# Patient Record
Sex: Female | Born: 1960 | Race: Black or African American | Hispanic: No | Marital: Single | Smoking: Former smoker
Health system: Southern US, Community
[De-identification: ages and names within clinical notes are randomized; demographics above are authoritative.]

## PROBLEM LIST (undated history)

## (undated) DIAGNOSIS — K219 Gastro-esophageal reflux disease without esophagitis: Secondary | ICD-10-CM

## (undated) DIAGNOSIS — M069 Rheumatoid arthritis, unspecified: Secondary | ICD-10-CM

## (undated) DIAGNOSIS — I82409 Acute embolism and thrombosis of unspecified deep veins of unspecified lower extremity: Secondary | ICD-10-CM

## (undated) DIAGNOSIS — K5900 Constipation, unspecified: Secondary | ICD-10-CM

## (undated) DIAGNOSIS — L209 Atopic dermatitis, unspecified: Secondary | ICD-10-CM

## (undated) DIAGNOSIS — M359 Systemic involvement of connective tissue, unspecified: Secondary | ICD-10-CM

## (undated) DIAGNOSIS — E785 Hyperlipidemia, unspecified: Secondary | ICD-10-CM

## (undated) DIAGNOSIS — J45909 Unspecified asthma, uncomplicated: Secondary | ICD-10-CM

## (undated) DIAGNOSIS — G709 Myoneural disorder, unspecified: Secondary | ICD-10-CM

## (undated) DIAGNOSIS — I10 Essential (primary) hypertension: Secondary | ICD-10-CM

## (undated) DIAGNOSIS — J449 Chronic obstructive pulmonary disease, unspecified: Secondary | ICD-10-CM

## (undated) DIAGNOSIS — E119 Type 2 diabetes mellitus without complications: Secondary | ICD-10-CM

## (undated) HISTORY — DX: Hyperlipidemia, unspecified: E78.5

## (undated) HISTORY — PX: TUBAL LIGATION: SHX77

## (undated) HISTORY — DX: Unspecified asthma, uncomplicated: J45.909

## (undated) HISTORY — DX: Gastro-esophageal reflux disease without esophagitis: K21.9

## (undated) HISTORY — DX: Rheumatoid arthritis, unspecified: M06.9

## (undated) HISTORY — DX: Constipation, unspecified: K59.00

## (undated) HISTORY — PX: OTHER SURGICAL HISTORY: SHX169

## (undated) HISTORY — DX: Atopic dermatitis, unspecified: L20.9

## (undated) HISTORY — PX: APPENDECTOMY: SHX54

---

## 2004-03-29 ENCOUNTER — Emergency Department: Payer: Self-pay | Admitting: Internal Medicine

## 2004-05-21 ENCOUNTER — Ambulatory Visit: Payer: Self-pay | Admitting: Surgery

## 2004-05-27 ENCOUNTER — Ambulatory Visit: Payer: Self-pay | Admitting: Surgery

## 2004-05-30 ENCOUNTER — Emergency Department: Payer: Self-pay | Admitting: Emergency Medicine

## 2005-05-04 ENCOUNTER — Ambulatory Visit: Payer: Self-pay | Admitting: Internal Medicine

## 2005-09-18 ENCOUNTER — Emergency Department: Payer: Self-pay | Admitting: General Practice

## 2005-10-09 ENCOUNTER — Emergency Department: Payer: Self-pay | Admitting: Emergency Medicine

## 2006-01-02 ENCOUNTER — Emergency Department: Payer: Self-pay | Admitting: Emergency Medicine

## 2007-01-11 ENCOUNTER — Ambulatory Visit: Payer: Self-pay | Admitting: Family Medicine

## 2007-03-06 ENCOUNTER — Emergency Department: Payer: Self-pay

## 2008-01-01 ENCOUNTER — Ambulatory Visit: Payer: Self-pay

## 2008-02-04 ENCOUNTER — Ambulatory Visit: Payer: Self-pay | Admitting: Internal Medicine

## 2008-03-03 ENCOUNTER — Emergency Department: Payer: Self-pay | Admitting: Emergency Medicine

## 2008-10-11 ENCOUNTER — Emergency Department: Payer: Self-pay | Admitting: Emergency Medicine

## 2009-08-17 DIAGNOSIS — Z79899 Other long term (current) drug therapy: Secondary | ICD-10-CM

## 2009-08-17 DIAGNOSIS — Z7901 Long term (current) use of anticoagulants: Secondary | ICD-10-CM | POA: Insufficient documentation

## 2010-06-22 DIAGNOSIS — F329 Major depressive disorder, single episode, unspecified: Secondary | ICD-10-CM | POA: Insufficient documentation

## 2010-06-22 DIAGNOSIS — E119 Type 2 diabetes mellitus without complications: Secondary | ICD-10-CM | POA: Insufficient documentation

## 2010-06-22 DIAGNOSIS — E1129 Type 2 diabetes mellitus with other diabetic kidney complication: Secondary | ICD-10-CM | POA: Insufficient documentation

## 2011-09-24 ENCOUNTER — Emergency Department: Payer: Self-pay | Admitting: Emergency Medicine

## 2011-10-04 ENCOUNTER — Inpatient Hospital Stay: Payer: Self-pay | Admitting: Internal Medicine

## 2011-10-04 LAB — BASIC METABOLIC PANEL
Anion Gap: 8 (ref 7–16)
BUN: 11 mg/dL (ref 7–18)
Calcium, Total: 8.7 mg/dL (ref 8.5–10.1)
Chloride: 107 mmol/L (ref 98–107)
Co2: 26 mmol/L (ref 21–32)
Creatinine: 0.75 mg/dL (ref 0.60–1.30)
Glucose: 92 mg/dL (ref 65–99)
Osmolality: 280 (ref 275–301)
Potassium: 3.7 mmol/L (ref 3.5–5.1)
Sodium: 141 mmol/L (ref 136–145)

## 2011-10-04 LAB — URINALYSIS, COMPLETE
Bacteria: NONE SEEN
Bilirubin,UR: NEGATIVE
Glucose,UR: NEGATIVE mg/dL (ref 0–75)
Ketone: NEGATIVE
Leukocyte Esterase: NEGATIVE
Nitrite: NEGATIVE
Ph: 5 (ref 4.5–8.0)
Protein: NEGATIVE
RBC,UR: 1 /HPF (ref 0–5)
Specific Gravity: 1.018 (ref 1.003–1.030)
Squamous Epithelial: 1
Transitional Epi: <1
WBC UR: <1 /HPF (ref 0–5)

## 2011-10-04 LAB — CBC
HCT: 39.4 % (ref 35.0–47.0)
HGB: 12.6 g/dL (ref 12.0–16.0)
MCH: 32.5 pg (ref 26.0–34.0)
MCHC: 32 g/dL (ref 32.0–36.0)
MCV: 102 fL — ABNORMAL HIGH (ref 80–100)
Platelet: 305 10*3/uL (ref 150–440)
RBC: 3.88 10*6/uL (ref 3.80–5.20)
RDW: 15 % — ABNORMAL HIGH (ref 11.5–14.5)
WBC: 13.2 10*3/uL — ABNORMAL HIGH (ref 3.6–11.0)

## 2011-10-04 LAB — TROPONIN I: Troponin-I: <0.02 ng/mL

## 2011-10-04 LAB — TSH: Thyroid Stimulating Horm: 1.71 u[IU]/mL

## 2011-10-04 LAB — PROTIME-INR
INR: 1.4
Prothrombin Time: 17.6 secs — ABNORMAL HIGH (ref 11.5–14.7)

## 2011-10-04 LAB — CK TOTAL AND CKMB (NOT AT ARMC)
CK, Total: 349 U/L — ABNORMAL HIGH (ref 21–215)
CK-MB: 2.2 ng/mL (ref 0.5–3.6)

## 2011-10-04 LAB — HEMOGLOBIN A1C: Hemoglobin A1C: 6.3 % (ref 4.2–6.3)

## 2011-10-05 LAB — COMPREHENSIVE METABOLIC PANEL
Albumin: 3.7 g/dL (ref 3.4–5.0)
Alkaline Phosphatase: 88 U/L (ref 50–136)
Anion Gap: 10 (ref 7–16)
BUN: 14 mg/dL (ref 7–18)
Bilirubin,Total: 0.2 mg/dL (ref 0.2–1.0)
Calcium, Total: 9.6 mg/dL (ref 8.5–10.1)
Chloride: 105 mmol/L (ref 98–107)
Co2: 23 mmol/L (ref 21–32)
Creatinine: 0.87 mg/dL (ref 0.60–1.30)
EGFR (African American): >60
EGFR (Non-African Amer.): >60
Glucose: 193 mg/dL — ABNORMAL HIGH (ref 65–99)
Osmolality: 281 (ref 275–301)
Potassium: 4.6 mmol/L (ref 3.5–5.1)
SGOT(AST): 26 U/L (ref 15–37)
SGPT (ALT): 41 U/L
Sodium: 138 mmol/L (ref 136–145)
Total Protein: 8 g/dL (ref 6.4–8.2)

## 2011-10-05 LAB — CBC WITH DIFFERENTIAL/PLATELET
Basophil #: 0 10*3/uL (ref 0.0–0.1)
Basophil %: 0.1 %
Eosinophil #: 0 10*3/uL (ref 0.0–0.7)
Eosinophil %: 0 %
HCT: 40.7 % (ref 35.0–47.0)
HGB: 13 g/dL (ref 12.0–16.0)
Lymphocyte #: 1.3 10*3/uL (ref 1.0–3.6)
Lymphocyte %: 10 %
MCH: 32.3 pg (ref 26.0–34.0)
MCHC: 32 g/dL (ref 32.0–36.0)
MCV: 101 fL — ABNORMAL HIGH (ref 80–100)
Monocyte #: 0.5 x10 3/mm (ref 0.2–0.9)
Monocyte %: 3.7 %
Neutrophil #: 11.5 10*3/uL — ABNORMAL HIGH (ref 1.4–6.5)
Neutrophil %: 86.2 %
Platelet: 317 10*3/uL (ref 150–440)
RBC: 4.04 10*6/uL (ref 3.80–5.20)
RDW: 15 % — ABNORMAL HIGH (ref 11.5–14.5)
WBC: 13.3 10*3/uL — ABNORMAL HIGH (ref 3.6–11.0)

## 2011-10-06 LAB — PROTIME-INR
INR: 1.5
Prothrombin Time: 18.3 secs — ABNORMAL HIGH (ref 11.5–14.7)

## 2011-10-10 LAB — CULTURE, BLOOD (SINGLE)

## 2012-01-11 DIAGNOSIS — L309 Dermatitis, unspecified: Secondary | ICD-10-CM | POA: Insufficient documentation

## 2012-03-11 ENCOUNTER — Emergency Department: Payer: Self-pay | Admitting: Emergency Medicine

## 2012-03-11 LAB — BASIC METABOLIC PANEL
Anion Gap: 7 (ref 7–16)
BUN: 9 mg/dL (ref 7–18)
Calcium, Total: 8.8 mg/dL (ref 8.5–10.1)
Chloride: 106 mmol/L (ref 98–107)
Co2: 26 mmol/L (ref 21–32)
Creatinine: 0.82 mg/dL (ref 0.60–1.30)
EGFR (African American): >60
EGFR (Non-African Amer.): >60
Glucose: 126 mg/dL — ABNORMAL HIGH (ref 65–99)
Osmolality: 278 (ref 275–301)
Potassium: 3.9 mmol/L (ref 3.5–5.1)
Sodium: 139 mmol/L (ref 136–145)

## 2012-03-11 LAB — CBC
HCT: 38.8 % (ref 35.0–47.0)
HGB: 12.7 g/dL (ref 12.0–16.0)
MCH: 31.7 pg (ref 26.0–34.0)
MCHC: 32.8 g/dL (ref 32.0–36.0)
MCV: 97 fL (ref 80–100)
Platelet: 288 10*3/uL (ref 150–440)
RBC: 4.02 10*6/uL (ref 3.80–5.20)
RDW: 15.3 % — ABNORMAL HIGH (ref 11.5–14.5)
WBC: 7.8 10*3/uL (ref 3.6–11.0)

## 2012-03-11 LAB — PRO B NATRIURETIC PEPTIDE: B-Type Natriuretic Peptide: 122 pg/mL (ref 0–125)

## 2012-04-02 DIAGNOSIS — F17200 Nicotine dependence, unspecified, uncomplicated: Secondary | ICD-10-CM | POA: Insufficient documentation

## 2012-04-10 DIAGNOSIS — J45909 Unspecified asthma, uncomplicated: Secondary | ICD-10-CM | POA: Insufficient documentation

## 2012-04-29 ENCOUNTER — Emergency Department: Payer: Self-pay | Admitting: Emergency Medicine

## 2012-04-29 LAB — RAPID INFLUENZA A&B ANTIGENS

## 2012-04-29 LAB — CBC
HCT: 36.8 % (ref 35.0–47.0)
HGB: 11.8 g/dL — ABNORMAL LOW (ref 12.0–16.0)
MCH: 30.9 pg (ref 26.0–34.0)
MCHC: 32.1 g/dL (ref 32.0–36.0)
MCV: 96 fL (ref 80–100)
Platelet: 320 10*3/uL (ref 150–440)
RBC: 3.82 10*6/uL (ref 3.80–5.20)
RDW: 13.7 % (ref 11.5–14.5)
WBC: 6 10*3/uL (ref 3.6–11.0)

## 2012-04-29 LAB — BASIC METABOLIC PANEL
Anion Gap: 10 (ref 7–16)
BUN: 16 mg/dL (ref 7–18)
Calcium, Total: 9.2 mg/dL (ref 8.5–10.1)
Chloride: 100 mmol/L (ref 98–107)
Co2: 28 mmol/L (ref 21–32)
Creatinine: 1.06 mg/dL (ref 0.60–1.30)
EGFR (African American): >60
EGFR (Non-African Amer.): >60
Glucose: 96 mg/dL (ref 65–99)
Osmolality: 277 (ref 275–301)
Potassium: 3.7 mmol/L (ref 3.5–5.1)
Sodium: 138 mmol/L (ref 136–145)

## 2012-04-29 LAB — CK TOTAL AND CKMB (NOT AT ARMC)
CK, Total: 239 U/L — ABNORMAL HIGH (ref 21–215)
CK-MB: 0.8 ng/mL (ref 0.5–3.6)

## 2012-04-29 LAB — TROPONIN I: Troponin-I: <0.02 ng/mL

## 2012-05-05 LAB — CULTURE, BLOOD (SINGLE)

## 2012-08-16 DIAGNOSIS — K5900 Constipation, unspecified: Secondary | ICD-10-CM | POA: Insufficient documentation

## 2012-08-16 DIAGNOSIS — K219 Gastro-esophageal reflux disease without esophagitis: Secondary | ICD-10-CM | POA: Insufficient documentation

## 2012-08-31 DIAGNOSIS — I82409 Acute embolism and thrombosis of unspecified deep veins of unspecified lower extremity: Secondary | ICD-10-CM | POA: Insufficient documentation

## 2012-09-12 DIAGNOSIS — L709 Acne, unspecified: Secondary | ICD-10-CM | POA: Insufficient documentation

## 2012-09-12 DIAGNOSIS — L309 Dermatitis, unspecified: Secondary | ICD-10-CM | POA: Insufficient documentation

## 2012-11-25 ENCOUNTER — Emergency Department: Payer: Self-pay | Admitting: Emergency Medicine

## 2012-11-25 LAB — COMPREHENSIVE METABOLIC PANEL
Albumin: 3.5 g/dL (ref 3.4–5.0)
Alkaline Phosphatase: 98 U/L (ref 50–136)
Anion Gap: 6 — ABNORMAL LOW (ref 7–16)
BUN: 19 mg/dL — ABNORMAL HIGH (ref 7–18)
Bilirubin,Total: 0.2 mg/dL (ref 0.2–1.0)
Calcium, Total: 9.3 mg/dL (ref 8.5–10.1)
Chloride: 102 mmol/L (ref 98–107)
Co2: 29 mmol/L (ref 21–32)
Creatinine: 1.22 mg/dL (ref 0.60–1.30)
EGFR (African American): 59 — ABNORMAL LOW
EGFR (Non-African Amer.): 51 — ABNORMAL LOW
Glucose: 79 mg/dL (ref 65–99)
Osmolality: 275 (ref 275–301)
Potassium: 3.7 mmol/L (ref 3.5–5.1)
SGOT(AST): 50 U/L — ABNORMAL HIGH (ref 15–37)
SGPT (ALT): 63 U/L (ref 12–78)
Sodium: 137 mmol/L (ref 136–145)
Total Protein: 7.4 g/dL (ref 6.4–8.2)

## 2012-11-25 LAB — CBC
HCT: 35.5 % (ref 35.0–47.0)
HGB: 11.9 g/dL — ABNORMAL LOW (ref 12.0–16.0)
MCH: 32.6 pg (ref 26.0–34.0)
MCHC: 33.5 g/dL (ref 32.0–36.0)
MCV: 97 fL (ref 80–100)
Platelet: 384 10*3/uL (ref 150–440)
RBC: 3.66 10*6/uL — ABNORMAL LOW (ref 3.80–5.20)
RDW: 15.7 % — ABNORMAL HIGH (ref 11.5–14.5)
WBC: 8.2 10*3/uL (ref 3.6–11.0)

## 2012-11-25 LAB — TROPONIN I: Troponin-I: <0.02 ng/mL

## 2013-05-01 DIAGNOSIS — J441 Chronic obstructive pulmonary disease with (acute) exacerbation: Secondary | ICD-10-CM | POA: Insufficient documentation

## 2013-05-01 DIAGNOSIS — J449 Chronic obstructive pulmonary disease, unspecified: Secondary | ICD-10-CM | POA: Insufficient documentation

## 2013-05-01 DIAGNOSIS — J069 Acute upper respiratory infection, unspecified: Secondary | ICD-10-CM | POA: Insufficient documentation

## 2014-03-03 ENCOUNTER — Emergency Department: Payer: Self-pay | Admitting: Emergency Medicine

## 2014-03-18 DIAGNOSIS — M545 Low back pain, unspecified: Secondary | ICD-10-CM | POA: Insufficient documentation

## 2014-05-07 ENCOUNTER — Inpatient Hospital Stay: Payer: Self-pay | Admitting: Specialist

## 2014-05-07 LAB — TROPONIN I: Troponin-I: <0.02 ng/mL

## 2014-05-07 LAB — CBC
HCT: 37.4 % (ref 35.0–47.0)
HGB: 12 g/dL (ref 12.0–16.0)
MCH: 31.4 pg (ref 26.0–34.0)
MCHC: 32.1 g/dL (ref 32.0–36.0)
MCV: 98 fL (ref 80–100)
Platelet: 322 10*3/uL (ref 150–440)
RBC: 3.81 10*6/uL (ref 3.80–5.20)
RDW: 14.3 % (ref 11.5–14.5)
WBC: 14.1 10*3/uL — ABNORMAL HIGH (ref 3.6–11.0)

## 2014-05-07 LAB — BASIC METABOLIC PANEL
Anion Gap: 10 (ref 7–16)
BUN: 20 mg/dL — ABNORMAL HIGH (ref 7–18)
Calcium, Total: 8.5 mg/dL (ref 8.5–10.1)
Chloride: 101 mmol/L (ref 98–107)
Co2: 23 mmol/L (ref 21–32)
Creatinine: 1.11 mg/dL (ref 0.60–1.30)
EGFR (African American): >60
EGFR (Non-African Amer.): 55 — ABNORMAL LOW
Glucose: 154 mg/dL — ABNORMAL HIGH (ref 65–99)
Osmolality: 274 (ref 275–301)
Potassium: 3.9 mmol/L (ref 3.5–5.1)
Sodium: 134 mmol/L — ABNORMAL LOW (ref 136–145)

## 2014-05-07 LAB — APTT: Activated PTT: 33.4 secs (ref 23.6–35.9)

## 2014-05-07 LAB — PROTIME-INR
INR: 1.7
Prothrombin Time: 19.7 secs — ABNORMAL HIGH (ref 11.5–14.7)

## 2014-05-08 LAB — CBC WITH DIFFERENTIAL/PLATELET
Basophil #: 0 10*3/uL (ref 0.0–0.1)
Basophil %: 0.1 %
Eosinophil #: 0 10*3/uL (ref 0.0–0.7)
Eosinophil %: 0 %
HCT: 37 % (ref 35.0–47.0)
HGB: 11.8 g/dL — ABNORMAL LOW (ref 12.0–16.0)
Lymphocyte #: 1.3 10*3/uL (ref 1.0–3.6)
Lymphocyte %: 6.9 %
MCH: 31.3 pg (ref 26.0–34.0)
MCHC: 31.9 g/dL — ABNORMAL LOW (ref 32.0–36.0)
MCV: 98 fL (ref 80–100)
Monocyte #: 0.8 x10 3/mm (ref 0.2–0.9)
Monocyte %: 4.3 %
Neutrophil #: 17 10*3/uL — ABNORMAL HIGH (ref 1.4–6.5)
Neutrophil %: 88.7 %
Platelet: 330 10*3/uL (ref 150–440)
RBC: 3.77 10*6/uL — ABNORMAL LOW (ref 3.80–5.20)
RDW: 14.4 % (ref 11.5–14.5)
WBC: 19.1 10*3/uL — ABNORMAL HIGH (ref 3.6–11.0)

## 2014-05-08 LAB — BASIC METABOLIC PANEL
Anion Gap: 8 (ref 7–16)
BUN: 29 mg/dL — ABNORMAL HIGH (ref 7–18)
Calcium, Total: 8.8 mg/dL (ref 8.5–10.1)
Chloride: 102 mmol/L (ref 98–107)
Co2: 26 mmol/L (ref 21–32)
Creatinine: 1.24 mg/dL (ref 0.60–1.30)
EGFR (African American): 58 — ABNORMAL LOW
EGFR (Non-African Amer.): 48 — ABNORMAL LOW
Glucose: 183 mg/dL — ABNORMAL HIGH (ref 65–99)
Osmolality: 282 (ref 275–301)
Potassium: 4.4 mmol/L (ref 3.5–5.1)
Sodium: 136 mmol/L (ref 136–145)

## 2014-05-08 LAB — HEMOGLOBIN A1C: Hemoglobin A1C: 7.2 % — ABNORMAL HIGH (ref 4.2–6.3)

## 2014-05-08 LAB — PROTIME-INR
INR: 2
Prothrombin Time: 22.3 secs — ABNORMAL HIGH (ref 11.5–14.7)

## 2014-05-09 LAB — CBC WITH DIFFERENTIAL/PLATELET
Basophil #: 0.1 10*3/uL (ref 0.0–0.1)
Basophil %: 0.3 %
Eosinophil #: 0 10*3/uL (ref 0.0–0.7)
Eosinophil %: 0.1 %
HCT: 38.5 % (ref 35.0–47.0)
HGB: 12.3 g/dL (ref 12.0–16.0)
Lymphocyte #: 4.1 10*3/uL — ABNORMAL HIGH (ref 1.0–3.6)
Lymphocyte %: 18.5 %
MCH: 31.2 pg (ref 26.0–34.0)
MCHC: 31.9 g/dL — ABNORMAL LOW (ref 32.0–36.0)
MCV: 98 fL (ref 80–100)
Monocyte #: 1.8 x10 3/mm — ABNORMAL HIGH (ref 0.2–0.9)
Monocyte %: 8.4 %
Neutrophil #: 16 10*3/uL — ABNORMAL HIGH (ref 1.4–6.5)
Neutrophil %: 72.7 %
Platelet: 333 10*3/uL (ref 150–440)
RBC: 3.94 10*6/uL (ref 3.80–5.20)
RDW: 14.4 % (ref 11.5–14.5)
WBC: 22 10*3/uL — ABNORMAL HIGH (ref 3.6–11.0)

## 2014-05-09 LAB — PROTIME-INR
INR: 2.3
Prothrombin Time: 25 secs — ABNORMAL HIGH (ref 11.5–14.7)

## 2014-05-12 LAB — CULTURE, BLOOD (SINGLE)

## 2014-06-12 ENCOUNTER — Encounter: Payer: Self-pay | Admitting: Pain Medicine

## 2014-07-01 ENCOUNTER — Encounter: Payer: Self-pay | Admitting: Pain Medicine

## 2014-07-14 DIAGNOSIS — M25562 Pain in left knee: Secondary | ICD-10-CM

## 2014-07-14 DIAGNOSIS — G8929 Other chronic pain: Secondary | ICD-10-CM | POA: Insufficient documentation

## 2014-07-14 DIAGNOSIS — M7918 Myalgia, other site: Secondary | ICD-10-CM | POA: Insufficient documentation

## 2014-08-24 NOTE — H&P (Signed)
PATIENT NAME:  Brenda Santana, Brenda Santana MR#:  [id number] DATE OF BIRTH:  [birth year]  DATE OF ADMISSION:  10/04/2011  ADDENDUM:   PRIMARY CARE PHYSICIAN: [facility]   MEDICATIONS: The patient's medication list is as follows.  1. Iron sulfate 325 mg p.o. daily. 2. Lisinopril 10 mg p.o. daily.  3. Metformin 1 gram p.o. b.i.d. 4. Metoprolol 50 mg p.o. b.i.d. 5. Omeprazole 10 mg p.o. daily. 6. Warfarin 5 mg p.o., 2 tablets on Monday and 7.5 mg tablet other days.   ____________________________ Theodoro Grist, MD rv:cbb D: 10/04/2011 15:14:32 ET T: 10/04/2011 15:20:42 ET JOB#: [id number]  cc: Theodoro Grist, MD, <Dictator> [facility] Kadir Azucena MD ELECTRONICALLY SIGNED 10/04/2011 19:10

## 2014-08-24 NOTE — H&P (Signed)
PATIENT NAME:  Brenda Santana, Brenda Santana MR#:  [id number] DATE OF BIRTH:  Aug 16, 1960  DATE OF ADMISSION:  10/04/2011  PRIMARY CARE PHYSICIAN: [facility]   HISTORY OF PRESENT ILLNESS: Patient is a 54 year old African American female with past medical history significant for history of chronic obstructive pulmonary disease diagnosed approximately three years ago, history of diabetes, hypertension, history of right upper extremity arterial clot on Coumadin therapy, also history of depression who presented to hospital with complaints of shortness of breath. Apparently patient was doing well up until approximately at least two weeks ago when she started having increasing shortness of breath. She stated that she has been short of breath and she cannot even lie down, she has to sit up. She feels rattling in her chest. She has wheezing, coughing, however, does not produce much phlegm. Her phlegm is usually thin and watery, more frothy. She also admits of left lower extremity swelling on and off for at least one week now. She denies any significant chest pain, however, admits of some chest pains as well as abdominal pains whenever she coughs. She states that she cannot even lay down, she chokes or coughs whenever she tries to lay down. She admits of seeing [facility] Emergency Room or primary care physician on Friday where she was given prednisone taper, however, did not improve significantly and presented back to Emergency Room at [facility] here with shortness of breath. In the Emergency Room she was given thirty minute therapy with nebulizers with no significant improvement and hospitalist services were contacted for admission.   PAST MEDICAL HISTORY:  1. History of chronic obstructive pulmonary disease diagnosed approximately three years ago. 2. History of diabetes mellitus. 3. Hypertension. 4. 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   5. History of  depression.  6. History of stable angina status post cardiac catheterization done in 2005 by Dr. Clayborn Bigness. At that time cardiac catheterization revealed normal coronary arteries. Ejection fraction of 65%.   MEDICATIONS:  1. Apparently patient was given some Percocet 7.5 mg/325 mg 1 tablet every six hours as needed.  2. Methocarbamol 750 mg 2 tablets 3 times daily. 3. Ibuprofen 800 mg 3 times daily for some neck discomfort recently.  4. Other medications are unknown.   PAST SURGICAL HISTORY: Right upper extremity deep vein thrombosis removal.   ALLERGIES: None.   FAMILY HISTORY: Hypertension in patient's multiple family members, coronary artery disease in patient's father who also died of congestive heart failure at age of 44, diabetes mellitus in patient's grandfather as well as father. No cancer.   SOCIAL HISTORY: Patient is single, has two children, 42 as well as 42 years old. Used to smoke 1/3 of pack a day since age 5 for 30 years, quit two weeks ago because of shortness of breath. She has been using also cocaine, quit also two weeks ago. She works as Quarry manager. Does not drink any alcohol.   REVIEW OF SYSTEMS: CONSTITUTIONAL: Positive for weight gain, approximately 30 or 40 pounds in the past six months. Some congestion, sinus congestion, coughing chest pains, abdominal pains with cough, five pillow orthopnea, lower extremity swelling, dyspnea on exertion, cough and wheezing and shortness of breath, palpitations in the chest, also constipation. Denies any fevers, chills, fatigue, weakness, pains except as mentioned above or weight loss. EYES: In regards to eyes denies any blurry vision, double vision, glaucoma, cataracts. ENT: Denies any tinnitus, allergies, epistaxis, sinus pain, dentures, difficulty swallowing. RESPIRATORY: Denies any hemoptysis, asthma,  chronic obstructive pulmonary disease. CARDIOVASCULAR: Denies any chest pains, arrhythmias, syncope. GASTROINTESTINAL: Denies any nausea,  vomiting, diarrhea, rectal bleeding, change in bowel habits. GENITOURINARY: Denies dysuria, hematuria, frequency, incontinence. ENDOCRINOLOGY: Denies any polydipsia, nocturia, thyroid problems, heat or cold intolerance or thirst. HEMATOLOGY: Denies any anemia, easy bruising, bleeding, swollen glands. SKIN: Denies any acne, rashes, lesions, change in moles. MUSCULOSKELETAL: Denies arthritis, cramps, swelling, gout. NEUROLOGIC: No numbness, epilepsy, tremor. PSYCHIATRIC: Denies anxiety, insomnia, depression.   PHYSICAL EXAMINATION:  VITAL SIGNS: On arrival to the hospital: Temperature 97, pulse 71, respiration rate 24, blood pressure 125/80, saturation 100% on room air.   GENERAL: This is a well-nourished, obese African American female in moderate to severe respiratory distress. She is able to sit only upright even leaning forward because of significant shortness of breath.    HEENT: Her pupils are equal, reactive to light. Extraocular movements are intact. No icterus or conjunctivitis. Has normal hearing. No pharyngeal erythema. Mucosa is moist.   NECK: No masses. Thyroid not enlarged. No adenopathy or JVD or carotid bruits bilaterally. Full range of motion.   LUNGS: Clear to auscultation anteriorly though severely diminished breath sounds. Few rhonchi as well as rales were heard, however, mildly diminished breath sounds posteriorly. Labored inspirations as well as wheezing bilaterally and rales and rhonchi as well as increased effort to breathe but no dullness to percussion in moderate respiratory distress especially posteriorly noted.   CARDIOVASCULAR: S1, S2 appreciated. No murmurs, gallops, rubs noted. Distant. PMI not lateralized. Chest is nontender to palpation.   EXTREMITIES: 1+ pedal pulses. 1 to 2+ lower extremity edema bilaterally. No calf tenderness or cyanosis was noted.   ABDOMEN: Soft, protuberant, nontender. Bowel sounds are present. No hepatosplenomegaly or masses were noted.    RECTAL: Deferred.   MUSCULOSKELETAL: Able to move all extremities. No cyanosis, degenerative joint disease, or kyphosis. Gait is not tested.   SKIN: Skin did not reveal any rashes, lesions, erythema. Few papular lesions were noted in her arms. Skin otherwise was warm and dry to palpation.   LYMPH: No adenopathy in cervical region.   NEUROLOGICAL: Cranial nerves grossly intact. Sensory is intact. No dysarthria, aphasia.   PSYCH: Patient is alert, oriented to time, person, place, cooperative. Memory is good. No syndrome confusion, agitation, or depression noted.   LABORATORY, DIAGNOSTIC, AND RADIOLOGICAL DATA: BMP was within normal limits. Cardiac enzymes CK total was 349, otherwise unremarkable. White blood cell count was elevated to 13.2, hemoglobin 12.6, platelet count 305. MCV is high at 102. EKG showed normal sinus rhythm at 73 beats per minute, normal axis, no acute ST-T changes. Chest x-ray according to radiologist PA and lateral 10/04/2011: No acute changes identified. There is mild cardiomegaly, minimal discoid atelectasis on the left was noted.   ASSESSMENT AND PLAN:  1. COPD  exacerbation. Admit patient to medical floor. Continue Solu-Medrol as well as Levaquin, inhalation therapy as well as Advair and tiotropium. Follow patient clinically.  2. Acute bronchitis. Continue Levaquin. Get sputum cultures as well as blood cultures.  3. Lower extremity swelling, questionable right-sided heart failure. Get ultrasound of her lower extremities to rule out deep vein thrombosis, however, patient is on Coumadin therapy so will get pro time as well as INR. Patient would benefit, however, to evaluate patient for nocturnal oximetry to rule out obstructive sleep apnea.  4. Cocaine abuse. Get echocardiogram to rule out cardiomyopathy.  5. Tobacco abuse. Patient states quit two weeks ago, however, her quitting coincides with worsening shortness of breath. I am concerned  that in fact she quit because  she was short of breath and she will have problems with nicotine withdrawal as soon as her shortness of breath improves so will be starting nicotine replacement therapy. That was discussed with patient for approximately five minutes.  6. Diabetes mellitus. Will restart her on outpatient medications. Meanwhile will continue sliding scale insulin.  7. History of hypertension. Will start patient on lisinopril as well as metoprolol while she is in the hospital, advance them as necessary.   TIME SPENT: 50 minutes.   ____________________________ Theodoro Grist, MD rv:cms D: 10/04/2011 14:32:57 ET T: 10/04/2011 15:36:19 ET JOB#: [id number]  cc: Theodoro Grist, MD, <Dictator> [facility] Internal Medicine  Kendre Sires MD ELECTRONICALLY SIGNED 10/04/2011 19:11

## 2014-08-24 NOTE — Discharge Summary (Signed)
PATIENT NAME:  Brenda Santana, Brenda Santana MR#:  [id number] DATE OF BIRTH:  Sep 03, 1960  DATE OF ADMISSION:  10/04/2011 DATE OF DISCHARGE:  10/06/2011  PRESENTING COMPLAINT: Shortness of breath.   DISCHARGE DIAGNOSES:  1. Chronic obstructive pulmonary disease exacerbation.  2. Hypertension.  3. Tobacco abuse.   CONDITION ON DISCHARGE: Fair. Sats 98% on room air.   MEDICATIONS:  1. Ferrous sulfate 325 p.o. daily.  2. Omeprazole 20 mg daily.  3. Metoprolol 50 mg b.i.d.  4. Lisinopril 10 mg daily.  5. Metformin 1000 mg b.i.d.  6. Warfarin 10 mg every Monday at 5 p.m.  7. Warfarin 7.5 mg on Tuesday, Wednesday, Thursday, Friday, and Saturday at 5 p.m.  8. Prednisone 50 mg daily, taper by 10 mg daily, then stop.  9. Advair 500/50 1 puff b.i.d.  10. Tiotropium 18 mcg inhalation 1 capsule inhalation daily.  11. Levaquin 500 mg p.o. daily for five days.   FOLLOW-UP: Follow-up with [facility] primary care physician in 1 to 2 weeks.   LABORATORY, DIAGNOSTIC, AND RADIOLOGICAL DATA: White count 13.3, hemoglobin and hematocrit 13 and 40.7, platelet count 317. Comprehensive metabolic panel within normal limits except glucose of 193.   Ultrasound Doppler lower extremities bilaterally negative for DVT.   Urinalysis negative for urinary tract infection. Blood cultures no growth in 48 hours.   Chest x-ray no acute changes identified on admission.   Cardiac enzymes negative. Hemoglobin A1c 6.3. TSH 1.7.   BRIEF SUMMARY OF HOSPITAL COURSE: [name] is a 55 year old African American female who was admitted with: 1. Acute COPD exacerbation. She was started on high dose IV Solu-Medrol, empiric p.o. Levaquin, nebulizer treatments along with her oral inhalers. She improved clinically over the hospital stay. She will finish up the steroids as a p.o. taper along with antibiotics. Sats on room air were 98% prior to discharge.  2. Acute bronchitis. Blood cultures were negative. Chest x-ray negative for pneumonia. Levaquin  was prescribed.  3. Lower extremity swelling which appeared stable. No DVT per Doppler.  4. History of substance abuse, cocaine x1. The patient advised to stop. She is agreeable to it.  5. Tobacco abuse. The patient was advised to quit smoking. The patient had quit smoking about two weeks ago.  6. Type II diabetes. The patient was continued on her metformin. Her A1c is 6.3.  7. History of right upper extremity blood clot status post thrombectomy, on Coumadin. The patient's Coumadin was continued according to her home dose. Her PT-INR were therapeutic.   Hospital stay otherwise remained stable.   CODE STATUS: The patient remained a FULL CODE.   TIME SPENT: 40 minutes.   ____________________________ Hart Rochester Posey Pronto, MD sap:drc D: 10/08/2011 13:52:26 ET T: 10/10/2011 11:36:46 ET JOB#: [id number]  cc: Corry Storie A. Posey Pronto, MD, <Dictator> [facility] Internal Medicine Ilda Basset MD ELECTRONICALLY SIGNED 10/22/2011 15:40

## 2014-08-31 NOTE — H&P (Signed)
PATIENT NAME:  Brenda Santana, Brenda Santana MR#:  [id number] DATE OF BIRTH:  08-19-1960  DATE OF ADMISSION:  05/07/2014  PRIMARY CARE PHYSICIAN:  Dr. Mayer Masker at [facility].  REFERRING EMERGENCY ROOM PHYSICIAN:  Larae Grooms, MD    CHIEF COMPLAINT: Shortness of breath and cough.   HISTORY OF PRESENT ILLNESS: The patient is a 54 year old African American female who came into the ED with a chief complaint of worsening of shortness of breath. The patient has been having productive cough and shortness of breath for more than 3 days and was evaluated by [facility] and was admitted there overnight for acute bronchitis. The patient was discharged home the next day with p.o. prednisone. The patient denies any fever, but still feeling short of breath.  She has been progressively getting worse. The patient came in to the ED here.  Her chest x-ray revealed left lower lobe pneumonia.  She was given IV Rocephin and Zithromax by the ED physician and the hospitalist team is contacted to admit the patient.  During my examination, the patient is feeling slightly better, but still short of breath with minimal exertion. Denies any chest pain. No family members at bedside.   PAST MEDICAL HISTORY: Hypertension, diabetes mellitus, left upper extremity deep vein thrombosis on Coumadin, COPD, obesity, history of depression, history of angina status post cardiac catheterization in 2005 by Dr. Clayborn Bigness, ejection fraction of 65%.   PAST SURGICAL HISTORY: Right upper extremity DVT removal, tubal ligation.   ALLERGIES: No known drug allergies.   PSYCHOSOCIAL HISTORY: Lives at home with her daughter. She used to smoke, but quit smoking several years ago. Denies alcohol or illicit drug usage.   FAMILY HISTORY: Diabetes and hypertension runs in her family.   REVIEW OF SYSTEMS:   CONSTITUTIONAL:  Denies any fever. Complaining of fatigue and weakness.  EYES: Denies blurry vision, double vision.  ENT: Denies epistaxis,  discharge, has been complaining of cough which is productive, chronic history of COPD, denies any asthma.  CARDIOVASCULAR: No chest or palpitations.  GASTROINTESTINAL: Denies nausea, vomiting, diarrhea, abdominal pain.  GENITOURINARY: No dysuria, hematuria.  ENDOCRINE:  Denies polyuria, nocturia, thyroid problems.  HEMATOLOGIC AND LYMPHATIC: No anemia, easy bruising, bleeding.  Left upper extremity DVT on Coumadin.  INTEGUMENTARY:  No acne, rash, denies lesions.  MUSCULOSKELETAL: No joint pain in the neck and back.  NEUROLOGIC: Denies vertigo, ataxia.  PSYCHIATRIC:  History of depression. Denies any suicidal or homicidal ideation.   HOME MEDICATIONS: Coumadin 5 mg 1-1/2 tablets p.o. once daily, tramadol 50 mg p.o. 1 to 2 tablets every 4 to 6 hours as needed, pregabalin 50 mg 1 capsule 2 times a day, prednisone 20 mg 2 tablets p.o. once daily, nystatin topical to the affected area, naproxen 500 mg 1 tablet p.o. b.i.d., metformin 1000 mg p.o. b.i.d., lisinopril 10 mg p.o. once daily, diclofenac apply topically to the affected area, Chlorthalidone 25 mg 1 tablet p.o. once daily.  PHYSICAL EXAMINATION:  VITAL SIGNS:  Temperature 97.1, pulse 79, respirations 18, blood pressure 118/64, pulse oximetry 93%.  GENERAL APPEARANCE: Not in acute distress, morbidly obese.  HEENT: Normocephalic, atraumatic. Pupils are equally reacting to light and accommodation. No scleral icterus. No conjunctival injection. No sinus tenderness. No postnasal drip.  NECK: Supple. No JVD. No thyromegaly. Range of motion is intact.  LUNGS: Bilateral wheezing is present. Positive crackles in the left lower lobe, no accessory muscle usage.  CARDIOVASCULAR: S1 S2 normal, regular rate and rhythm.  No murmurs. GASTROINTESTINAL: Soft.  Bowel sounds are positive in all 4 quadrants. Nontender, nondistended, obese, no masses.  NEUROLOGIC: Awake, alert and oriented x 3. Cranial nerves II through XII are grossly intact. Motor and sensory  are intact. Reflexes are 2+. EXTREMITIES: No edema. No cyanosis. No clubbing. SKIN: Warm to touch. Normal turgor. No rashes. No lesions.  MUSCULOSKELETAL: No joint effusion, tenderness, or edema.  PSYCHIATRIC:  Normal mood and affect.   LABORATORY AND IMAGING STUDIES: Troponin less than 0.02. WBC 14.1, hemoglobin, hematocrit, platelets are normal. PT is 19.7, INR 1.7.  PTT is normal. Glucose 154, BUN 20, creatinine 1.11, sodium 134, potassium 3.9, chloride 101, CO2 of 23, anion gap is 10, GFR greater than 60. Serum osmolality, calcium are normal. A 12-lead EKG: Normal sinus rhythm at 74 beats per minute, unlikely acute ST-T wave changes. Chest x-ray PA and lateral views, streaky left lower lobe opacity suspicious for pneumonia, recommend chest radiograph follow up in several weeks to acess resolution.   ASSESSMENT AND PLAN: An 54 year old African American female presenting to the ED with a chief complaint of a 3-day history of shortness of breath and productive cough evaluated by [facility], got admitted there with  acute bronchitis, was treated for 1 day and was discharged home with p.o. prednisone.  The patient is still having worsening of shortness of breath which prompted her to come to the Emergency Department here. In the Emergency Department at [facility], chest x-ray revealed a left lower lobe pneumonia. The patient was given IV Solu-Medrol, antibiotics and hospitalist team is called to admit the patient.  1.  Acute respiratory distress secondary to left lower lobe pneumonia with acute bronchitis. Will admit her to medical surgical unit. Will treat her with IV antibiotics.  Sputum cultures were ordered.  We will provide her DuoNeb treatments. 2.  Acute exacerbation of chronic obstructive pulmonary disease with bronchitis. Will treat her with IV Solu-Medrol 60 mg every 6 hours, 125 mg IV was given in the Emergency Department. We will provide her nebulizer treatments, DuoNebs and  also will give her Flonase for nasal congestion.  3.  Diabetes mellitus, patient will be on metformin, which is her home medication and also sliding-scale insulin for steroid-induced hyperglycemia.  4.  Hypertension. Resume her home medications, including lisinopril and hydrochlorothiazide, titrate medication as needed basis.  5.  History of left upper extremity deep vein thrombosis, status post resection, also on Coumadin. Apparently, INR is at 1.7 which is subtherapeutic.  We will resume Coumadin dose and resume daily PT, INRs and titrate coumadin as needed basis.  6.  Chronic history of depression.  Denies any suicidal ideations or thoughts at this point, but not acutely depressed.  We will resume her home medication.  7.  We will provide her deep vein thrombosis prophylaxis.    Plan of care and diagnosis was discussed in detail with the patient. She verbalized understanding of the plan.   TOTAL TIME SPENT ADMISSION: 45 minutes.   CODE STATUS:  She is FULL CODE.  Mom is the medical power of attorney.     ____________________________ Nicholes Mango, MD ag:DT D: 05/07/2014 14:11:43 ET T: 05/07/2014 15:35:48 ET JOB#: [id number]  cc: Nicholes Mango, MD, <Dictator> Nicholes Mango MD ELECTRONICALLY SIGNED 05/12/2014 23:03

## 2014-08-31 NOTE — Discharge Summary (Signed)
PATIENT NAME:  Brenda Santana, Brenda Santana MR#:  [id number] DATE OF BIRTH:  09-15-1960  DATE OF ADMISSION:  05/07/2014 DATE OF DISCHARGE:  05/09/2014  For a detailed note, please see the history and physical done on admission per Dr. Margaretmary Eddy.   DIAGNOSIS AT DISCHARGE AS FOLLOWS: Chronic obstructive pulmonary disease exacerbation, secondary to pneumonia; pneumonia, likely community acquired; diabetes, hypertension, osteoarthritis, hyperlipidemia.   The patient is being discharged on a low-sodium, low-fat, carb-controlled diet; activities as tolerated, and follow-up with her primary care physician at [facility].   DISCHARGE MEDICATIONS: Metoprolol tartrate 50 mg b.i.d., lisinopril 10 mg daily, metformin 1000 mg b.i.d., albuterol inhaler 2 puffs 4 times daily as needed, Anusol suppository daily as needed, bupropion 200 mg b.i.d., chlorthalidone 25 mg daily, clobetasol 0.05% topical cream applied b.i.d., diclofenac 1% topical gel to be applied q.i.d., Lyrica 50 mg b.i.d., tramadol 50 mg 1-2 tabs q. 4-6 hours as needed, tretinoin topical cream to be applied to the affected area at bedtime, Naprosyn 5 mg b.i.d., nystatin topically to be applied q.i.d. as needed, warfarin 5 mg 1-1/2 tablets daily, prednisone taper starting at 50 mg and down to 10 mg in the next 5 days, Levaquin 5 mg daily x 5 days, and Tylenol with hydrocodone 1 tablet q. 4 hours as needed.   PERTINENT STUDIES DONE DURING THE HOSPITAL COURSE: Chest x-ray done on admission showing streaky left lower lobe opacity, suspicious for pneumonia.   HOSPITAL COURSE: This is a 54 year old female with medical problems as mentioned above, who presented to the hospital with shortness of breath and cough, and noted to be in COPD exacerbation.  1.  COPD exacerbation. This would likely cause of patient's shortness of breath and cough, and is likely secondary to pneumonia. The patient apparently had failed outpatient therapy with oral Zithromax and prednisone taper. Therefore,  was admitted to the hospital, started on IV steroids, around-the-clock nebulizer treatments. Also started on IV ceftriaxone and Zithromax. The patient's clinical symptoms, after getting aggressive therapy has significantly improved. She has less wheezing and bronchospasm now. She was ambulated on room air, and did not desaturate below 95%. Clinically feels better. Therefore, being discharged home on an oral prednisone taper along with Levaquin as stated.  2.  Pneumonia. This was likely community-acquired, as seen on the chest x-ray on admission. Her blood cultures have remained negative. She was treated with IV ceftriaxone and Zithromax. She is currently being discharged on oral Levaquin.  3.  History of DVT. The patient was maintained on her Coumadin. Her INR is therapeutic. She will continue that.  4.  Diabetes. The patient was maintained on metformin and sliding scale insulin. She will continue her metformin upon discharge.  5.  Leukocytosis. Her white cell count was significantly elevated on discharge, but I suspect this is all steroid mediated. This further needs to be followed as an outpatient.  6.  Diabetic neuropathy. The patient will continue her Lyrica.  7.  Osteoarthritis. The patient will continue with diclofenac and Naprosyn.   CODE STATUS: The patient is a Full Code.   TIME SPENT: 40 minutes.   ____________________________ Belia Heman. Verdell Carmine, MD vjs:MT D: 05/09/2014 16:26:29 ET T: 05/09/2014 16:52:11 ET JOB#: [id number]  cc: Belia Heman. Verdell Carmine, MD, <Dictator> PCP at [facility] MD ELECTRONICALLY SIGNED 05/27/2014 10:40

## 2015-06-07 ENCOUNTER — Emergency Department
Admission: EM | Admit: 2015-06-07 | Discharge: 2015-06-07 | Disposition: A | Discharge status: Home / Self Care | Payer: Medicare Other | Attending: Emergency Medicine | Admitting: Emergency Medicine

## 2015-06-07 ENCOUNTER — Encounter: Payer: Self-pay | Admitting: Emergency Medicine

## 2015-06-07 DIAGNOSIS — R040 Epistaxis: Secondary | ICD-10-CM | POA: Diagnosis present

## 2015-06-07 DIAGNOSIS — E119 Type 2 diabetes mellitus without complications: Secondary | ICD-10-CM | POA: Insufficient documentation

## 2015-06-07 DIAGNOSIS — I10 Essential (primary) hypertension: Secondary | ICD-10-CM | POA: Diagnosis not present

## 2015-06-07 DIAGNOSIS — Z87891 Personal history of nicotine dependence: Secondary | ICD-10-CM | POA: Insufficient documentation

## 2015-06-07 HISTORY — DX: Chronic obstructive pulmonary disease, unspecified: J44.9

## 2015-06-07 HISTORY — DX: Type 2 diabetes mellitus without complications: E11.9

## 2015-06-07 HISTORY — DX: Essential (primary) hypertension: I10

## 2015-06-07 LAB — CBC WITH DIFFERENTIAL/PLATELET
Basophils Absolute: 0 10*3/uL (ref 0–0.1)
Basophils Relative: 1 %
Eosinophils Absolute: 0.4 10*3/uL (ref 0–0.7)
Eosinophils Relative: 4 %
HCT: 38.1 % (ref 35.0–47.0)
Hemoglobin: 12.7 g/dL (ref 12.0–16.0)
Lymphocytes Relative: 38 %
Lymphs Abs: 3.5 10*3/uL (ref 1.0–3.6)
MCH: 31.1 pg (ref 26.0–34.0)
MCHC: 33.3 g/dL (ref 32.0–36.0)
MCV: 93.5 fL (ref 80.0–100.0)
Monocytes Absolute: 0.5 10*3/uL (ref 0.2–0.9)
Monocytes Relative: 5 %
Neutro Abs: 4.9 10*3/uL (ref 1.4–6.5)
Neutrophils Relative %: 52 %
Platelets: 331 10*3/uL (ref 150–440)
RBC: 4.08 MIL/uL (ref 3.80–5.20)
RDW: 14.9 % — ABNORMAL HIGH (ref 11.5–14.5)
WBC: 9.3 10*3/uL (ref 3.6–11.0)

## 2015-06-07 LAB — PROTIME-INR
INR: 2.79
Prothrombin Time: 29 seconds — ABNORMAL HIGH (ref 11.4–15.0)

## 2015-06-07 LAB — APTT: aPTT: 45 s — ABNORMAL HIGH (ref 24–36)

## 2015-06-07 MED ORDER — OXYMETAZOLINE HCL 0.05 % NA SOLN
2.0000 | Freq: Once | NASAL | Status: AC
  Administered 2015-06-07: 2 via NASAL
  Filled 2015-06-07: qty 15

## 2015-06-07 MED ORDER — SILVER NITRATE-POT NITRATE 75-25 % EX MISC
1.0000 | Freq: Once | CUTANEOUS | Status: AC
  Administered 2015-06-07: 1 via TOPICAL
  Filled 2015-06-07: qty 1

## 2015-06-07 MED ORDER — OXYMETAZOLINE HCL 0.05 % NA SOLN
NASAL | Status: AC
  Administered 2015-06-07: 2 via NASAL
  Filled 2015-06-07: qty 15

## 2015-06-07 MED ORDER — SILVER NITRATE-POT NITRATE 75-25 % EX MISC
CUTANEOUS | Status: AC
  Administered 2015-06-07: 1 via TOPICAL
  Filled 2015-06-07: qty 1

## 2015-06-07 NOTE — ED Notes (Signed)
Pt states that her rt nare started bleeding at church today, states that her nose felt full of mucous and she blew it and then her nose started to bleed worse, pt reports taking blood thinners

## 2015-06-07 NOTE — ED Notes (Signed)
Pt reports bleeding has stop, applied clamp to nose

## 2015-06-07 NOTE — ED Provider Notes (Addendum)
[facility] Emergency Department Provider Note  ____________________________________________   I have reviewed the triage vital signs and the nursing notes.   HISTORY  Chief Complaint Epistaxis    HPI Brenda Santana is a 55 y.o. female on Coumadin for a history of blood clots prevents today withintermittent nosebleed for the last 5 hours. He'll, and go. It happened at church and then went away and then happened again at her daughter's house and went away. It is from the anterior right now. She does not feel a lot of blood going down her throat. Last INR check was 2.4 she believes. She has had no lightheadedness or easy bruisability or rectal bleeding.  Past Medical History  Diagnosis Date  . Hypertension   . Diabetes mellitus without complication ([place])   . COPD (chronic obstructive pulmonary disease) ([facility])     There are no active problems to display for this patient.   Past Surgical History  Procedure Laterality Date  . Tubal ligation      No current outpatient prescriptions on file.  Allergies Review of patient's allergies indicates not on file.  No family history on file.  Social History Social History  Substance Use Topics  . Smoking status: Former Research scientist (life sciences)  . Smokeless tobacco: None  . Alcohol Use: No    Review of Systems See history of present illness otherwise negative  ____________________________________________   PHYSICAL EXAM:  VITAL SIGNS: ED Triage Vitals  Enc Vitals Group     BP 06/07/15 1703 112/58 mmHg     Pulse Rate 06/07/15 1703 79     Resp 06/07/15 1703 18     Temp 06/07/15 1703 97.5 F (36.4 C)     Temp Source 06/07/15 1703 Oral     SpO2 06/07/15 1703 97 %     Weight 06/07/15 1703 263 lb (119.296 kg)     Height 06/07/15 1703 5\' 5"  (1.651 m)     Head Cir --      Peak Flow --      Pain Score --      Pain Loc --      Pain Edu? --      Excl. in Saline? --     Constitutional: Alert and oriented. Well appearing  and in no acute distress. Eyes: Conjunctivae are normal. PERRL. EOMI. Head: Atraumatic. Nose: There is blood in the right and air anteriorly there is no evidence of blood going down the throat at this time. No active bleeding noted at this moment. Mouth/Throat: Mucous membranes are moist.  Oropharynx non-erythematous. Neck: No stridor.   Nontender with no meningismus Cardiovascular: Normal rate, regular rhythm. Grossly normal heart sounds.  Good peripheral circulation.  Skin:  Skin is warm, dry and intact. No rash noted. Psychiatric: Mood and affect are normal. Speech and behavior are normal.  ____________________________________________   LABS (all labs ordered are listed, but only abnormal results are displayed)  Labs Reviewed  CBC WITH DIFFERENTIAL/PLATELET  PROTIME-INR  APTT   ____________________________________________  EKG  I personally interpreted any EKGs ordered by me or triage  ____________________________________________  RADIOLOGY  I reviewed any imaging ordered by me or triage that were performed during my shift ____________________________________________   PROCEDURES  Procedure(s) performed: After Afrin, patient didn't know bleeding for over an hour and a half in the emergency department. I was able to use the speculum to visualize the septum on the right side. There is some small clot there which I was able to cauterize. I  cannot guarantee I got the exact spot. Patient tolerated the procedure well complications. No bleeding despite sneezing after the procedure there is no evidence of bleeding. No evidence of posterior bleed.   Critical Care performed: None  ____________________________________________   INITIAL IMPRESSION / ASSESSMENT AND PLAN / ED COURSE  Pertinent labs & imaging results that were available during my care of the patient were reviewed by me and considered in my medical decision making (see chart for details).  Patient with a nosebleed  off and on today and Coumadin will check a CBC for platelets and we will check her baseline H&H we'll check INR level. I'm giving her Afrin in her nose and we'll apply pressure pending further investigation. Not actively bleeding at this moment.  ----------------------------------------- 6:26 PM on 06/07/2015 -----------------------------------------  We discussed the need to return for any recurrent bleeding that she cannot control with Afrin and external pressure. No evidence of ongoing bleeding no evidence of posterior bleed. INR is somewhat high at 2.79. We will have the patient hold off her Coumadin tonight. And have it rechecked tomorrow. Return precautions and follow-up given and understood and I'll have her follow up with ENT ____________________________________________   FINAL CLINICAL IMPRESSION(S) / ED DIAGNOSES  Final diagnoses:  None      This chart was dictated using voice recognition software.  Despite best efforts to proofread,  errors can occur which can change meaning.     Schuyler Amor, MD 06/07/15 [place], MD 06/07/15 [phone]

## 2015-06-07 NOTE — Discharge Instructions (Signed)
I recommend holding Coumadin tonight and not taking it. INR is 2.79 which is somewhat high. If you have recurrent bleeding use the Afrin and hold pressure as we discussed. If it continues or seems to be significant including bleeding down her throat or you feel lightheaded return to the emergency room. Follow closely with ENT as an outpatient. Discuss with your doctor tomorrow.

## 2015-07-01 ENCOUNTER — Other Ambulatory Visit: Payer: Self-pay | Admitting: Physician Assistant

## 2015-07-01 ENCOUNTER — Ambulatory Visit
Admission: RE | Admit: 2015-07-01 | Discharge: 2015-07-01 | Disposition: A | Discharge status: Home / Self Care | Payer: Medicare Other | Source: Ambulatory Visit | Attending: Physician Assistant | Admitting: Physician Assistant

## 2015-07-01 DIAGNOSIS — R059 Cough, unspecified: Secondary | ICD-10-CM

## 2015-07-01 DIAGNOSIS — J9811 Atelectasis: Secondary | ICD-10-CM | POA: Insufficient documentation

## 2015-07-01 DIAGNOSIS — R05 Cough: Secondary | ICD-10-CM

## 2015-07-01 DIAGNOSIS — I517 Cardiomegaly: Secondary | ICD-10-CM | POA: Diagnosis not present

## 2015-08-05 ENCOUNTER — Other Ambulatory Visit: Payer: Self-pay | Admitting: Internal Medicine

## 2015-08-05 DIAGNOSIS — R0602 Shortness of breath: Secondary | ICD-10-CM

## 2015-08-18 ENCOUNTER — Ambulatory Visit: Admission: RE | Admit: 2015-08-18 | Payer: Medicare Other | Source: Ambulatory Visit

## 2015-08-27 ENCOUNTER — Ambulatory Visit
Admission: RE | Admit: 2015-08-27 | Discharge: 2015-08-27 | Disposition: A | Discharge status: Home / Self Care | Payer: Medicare Other | Source: Ambulatory Visit | Attending: Internal Medicine | Admitting: Internal Medicine

## 2015-08-27 DIAGNOSIS — R0602 Shortness of breath: Secondary | ICD-10-CM | POA: Insufficient documentation

## 2015-08-27 DIAGNOSIS — R918 Other nonspecific abnormal finding of lung field: Secondary | ICD-10-CM | POA: Insufficient documentation

## 2015-08-27 DIAGNOSIS — J449 Chronic obstructive pulmonary disease, unspecified: Secondary | ICD-10-CM | POA: Diagnosis present

## 2015-08-27 DIAGNOSIS — K760 Fatty (change of) liver, not elsewhere classified: Secondary | ICD-10-CM | POA: Insufficient documentation

## 2015-08-27 LAB — POCT I-STAT CREATININE: Creatinine, Ser: 0.9 mg/dL (ref 0.44–1.00)

## 2015-08-27 MED ORDER — IOPAMIDOL (ISOVUE-300) INJECTION 61%
75.0000 mL | Freq: Once | INTRAVENOUS | Status: AC | PRN
  Administered 2015-08-27: 75 mL via INTRAVENOUS

## 2015-09-20 ENCOUNTER — Encounter: Payer: Self-pay | Admitting: Emergency Medicine

## 2015-09-20 ENCOUNTER — Emergency Department
Admission: EM | Admit: 2015-09-20 | Discharge: 2015-09-20 | Disposition: A | Discharge status: Home / Self Care | Payer: Medicare Other | Attending: Emergency Medicine | Admitting: Emergency Medicine

## 2015-09-20 ENCOUNTER — Emergency Department: Payer: Medicare Other

## 2015-09-20 DIAGNOSIS — Z7984 Long term (current) use of oral hypoglycemic drugs: Secondary | ICD-10-CM | POA: Diagnosis not present

## 2015-09-20 DIAGNOSIS — Z87891 Personal history of nicotine dependence: Secondary | ICD-10-CM | POA: Insufficient documentation

## 2015-09-20 DIAGNOSIS — Z7901 Long term (current) use of anticoagulants: Secondary | ICD-10-CM | POA: Diagnosis not present

## 2015-09-20 DIAGNOSIS — E119 Type 2 diabetes mellitus without complications: Secondary | ICD-10-CM | POA: Diagnosis not present

## 2015-09-20 DIAGNOSIS — M79674 Pain in right toe(s): Secondary | ICD-10-CM | POA: Diagnosis present

## 2015-09-20 DIAGNOSIS — J449 Chronic obstructive pulmonary disease, unspecified: Secondary | ICD-10-CM | POA: Diagnosis not present

## 2015-09-20 DIAGNOSIS — M109 Gout, unspecified: Secondary | ICD-10-CM | POA: Insufficient documentation

## 2015-09-20 DIAGNOSIS — I10 Essential (primary) hypertension: Secondary | ICD-10-CM | POA: Insufficient documentation

## 2015-09-20 HISTORY — DX: Acute embolism and thrombosis of unspecified deep veins of unspecified lower extremity: I82.409

## 2015-09-20 LAB — CBC WITH DIFFERENTIAL/PLATELET
Band Neutrophils: 0 %
Basophils Absolute: 0.1 10*3/uL (ref 0–0.1)
Basophils Relative: 1 %
Blasts: 0 %
Eosinophils Absolute: 0.2 10*3/uL (ref 0–0.7)
Eosinophils Relative: 2 %
HCT: 36.9 % (ref 35.0–47.0)
Hemoglobin: 12.2 g/dL (ref 12.0–16.0)
Lymphocytes Relative: 25 %
Lymphs Abs: 2.6 10*3/uL (ref 1.0–3.6)
MCH: 31.4 pg (ref 26.0–34.0)
MCHC: 33.2 g/dL (ref 32.0–36.0)
MCV: 94.6 fL (ref 80.0–100.0)
Metamyelocytes Relative: 0 %
Monocytes Absolute: 1 10*3/uL — ABNORMAL HIGH (ref 0.2–0.9)
Monocytes Relative: 10 %
Myelocytes: 0 %
Neutro Abs: 6.5 10*3/uL (ref 1.4–6.5)
Neutrophils Relative %: 62 %
Other: 0 %
Platelets: 286 10*3/uL (ref 150–440)
Promyelocytes Absolute: 0 %
RBC: 3.9 MIL/uL (ref 3.80–5.20)
RDW: 14.7 % — ABNORMAL HIGH (ref 11.5–14.5)
WBC: 10.4 10*3/uL (ref 3.6–11.0)
nRBC: 0 /100 WBC

## 2015-09-20 LAB — BASIC METABOLIC PANEL WITH GFR
Anion gap: 8 (ref 5–15)
BUN: 23 mg/dL — ABNORMAL HIGH (ref 6–20)
CO2: 26 mmol/L (ref 22–32)
Calcium: 9.3 mg/dL (ref 8.9–10.3)
Chloride: 101 mmol/L (ref 101–111)
Creatinine, Ser: 0.97 mg/dL (ref 0.44–1.00)
GFR calc Af Amer: >60 mL/min
GFR calc non Af Amer: >60 mL/min
Glucose, Bld: 123 mg/dL — ABNORMAL HIGH (ref 65–99)
Potassium: 3.6 mmol/L (ref 3.5–5.1)
Sodium: 135 mmol/L (ref 135–145)

## 2015-09-20 LAB — PROTIME-INR
INR: 2.69
Prothrombin Time: 28.2 seconds — ABNORMAL HIGH (ref 11.4–15.0)

## 2015-09-20 LAB — URIC ACID: Uric Acid, Serum: 9.5 mg/dL — ABNORMAL HIGH (ref 2.3–6.6)

## 2015-09-20 MED ORDER — KETOROLAC TROMETHAMINE 30 MG/ML IJ SOLN
30.0000 mg | Freq: Once | INTRAMUSCULAR | Status: AC
  Administered 2015-09-20: 30 mg via INTRAMUSCULAR
  Filled 2015-09-20: qty 1

## 2015-09-20 MED ORDER — HYDROCODONE-ACETAMINOPHEN 5-325 MG PO TABS: 1.0000 | ORAL_TABLET | Freq: Four times a day (QID) | ORAL | Status: DC | PRN

## 2015-09-20 MED ORDER — COLCHICINE 0.6 MG PO TABS: 0.6000 mg | ORAL_TABLET | Freq: Every day | ORAL | Status: DC

## 2015-09-20 NOTE — ED Notes (Signed)
pain and swelling to right great toe

## 2015-09-20 NOTE — Discharge Instructions (Signed)
Please see your PCP within 48 hours for follow up and PT/INR recheck  Gout Gout is an inflammatory arthritis caused by a buildup of uric acid crystals in the joints. Uric acid is a chemical that is normally present in the blood. When the level of uric acid in the blood is too high it can form crystals that deposit in your joints and tissues. This causes joint redness, soreness, and swelling (inflammation). Repeat attacks are common. Over time, uric acid crystals can form into masses (tophi) near a joint, destroying bone and causing disfigurement. Gout is treatable and often preventable. CAUSES  The disease begins with elevated levels of uric acid in the blood. Uric acid is produced by your body when it breaks down a naturally found substance called purines. Certain foods you eat, such as meats and fish, contain high amounts of purines. Causes of an elevated uric acid level include:  Being passed down from parent to child (heredity).  Diseases that cause increased uric acid production (such as obesity, psoriasis, and certain cancers).  Excessive alcohol use.  Diet, especially diets rich in meat and seafood.  Medicines, including certain cancer-fighting medicines (chemotherapy), water pills (diuretics), and aspirin.  Chronic kidney disease. The kidneys are no longer able to remove uric acid well.  Problems with metabolism. Conditions strongly associated with gout include:  Obesity.  High blood pressure.  High cholesterol.  Diabetes. Not everyone with elevated uric acid levels gets gout. It is not understood why some people get gout and others do not. Surgery, joint injury, and eating too much of certain foods are some of the factors that can lead to gout attacks. SYMPTOMS   An attack of gout comes on quickly. It causes intense pain with redness, swelling, and warmth in a joint.  Fever can occur.  Often, only one joint is involved. Certain joints are more commonly involved:  Base  of the big toe.  Knee.  Ankle.  Wrist.  Finger. Without treatment, an attack usually goes away in a few days to weeks. Between attacks, you usually will not have symptoms, which is different from many other forms of arthritis. DIAGNOSIS  Your caregiver will suspect gout based on your symptoms and exam. In some cases, tests may be recommended. The tests may include:  Blood tests.  Urine tests.  X-rays.  Joint fluid exam. This exam requires a needle to remove fluid from the joint (arthrocentesis). Using a microscope, gout is confirmed when uric acid crystals are seen in the joint fluid. TREATMENT  There are two phases to gout treatment: treating the sudden onset (acute) attack and preventing attacks (prophylaxis).  Treatment of an Acute Attack.  Medicines are used. These include anti-inflammatory medicines or steroid medicines.  An injection of steroid medicine into the affected joint is sometimes necessary.  The painful joint is rested. Movement can worsen the arthritis.  You may use warm or cold treatments on painful joints, depending which works best for you.  Treatment to Prevent Attacks.  If you suffer from frequent gout attacks, your caregiver may advise preventive medicine. These medicines are started after the acute attack subsides. These medicines either help your kidneys eliminate uric acid from your body or decrease your uric acid production. You may need to stay on these medicines for a very long time.  The early phase of treatment with preventive medicine can be associated with an increase in acute gout attacks. For this reason, during the first few months of treatment, your caregiver may also  advise you to take medicines usually used for acute gout treatment. Be sure you understand your caregiver's directions. Your caregiver may make several adjustments to your medicine dose before these medicines are effective.  Discuss dietary treatment with your caregiver or  dietitian. Alcohol and drinks high in sugar and fructose and foods such as meat, poultry, and seafood can increase uric acid levels. Your caregiver or dietitian can advise you on drinks and foods that should be limited. HOME CARE INSTRUCTIONS   Do not take aspirin to relieve pain. This raises uric acid levels.  Only take over-the-counter or prescription medicines for pain, discomfort, or fever as directed by your caregiver.  Rest the joint as much as possible. When in bed, keep sheets and blankets off painful areas.  Keep the affected joint raised (elevated).  Apply warm or cold treatments to painful joints. Use of warm or cold treatments depends on which works best for you.  Use crutches if the painful joint is in your leg.  Drink enough fluids to keep your urine clear or pale yellow. This helps your body get rid of uric acid. Limit alcohol, sugary drinks, and fructose drinks.  Follow your dietary instructions. Pay careful attention to the amount of protein you eat. Your daily diet should emphasize fruits, vegetables, whole grains, and fat-free or low-fat milk products. Discuss the use of coffee, vitamin C, and cherries with your caregiver or dietitian. These may be helpful in lowering uric acid levels.  Maintain a healthy body weight. SEEK MEDICAL CARE IF:   You develop diarrhea, vomiting, or any side effects from medicines.  You do not feel better in 24 hours, or you are getting worse. SEEK IMMEDIATE MEDICAL CARE IF:   Your joint becomes suddenly more tender, and you have chills or a fever. MAKE SURE YOU:   Understand these instructions.  Will watch your condition.  Will get help right away if you are not doing well or get worse.   This information is not intended to replace advice given to you by your health care provider. Make sure you discuss any questions you have with your health care provider.   Document Released: 04/15/2000 Document Revised: 05/09/2014 Document  Reviewed: 11/30/2011 Elsevier Interactive Patient Education 2016 [place] are compounds that affect the level of uric acid in your body. A low-purine diet is a diet that is low in purines. Eating a low-purine diet can prevent the level of uric acid in your body from getting too high and causing gout or kidney stones or both. WHAT DO I NEED TO KNOW ABOUT THIS DIET?  Choose low-purine foods. Examples of low-purine foods are listed in the next section.  Drink plenty of fluids, especially water. Fluids can help remove uric acid from your body. Try to drink 8-16 cups (1.9-3.8 L) a day.  Limit foods high in fat, especially saturated fat, as fat makes it harder for the body to get rid of uric acid. Foods high in saturated fat include pizza, cheese, ice cream, whole milk, fried foods, and gravies. Choose foods that are lower in fat and lean sources of protein. Use olive oil when cooking as it contains healthy fats that are not high in saturated fat.  Limit alcohol. Alcohol interferes with the elimination of uric acid from your body. If you are having a gout attack, avoid all alcohol.  Keep in mind that different people's bodies react differently to different foods. You will probably learn over time which foods  do or do not affect you. If you discover that a food tends to cause your gout to flare up, avoid eating that food. You can more freely enjoy foods that do not cause problems. If you have any questions about a food item, talk to your dietitian or health care provider. WHICH FOODS ARE LOW, MODERATE, AND HIGH IN PURINES? The following is a list of foods that are low, moderate, and high in purines. You can eat any amount of the foods that are low in purines. You may be able to have small amounts of foods that are moderate in purines. Ask your health care provider how much of a food moderate in purines you can have. Avoid foods high in purines. Grains  Foods low in  purines: Enriched white bread, pasta, rice, cake, cornbread, popcorn.  Foods moderate in purines: Whole-grain breads and cereals, wheat germ, bran, oatmeal. Uncooked oatmeal. Dry wheat bran or wheat germ.  Foods high in purines: Pancakes, [place] toast, biscuits, muffins. Vegetables  Foods low in purines: All vegetables, except those that are moderate in purines.  Foods moderate in purines: Asparagus, cauliflower, spinach, mushrooms, green peas. Fruits  All fruits are low in purines. Meats and other Protein Foods  Foods low in purines: Eggs, nuts, peanut butter.  Foods moderate in purines: 80-90% lean beef, lamb, veal, pork, poultry, fish, eggs, peanut butter, nuts. Crab, lobster, oysters, and shrimp. Cooked dried beans, peas, and lentils.  Foods high in purines: Anchovies, sardines, herring, mussels, tuna, codfish, scallops, trout, and haddock. Berniece Salines. Organ meats (such as liver or kidney). [place]. Game meat. Goose. Sweetbreads. Dairy  All dairy foods are low in purines. Low-fat and fat-free dairy products are best because they are low in saturated fat. Beverages  Drinks low in purines: Water, carbonated beverages, tea, coffee, cocoa.  Drinks moderate in purines: Soft drinks and other drinks sweetened with high-fructose corn syrup. Juices. To find whether a food or drink is sweetened with high-fructose corn syrup, look at the ingredients list.  Drinks high in purines: Alcoholic beverages (such as beer). Condiments  Foods low in purines: Salt, herbs, olives, pickles, relishes, vinegar.  Foods moderate in purines: Butter, margarine, oils, mayonnaise. Fats and Oils  Foods low in purines: All types, except gravies and sauces made with meat.  Foods high in purines: Gravies and sauces made with meat. Other Foods  Foods low in purines: Sugars, sweets, gelatin. Cake. Soups made without meat.  Foods moderate in purines: Meat-based or fish-based soups, broths, or bouillons. Foods and  drinks sweetened with high-fructose corn syrup.  Foods high in purines: High-fat desserts (such as ice cream, cookies, cakes, pies, doughnuts, and chocolate). Contact your dietitian for more information on foods that are not listed here.   This information is not intended to replace advice given to you by your health care provider. Make sure you discuss any questions you have with your health care provider.   Document Released: 08/13/2010 Document Revised: 04/23/2013 Document Reviewed: 03/25/2013 Elsevier Interactive Patient Education [employer].

## 2015-09-20 NOTE — ED Notes (Signed)
States she developed pain to right great toes about 3 days ago unsure if something stung her   Right foot is red and slightly swollen

## 2015-09-20 NOTE — ED Provider Notes (Signed)
[facility] Emergency Department Provider Note  ____________________________________________  Time seen: Approximately 8:01 AM  I have reviewed the triage vital signs and the nursing notes.   HISTORY  Chief Complaint Toe Pain    HPI Brenda Santana is a 55 y.o. female , NAD, presents to the emergency department with three-day history of right great toe pain. States that the toe began to hurt in the joints approximately 3 days ago. States the pain can radiate from the tip of the day to the distal part of her right foot. Denies any injury, traumas, falls. Does not believe she was bitten or stung by anything. Has not noted any open wounds, skin sores, lacerations, oozing, weeping. Has not had fever, chills, body aches. States she had one episode of emesis 2 days ago after taking tramadol and gabapentin in which she has prescriptions for home. Has had no further episodes of emesis and denies any nausea or abdominal pain. States she saw her primary care provider last Monday for her regular follow-up. States all of her lab work was without significant abnormalities including A1c and PT/INR. Is on Coumadin and has been on the same dosage for some time. States she has her PT/INR checked once monthly when she visits her primary care provider. States she takes her medicines on a daily basis and has not missed any dosages recently. Has not checked her blood sugar since being in her primary care provider's office last Monday. Denies any chest pain, shortness of breath, changes in vision, numbness, weakness, tingling. Has not noted any swelling about her right lower extremity other than about the MTP of the great toe. Denies any history of gout but does state that her grandfather and brother both have history of gout.   Past Medical History  Diagnosis Date  . Hypertension   . Diabetes mellitus without complication ([place])   . COPD (chronic obstructive pulmonary disease) ([place])   . DVT  (deep venous thrombosis) ([place])     There are no active problems to display for this patient.   Past Surgical History  Procedure Laterality Date  . Tubal ligation      Current Outpatient Rx  Name  Route  Sig  Dispense  Refill  . metFORMIN (GLUCOPHAGE) 500 MG tablet   Oral   Take 500 mg by mouth 2 (two) times daily with a meal.         . warfarin (COUMADIN) 7.5 MG tablet   Oral   Take 7.5 mg by mouth daily.         . colchicine 0.6 MG tablet   Oral   Take 1 tablet (0.6 mg total) by mouth daily. Take 2 tablets at onset of pain, then may take 1 more 1 hour later if pain continues.   6 tablet   0   . HYDROcodone-acetaminophen (NORCO) 5-325 MG tablet   Oral   Take 1 tablet by mouth every 6 (six) hours as needed for severe pain.   6 tablet   0     Allergies Review of patient's allergies indicates no known allergies.  No family history on file.  Social History Social History  Substance Use Topics  . Smoking status: Former Research scientist (life sciences)  . Smokeless tobacco: None  . Alcohol Use: No     Review of Systems  Constitutional: No fever/chills, fatigue Eyes: No visual changes.  Cardiovascular: No chest pain, palpitations. Respiratory: No cough. No shortness of breath. No wheezing.  Gastrointestinal: Positive one episode emesis.  No abdominal pain.  No nausea.  No diarrhea.   Musculoskeletal: Positive right great toe pain. Negative for right foot, right ankle, right lower extremity, back pain.  Skin: Positive redness and swelling about right great toe. Negative for rash, open wounds, skin sores, oozing, weeping. Neurological: Negative for headaches, focal weakness or numbness. No tingling. 10-point ROS otherwise negative.  ____________________________________________   PHYSICAL EXAM:  VITAL SIGNS: ED Triage Vitals  Enc Vitals Group     BP 09/20/15 0752 134/74 mmHg     Pulse Rate 09/20/15 0752 69     Resp 09/20/15 0752 20     Temp 09/20/15 0752 98 F (36.7 C)      Temp Source 09/20/15 0752 Oral     SpO2 09/20/15 0752 97 %     Weight 09/20/15 0752 261 lb (118.389 kg)     Height 09/20/15 0752 5\' 5"  (1.651 m)     Head Cir --      Peak Flow --      Pain Score 09/20/15 0749 10     Pain Loc --      Pain Edu? --      Excl. in [place]? --      Constitutional: Alert and oriented. Well appearing and in no acute distress. Eyes: Conjunctivae are normal.  Head: Atraumatic. Neck: Supple with full range of motion. Hematological/Lymphatic/Immunilogical: No cervical lymphadenopathy. Cardiovascular: Normal rate, regular rhythm. Grossly normal heart sounds.  Good peripheral circulationWith 2+ pulses noted in the right lower extremity. Capillary refill in the digits of the right lower extremity is brisk. Respiratory: Normal respiratory effort without tachypnea or retractions. Lungs CTAB with breath sounds noted in all lung fields. Musculoskeletal: Redness, warmth, swelling about the right great toe MTP. Tenderness to palpation over this joint. No lower extremity tenderness nor edema.  No joint effusions. Neurologic:  Normal speech and language. No gross focal neurologic deficits are appreciated. Sensation to light touch grossly intact about the right lower extremity. Skin:  Skin is warm, dry and intact. No rash, skin sores, open wounds noted. Psychiatric: Mood and affect are normal. Speech and behavior are normal. Patient exhibits appropriate insight and judgement.   ____________________________________________   LABS (all labs ordered are listed, but only abnormal results are displayed)  Labs Reviewed  BASIC METABOLIC PANEL - Abnormal; Notable for the following:    Glucose, Bld 123 (*)    BUN 23 (*)    All other components within normal limits  CBC WITH DIFFERENTIAL/PLATELET - Abnormal; Notable for the following:    RDW 14.7 (*)    Monocytes Absolute 1.0 (*)    All other components within normal limits  PROTIME-INR - Abnormal; Notable for the following:     Prothrombin Time 28.2 (*)    All other components within normal limits  URIC ACID - Abnormal; Notable for the following:    Uric Acid, Serum 9.5 (*)    All other components within normal limits   ____________________________________________  EKG  None ____________________________________________  RADIOLOGY I have personally viewed and evaluated these images (plain radiographs) as part of my medical decision making, as well as reviewing the written report by the radiologist.  Dg Toe Great Right  09/20/2015  CLINICAL DATA:  Right toe pain, swelling and erythema for the past 2 days. History of diabetes. Evaluate for gout. EXAM: RIGHT GREAT TOE COMPARISON:  None. FINDINGS: No fracture or dislocation. Joint spaces are preserved. No erosions. No hallux valgus deformity. No discrete areas of osteolysis to suggest  osteomyelitis. Suspected mild diffuse soft tissue swelling about forefoot. No radiopaque foreign body. Moderate-sized plantar calcaneal spur. IMPRESSION: 1. Nonspecific mild diffuse soft tissue swelling about the forefoot without associated fracture or radiopaque foreign body. No radiographic evidence of gouty arthritis. 2. Moderate-sized plantar calcaneal spur. Electronically Signed   By: Sandi Mariscal M.D.   On: 09/20/2015 08:33    ____________________________________________    PROCEDURES  Procedure(s) performed: None    Medications  ketorolac (TORADOL) 30 MG/ML injection 30 mg (30 mg Intramuscular Given 09/20/15 0824)     ____________________________________________   INITIAL IMPRESSION / ASSESSMENT AND PLAN / ED COURSE  Pertinent labs & imaging results that were available during my care of the patient were reviewed by me and considered in my medical decision making (see chart for details).    Patient's diagnosis is consistent with Acute gout involving the right great toe. No evidence of bacterial infection based on history, physical exam and laboratory results.  Patient  will be discharged home with prescriptions for colchicine and Norco to take as directed. Due to the patient's chronic Coumadin, I opted not to prescribe anti-inflammatory due to the potential increase in hypocoagulability. Patient is to follow up with her primary care provider, Dr. Radford Pax, within 48 hours for recheck. Patient is given ED precautions to return to the ED for any worsening or new symptoms.     ____________________________________________  FINAL CLINICAL IMPRESSION(S) / ED DIAGNOSES  Final diagnoses:  Acute gout involving toe of right foot, unspecified cause      NEW MEDICATIONS STARTED DURING THIS VISIT:  New Prescriptions   COLCHICINE 0.6 MG TABLET    Take 1 tablet (0.6 mg total) by mouth daily. Take 2 tablets at onset of pain, then may take 1 more 1 hour later if pain continues.   HYDROCODONE-ACETAMINOPHEN (NORCO) 5-325 MG TABLET    Take 1 tablet by mouth every 6 (six) hours as needed for severe pain.         Braxton Feathers, PA-C 09/20/15 [id number]  Delman Kitten, MD 09/20/15 [phone]

## 2015-10-08 ENCOUNTER — Other Ambulatory Visit: Payer: Self-pay | Admitting: Internal Medicine

## 2015-10-08 DIAGNOSIS — R0602 Shortness of breath: Secondary | ICD-10-CM

## 2015-10-15 ENCOUNTER — Ambulatory Visit
Admission: RE | Admit: 2015-10-15 | Discharge: 2015-10-15 | Disposition: A | Discharge status: Home / Self Care | Payer: Medicare Other | Source: Ambulatory Visit | Attending: Internal Medicine | Admitting: Internal Medicine

## 2015-10-15 DIAGNOSIS — R0602 Shortness of breath: Secondary | ICD-10-CM | POA: Diagnosis present

## 2015-10-15 MED ORDER — IOPAMIDOL (ISOVUE-370) INJECTION 76%
75.0000 mL | Freq: Once | INTRAVENOUS | Status: AC | PRN
  Administered 2015-10-15: 75 mL via INTRAVENOUS

## 2016-06-21 DIAGNOSIS — M7551 Bursitis of right shoulder: Secondary | ICD-10-CM | POA: Insufficient documentation

## 2016-10-19 DIAGNOSIS — M7552 Bursitis of left shoulder: Secondary | ICD-10-CM | POA: Insufficient documentation

## 2017-01-05 DIAGNOSIS — Z0289 Encounter for other administrative examinations: Secondary | ICD-10-CM | POA: Insufficient documentation

## 2017-04-14 ENCOUNTER — Other Ambulatory Visit: Payer: Self-pay | Admitting: Internal Medicine

## 2017-04-17 ENCOUNTER — Other Ambulatory Visit: Payer: Self-pay

## 2017-04-17 MED ORDER — CANAGLIFLOZIN-METFORMIN HCL 50-500 MG PO TABS: 50.0000 mg | ORAL_TABLET | Freq: Two times a day (BID) | ORAL | 3 refills | Status: DC

## 2017-04-24 ENCOUNTER — Other Ambulatory Visit: Payer: Self-pay | Admitting: Internal Medicine

## 2017-04-26 ENCOUNTER — Other Ambulatory Visit: Payer: Self-pay | Admitting: Internal Medicine

## 2017-05-22 ENCOUNTER — Other Ambulatory Visit: Payer: Self-pay | Admitting: Internal Medicine

## 2017-06-04 ENCOUNTER — Encounter: Payer: Self-pay | Admitting: Emergency Medicine

## 2017-06-04 ENCOUNTER — Other Ambulatory Visit: Payer: Self-pay

## 2017-06-04 ENCOUNTER — Emergency Department
Admission: EM | Admit: 2017-06-04 | Discharge: 2017-06-04 | Disposition: A | Discharge status: Home / Self Care | Payer: Medicare Other | Attending: Emergency Medicine | Admitting: Emergency Medicine

## 2017-06-04 ENCOUNTER — Emergency Department: Payer: Medicare Other

## 2017-06-04 DIAGNOSIS — Z7901 Long term (current) use of anticoagulants: Secondary | ICD-10-CM | POA: Diagnosis not present

## 2017-06-04 DIAGNOSIS — I10 Essential (primary) hypertension: Secondary | ICD-10-CM | POA: Insufficient documentation

## 2017-06-04 DIAGNOSIS — R609 Edema, unspecified: Secondary | ICD-10-CM

## 2017-06-04 DIAGNOSIS — N61 Mastitis without abscess: Secondary | ICD-10-CM | POA: Diagnosis not present

## 2017-06-04 DIAGNOSIS — Z87891 Personal history of nicotine dependence: Secondary | ICD-10-CM | POA: Insufficient documentation

## 2017-06-04 DIAGNOSIS — N644 Mastodynia: Secondary | ICD-10-CM | POA: Diagnosis present

## 2017-06-04 DIAGNOSIS — Z7984 Long term (current) use of oral hypoglycemic drugs: Secondary | ICD-10-CM | POA: Insufficient documentation

## 2017-06-04 DIAGNOSIS — J449 Chronic obstructive pulmonary disease, unspecified: Secondary | ICD-10-CM | POA: Diagnosis not present

## 2017-06-04 DIAGNOSIS — E119 Type 2 diabetes mellitus without complications: Secondary | ICD-10-CM | POA: Insufficient documentation

## 2017-06-04 DIAGNOSIS — Z79899 Other long term (current) drug therapy: Secondary | ICD-10-CM | POA: Diagnosis not present

## 2017-06-04 LAB — COMPREHENSIVE METABOLIC PANEL
ALT: 22 U/L (ref 14–54)
AST: 27 U/L (ref 15–41)
Albumin: 4.1 g/dL (ref 3.5–5.0)
Alkaline Phosphatase: 92 U/L (ref 38–126)
Anion gap: 10 (ref 5–15)
BUN: 22 mg/dL — ABNORMAL HIGH (ref 6–20)
CO2: 24 mmol/L (ref 22–32)
Calcium: 9.3 mg/dL (ref 8.9–10.3)
Chloride: 102 mmol/L (ref 101–111)
Creatinine, Ser: 0.96 mg/dL (ref 0.44–1.00)
GFR calc Af Amer: >60 mL/min (ref >60)
GFR calc non Af Amer: >60 mL/min (ref >60)
Glucose, Bld: 152 mg/dL — ABNORMAL HIGH (ref 65–99)
Potassium: 3.6 mmol/L (ref 3.5–5.1)
Sodium: 136 mmol/L (ref 135–145)
Total Bilirubin: 0.6 mg/dL (ref 0.3–1.2)
Total Protein: 7.4 g/dL (ref 6.5–8.1)

## 2017-06-04 LAB — CBC WITH DIFFERENTIAL/PLATELET
Basophils Absolute: 0.1 10*3/uL (ref 0–0.1)
Basophils Relative: 1 %
Eosinophils Absolute: 0.2 10*3/uL (ref 0–0.7)
Eosinophils Relative: 2 %
HCT: 42.5 % (ref 35.0–47.0)
Hemoglobin: 13.5 g/dL (ref 12.0–16.0)
Lymphocytes Relative: 21 %
Lymphs Abs: 2 10*3/uL (ref 1.0–3.6)
MCH: 31 pg (ref 26.0–34.0)
MCHC: 31.8 g/dL — ABNORMAL LOW (ref 32.0–36.0)
MCV: 97.5 fL (ref 80.0–100.0)
Monocytes Absolute: 0.8 10*3/uL (ref 0.2–0.9)
Monocytes Relative: 8 %
Neutro Abs: 6.6 10*3/uL — ABNORMAL HIGH (ref 1.4–6.5)
Neutrophils Relative %: 68 %
Platelets: 316 10*3/uL (ref 150–440)
RBC: 4.36 MIL/uL (ref 3.80–5.20)
RDW: 16.2 % — ABNORMAL HIGH (ref 11.5–14.5)
WBC: 9.7 10*3/uL (ref 3.6–11.0)

## 2017-06-04 MED ORDER — AMOXICILLIN-POT CLAVULANATE 875-125 MG PO TABS: 1.0000 | ORAL_TABLET | Freq: Two times a day (BID) | ORAL | 0 refills | Status: AC

## 2017-06-04 MED ORDER — HYDROCODONE-ACETAMINOPHEN 5-325 MG PO TABS: 1.0000 | ORAL_TABLET | ORAL | 0 refills | Status: DC | PRN

## 2017-06-04 MED ORDER — KETOROLAC TROMETHAMINE 60 MG/2ML IM SOLN
60.0000 mg | Freq: Once | INTRAMUSCULAR | Status: AC
  Administered 2017-06-04: 60 mg via INTRAMUSCULAR
  Filled 2017-06-04: qty 2

## 2017-06-04 NOTE — ED Notes (Signed)

## 2017-06-04 NOTE — ED Notes (Signed)
Pt came in today with c/o L nipple/breast pain.  Pt states that her nipple "aches like a toothache".  When questioned further, pt states that the pain is constant, throbbing, shooting pain in her nipple inwards to her breast.  Pt states that her mother was recently diagnosed with breast cancer and had a lumpectomy at [facility] in the past month.  Pt states that she thought that her period was causing the pain, but that the swelling and pain has increased.  Pt is A&Ox4, in NAD.

## 2017-06-04 NOTE — ED Provider Notes (Signed)
[facility] Emergency Department Provider Note  Time seen: 12:33 PM  I have reviewed the triage vital signs and the nursing notes.   HISTORY  Chief Complaint Breast Pain    HPI Brenda Santana is a 57 y.o. female with a past medical history of COPD, diabetes, hypertension, presents to the emergency department with left breast pain and swelling.  According to the patient over the past 3-4 days she has noted progressive increase in pain and swelling to the left nipple and areolea.  Denies any discharge.  Denies any lumps bumps or masses in the breast.  States she had a normal mammogram approximately 2 months ago.  Denies any other chest pain, trouble breathing, nausea or fever.  Largely negative review of systems.   Past Medical History:  Diagnosis Date  . COPD (chronic obstructive pulmonary disease) ([place])   . Diabetes mellitus without complication ([place])   . DVT (deep venous thrombosis) ([place])   . Hypertension     There are no active problems to display for this patient.   Past Surgical History:  Procedure Laterality Date  . TUBAL LIGATION      Prior to Admission medications   Medication Sig Start Date End Date Taking? Authorizing Provider  acyclovir ointment (ZOVIRAX) 5 % APPLY A SMALL AMOUNT TO AFFECTED AREA 3 TIMES A DAY FOR 7 DAYS 04/24/17   Lavera Guise, MD  allopurinol (ZYLOPRIM) 300 MG tablet TAKE ONE TAB AT NIGHT FOR GOUT 05/22/17   Ronnell Freshwater, NP  Canagliflozin-Metformin HCl (INVOKAMET) 50-500 MG TABS Take 50-500 mg by mouth 2 (two) times daily. Take 1 tab po twice a day 04/17/17   Lavera Guise, MD  colchicine 0.6 MG tablet Take 1 tablet (0.6 mg total) by mouth daily. Take 2 tablets at onset of pain, then may take 1 more 1 hour later if pain continues. 09/20/15   Hagler, Jami L, PA-C  HYDROcodone-acetaminophen (NORCO) 5-325 MG tablet Take 1 tablet by mouth every 6 (six) hours as needed for severe pain. 09/20/15   Hagler, Jami L, PA-C   INVOKAMET XR 50-500 MG TB24 TAKE 1 TABLET BY MOUTH TWICE A DAY 04/19/17   Lavera Guise, MD  lidocaine (XYLOCAINE) 5 % ointment APPLY A SMALL AMOUNT TO AFFECTED AREA 2-3 TIMES A DAY 04/24/17   Lavera Guise, MD  metFORMIN (GLUCOPHAGE) 500 MG tablet Take 500 mg by mouth 2 (two) times daily with a meal.    [provider]  montelukast (SINGULAIR) 10 MG tablet TAKE 1 TABLET BY MOUTH DAILY FOR ASTHMA 04/26/17   Lavera Guise, MD  valACYclovir (VALTREX) 500 MG tablet  10/08/14   [provider]  warfarin (COUMADIN) 7.5 MG tablet Take 7.5 mg by mouth daily.    [provider]    No Known Allergies  History reviewed. No pertinent family history.  Social History Social History   Tobacco Use  . Smoking status: Former Research scientist (life sciences)  . Smokeless tobacco: Never Used  Substance Use Topics  . Alcohol use: No  . Drug use: No    Review of Systems Constitutional: Negative for fever. Eyes: Negative for visual complaints ENT: Negative for recent illness/congestion Cardiovascular: Negative for chest pain. Respiratory: Negative for shortness of breath. Gastrointestinal: Negative for abdominal pain, vomiting  Genitourinary: Negative for urinary compaints Musculoskeletal: Negative for musculoskeletal complaints Skin: Swelling with mild erythema of the left nipple and areola. Neurological: Negative for headache All other ROS negative  ____________________________________________   PHYSICAL  EXAM:  VITAL SIGNS: ED Triage Vitals  Enc Vitals Group     BP 06/04/17 1133 125/69     Pulse Rate 06/04/17 1133 80     Resp 06/04/17 1133 18     Temp 06/04/17 1133 (!) 97.5 F (36.4 C)     Temp Source 06/04/17 1133 Oral     SpO2 06/04/17 1133 93 %     Weight 06/04/17 1136 246 lb (111.6 kg)     Height 06/04/17 1136 5\' 5"  (1.651 m)     Head Circumference --      Peak Flow --      Pain Score --      Pain Loc --      Pain Edu? --      Excl. in [place]? --    Constitutional: Alert and  oriented. Well appearing and in no distress. Eyes: Normal exam ENT   Head: Normocephalic and atraumatic.   Mouth/Throat: Mucous membranes are moist. Cardiovascular: Normal rate, regular rhythm. No murmur Respiratory: Normal respiratory effort without tachypnea nor retractions. Breath sounds are clear and equal bilaterally. No wheezes/rales/rhonchi. Gastrointestinal: Soft and nontender. No distention.  Obese. Musculoskeletal: Nontender with normal range of motion in all extremities. Neurologic:  Normal speech and language. No gross focal neurologic deficits Skin: Patient has moderate tenderness of the left nipple with significant swelling of the left nipple compared to the right.  Mild erythema of the nipple and areola with mild to moderate tenderness of the areola.  The remainder of the breast is nontender with no masses palpated. Psychiatric: Mood and affect are normal.   ____________________________________________    RADIOLOGY  No discernible fluid collection on breast ultrasound.  ____________________________________________   INITIAL IMPRESSION / ASSESSMENT AND PLAN / ED COURSE  Pertinent labs & imaging results that were available during my care of the patient were reviewed by me and considered in my medical decision making (see chart for details).  Patient presents to the emergency department for left nipple swelling and tenderness.  Differential would include abscess, mastitis, tumor/mass.  I discussed the patient with Dr. Mariea Clonts of radiology, he agrees with decision to ultrasound breast to rule out abscess.  Patient's labs are largely within normal limits including a normal white blood cell count of 9.7.  Patient's labs are largely within normal limits.  Ultrasound shows no discernible fluid collection.  Suspect likely cellulitis.  We will discharge with antibiotics.  Patient is to follow-up with her primary care doctor in 2 days for recheck.  If symptoms worsen or she  develops a fever discussed return precautions to the emergency department otherwise PCP follow-up.  Patient agreeable to this plan of care.  We also discharged with a short course of pain medication to be used as needed. We will discharge with Augmentin twice daily times 10 days.  ____________________________________________   FINAL CLINICAL IMPRESSION(S) / ED DIAGNOSES  Breast cellulitis    Harvest Dark, MD 06/04/17 1418

## 2017-06-04 NOTE — Discharge Instructions (Signed)
Please take your antibiotics as prescribed, pain medication as needed, but only as written.  Do not drink alcohol or drive while taking pain medication.  Follow-up with your doctor in the next 2-3 days for recheck/reevaluation.  Return to the emergency department for development of fever worsening swelling or pain of the breast.

## 2017-06-04 NOTE — ED Triage Notes (Addendum)
Pt states woke up approx 3 days ago with pain to her L nipple. Pt states yesterday began having some swelling, today L nipple is noted to be tender and swollen per patient. Moderate swelling noted at this time with some redness.

## 2017-06-04 NOTE — ED Notes (Signed)
Reported to ED provider patient pain level 10/10.

## 2017-06-09 ENCOUNTER — Encounter: Payer: Self-pay | Admitting: Internal Medicine

## 2017-06-09 DIAGNOSIS — I808 Phlebitis and thrombophlebitis of other sites: Secondary | ICD-10-CM | POA: Insufficient documentation

## 2017-06-09 DIAGNOSIS — Z7901 Long term (current) use of anticoagulants: Secondary | ICD-10-CM | POA: Insufficient documentation

## 2017-06-09 DIAGNOSIS — G4733 Obstructive sleep apnea (adult) (pediatric): Secondary | ICD-10-CM | POA: Insufficient documentation

## 2017-06-09 DIAGNOSIS — A6004 Herpesviral vulvovaginitis: Secondary | ICD-10-CM | POA: Insufficient documentation

## 2017-06-09 DIAGNOSIS — M109 Gout, unspecified: Secondary | ICD-10-CM | POA: Insufficient documentation

## 2017-06-09 DIAGNOSIS — I10 Essential (primary) hypertension: Secondary | ICD-10-CM | POA: Insufficient documentation

## 2017-06-09 DIAGNOSIS — E1140 Type 2 diabetes mellitus with diabetic neuropathy, unspecified: Secondary | ICD-10-CM | POA: Insufficient documentation

## 2017-06-12 ENCOUNTER — Other Ambulatory Visit: Payer: Self-pay

## 2017-06-12 MED ORDER — ALBUTEROL SULFATE HFA 108 (90 BASE) MCG/ACT IN AERS: 2.0000 | INHALATION_SPRAY | RESPIRATORY_TRACT | 3 refills | Status: DC | PRN

## 2017-06-13 ENCOUNTER — Ambulatory Visit: Payer: Self-pay | Admitting: Internal Medicine

## 2017-06-14 ENCOUNTER — Other Ambulatory Visit: Payer: Self-pay | Admitting: Internal Medicine

## 2017-06-21 ENCOUNTER — Other Ambulatory Visit: Payer: Self-pay

## 2017-06-21 MED ORDER — PREDNISONE 5 MG PO TABS: 20.0000 mg | ORAL_TABLET | Freq: Every day | ORAL | 0 refills | Status: DC

## 2017-07-13 ENCOUNTER — Other Ambulatory Visit: Payer: Self-pay | Admitting: Internal Medicine

## 2017-07-28 ENCOUNTER — Other Ambulatory Visit: Payer: Self-pay | Admitting: Internal Medicine

## 2017-08-03 ENCOUNTER — Ambulatory Visit: Payer: Self-pay | Admitting: Internal Medicine

## 2017-08-08 ENCOUNTER — Ambulatory Visit (INDEPENDENT_AMBULATORY_CARE_PROVIDER_SITE_OTHER): Payer: Medicare Other | Admitting: Internal Medicine

## 2017-08-08 ENCOUNTER — Encounter: Payer: Self-pay | Admitting: Internal Medicine

## 2017-08-08 VITALS — BP 110/70 | HR 76 | Resp 16 | Ht 65.0 in | Wt 266.4 lb

## 2017-08-08 DIAGNOSIS — J440 Chronic obstructive pulmonary disease with acute lower respiratory infection: Secondary | ICD-10-CM

## 2017-08-08 DIAGNOSIS — Z86718 Personal history of other venous thrombosis and embolism: Secondary | ICD-10-CM

## 2017-08-08 DIAGNOSIS — E119 Type 2 diabetes mellitus without complications: Secondary | ICD-10-CM

## 2017-08-08 DIAGNOSIS — J209 Acute bronchitis, unspecified: Secondary | ICD-10-CM | POA: Diagnosis not present

## 2017-08-08 LAB — POCT GLYCOSYLATED HEMOGLOBIN (HGB A1C): Hemoglobin A1C: 8.2

## 2017-08-08 LAB — POCT INR: INR: 2.8

## 2017-08-08 MED ORDER — AZITHROMYCIN 250 MG PO TABS: ORAL_TABLET | ORAL | 0 refills | Status: DC

## 2017-08-08 MED ORDER — PHENTERMINE HCL 37.5 MG PO CAPS: 37.5000 mg | ORAL_CAPSULE | ORAL | 0 refills | Status: DC

## 2017-08-08 MED ORDER — IPRATROPIUM-ALBUTEROL 0.5-2.5 (3) MG/3ML IN SOLN
3.0000 mL | Freq: Once | RESPIRATORY_TRACT | Status: AC
  Administered 2017-08-09: 3 mL via RESPIRATORY_TRACT

## 2017-08-08 NOTE — Progress Notes (Signed)
[facility] [place], [place] [address]  Internal MEDICINE  Office Visit Note  Patient Name: Brenda Santana  [id number]  [id number]  Date of Service: 08/25/2017  Chief Complaint  Patient presents with  . Diabetes  . Coagulation Disorder    HPI  Pt is here for routine follow up.    Current Medication: Outpatient Encounter Medications as of 08/08/2017  Medication Sig  . acyclovir ointment (ZOVIRAX) 5 % APPLY A SMALL AMOUNT TO AFFECTED AREA 3 TIMES A DAY FOR 7 DAYS  . albuterol (PROVENTIL HFA;VENTOLIN HFA) 108 (90 Base) MCG/ACT inhaler INHALE 2 PUFFS 4 TIMES A DAY AS NEEDED  . allopurinol (ZYLOPRIM) 300 MG tablet TAKE ONE TAB AT NIGHT FOR GOUT  . budesonide-formoterol (SYMBICORT) 160-4.5 MCG/ACT inhaler Inhale 2 puffs into the lungs 2 (two) times daily.  . chlorthalidone (HYGROTON) 25 MG tablet Take 25 mg by mouth daily.  . colchicine 0.6 MG tablet Take 1 tablet (0.6 mg total) by mouth daily. Take 2 tablets at onset of pain, then may take 1 more 1 hour later if pain continues.  Marland Kitchen gabapentin (NEURONTIN) 800 MG tablet Take 800 mg by mouth 3 (three) times daily.  Marland Kitchen HYDROcodone-acetaminophen (NORCO/VICODIN) 5-325 MG tablet Take 1 tablet by mouth every 4 (four) hours as needed.  . INCRUSE ELLIPTA 62.5 MCG/INH AEPB TAKE 1 PUFF BY MOUTH EVERY DAY  . Ipratropium-Albuterol (COMBIVENT RESPIMAT) 20-100 MCG/ACT AERS respimat Inhale 1 puff into the lungs every 6 (six) hours.  . lidocaine (XYLOCAINE) 5 % ointment APPLY A SMALL AMOUNT TO AFFECTED AREA 2-3 TIMES A DAY  . linaclotide (LINZESS) 145 MCG CAPS capsule Take 145 mcg by mouth daily before breakfast.  . liraglutide (VICTOZA) 18 MG/3ML SOPN Inject 1.8 mg into the skin every morning.  Marland Kitchen lisinopril (PRINIVIL,ZESTRIL) 10 MG tablet TAKE 1 TAB DAILY IN MORNING  . meloxicam (MOBIC) 7.5 MG tablet Take 7.5 mg by mouth 2 (two) times daily.  . metoprolol tartrate (LOPRESSOR) 50 MG tablet Take 50 mg by mouth 2 (two) times  daily.  . montelukast (SINGULAIR) 10 MG tablet TAKE 1 TABLET BY MOUTH DAILY FOR ASTHMA  . phentermine 37.5 MG capsule Take 1 capsule (37.5 mg total) by mouth every morning.  . traMADol (ULTRAM) 50 MG tablet Take 50-100 mg by mouth every 8 (eight) hours as needed.   . valACYclovir (VALTREX) 1000 MG tablet Take 1,000 mg by mouth daily.   Marland Kitchen warfarin (COUMADIN) 7.5 MG tablet Take 7.5 mg by mouth daily.  . [DISCONTINUED] azithromycin (ZITHROMAX) 250 MG tablet Take one tab po qd  . [DISCONTINUED] Canagliflozin-Metformin HCl (INVOKAMET) 50-500 MG TABS Take 50-500 mg by mouth 2 (two) times daily. Take 1 tab po twice a day  . [DISCONTINUED] INVOKAMET XR 50-500 MG TB24 TAKE 1 TABLET BY MOUTH TWICE A DAY (Patient not taking: Reported on 06/04/2017)  . [DISCONTINUED] predniSONE (DELTASONE) 5 MG tablet Take 4 tablets (20 mg total) by mouth daily.  . [EXPIRED] ipratropium-albuterol (DUONEB) 0.5-2.5 (3) MG/3ML nebulizer solution 3 mL    No facility-administered encounter medications on file as of 08/08/2017.     Surgical History: Past Surgical History:  Procedure Laterality Date  . right arm surgery    . TUBAL LIGATION      Medical History: Past Medical History:  Diagnosis Date  . Asthma   . Atopic dermatitis   . Constipation   . COPD (chronic obstructive pulmonary disease) ([place])   . Diabetes mellitus without complication ([place])   .  DVT (deep venous thrombosis) ([place])   . GERD (gastroesophageal reflux disease)   . Hyperlipidemia   . Hypertension   . Rheumatoid arthritis ([place])     Family History: History reviewed. No pertinent family history.  Social History   Socioeconomic History  . Marital status: Single    Spouse name: Not on file  . Number of children: Not on file  . Years of education: Not on file  . Highest education level: Not on file  Occupational History  . Not on file  Social Needs  . Financial resource strain: Not on file  . Food insecurity:    Worry: Not on file     Inability: Not on file  . Transportation needs:    Medical: Not on file    Non-medical: Not on file  Tobacco Use  . Smoking status: Former Research scientist (life sciences)  . Smokeless tobacco: Never Used  Substance and Sexual Activity  . Alcohol use: No  . Drug use: No  . Sexual activity: Not on file  Lifestyle  . Physical activity:    Days per week: Not on file    Minutes per session: Not on file  . Stress: Not on file  Relationships  . Social connections:    Talks on phone: Not on file    Gets together: Not on file    Attends religious service: Not on file    Active member of club or organization: Not on file    Attends meetings of clubs or organizations: Not on file    Relationship status: Not on file  . Intimate partner violence:    Fear of current or ex partner: Not on file    Emotionally abused: Not on file    Physically abused: Not on file    Forced sexual activity: Not on file  Other Topics Concern  . Not on file  Social History Narrative  . Not on file   Review of Systems  Constitutional: Negative for chills, diaphoresis and fatigue.  HENT: Negative for ear pain, postnasal drip and sinus pressure.   Eyes: Negative for photophobia, discharge, redness, itching and visual disturbance.  Respiratory: Negative for cough, shortness of breath and wheezing.   Cardiovascular: Negative for chest pain, palpitations and leg swelling.  Gastrointestinal: Negative for abdominal pain, constipation, diarrhea, nausea and vomiting.  Genitourinary: Negative for dysuria and flank pain.  Musculoskeletal: Negative for arthralgias, back pain, gait problem and neck pain.  Skin: Negative for color change.  Allergic/Immunologic: Negative for environmental allergies and food allergies.  Neurological: Negative for dizziness and headaches.  Hematological: Does not bruise/bleed easily.  Psychiatric/Behavioral: Negative for agitation, behavioral problems (depression) and hallucinations.   Vital Signs: BP 110/70 (BP  Location: Left Arm, Patient Position: Sitting)   Pulse 76   Resp 16   Ht 5\' 5"  (1.651 m)   Wt 266 lb 6.4 oz (120.8 kg)   SpO2 92%   BMI 44.33 kg/m   Physical Exam  Constitutional: She is oriented to person, place, and time. She appears well-developed and well-nourished. No distress.  HENT:  Head: Normocephalic and atraumatic.  Mouth/Throat: Oropharynx is clear and moist. No oropharyngeal exudate.  Eyes: Pupils are equal, round, and reactive to light. EOM are normal.  Neck: Normal range of motion. Neck supple. No JVD present. No tracheal deviation present. No thyromegaly present.  Cardiovascular: Normal rate, regular rhythm and normal heart sounds. Exam reveals no gallop and no friction rub.  No murmur heard. Pulmonary/Chest: Effort normal. No respiratory distress. She  has no wheezes. She has no rales. She exhibits no tenderness.  Abdominal: Soft. Bowel sounds are normal.  Musculoskeletal: Normal range of motion.  Lymphadenopathy:    She has no cervical adenopathy.  Neurological: She is alert and oriented to person, place, and time. No cranial nerve deficit.  Skin: Skin is warm and dry. She is not diaphoretic.  Psychiatric: She has a normal mood and affect. Her behavior is normal. Judgment and thought content normal.   Assessment/Plan: 1. Diabetes mellitus without complication ([place]) - POCT HgB A1C  2. Personal history of venous thrombosis and embolism - POCT INR  3. Acute bronchitis with COPD ([place]) - ipratropium-albuterol (DUONEB) 0.5-2.5 (3) MG/3ML nebulizer solution 3 mL  4. Morbid obesity (HCC) - phentermine 37.5 MG capsule; Take 1 capsule (37.5 mg total) by mouth every morning.  Dispense: 30 capsule; Refill: 0  General Counseling: Hazelyn verbalizes understanding of the findings of todays visit and agrees with plan of treatment. I have discussed any further diagnostic evaluation that may be needed or ordered today. We also reviewed her medications today. she has been  encouraged to call the office with any questions or concerns that should arise related to todays visit.  Obesity Counseling: Risk Assessment: An assessment of behavioral risk factors was made today and includes lack of exercise sedentary lifestyle, lack of portion control and poor dietary habits.  Risk Modification Advice: She was counseled on portion control guidelines. Restricting daily caloric intake to. . The detrimental long term effects of obesity on her health and ongoing poor compliance was also discussed with the patient.  Orders Placed This Encounter  Procedures  . POCT INR  . POCT HgB A1C    Meds ordered this encounter  Medications  . ipratropium-albuterol (DUONEB) 0.5-2.5 (3) MG/3ML nebulizer solution 3 mL  . DISCONTD: azithromycin (ZITHROMAX) 250 MG tablet    Sig: Take one tab po qd    Dispense:  21 tablet    Refill:  0  . phentermine 37.5 MG capsule    Sig: Take 1 capsule (37.5 mg total) by mouth every morning.    Dispense:  30 capsule    Refill:  0    Time spent:25 Minutes   Dr Lavera Guise Internal medicine

## 2017-08-23 ENCOUNTER — Other Ambulatory Visit: Payer: Self-pay | Admitting: Internal Medicine

## 2017-08-24 ENCOUNTER — Other Ambulatory Visit: Payer: Self-pay | Admitting: Internal Medicine

## 2017-08-24 ENCOUNTER — Other Ambulatory Visit: Payer: Self-pay

## 2017-08-24 MED ORDER — PREDNISONE 5 MG PO TABS: 20.0000 mg | ORAL_TABLET | Freq: Every day | ORAL | 1 refills | Status: DC

## 2017-08-29 ENCOUNTER — Other Ambulatory Visit: Payer: Self-pay | Admitting: Internal Medicine

## 2017-09-06 ENCOUNTER — Ambulatory Visit (INDEPENDENT_AMBULATORY_CARE_PROVIDER_SITE_OTHER): Payer: Medicare Other | Admitting: Internal Medicine

## 2017-09-06 ENCOUNTER — Encounter: Payer: Self-pay | Admitting: Internal Medicine

## 2017-09-06 ENCOUNTER — Other Ambulatory Visit: Payer: Self-pay

## 2017-09-06 VITALS — BP 131/78 | HR 71 | Resp 16 | Ht 65.0 in | Wt 258.6 lb

## 2017-09-06 DIAGNOSIS — I4891 Unspecified atrial fibrillation: Secondary | ICD-10-CM

## 2017-09-06 DIAGNOSIS — Z7901 Long term (current) use of anticoagulants: Secondary | ICD-10-CM

## 2017-09-06 DIAGNOSIS — E1165 Type 2 diabetes mellitus with hyperglycemia: Secondary | ICD-10-CM

## 2017-09-06 DIAGNOSIS — J411 Mucopurulent chronic bronchitis: Secondary | ICD-10-CM | POA: Diagnosis not present

## 2017-09-06 DIAGNOSIS — Z86718 Personal history of other venous thrombosis and embolism: Secondary | ICD-10-CM

## 2017-09-06 DIAGNOSIS — Z6830 Body mass index (BMI) 30.0-30.9, adult: Secondary | ICD-10-CM

## 2017-09-06 LAB — POCT INR

## 2017-09-06 MED ORDER — PHENTERMINE HCL 37.5 MG PO CAPS: 37.5000 mg | ORAL_CAPSULE | ORAL | 0 refills | Status: DC

## 2017-09-06 NOTE — Addendum Note (Signed)
Addended by: Lenon Oms on: 09/06/2017 10:42 AM   Modules accepted: Orders

## 2017-09-06 NOTE — Progress Notes (Signed)
[facility] [place], [place] [address]  Internal MEDICINE  Office Visit Note  Patient Name: JOELEE Santana  [id number]  [id number]  Date of Service: 09/06/2017  Chief Complaint  Patient presents with  . Diabetes Mellitus    follow up  . Weight Loss  . Bronchitis    HPI  Pt is here for routine follow up. She is feeling better, has lost 8 lbs since last visit on weight loss program. She is still taking prednisone 30 mg for ?? COPD/ asthma. She is also on MDI. Has been on Azithromycin 250 mg qd for chronic suppressive therapy. She thinks    Current Medication: Outpatient Encounter Medications as of 09/06/2017  Medication Sig  . acyclovir ointment (ZOVIRAX) 5 % APPLY A SMALL AMOUNT TO AFFECTED AREA 3 TIMES A DAY FOR 7 DAYS  . albuterol (PROVENTIL HFA;VENTOLIN HFA) 108 (90 Base) MCG/ACT inhaler INHALE 2 PUFFS 4 TIMES A DAY AS NEEDED  . allopurinol (ZYLOPRIM) 300 MG tablet TAKE ONE TAB AT NIGHT FOR GOUT  . azithromycin (ZITHROMAX) 250 MG tablet TAKE 1 TABLET BY MOUTH EVERY DAY  . budesonide-formoterol (SYMBICORT) 160-4.5 MCG/ACT inhaler Inhale 2 puffs into the lungs 2 (two) times daily.  . chlorthalidone (HYGROTON) 25 MG tablet Take 25 mg by mouth daily.  . colchicine 0.6 MG tablet Take 1 tablet (0.6 mg total) by mouth daily. Take 2 tablets at onset of pain, then may take 1 more 1 hour later if pain continues.  Marland Kitchen gabapentin (NEURONTIN) 800 MG tablet Take 800 mg by mouth 3 (three) times daily.  Marland Kitchen HYDROcodone-acetaminophen (NORCO/VICODIN) 5-325 MG tablet Take 1 tablet by mouth every 4 (four) hours as needed.  . INCRUSE ELLIPTA 62.5 MCG/INH AEPB TAKE 1 PUFF BY MOUTH EVERY DAY  . INVOKAMET 50-500 MG TABS TAKE 1 TABLET BY MOUTH TWICE A DAY  . Ipratropium-Albuterol (COMBIVENT RESPIMAT) 20-100 MCG/ACT AERS respimat Inhale 1 puff into the lungs every 6 (six) hours.  . lidocaine (XYLOCAINE) 5 % ointment APPLY A SMALL AMOUNT TO AFFECTED AREA 2-3 TIMES A DAY  .  linaclotide (LINZESS) 145 MCG CAPS capsule Take 145 mcg by mouth daily before breakfast.  . liraglutide (VICTOZA) 18 MG/3ML SOPN Inject 1.8 mg into the skin every morning.  Marland Kitchen lisinopril (PRINIVIL,ZESTRIL) 10 MG tablet TAKE 1 TAB DAILY IN MORNING  . meloxicam (MOBIC) 7.5 MG tablet Take 7.5 mg by mouth 2 (two) times daily.  . metoprolol tartrate (LOPRESSOR) 50 MG tablet Take 50 mg by mouth 2 (two) times daily.  . montelukast (SINGULAIR) 10 MG tablet TAKE 1 TABLET BY MOUTH DAILY FOR ASTHMA  . phentermine 37.5 MG capsule Take 1 capsule (37.5 mg total) by mouth every morning.  . predniSONE (DELTASONE) 5 MG tablet Take 4 tablets (20 mg total) by mouth daily.  . traMADol (ULTRAM) 50 MG tablet Take 50-100 mg by mouth every 8 (eight) hours as needed.   . valACYclovir (VALTREX) 1000 MG tablet Take 1,000 mg by mouth daily.   Marland Kitchen warfarin (COUMADIN) 7.5 MG tablet Take 7.5 mg by mouth daily.  . [DISCONTINUED] phentermine 37.5 MG capsule Take 1 capsule (37.5 mg total) by mouth every morning.   No facility-administered encounter medications on file as of 09/06/2017.     Surgical History: Past Surgical History:  Procedure Laterality Date  . right arm surgery    . TUBAL LIGATION      Medical History: Past Medical History:  Diagnosis Date  . Asthma   . Atopic dermatitis   .  Constipation   . COPD (chronic obstructive pulmonary disease) ([place])   . Diabetes mellitus without complication ([place])   . DVT (deep venous thrombosis) ([place])   . GERD (gastroesophageal reflux disease)   . Hyperlipidemia   . Hypertension   . Rheumatoid arthritis ([place])     Family History: History reviewed. No pertinent family history.  Social History   Socioeconomic History  . Marital status: Single    Spouse name: Not on file  . Number of children: Not on file  . Years of education: Not on file  . Highest education level: Not on file  Occupational History  . Not on file  Social Needs  . Financial resource strain: Not on  file  . Food insecurity:    Worry: Not on file    Inability: Not on file  . Transportation needs:    Medical: Not on file    Non-medical: Not on file  Tobacco Use  . Smoking status: Former Research scientist (life sciences)  . Smokeless tobacco: Never Used  Substance and Sexual Activity  . Alcohol use: No  . Drug use: No  . Sexual activity: Not on file  Lifestyle  . Physical activity:    Days per week: Not on file    Minutes per session: Not on file  . Stress: Not on file  Relationships  . Social connections:    Talks on phone: Not on file    Gets together: Not on file    Attends religious service: Not on file    Active member of club or organization: Not on file    Attends meetings of clubs or organizations: Not on file    Relationship status: Not on file  . Intimate partner violence:    Fear of current or ex partner: Not on file    Emotionally abused: Not on file    Physically abused: Not on file    Forced sexual activity: Not on file  Other Topics Concern  . Not on file  Social History Narrative  . Not on file    Review of Systems  Constitutional: Negative for chills, diaphoresis and fatigue.  HENT: Negative for ear pain, postnasal drip and sinus pressure.   Eyes: Negative for photophobia, discharge, redness, itching and visual disturbance.  Respiratory: Positive for cough and shortness of breath. Negative for wheezing.   Cardiovascular: Negative for chest pain, palpitations and leg swelling.  Gastrointestinal: Negative for abdominal pain, constipation, diarrhea, nausea and vomiting.  Genitourinary: Negative for dysuria and flank pain.  Musculoskeletal: Negative for arthralgias, back pain, gait problem and neck pain.  Skin: Negative for color change.  Allergic/Immunologic: Negative for environmental allergies and food allergies.  Neurological: Negative for dizziness and headaches.  Hematological: Does not bruise/bleed easily.  Psychiatric/Behavioral: Negative for agitation, behavioral  problems (depression) and hallucinations.   Vital Signs: BP 131/78 (BP Location: Left Arm, Patient Position: Sitting, Cuff Size: Large)   Pulse 71   Resp 16   Ht '5\' 5"'  (1.651 m)   Wt 258 lb 9.6 oz (117.3 kg)   SpO2 99%   BMI 43.03 kg/m   Physical Exam  Constitutional: She is oriented to person, place, and time. She appears well-developed and well-nourished. No distress.  HENT:  Head: Normocephalic and atraumatic.  Mouth/Throat: Oropharynx is clear and moist. No oropharyngeal exudate.  Eyes: Pupils are equal, round, and reactive to light. EOM are normal.  Neck: Normal range of motion. Neck supple. No JVD present. No tracheal deviation present. No thyromegaly present.  Cardiovascular:  Normal rate, regular rhythm and normal heart sounds. Exam reveals no gallop and no friction rub.  No murmur heard. Pulmonary/Chest: Effort normal. No respiratory distress. She has wheezes. She has no rales. She exhibits no tenderness.  Abdominal: Soft. Bowel sounds are normal.  Musculoskeletal: Normal range of motion.  Lymphadenopathy:    She has no cervical adenopathy.  Neurological: She is alert and oriented to person, place, and time. No cranial nerve deficit.  Skin: Skin is warm and dry. She is not diaphoretic.  Psychiatric: She has a normal mood and affect. Her behavior is normal. Judgment and thought content normal.   Assessment/Plan: 1. Mucopurulent chronic bronchitis (HCC) - Continue Azithromycin qd, taper prednisone, will need to be on inhaled steroids  - Pulmonary Function Test; Future  2. Morbid obesity (HCC) - Lost 8 lbs, will continue - phentermine 37.5 MG capsule; Take 1 capsule (37.5 mg total) by mouth every morning.  Dispense: 30 capsule; Refill: 0  3. Personal history of venous thrombosis and embolism - POCT INR 2.8   4. Chronic anticoagulation - Continue Coumadin 7.5 mg po qd  5. Uncontrolled type 2 diabetes mellitus with hyperglycemia (HCC) - Continue Victoza and invokana-  met   General Counseling: Brenda Santana verbalizes understanding of the findings of todays visit and agrees with plan of treatment. I have discussed any further diagnostic evaluation that may be needed or ordered today. We also reviewed her medications today. she has been encouraged to call the office with any questions or concerns that should arise related to todays visit. Obesity Counseling: Risk Assessment: An assessment of behavioral risk factors was made today and includes lack of exercise sedentary lifestyle, lack of portion control and poor dietary habits.  Risk Modification Advice: She was counseled on portion control guidelines. Restricting daily caloric intake to. . The detrimental long term effects of obesity on her health and ongoing poor compliance was also discussed with the patient.    Orders Placed This Encounter  Procedures  . POCT INR  . Pulmonary Function Test    Meds ordered this encounter  Medications  . phentermine 37.5 MG capsule    Sig: Take 1 capsule (37.5 mg total) by mouth every morning.    Dispense:  30 capsule    Refill:  0    Time spent:25 Minutes  Dr Lavera Guise Internal medicine

## 2017-09-11 ENCOUNTER — Other Ambulatory Visit: Payer: Self-pay | Admitting: Internal Medicine

## 2017-09-12 ENCOUNTER — Other Ambulatory Visit: Payer: Self-pay | Admitting: Internal Medicine

## 2017-09-22 ENCOUNTER — Other Ambulatory Visit: Payer: Self-pay | Admitting: Nurse Practitioner

## 2017-09-22 MED ORDER — VALACYCLOVIR HCL 1 G PO TABS: 1000.0000 mg | ORAL_TABLET | Freq: Every day | ORAL | 3 refills | Status: DC

## 2017-09-28 ENCOUNTER — Other Ambulatory Visit: Payer: Self-pay | Admitting: Nurse Practitioner

## 2017-09-28 MED ORDER — AZITHROMYCIN 250 MG PO TABS: ORAL_TABLET | ORAL | 0 refills | Status: DC

## 2017-10-03 ENCOUNTER — Other Ambulatory Visit: Payer: Self-pay | Admitting: Internal Medicine

## 2017-10-06 ENCOUNTER — Ambulatory Visit (INDEPENDENT_AMBULATORY_CARE_PROVIDER_SITE_OTHER): Payer: Medicare Other | Admitting: Nurse Practitioner

## 2017-10-06 ENCOUNTER — Encounter: Payer: Self-pay | Admitting: Nurse Practitioner

## 2017-10-06 VITALS — BP 136/74 | HR 82 | Resp 16 | Ht 65.0 in | Wt 258.4 lb

## 2017-10-06 DIAGNOSIS — Z86718 Personal history of other venous thrombosis and embolism: Secondary | ICD-10-CM | POA: Insufficient documentation

## 2017-10-06 DIAGNOSIS — J411 Mucopurulent chronic bronchitis: Secondary | ICD-10-CM

## 2017-10-06 DIAGNOSIS — E119 Type 2 diabetes mellitus without complications: Secondary | ICD-10-CM | POA: Diagnosis not present

## 2017-10-06 DIAGNOSIS — I10 Essential (primary) hypertension: Secondary | ICD-10-CM | POA: Diagnosis not present

## 2017-10-06 DIAGNOSIS — Z7901 Long term (current) use of anticoagulants: Secondary | ICD-10-CM

## 2017-10-06 LAB — POCT INR: INR: 3.6 — AB (ref 2.0–3.0)

## 2017-10-06 MED ORDER — PHENTERMINE HCL 37.5 MG PO CAPS: 37.5000 mg | ORAL_CAPSULE | ORAL | 0 refills | Status: DC

## 2017-10-06 NOTE — Progress Notes (Signed)
[facility] [place], [place] [address]  Internal MEDICINE  Office Visit Note  Patient Name: Brenda Santana  [id number]  [id number]  Date of Service: 10/06/2017   Pt is here for routine follow up.   Chief Complaint  Patient presents with  . Weight Loss    follow up  . Dizziness    occurred on monday    The patient has had steady weight over last 30 days but has lost 8 pounds over last 2 months. She has weaned herself off of prednisone and is no longer on this for maintenance dosing. Blood sugars are doing well. She has noted no negative side effects from taking phentermine.  She had single episode of dizziness on Monday. Had to lay back down after getting up as dizziness was so significant. The symptoms resolved and she has no further episodes. She does have some congestion and cough. Has chronic allergies and copd.       Current Medication: Outpatient Encounter Medications as of 10/06/2017  Medication Sig  . acyclovir ointment (ZOVIRAX) 5 % APPLY A SMALL AMOUNT TO AFFECTED AREA 3 TIMES A DAY FOR 7 DAYS  . albuterol (PROVENTIL HFA;VENTOLIN HFA) 108 (90 Base) MCG/ACT inhaler INHALE 2 PUFFS 4 TIMES A DAY AS NEEDED  . allopurinol (ZYLOPRIM) 300 MG tablet TAKE ONE TAB AT NIGHT FOR GOUT  . azithromycin (ZITHROMAX) 250 MG tablet TAKE 1 TABLET BY MOUTH EVERY DAY  . chlorthalidone (HYGROTON) 25 MG tablet TAKE 1 DAILY EVERY MORNING  . colchicine 0.6 MG tablet Take 1 tablet (0.6 mg total) by mouth daily. Take 2 tablets at onset of pain, then may take 1 more 1 hour later if pain continues.  Marland Kitchen gabapentin (NEURONTIN) 800 MG tablet Take 800 mg by mouth 3 (three) times daily.  Marland Kitchen HYDROcodone-acetaminophen (NORCO/VICODIN) 5-325 MG tablet Take 1 tablet by mouth every 4 (four) hours as needed.  . INCRUSE ELLIPTA 62.5 MCG/INH AEPB TAKE 1 PUFF BY MOUTH EVERY DAY  . INVOKAMET 50-500 MG TABS TAKE 1 TABLET BY MOUTH TWICE A DAY  . ipratropium-albuterol (DUONEB) 0.5-2.5 (3) MG/3ML  SOLN Take 3 mLs by nebulization every 6 (six) hours as needed.  . lidocaine (XYLOCAINE) 5 % ointment APPLY A SMALL AMOUNT TO AFFECTED AREA 2-3 TIMES A DAY  . linaclotide (LINZESS) 145 MCG CAPS capsule Take 145 mcg by mouth daily before breakfast.  . liraglutide (VICTOZA) 18 MG/3ML SOPN Inject 1.8 mg into the skin every morning.  Marland Kitchen lisinopril (PRINIVIL,ZESTRIL) 10 MG tablet TAKE 1 TAB DAILY IN MORNING  . meloxicam (MOBIC) 7.5 MG tablet Take 7.5 mg by mouth 2 (two) times daily.  . metoprolol tartrate (LOPRESSOR) 50 MG tablet Take 50 mg by mouth 2 (two) times daily.  . montelukast (SINGULAIR) 10 MG tablet TAKE 1 TABLET BY MOUTH DAILY FOR ASTHMA  . phentermine 37.5 MG capsule Take 1 capsule (37.5 mg total) by mouth every morning.  . traMADol (ULTRAM) 50 MG tablet Take 50-100 mg by mouth every 8 (eight) hours as needed.   . valACYclovir (VALTREX) 1000 MG tablet Take 1 tablet (1,000 mg total) by mouth daily.  Marland Kitchen warfarin (COUMADIN) 7.5 MG tablet Take 7.5 mg by mouth daily.  . [DISCONTINUED] phentermine 37.5 MG capsule Take 1 capsule (37.5 mg total) by mouth every morning.  . predniSONE (DELTASONE) 5 MG tablet TAKE 2 TABLETS BY MOUTH EVERY DAY (Patient not taking: Reported on 10/06/2017)   No facility-administered encounter medications on file as of 10/06/2017.  Surgical History: Past Surgical History:  Procedure Laterality Date  . right arm surgery    . TUBAL LIGATION      Medical History: Past Medical History:  Diagnosis Date  . Asthma   . Atopic dermatitis   . Constipation   . COPD (chronic obstructive pulmonary disease) ([place])   . Diabetes mellitus without complication ([place])   . DVT (deep venous thrombosis) ([place])   . GERD (gastroesophageal reflux disease)   . Hyperlipidemia   . Hypertension   . Rheumatoid arthritis ([place])     Family History: History reviewed. No pertinent family history.  Social History   Socioeconomic History  . Marital status: Single    Spouse name: Not on  file  . Number of children: Not on file  . Years of education: Not on file  . Highest education level: Not on file  Occupational History  . Not on file  Social Needs  . Financial resource strain: Not on file  . Food insecurity:    Worry: Not on file    Inability: Not on file  . Transportation needs:    Medical: Not on file    Non-medical: Not on file  Tobacco Use  . Smoking status: Former Research scientist (life sciences)  . Smokeless tobacco: Never Used  Substance and Sexual Activity  . Alcohol use: No  . Drug use: No  . Sexual activity: Not on file  Lifestyle  . Physical activity:    Days per week: Not on file    Minutes per session: Not on file  . Stress: Not on file  Relationships  . Social connections:    Talks on phone: Not on file    Gets together: Not on file    Attends religious service: Not on file    Active member of club or organization: Not on file    Attends meetings of clubs or organizations: Not on file    Relationship status: Not on file  . Intimate partner violence:    Fear of current or ex partner: Not on file    Emotionally abused: Not on file    Physically abused: Not on file    Forced sexual activity: Not on file  Other Topics Concern  . Not on file  Social History Narrative  . Not on file      Review of Systems  Constitutional: Negative for activity change, chills, diaphoresis, fatigue and unexpected weight change.  HENT: Positive for congestion. Negative for ear pain, postnasal drip and sinus pressure.   Eyes: Negative.  Negative for photophobia, discharge, redness, itching and visual disturbance.  Respiratory: Positive for cough and shortness of breath. Negative for wheezing.   Cardiovascular: Negative for chest pain, palpitations and leg swelling.  Gastrointestinal: Negative for abdominal pain, constipation, diarrhea, nausea and vomiting.  Endocrine: Negative for cold intolerance, heat intolerance, polydipsia, polyphagia and polyuria.  Genitourinary: Negative for  dysuria and flank pain.  Musculoskeletal: Negative for arthralgias, back pain, gait problem and neck pain.  Skin: Negative for color change and rash.  Allergic/Immunologic: Positive for environmental allergies. Negative for food allergies.  Neurological: Positive for dizziness and headaches.  Hematological: Does not bruise/bleed easily.  Psychiatric/Behavioral: Negative for agitation, behavioral problems (depression) and hallucinations.    Today's Vitals   10/06/17 0953  BP: 136/74  Pulse: 82  Resp: 16  SpO2: 97%  Weight: 258 lb 6.4 oz (117.2 kg)  Height: 5\' 5"  (1.651 m)    Physical Exam  Constitutional: She is oriented to person, place, and  time. She appears well-developed and well-nourished. No distress.  HENT:  Head: Normocephalic and atraumatic.  Mouth/Throat: Oropharynx is clear and moist. No oropharyngeal exudate.  Right ear canal has small amount of redness and swelling.   Eyes: Pupils are equal, round, and reactive to light. EOM are normal.  Neck: Normal range of motion. Neck supple. No JVD present. No tracheal deviation present. No thyromegaly present.  Cardiovascular: Normal rate, regular rhythm and normal heart sounds. Exam reveals no gallop and no friction rub.  No murmur heard. Pulmonary/Chest: Effort normal. No respiratory distress. She has wheezes. She has no rales. She exhibits no tenderness.  Congested, non-productive cough  Abdominal: Soft. Bowel sounds are normal. There is no tenderness.  Musculoskeletal: Normal range of motion.  Lymphadenopathy:    She has no cervical adenopathy.  Neurological: She is alert and oriented to person, place, and time. No cranial nerve deficit.  Skin: Skin is warm and dry. She is not diaphoretic.  Psychiatric: She has a normal mood and affect. Her behavior is normal. Judgment and thought content normal.  Nursing note and vitals reviewed.  Assessment/Plan:  1. Essential (primary) hypertension Stable. Continue bp medication as  prescribed.   2. Mucopurulent chronic bronchitis (HCC) Continue inhalers and respiratory medications as prescribed. May consider taking 1 prednisone daily to help reduce inflammation in the lungs and mucus production.   3. Morbid obesity ([place]) Improved. Continue phentermine 37.5mg  tablets daily. Limit calorie intake to 1500 calories per day and increased, low-impact exercise.  - phentermine 37.5 MG capsule; Take 1 capsule (37.5 mg total) by mouth every morning.  Dispense: 30 capsule; Refill: 0  4. Diabetes mellitus without complication (HCC) Last HYIF0Y 6.8. Continue all diabetic medication as prescribed.   5. Personal history of venous thrombosis and embolism - POCT INR 3.5 today. Takes warfarin in the mornings. Continue as previously prescribed.   6. Chronic anticoagulation Warfarin therapy as prescribed.    General Counseling: kahlani graber understanding of the findings of todays visit and agrees with plan of treatment. I have discussed any further diagnostic evaluation that may be needed or ordered today. We also reviewed her medications today. she has been encouraged to call the office with any questions or concerns that should arise related to todays visit.    Counseling:   There is a liability release in patients' chart. There has been a 10 minute discussion about the side effects including but not limited to elevated blood pressure, anxiety, lack of sleep and dry mouth. Pt understands and will like to start/continue on appetite suppressant at this time. There will be one month RX given at the time of visit with proper follow up. [place] diet plan with restricted calories is given to the pt. Pt understands and agrees with  plan of treatment  This patient was seen by Leretha Pol, FNP- C in Collaboration with Dr Lavera Guise as a part of collaborative care agreement  Orders Placed This Encounter  Procedures  . POCT INR    Meds ordered this encounter  Medications  .  phentermine 37.5 MG capsule    Sig: Take 1 capsule (37.5 mg total) by mouth every morning.    Dispense:  30 capsule    Refill:  0    Order Specific Question:   Supervising Provider    Answer:   Lavera Guise [[id number]]    Time spent: 63 Minutes     Dr Lavera Guise Internal medicine

## 2017-10-11 ENCOUNTER — Ambulatory Visit (INDEPENDENT_AMBULATORY_CARE_PROVIDER_SITE_OTHER): Payer: Medicare Other | Admitting: Internal Medicine

## 2017-10-11 DIAGNOSIS — R0602 Shortness of breath: Secondary | ICD-10-CM | POA: Diagnosis not present

## 2017-10-11 LAB — PULMONARY FUNCTION TEST

## 2017-10-17 ENCOUNTER — Other Ambulatory Visit: Payer: Self-pay

## 2017-10-17 MED ORDER — AZITHROMYCIN 250 MG PO TABS: ORAL_TABLET | ORAL | 0 refills | Status: DC

## 2017-10-19 ENCOUNTER — Other Ambulatory Visit: Payer: Self-pay | Admitting: Internal Medicine

## 2017-10-22 ENCOUNTER — Other Ambulatory Visit: Payer: Self-pay | Admitting: Internal Medicine

## 2017-10-31 NOTE — Procedures (Signed)
[place], [address]  DATE OF SERVICE:   October 11, 2017  Complete Pulmonary Function Testing Interpretation:  FINDINGS:   the forced vital capacity is normal.  The FEV1 is normal.  The FEV1 FVC ratio is normal.  Post bronchodilator there is no significant change in the FEV1 however clinical improvement right car in the absence of spirometric improvement.  Total lung capacity is within normal limits.  Residual volume is increased residual volume total lung capacity ratio is increased the FRC is within normal limits.  The DLCO is mildly decreased but normal when corrected for alveolar volume.  IMPRESSION:    This pulmonary function studies within normal limits.  DLCO is within normal limits when corrected for alveolar volume.  Factors affecting the DLCO wean cleared interstitial lung disease COPD Pulmonary circulatory disease and ongoing tobacco use.  Clinical correlation is recommended  Allyne Gee, MD [facility] Sleep Medicine

## 2017-11-09 ENCOUNTER — Other Ambulatory Visit: Payer: Self-pay | Admitting: Internal Medicine

## 2017-11-09 ENCOUNTER — Encounter: Payer: Self-pay | Admitting: Nurse Practitioner

## 2017-11-09 ENCOUNTER — Ambulatory Visit (INDEPENDENT_AMBULATORY_CARE_PROVIDER_SITE_OTHER): Payer: Medicare Other | Admitting: Nurse Practitioner

## 2017-11-09 VITALS — BP 109/72 | HR 89 | Ht 65.0 in | Wt 251.4 lb

## 2017-11-09 DIAGNOSIS — J411 Mucopurulent chronic bronchitis: Secondary | ICD-10-CM

## 2017-11-09 DIAGNOSIS — E1165 Type 2 diabetes mellitus with hyperglycemia: Secondary | ICD-10-CM | POA: Insufficient documentation

## 2017-11-09 DIAGNOSIS — B009 Herpesviral infection, unspecified: Secondary | ICD-10-CM | POA: Insufficient documentation

## 2017-11-09 LAB — POCT GLYCOSYLATED HEMOGLOBIN (HGB A1C): Hemoglobin A1C: 7.3 % — AB (ref 4.0–5.6)

## 2017-11-09 MED ORDER — PNEUMOCOCCAL VAC POLYVALENT 25 MCG/0.5ML IJ INJ: 0.5000 mL | INJECTION | INTRAMUSCULAR | 0 refills | Status: AC

## 2017-11-09 MED ORDER — ACYCLOVIR 5 % EX OINT: TOPICAL_OINTMENT | CUTANEOUS | 3 refills | Status: DC

## 2017-11-09 MED ORDER — PHENTERMINE HCL 37.5 MG PO CAPS: 37.5000 mg | ORAL_CAPSULE | ORAL | 1 refills | Status: DC

## 2017-11-09 MED ORDER — ACYCLOVIR 400 MG PO TABS: ORAL_TABLET | ORAL | 3 refills | Status: DC

## 2017-11-09 NOTE — Progress Notes (Signed)
[facility] [place], [place] [address]  Internal MEDICINE  Office Visit Note  Patient Name: Brenda Santana  [id number]  [id number]  Date of Service: 11/09/2017   Pt is here for routine follow up.   Chief Complaint  Patient presents with  . Diabetes  . Weight Loss    The patient was started on phentermine at her last visit. Doing well. Has lost 7 pounds. Blood pressure and blood sugars are improved.    Diabetes  She presents for her follow-up diabetic visit. She has type 2 diabetes mellitus. No MedicAlert identification noted. Her disease course has been improving. There are no hypoglycemic associated symptoms. Pertinent negatives for hypoglycemia include no dizziness or headaches. Associated symptoms include fatigue. Pertinent negatives for diabetes include no chest pain, no polydipsia, no polyphagia and no polyuria. There are no hypoglycemic complications. Symptoms are improving. Diabetic complications include peripheral neuropathy. Risk factors for coronary artery disease include obesity, post-menopausal, tobacco exposure, diabetes mellitus and dyslipidemia. Current diabetic treatment includes oral agent (dual therapy) (victoza). She is compliant with treatment most of the time. Her weight is decreasing steadily. She is following a generally healthy diet. Meal planning includes avoidance of concentrated sweets. She has had a previous visit with a dietitian. She participates in exercise three times a week. Her home blood glucose trend is decreasing steadily. An ACE inhibitor/angiotensin II receptor blocker is not being taken.       Current Medication: Outpatient Encounter Medications as of 11/09/2017  Medication Sig  . acyclovir ointment (ZOVIRAX) 5 % Apply topically every 3 (three) hours.  Marland Kitchen albuterol (PROVENTIL HFA;VENTOLIN HFA) 108 (90 Base) MCG/ACT inhaler INHALE 2 PUFFS 4 TIMES A DAY AS NEEDED  . allopurinol (ZYLOPRIM) 300 MG tablet TAKE ONE TAB AT  NIGHT FOR GOUT  . chlorthalidone (HYGROTON) 25 MG tablet TAKE 1 DAILY EVERY MORNING  . colchicine 0.6 MG tablet Take 1 tablet (0.6 mg total) by mouth daily. Take 2 tablets at onset of pain, then may take 1 more 1 hour later if pain continues.  Marland Kitchen gabapentin (NEURONTIN) 800 MG tablet Take 800 mg by mouth 3 (three) times daily.  . INCRUSE ELLIPTA 62.5 MCG/INH AEPB TAKE 1 PUFF BY MOUTH EVERY DAY  . INVOKAMET 50-500 MG TABS TAKE 1 TABLET BY MOUTH TWICE A DAY  . ipratropium-albuterol (DUONEB) 0.5-2.5 (3) MG/3ML SOLN Take 3 mLs by nebulization every 6 (six) hours as needed.  . lidocaine (XYLOCAINE) 5 % ointment APPLY A SMALL AMOUNT TO AFFECTED AREA 2-3 TIMES A DAY  . LINZESS 145 MCG CAPS capsule TAKE ONE CAPSULE BY MOUTH ONCE DAILY FOR CONSTIPATION  . liraglutide (VICTOZA) 18 MG/3ML SOPN Inject 1.8 mg into the skin every morning.  Marland Kitchen lisinopril (PRINIVIL,ZESTRIL) 10 MG tablet TAKE 1 TAB DAILY IN MORNING  . meloxicam (MOBIC) 7.5 MG tablet Take 7.5 mg by mouth 2 (two) times daily.  . metoprolol tartrate (LOPRESSOR) 50 MG tablet TAKE 1 TABLET BY MOUTH TWICE A DAY  . montelukast (SINGULAIR) 10 MG tablet TAKE 1 TABLET BY MOUTH DAILY FOR ASTHMA  . phentermine 37.5 MG capsule Take 1 capsule (37.5 mg total) by mouth every morning.  . traMADol (ULTRAM) 50 MG tablet Take 50-100 mg by mouth every 8 (eight) hours as needed.   . warfarin (COUMADIN) 7.5 MG tablet Take 7.5 mg by mouth daily.  . [DISCONTINUED] acyclovir ointment (ZOVIRAX) 5 % APPLY A SMALL AMOUNT TO AFFECTED AREA 3 TIMES A DAY FOR 7 DAYS  . [DISCONTINUED] phentermine  37.5 MG capsule Take 1 capsule (37.5 mg total) by mouth every morning.  . [DISCONTINUED] pneumococcal 23 valent vaccine (PNEUMOVAX 23) 25 MCG/0.5ML injection Inject 0.5 mLs into the muscle tomorrow at 10 am.  . acyclovir (ZOVIRAX) 400 MG tablet Take 1 tablet po TID for 10 days then take twice daily  . azithromycin (ZITHROMAX) 250 MG tablet TAKE 1 TABLET BY MOUTH EVERY DAY (Patient not  taking: Reported on 11/09/2017)  . HYDROcodone-acetaminophen (NORCO/VICODIN) 5-325 MG tablet Take 1 tablet by mouth every 4 (four) hours as needed. (Patient not taking: Reported on 11/09/2017)  . predniSONE (DELTASONE) 5 MG tablet TAKE 2 TABLETS BY MOUTH EVERY DAY (Patient not taking: Reported on 10/06/2017)  . [DISCONTINUED] valACYclovir (VALTREX) 1000 MG tablet Take 1 tablet (1,000 mg total) by mouth daily.   No facility-administered encounter medications on file as of 11/09/2017.     Surgical History: Past Surgical History:  Procedure Laterality Date  . right arm surgery    . TUBAL LIGATION      Medical History: Past Medical History:  Diagnosis Date  . Asthma   . Atopic dermatitis   . Constipation   . COPD (chronic obstructive pulmonary disease) ([place])   . Diabetes mellitus without complication ([place])   . DVT (deep venous thrombosis) ([place])   . GERD (gastroesophageal reflux disease)   . Hyperlipidemia   . Hypertension   . Rheumatoid arthritis ([place])     Family History: History reviewed. No pertinent family history.  Social History   Socioeconomic History  . Marital status: Single    Spouse name: Not on file  . Number of children: Not on file  . Years of education: Not on file  . Highest education level: Not on file  Occupational History  . Not on file  Social Needs  . Financial resource strain: Not on file  . Food insecurity:    Worry: Not on file    Inability: Not on file  . Transportation needs:    Medical: Not on file    Non-medical: Not on file  Tobacco Use  . Smoking status: Former Research scientist (life sciences)  . Smokeless tobacco: Never Used  Substance and Sexual Activity  . Alcohol use: No  . Drug use: No  . Sexual activity: Not on file  Lifestyle  . Physical activity:    Days per week: Not on file    Minutes per session: Not on file  . Stress: Not on file  Relationships  . Social connections:    Talks on phone: Not on file    Gets together: Not on file    Attends  religious service: Not on file    Active member of club or organization: Not on file    Attends meetings of clubs or organizations: Not on file    Relationship status: Not on file  . Intimate partner violence:    Fear of current or ex partner: Not on file    Emotionally abused: Not on file    Physically abused: Not on file    Forced sexual activity: Not on file  Other Topics Concern  . Not on file  Social History Narrative  . Not on file      Review of Systems  Constitutional: Positive for fatigue. Negative for activity change, chills, diaphoresis, fever and unexpected weight change.       Weight loss 7 pounds since most recent visit.   HENT: Negative for congestion, ear pain, postnasal drip, sinus pressure, sinus pain and sore throat.  Eyes: Negative.  Negative for photophobia, discharge, redness, itching and visual disturbance.  Respiratory: Negative for cough, shortness of breath and wheezing.        Uses inhalers as needed and as prescribed   Cardiovascular: Negative for chest pain, palpitations and leg swelling.  Gastrointestinal: Negative for abdominal pain, constipation, diarrhea, nausea and vomiting.  Endocrine: Negative for cold intolerance, heat intolerance, polydipsia, polyphagia and polyuria.       Improving blood sugars.   Genitourinary: Negative for dysuria and flank pain.       Vaginal herpes lesions which are persistent and not getting better with oral valacyclovir.   Musculoskeletal: Negative for arthralgias, back pain, gait problem and neck pain.  Skin: Negative for color change and rash.  Allergic/Immunologic: Positive for environmental allergies. Negative for food allergies.  Neurological: Negative for dizziness and headaches.  Hematological: Negative for adenopathy. Does not bruise/bleed easily.  Psychiatric/Behavioral: Negative for agitation, behavioral problems (depression) and hallucinations.    Today's Vitals   11/09/17 0850  BP: 109/72  Pulse: 89   SpO2: 91%  Weight: 251 lb 6.4 oz (114 kg)  Height: 5\' 5"  (1.651 m)    Physical Exam  Constitutional: She is oriented to person, place, and time. She appears well-developed and well-nourished. No distress.  HENT:  Head: Normocephalic and atraumatic.  Mouth/Throat: Oropharynx is clear and moist. No oropharyngeal exudate.  Eyes: Pupils are equal, round, and reactive to light. Conjunctivae and EOM are normal.  Neck: Normal range of motion. Neck supple. No JVD present. No tracheal deviation present. No thyromegaly present.  Cardiovascular: Normal rate, regular rhythm and normal heart sounds. Exam reveals no gallop and no friction rub.  No murmur heard. Pulmonary/Chest: Effort normal and breath sounds normal. No respiratory distress. She has no wheezes. She has no rales. She exhibits no tenderness.  Abdominal: Soft. Bowel sounds are normal. There is no tenderness.  Musculoskeletal: Normal range of motion.  Lymphadenopathy:    She has no cervical adenopathy.  Neurological: She is alert and oriented to person, place, and time. No cranial nerve deficit.  Skin: Skin is warm and dry. Capillary refill takes 2 to 3 seconds. She is not diaphoretic.  Psychiatric: She has a normal mood and affect. Her behavior is normal. Judgment and thought content normal.  Nursing note and vitals reviewed.  Assessment/Plan: 1. Uncontrolled type 2 diabetes mellitus with hyperglycemia (HCC) - POCT HgB A1C 7.3 today, down from 8.2 at mot recent check. Continue diabetic medications as prescribed. Continue helthy diet and increased exercise to help with weight loss and better blood sugar control.   2. Herpes simplex Change valacyclovir to acyclovir 400mg . Will take three times daily for 10 days, then drop to twice daily to prevent further flare ups. cotinue acyclovir topical cream as needed.  - acyclovir (ZOVIRAX) 400 MG tablet; Take 1 tablet po TID for 10 days then take twice daily  Dispense: 70 tablet; Refill: 3 -  acyclovir ointment (ZOVIRAX) 5 %; Apply topically every 3 (three) hours.  Dispense: 60 g; Refill: 3  3. Morbid obesity ([place]) Improving. Continue phentermine daily. Limit calorie intake to 1500 calories per day and continue to gradually increase cardiovascular exercise.  - phentermine 37.5 MG capsule; Take 1 capsule (37.5 mg total) by mouth every morning.  Dispense: 30 capsule; Refill: 1  4. Mucopurulent chronic bronchitis (HCC) Continue inhalers and respiratory medications as prescribed .  General Counseling: kailie polus understanding of the findings of todays visit and agrees with plan of treatment. I  have discussed any further diagnostic evaluation that may be needed or ordered today. We also reviewed her medications today. she has been encouraged to call the office with any questions or concerns that should arise related to todays visit.    Counseling:  Diabetes Counseling:  1. Addition of ACE inh/ ARB'S for nephroprotection. Microalbumin is updated  2. Diabetic foot care, prevention of complications. Podiatry consult 3. Exercise and lose weight.  4. Diabetic eye examination, Diabetic eye exam is updated  5. Monitor blood sugar closlely. nutrition counseling.  6. Sign and symptoms of hypoglycemia including shaking sweating,confusion and headaches.   There is a liability release in patients' chart. There has been a 10 minute discussion about the side effects including but not limited to elevated blood pressure, anxiety, lack of sleep and dry mouth. Pt understands and will like to start/continue on appetite suppressant at this time. There will be one month RX given at the time of visit with proper follow up. [place] diet plan with restricted calories is given to the pt. Pt understands and agrees with  plan of treatment  This patient was seen by Leretha Pol, FNP- C in Collaboration with Dr Lavera Guise as a part of collaborative care agreement  Orders Placed This Encounter   Procedures  . POCT HgB A1C    Meds ordered this encounter  Medications  . acyclovir (ZOVIRAX) 400 MG tablet    Sig: Take 1 tablet po TID for 10 days then take twice daily    Dispense:  70 tablet    Refill:  3    Order Specific Question:   Supervising Provider    Answer:   Lavera Guise [place]  . acyclovir ointment (ZOVIRAX) 5 %    Sig: Apply topically every 3 (three) hours.    Dispense:  60 g    Refill:  3    Order Specific Question:   Supervising Provider    Answer:   Lavera Guise [[id number]]  . phentermine 37.5 MG capsule    Sig: Take 1 capsule (37.5 mg total) by mouth every morning.    Dispense:  30 capsule    Refill:  1    Order Specific Question:   Supervising Provider    Answer:   Lavera Guise [[id number]]    Time spent: 38 Minutes     Dr Lavera Guise Internal medicine

## 2017-11-21 ENCOUNTER — Other Ambulatory Visit: Payer: Self-pay | Admitting: Internal Medicine

## 2017-11-28 ENCOUNTER — Encounter: Payer: Self-pay | Admitting: Adult Health

## 2017-11-28 ENCOUNTER — Ambulatory Visit (INDEPENDENT_AMBULATORY_CARE_PROVIDER_SITE_OTHER): Payer: Medicare Other | Admitting: Adult Health

## 2017-11-28 VITALS — BP 130/84 | HR 112 | Temp 100.9°F | Resp 16 | Ht 65.0 in | Wt 241.2 lb

## 2017-11-28 DIAGNOSIS — K529 Noninfective gastroenteritis and colitis, unspecified: Secondary | ICD-10-CM | POA: Diagnosis not present

## 2017-11-28 DIAGNOSIS — R300 Dysuria: Secondary | ICD-10-CM

## 2017-11-28 DIAGNOSIS — R110 Nausea: Secondary | ICD-10-CM

## 2017-11-28 LAB — POCT URINALYSIS DIPSTICK
Bilirubin, UA: NEGATIVE
Glucose, UA: POSITIVE — AB
Nitrite, UA: NEGATIVE
Protein, UA: NEGATIVE
Spec Grav, UA: 1.02 (ref 1.010–1.025)
Urobilinogen, UA: 0.2 E.U./dL
pH, UA: 5 (ref 5.0–8.0)

## 2017-11-28 MED ORDER — ONDANSETRON HCL 4 MG PO TABS: 4.0000 mg | ORAL_TABLET | Freq: Three times a day (TID) | ORAL | 0 refills | Status: DC | PRN

## 2017-11-28 MED ORDER — DICYCLOMINE HCL 10 MG PO CAPS: 10.0000 mg | ORAL_CAPSULE | Freq: Three times a day (TID) | ORAL | 0 refills | Status: DC

## 2017-11-28 NOTE — Patient Instructions (Signed)

## 2017-11-28 NOTE — Progress Notes (Signed)
[facility] [place], [place] [address]  Internal MEDICINE  Office Visit Note  Patient Name: Brenda Santana  [id number]  [id number]  Date of Service: 11/28/2017  Chief Complaint  Patient presents with  . Abdominal Pain    been going on since sunday  . Fatigue  . 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 pt also stated she hasnt had an appetite and has a funny taste in her mouth     Abdominal Pain  This is a new problem. The current episode started in the past 7 days. The onset quality is sudden. The problem occurs constantly. The most recent episode lasted 3 days. The problem has been unchanged. The pain is located in the RLQ, LLQ and generalized abdominal region. The pain is at a severity of 9/10. The pain is moderate. The quality of the pain is cramping and burning. Associated symptoms include a fever and nausea. Pertinent negatives include no arthralgias, constipation, diarrhea, dysuria, frequency or vomiting.   Pt is here for a sick visit.     Current Medication:  Outpatient Encounter Medications as of 11/28/2017  Medication Sig  . acyclovir (ZOVIRAX) 400 MG tablet Take 1 tablet po TID for 10 days then take twice daily  . acyclovir ointment (ZOVIRAX) 5 % Apply topically every 3 (three) hours.  Marland Kitchen albuterol (PROVENTIL HFA;VENTOLIN HFA) 108 (90 Base) MCG/ACT inhaler INHALE 2 PUFFS 4 TIMES A DAY AS NEEDED  . allopurinol (ZYLOPRIM) 300 MG tablet TAKE ONE TAB AT NIGHT FOR GOUT  . chlorthalidone (HYGROTON) 25 MG tablet TAKE 1 DAILY EVERY MORNING  . colchicine 0.6 MG tablet Take 1 tablet (0.6 mg total) by mouth daily. Take 2 tablets at onset of pain, then may take 1 more 1 hour later if pain continues.  Marland Kitchen gabapentin (NEURONTIN) 800 MG tablet Take 800 mg by mouth 3 (three) times daily.  . INCRUSE ELLIPTA 62.5 MCG/INH AEPB TAKE 1 PUFF BY MOUTH EVERY DAY  . INVOKAMET 50-500 MG  TABS TAKE 1 TABLET BY MOUTH TWICE A DAY  . ipratropium-albuterol (DUONEB) 0.5-2.5 (3) MG/3ML SOLN Take 3 mLs by nebulization every 6 (six) hours as needed.  . lidocaine (XYLOCAINE) 5 % ointment APPLY A SMALL AMOUNT TO AFFECTED AREA 2-3 TIMES A DAY  . LINZESS 145 MCG CAPS capsule TAKE ONE CAPSULE BY MOUTH ONCE DAILY FOR CONSTIPATION  . liraglutide (VICTOZA) 18 MG/3ML SOPN Inject 1.8 mg into the skin every morning.  Marland Kitchen lisinopril (PRINIVIL,ZESTRIL) 10 MG tablet TAKE 1 TAB DAILY IN MORNING  . meloxicam (MOBIC) 7.5 MG tablet Take 7.5 mg by mouth 2 (two) times daily.  . metoprolol tartrate (LOPRESSOR) 50 MG tablet TAKE 1 TABLET BY MOUTH TWICE A DAY  . montelukast (SINGULAIR) 10 MG tablet TAKE 1 TABLET BY MOUTH DAILY FOR ASTHMA  . phentermine 37.5 MG capsule Take 1 capsule (37.5 mg total) by mouth every morning.  . traMADol (ULTRAM) 50 MG tablet Take 50-100 mg by mouth every 8 (eight) hours as needed.   . warfarin (COUMADIN) 7.5 MG tablet Take 7.5 mg by mouth daily.  Marland Kitchen azithromycin (ZITHROMAX) 250 MG tablet TAKE 1 TABLET BY MOUTH EVERY DAY (Patient not taking: Reported on 11/09/2017)  . HYDROcodone-acetaminophen (NORCO/VICODIN) 5-325 MG tablet Take 1 tablet by mouth every 4 (four) hours as needed. (Patient not taking: Reported on 11/09/2017)  . predniSONE (DELTASONE) 5 MG tablet TAKE 2  TABLETS BY MOUTH EVERY DAY (Patient not taking: Reported on 10/06/2017)   No facility-administered encounter medications on file as of 11/28/2017.       Medical History: Past Medical History:  Diagnosis Date  . Asthma   . Atopic dermatitis   . Constipation   . COPD (chronic obstructive pulmonary disease) ([place])   . Diabetes mellitus without complication ([employer])   . DVT (deep venous thrombosis) ([place])   . GERD (gastroesophageal reflux disease)   . Hyperlipidemia   . Hypertension   . Rheumatoid arthritis (H[facility])      Vital Signs: BP 130/84 (BP Location: Left Arm, Patient Position: Sitting, Cuff Size: Large)   Pulse  (!) 112   Temp (!) 100.9 F (38.3 C)   Resp 16   Ht 5\' 5"  (1.651 m)   Wt 241 lb 3.2 oz (109.4 kg)   SpO2 100%   BMI 40.14 kg/m    Review of Systems  Constitutional: Positive for fever. Negative for chills, fatigue and unexpected weight change.  HENT: Negative for congestion, rhinorrhea, sneezing and sore throat.   Eyes: Negative for photophobia, pain and redness.  Respiratory: Negative for cough, chest tightness and shortness of breath.   Cardiovascular: Negative for chest pain and palpitations.  Gastrointestinal: Positive for abdominal pain and nausea. Negative for constipation, diarrhea and vomiting.  Endocrine: Negative.   Genitourinary: Negative for dysuria and frequency.  Musculoskeletal: Negative for arthralgias, back pain, joint swelling and neck pain.  Skin: Negative for rash.  Allergic/Immunologic: Negative.   Neurological: Negative for tremors and numbness.  Hematological: Negative for adenopathy. Does not bruise/bleed easily.  Psychiatric/Behavioral: Negative for behavioral problems and sleep disturbance. The patient is not nervous/anxious.     Physical Exam  Constitutional: She is oriented to person, place, and time. She appears well-developed and well-nourished. No distress.  HENT:  Head: Normocephalic and atraumatic.  Mouth/Throat: Oropharynx is clear and moist. No oropharyngeal exudate.  Eyes: Pupils are equal, round, and reactive to light. EOM are normal.  Neck: Normal range of motion. Neck supple. No JVD present. No tracheal deviation present. No thyromegaly present.  Cardiovascular: Normal rate, regular rhythm and normal heart sounds. Exam reveals no gallop and no friction rub.  No murmur heard. Pulmonary/Chest: Effort normal and breath sounds normal. No respiratory distress. She has no wheezes. She has no rales. She exhibits no tenderness.  Abdominal: Soft. There is no tenderness. There is no guarding.  Musculoskeletal: Normal range of motion.   Lymphadenopathy:    She has no cervical adenopathy.  Neurological: She is alert and oriented to person, place, and time. No cranial nerve deficit.  Skin: Skin is warm and dry. She is not diaphoretic.  Psychiatric: She has a normal mood and affect. Her behavior is normal. Judgment and thought content normal.  Nursing note and vitals reviewed.  Assessment/Plan: 1. Gastroenteritis Instructed pt that if pain worsens, or temperature rises she should call office or go directly to ED.  - dicyclomine (BENTYL) 10 MG capsule; Take 1 capsule (10 mg total) by mouth 4 (four) times daily -  before meals and at bedtime.  Dispense: 15 capsule; Refill: 0  2. Nausea Start with clear liquids, progress to bland foods as tolerable.  - ondansetron (ZOFRAN) 4 MG tablet; Take 1 tablet (4 mg total) by mouth every 8 (eight) hours as needed for nausea or vomiting.  Dispense: 20 tablet; Refill: 0  3. Dysuria - POCT Urinalysis Dipstick - CULTURE, URINE COMPREHENSIVE  General Counseling: Brenda Santana verbalizes understanding of  the findings of todays visit and agrees with plan of treatment. I have discussed any further diagnostic evaluation that may be needed or ordered today. We also reviewed her medications today. she has been encouraged to call the office with any questions or concerns that should arise related to todays visit.   Orders Placed This Encounter  Procedures  . CULTURE, URINE COMPREHENSIVE  . POCT Urinalysis Dipstick    No orders of the defined types were placed in this encounter.   Time spent: 25 Minutes  This patient was seen by Orson Gear AGNP-C in Collaboration with Dr Lavera Guise as a part of collaborative care agreement

## 2017-11-28 NOTE — Progress Notes (Signed)
urin

## 2017-11-29 ENCOUNTER — Other Ambulatory Visit: Payer: Self-pay

## 2017-11-29 ENCOUNTER — Other Ambulatory Visit: Payer: Self-pay | Admitting: Internal Medicine

## 2017-11-29 ENCOUNTER — Inpatient Hospital Stay
Admission: EM | Admit: 2017-11-29 | Discharge: 2017-12-06 | DRG: 372 | Disposition: A | Discharge status: Home / Self Care | Payer: Medicare Other | Attending: Surgery | Admitting: Surgery

## 2017-11-29 ENCOUNTER — Telehealth: Payer: Self-pay

## 2017-11-29 ENCOUNTER — Ambulatory Visit
Admission: RE | Admit: 2017-11-29 | Discharge: 2017-11-29 | Disposition: A | Discharge status: Home / Self Care | Payer: Medicare Other | Source: Ambulatory Visit | Attending: Adult Health | Admitting: Adult Health

## 2017-11-29 ENCOUNTER — Ambulatory Visit (INDEPENDENT_AMBULATORY_CARE_PROVIDER_SITE_OTHER): Payer: Medicare Other | Admitting: Adult Health

## 2017-11-29 ENCOUNTER — Encounter: Payer: Self-pay | Admitting: Adult Health

## 2017-11-29 VITALS — BP 136/88 | HR 107 | Temp 98.1°F | Resp 16 | Ht 65.0 in | Wt 240.0 lb

## 2017-11-29 DIAGNOSIS — N179 Acute kidney failure, unspecified: Secondary | ICD-10-CM | POA: Diagnosis present

## 2017-11-29 DIAGNOSIS — Z86718 Personal history of other venous thrombosis and embolism: Secondary | ICD-10-CM

## 2017-11-29 DIAGNOSIS — R110 Nausea: Secondary | ICD-10-CM | POA: Diagnosis not present

## 2017-11-29 DIAGNOSIS — Z7982 Long term (current) use of aspirin: Secondary | ICD-10-CM

## 2017-11-29 DIAGNOSIS — R079 Chest pain, unspecified: Secondary | ICD-10-CM | POA: Diagnosis not present

## 2017-11-29 DIAGNOSIS — K3533 Acute appendicitis with perforation and localized peritonitis, with abscess: Secondary | ICD-10-CM | POA: Diagnosis present

## 2017-11-29 DIAGNOSIS — I10 Essential (primary) hypertension: Secondary | ICD-10-CM | POA: Diagnosis present

## 2017-11-29 DIAGNOSIS — E1140 Type 2 diabetes mellitus with diabetic neuropathy, unspecified: Secondary | ICD-10-CM | POA: Diagnosis present

## 2017-11-29 DIAGNOSIS — K5790 Diverticulosis of intestine, part unspecified, without perforation or abscess without bleeding: Secondary | ICD-10-CM

## 2017-11-29 DIAGNOSIS — J449 Chronic obstructive pulmonary disease, unspecified: Secondary | ICD-10-CM | POA: Diagnosis present

## 2017-11-29 DIAGNOSIS — K219 Gastro-esophageal reflux disease without esophagitis: Secondary | ICD-10-CM | POA: Diagnosis present

## 2017-11-29 DIAGNOSIS — Z7901 Long term (current) use of anticoagulants: Secondary | ICD-10-CM

## 2017-11-29 DIAGNOSIS — K3580 Unspecified acute appendicitis: Secondary | ICD-10-CM

## 2017-11-29 DIAGNOSIS — D259 Leiomyoma of uterus, unspecified: Secondary | ICD-10-CM

## 2017-11-29 DIAGNOSIS — Z23 Encounter for immunization: Secondary | ICD-10-CM

## 2017-11-29 DIAGNOSIS — I4891 Unspecified atrial fibrillation: Secondary | ICD-10-CM | POA: Diagnosis present

## 2017-11-29 DIAGNOSIS — Z6839 Body mass index (BMI) 39.0-39.9, adult: Secondary | ICD-10-CM | POA: Diagnosis not present

## 2017-11-29 DIAGNOSIS — Z7952 Long term (current) use of systemic steroids: Secondary | ICD-10-CM | POA: Diagnosis not present

## 2017-11-29 DIAGNOSIS — I7090 Unspecified atherosclerosis: Secondary | ICD-10-CM | POA: Insufficient documentation

## 2017-11-29 DIAGNOSIS — K3532 Acute appendicitis with perforation and localized peritonitis, without abscess: Secondary | ICD-10-CM | POA: Diagnosis present

## 2017-11-29 DIAGNOSIS — K37 Unspecified appendicitis: Secondary | ICD-10-CM | POA: Diagnosis present

## 2017-11-29 DIAGNOSIS — E669 Obesity, unspecified: Secondary | ICD-10-CM | POA: Diagnosis present

## 2017-11-29 DIAGNOSIS — Z794 Long term (current) use of insulin: Secondary | ICD-10-CM | POA: Diagnosis not present

## 2017-11-29 DIAGNOSIS — R1010 Upper abdominal pain, unspecified: Secondary | ICD-10-CM

## 2017-11-29 DIAGNOSIS — Z79899 Other long term (current) drug therapy: Secondary | ICD-10-CM

## 2017-11-29 DIAGNOSIS — E119 Type 2 diabetes mellitus without complications: Secondary | ICD-10-CM | POA: Diagnosis present

## 2017-11-29 DIAGNOSIS — Z87891 Personal history of nicotine dependence: Secondary | ICD-10-CM | POA: Diagnosis not present

## 2017-11-29 LAB — COMPREHENSIVE METABOLIC PANEL
ALT: 29 U/L (ref 0–44)
AST: 33 U/L (ref 15–41)
Albumin: 4 g/dL (ref 3.5–5.0)
Alkaline Phosphatase: 108 U/L (ref 38–126)
Anion gap: 13 (ref 5–15)
BUN: 25 mg/dL — ABNORMAL HIGH (ref 6–20)
CO2: 28 mmol/L (ref 22–32)
Calcium: 9.5 mg/dL (ref 8.9–10.3)
Chloride: 90 mmol/L — ABNORMAL LOW (ref 98–111)
Creatinine, Ser: 1.45 mg/dL — ABNORMAL HIGH (ref 0.44–1.00)
GFR calc Af Amer: 45 mL/min — ABNORMAL LOW (ref >60)
GFR calc non Af Amer: 39 mL/min — ABNORMAL LOW (ref >60)
Glucose, Bld: 160 mg/dL — ABNORMAL HIGH (ref 70–99)
Potassium: 4.2 mmol/L (ref 3.5–5.1)
Sodium: 131 mmol/L — ABNORMAL LOW (ref 135–145)
Total Bilirubin: 0.7 mg/dL (ref 0.3–1.2)
Total Protein: 8.5 g/dL — ABNORMAL HIGH (ref 6.5–8.1)

## 2017-11-29 LAB — CBC
HCT: 44.3 % (ref 35.0–47.0)
Hemoglobin: 15.1 g/dL (ref 12.0–16.0)
MCH: 33.7 pg (ref 26.0–34.0)
MCHC: 34.1 g/dL (ref 32.0–36.0)
MCV: 98.9 fL (ref 80.0–100.0)
Platelets: 423 10*3/uL (ref 150–440)
RBC: 4.48 MIL/uL (ref 3.80–5.20)
RDW: 15 % — ABNORMAL HIGH (ref 11.5–14.5)
WBC: 17.9 10*3/uL — ABNORMAL HIGH (ref 3.6–11.0)

## 2017-11-29 LAB — APTT: aPTT: 72 seconds — ABNORMAL HIGH (ref 24–36)

## 2017-11-29 LAB — PROTIME-INR
INR: 3.25
Prothrombin Time: 32.9 seconds — ABNORMAL HIGH (ref 11.4–15.2)

## 2017-11-29 LAB — POCT I-STAT CREATININE: Creatinine, Ser: 1.5 mg/dL — ABNORMAL HIGH (ref 0.44–1.00)

## 2017-11-29 LAB — LIPASE, BLOOD: Lipase: 38 U/L (ref 11–51)

## 2017-11-29 LAB — TYPE AND SCREEN
ABO/RH(D): O POS
Antibody Screen: NEGATIVE

## 2017-11-29 MED ORDER — SODIUM CHLORIDE 0.9 % IV BOLUS
500.0000 mL | Freq: Once | INTRAVENOUS | Status: AC
  Administered 2017-12-02: 500 mL via INTRAVENOUS

## 2017-11-29 MED ORDER — OXYCODONE HCL 5 MG PO TABS
5.0000 mg | ORAL_TABLET | ORAL | Status: DC | PRN
  Administered 2017-11-29 – 2017-12-02 (×10): 10 mg via ORAL
  Administered 2017-12-02: 5 mg via ORAL
  Administered 2017-12-02: 10 mg via ORAL
  Administered 2017-12-03 – 2017-12-04 (×7): 5 mg via ORAL
  Administered 2017-12-04: 10 mg via ORAL
  Administered 2017-12-05: 5 mg via ORAL
  Administered 2017-12-05 (×2): 10 mg via ORAL
  Administered 2017-12-05 – 2017-12-06 (×2): 5 mg via ORAL
  Filled 2017-11-29 (×4): qty 2
  Filled 2017-11-29: qty 1
  Filled 2017-11-29 (×3): qty 2
  Filled 2017-11-29 (×2): qty 1
  Filled 2017-11-29: qty 2
  Filled 2017-11-29 (×4): qty 1
  Filled 2017-11-29: qty 2
  Filled 2017-11-29: qty 1
  Filled 2017-11-29 (×5): qty 2
  Filled 2017-11-29: qty 1
  Filled 2017-11-29: qty 2
  Filled 2017-11-29 (×2): qty 1

## 2017-11-29 MED ORDER — PROCHLORPERAZINE EDISYLATE 10 MG/2ML IJ SOLN
5.0000 mg | Freq: Four times a day (QID) | INTRAMUSCULAR | Status: DC | PRN
  Filled 2017-11-29: qty 2

## 2017-11-29 MED ORDER — ONDANSETRON 4 MG PO TBDP
4.0000 mg | ORAL_TABLET | Freq: Four times a day (QID) | ORAL | Status: DC | PRN
  Administered 2017-11-29 – 2017-12-01 (×2): 4 mg via ORAL
  Filled 2017-11-29 (×2): qty 1

## 2017-11-29 MED ORDER — SODIUM CHLORIDE 0.9 % IV BOLUS
500.0000 mL | Freq: Once | INTRAVENOUS | Status: AC
  Administered 2017-11-29: 500 mL via INTRAVENOUS

## 2017-11-29 MED ORDER — HYDRALAZINE HCL 20 MG/ML IJ SOLN: 10.0000 mg | INTRAMUSCULAR | Status: DC | PRN

## 2017-11-29 MED ORDER — MONTELUKAST SODIUM 10 MG PO TABS
10.0000 mg | ORAL_TABLET | Freq: Every day | ORAL | Status: DC
  Administered 2017-11-29 – 2017-12-05 (×7): 10 mg via ORAL
  Filled 2017-11-29 (×7): qty 1

## 2017-11-29 MED ORDER — PIPERACILLIN-TAZOBACTAM 3.375 G IVPB 30 MIN
3.3750 g | Freq: Once | INTRAVENOUS | Status: AC
  Administered 2017-11-29: 3.375 g via INTRAVENOUS

## 2017-11-29 MED ORDER — GABAPENTIN 400 MG PO CAPS
800.0000 mg | ORAL_CAPSULE | Freq: Three times a day (TID) | ORAL | Status: DC
  Administered 2017-11-29 – 2017-12-02 (×7): 800 mg via ORAL
  Filled 2017-11-29 (×9): qty 2

## 2017-11-29 MED ORDER — CHLORTHALIDONE 25 MG PO TABS
25.0000 mg | ORAL_TABLET | Freq: Every day | ORAL | Status: DC
  Administered 2017-11-30 – 2017-12-04 (×4): 25 mg via ORAL
  Filled 2017-11-29 (×5): qty 1

## 2017-11-29 MED ORDER — FAMOTIDINE IN NACL 20-0.9 MG/50ML-% IV SOLN
20.0000 mg | Freq: Two times a day (BID) | INTRAVENOUS | Status: DC
  Administered 2017-11-29 – 2017-12-05 (×12): 20 mg via INTRAVENOUS
  Filled 2017-11-29 (×12): qty 50

## 2017-11-29 MED ORDER — ACYCLOVIR 400 MG PO TABS
400.0000 mg | ORAL_TABLET | Freq: Two times a day (BID) | ORAL | Status: DC
  Filled 2017-11-29 (×3): qty 1

## 2017-11-29 MED ORDER — PIPERACILLIN-TAZOBACTAM 3.375 G IVPB 30 MIN
3.3750 g | Freq: Once | INTRAVENOUS | Status: AC
  Administered 2017-11-30: 3.375 g via INTRAVENOUS
  Filled 2017-11-29 (×2): qty 50

## 2017-11-29 MED ORDER — IPRATROPIUM-ALBUTEROL 0.5-2.5 (3) MG/3ML IN SOLN
3.0000 mL | Freq: Four times a day (QID) | RESPIRATORY_TRACT | Status: DC | PRN
  Administered 2017-11-29: 3 mL via RESPIRATORY_TRACT

## 2017-11-29 MED ORDER — PNEUMOCOCCAL VAC POLYVALENT 25 MCG/0.5ML IJ INJ: 0.5000 mL | INJECTION | INTRAMUSCULAR | 0 refills | Status: DC

## 2017-11-29 MED ORDER — METOPROLOL TARTRATE 50 MG PO TABS
50.0000 mg | ORAL_TABLET | Freq: Two times a day (BID) | ORAL | Status: DC
  Administered 2017-11-29 – 2017-12-06 (×13): 50 mg via ORAL
  Filled 2017-11-29 (×14): qty 1

## 2017-11-29 MED ORDER — IOHEXOL 300 MG/ML  SOLN
75.0000 mL | Freq: Once | INTRAMUSCULAR | Status: AC | PRN
  Administered 2017-11-29: 75 mL via INTRAVENOUS

## 2017-11-29 MED ORDER — ALBUTEROL SULFATE (2.5 MG/3ML) 0.083% IN NEBU
2.5000 mg | INHALATION_SOLUTION | Freq: Four times a day (QID) | RESPIRATORY_TRACT | Status: DC | PRN
  Administered 2017-12-04: 2.5 mg via RESPIRATORY_TRACT
  Filled 2017-11-29: qty 3

## 2017-11-29 MED ORDER — UMECLIDINIUM BROMIDE 62.5 MCG/INH IN AEPB
1.0000 | INHALATION_SPRAY | Freq: Every day | RESPIRATORY_TRACT | Status: DC
  Administered 2017-11-30 – 2017-12-05 (×6): 1 via RESPIRATORY_TRACT
  Filled 2017-11-29 (×3): qty 7

## 2017-11-29 MED ORDER — INSULIN ASPART 100 UNIT/ML ~~LOC~~ SOLN: 0.0000 [IU] | Freq: Every day | SUBCUTANEOUS | Status: DC

## 2017-11-29 MED ORDER — ONDANSETRON HCL 4 MG/2ML IJ SOLN
4.0000 mg | Freq: Four times a day (QID) | INTRAMUSCULAR | Status: DC | PRN
  Administered 2017-12-02: 4 mg via INTRAVENOUS
  Filled 2017-11-29 (×2): qty 2

## 2017-11-29 MED ORDER — PIPERACILLIN-TAZOBACTAM 3.375 G IVPB
INTRAVENOUS | Status: AC
  Administered 2017-11-29: 3.375 g via INTRAVENOUS
  Filled 2017-11-29: qty 50

## 2017-11-29 MED ORDER — INSULIN ASPART 100 UNIT/ML ~~LOC~~ SOLN
0.0000 [IU] | Freq: Three times a day (TID) | SUBCUTANEOUS | Status: DC
  Administered 2017-11-30 (×2): 3 [IU] via SUBCUTANEOUS
  Administered 2017-12-01: 4 [IU] via SUBCUTANEOUS
  Administered 2017-12-01: 3 [IU] via SUBCUTANEOUS
  Administered 2017-12-02 – 2017-12-03 (×3): 4 [IU] via SUBCUTANEOUS
  Administered 2017-12-03: 11 [IU] via SUBCUTANEOUS
  Administered 2017-12-04: 7 [IU] via SUBCUTANEOUS
  Administered 2017-12-04: 3 [IU] via SUBCUTANEOUS
  Administered 2017-12-04 – 2017-12-05 (×3): 7 [IU] via SUBCUTANEOUS
  Filled 2017-11-29 (×14): qty 1

## 2017-11-29 MED ORDER — LACTATED RINGERS IV SOLN
INTRAVENOUS | Status: DC
  Administered 2017-11-29 – 2017-11-30 (×2): via INTRAVENOUS

## 2017-11-29 MED ORDER — PREDNISONE 20 MG PO TABS
10.0000 mg | ORAL_TABLET | Freq: Every day | ORAL | Status: DC
  Administered 2017-11-30: 10 mg via ORAL
  Filled 2017-11-29: qty 1

## 2017-11-29 MED ORDER — TRAMADOL HCL 50 MG PO TABS
100.0000 mg | ORAL_TABLET | Freq: Four times a day (QID) | ORAL | Status: DC | PRN
  Administered 2017-11-30 – 2017-12-06 (×2): 100 mg via ORAL
  Filled 2017-11-29 (×2): qty 2

## 2017-11-29 MED ORDER — PROCHLORPERAZINE MALEATE 10 MG PO TABS
10.0000 mg | ORAL_TABLET | Freq: Four times a day (QID) | ORAL | Status: DC | PRN
  Administered 2017-11-29 – 2017-11-30 (×2): 10 mg via ORAL
  Filled 2017-11-29 (×4): qty 1

## 2017-11-29 MED ORDER — IPRATROPIUM-ALBUTEROL 0.5-2.5 (3) MG/3ML IN SOLN
RESPIRATORY_TRACT | Status: AC
  Filled 2017-11-29: qty 3

## 2017-11-29 MED ORDER — MORPHINE SULFATE (PF) 4 MG/ML IV SOLN
4.0000 mg | INTRAVENOUS | Status: DC | PRN
  Administered 2017-11-30 – 2017-12-03 (×9): 4 mg via INTRAVENOUS
  Filled 2017-11-29 (×9): qty 1

## 2017-11-29 NOTE — Telephone Encounter (Signed)
lmom that when gets this message  For abnormal CT she need to go ER asap /tat

## 2017-11-29 NOTE — ED Notes (Signed)
Surgery in to eval

## 2017-11-29 NOTE — ED Notes (Signed)
Report given to [employer], pt admitted to 204.

## 2017-11-29 NOTE — ED Provider Notes (Signed)
[facility] Emergency Department Provider Note  ____________________________________________   I have reviewed the triage vital signs and the nursing notes. Where available I have reviewed prior notes and, if possible and indicated, outside hospital notes.    HISTORY  Chief Complaint Abdominal Pain (ruptured appendix)    HPI Brenda Santana is a 57 y.o. female history of upper extremity blood clot 7 years ago on Coumadin, COPD, diabetes, states she has had abdominal pain mostly in the generalized region of the lower abdomen since Sunday.  Got a lot worse over last couple days right now it is not bad at all.  However she did have a CT scan after visiting her doctor Monday and then going back today she states that showed a ruptured appendicitis.  Patient states that she is not vomited for the last 2 days.  She has had no fevers.  No other alleviating or aggravating symptoms, the pain is mostly just on the lower abdomen she cannot really specify right lower quadrant for me.  Nothing makes it better or worse.  No other associated symptoms.  Pain was sharp.  No antibiotic allergies.   Past Medical History:  Diagnosis Date  . Asthma   . Atopic dermatitis   . Constipation   . COPD (chronic obstructive pulmonary disease) ([place])   . Diabetes mellitus without complication ([place])   . DVT (deep venous thrombosis) ([place])   . GERD (gastroesophageal reflux disease)   . Hyperlipidemia   . Hypertension   . Rheumatoid arthritis [facility])     Patient Active Problem List   Diagnosis Date Noted  . Uncontrolled type 2 diabetes mellitus with hyperglycemia ([place]) 11/09/2017  . 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 Mucopurulent chronic bronchitis ([place]) 11/09/2017  . Personal history of venous thrombosis and embolism 10/06/2017  . Phlebitis and thrombophlebitis of other sites 06/09/2017  . Obstructive sleep apnea of adult 06/09/2017  . Gout, unspecified 06/09/2017  . Herpesviral  vulvovaginitis 06/09/2017  . Type 2 diabetes mellitus with diabetic neuropathy, unspecified ([place]) 06/09/2017  . Essential (primary) hypertension 06/09/2017  . Chronic anticoagulation 06/09/2017  . Pain medication agreement signed 01/05/2017  . Subacromial bursitis of left shoulder joint 10/19/2016  . Chronic shoulder bursitis, right 06/21/2016  . Chronic pain of left knee 07/14/2014  . Myofascial muscle pain 07/14/2014  . Low back pain 03/18/2014  . COPD (chronic obstructive pulmonary disease) ([place]) 05/01/2013  . Upper respiratory infection 05/01/2013  . Acne 09/12/2012  . Hand dermatitis 09/12/2012  . DVT (deep venous thrombosis) ([place]) 08/31/2012  . Constipation 08/16/2012  . Esophageal reflux disease 08/16/2012  . Asthma 04/10/2012  . Tobacco use disorder 04/02/2012  . Morbid obesity ([place]) 03/01/2012  . Dermatitis 01/11/2012  . Depressive disorder 06/22/2010  . Diabetes mellitus without complication ([place]) 93/23/5573  . Encounter for long-term (current) use of other medications 08/17/2009    Past Surgical History:  Procedure Laterality Date  . right arm surgery    . TUBAL LIGATION      Prior to Admission medications   Medication Sig Start Date End Date Taking? Authorizing Provider  acyclovir (ZOVIRAX) 400 MG tablet Take 1 tablet po TID for 10 days then take twice daily 11/09/17   Ronnell Freshwater, NP  acyclovir ointment (ZOVIRAX) 5 % Apply topically every 3 (three) hours. 11/09/17   Ronnell Freshwater, NP  albuterol (PROVENTIL HFA;VENTOLIN HFA) 108 (90 Base) MCG/ACT inhaler INHALE 2 PUFFS 4 TIMES A DAY AS NEEDED 06/14/17   Ronnell Freshwater, NP  allopurinol (ZYLOPRIM) 300 MG tablet TAKE ONE TAB AT NIGHT FOR GOUT 05/22/17   Ronnell Freshwater, NP  azithromycin (ZITHROMAX) 250 MG tablet TAKE 1 TABLET BY MOUTH EVERY DAY 10/17/17   Ronnell Freshwater, NP  chlorthalidone (HYGROTON) 25 MG tablet TAKE 1 DAILY EVERY MORNING 09/12/17   Lavera Guise, MD  colchicine 0.6 MG tablet Take 1 tablet  (0.6 mg total) by mouth daily. Take 2 tablets at onset of pain, then may take 1 more 1 hour later if pain continues. 09/20/15   Hagler, Jami L, PA-C  dicyclomine (BENTYL) 10 MG capsule Take 1 capsule (10 mg total) by mouth 4 (four) times daily -  before meals and at bedtime. 11/28/17   Kendell Bane, NP  gabapentin (NEURONTIN) 800 MG tablet Take 800 mg by mouth 3 (three) times daily.    [provider]  HYDROcodone-acetaminophen (NORCO/VICODIN) 5-325 MG tablet Take 1 tablet by mouth every 4 (four) hours as needed. 06/04/17   Harvest Dark, MD  INCRUSE ELLIPTA 62.5 MCG/INH AEPB TAKE 1 PUFF BY MOUTH EVERY DAY 07/13/17   Lavera Guise, MD  INVOKAMET 50-500 MG TABS TAKE 1 TABLET BY MOUTH TWICE A DAY 08/24/17   Lavera Guise, MD  ipratropium-albuterol (DUONEB) 0.5-2.5 (3) MG/3ML SOLN Take 3 mLs by nebulization every 6 (six) hours as needed.    [provider]  lidocaine (XYLOCAINE) 5 % ointment APPLY A SMALL AMOUNT TO AFFECTED AREA 2-3 TIMES A DAY 04/24/17   Lavera Guise, MD  LINZESS 145 MCG CAPS capsule TAKE ONE CAPSULE BY MOUTH ONCE DAILY FOR CONSTIPATION 10/23/17   Lavera Guise, MD  liraglutide (VICTOZA) 18 MG/3ML SOPN Inject 1.8 mg into the skin every morning.    [provider]  lisinopril (PRINIVIL,ZESTRIL) 10 MG tablet TAKE 1 TAB DAILY IN MORNING 07/28/17   Ronnell Freshwater, NP  meloxicam (MOBIC) 7.5 MG tablet Take 7.5 mg by mouth 2 (two) times daily.    [provider]  metoprolol tartrate (LOPRESSOR) 50 MG tablet TAKE 1 TABLET BY MOUTH TWICE A DAY 10/19/17   Boscia, Heather E, NP  montelukast (SINGULAIR) 10 MG tablet TAKE 1 TABLET BY MOUTH DAILY FOR ASTHMA 11/21/17   Ronnell Freshwater, NP  ondansetron (ZOFRAN) 4 MG tablet Take 1 tablet (4 mg total) by mouth every 8 (eight) hours as needed for nausea or vomiting. 11/28/17   Kendell Bane, NP  phentermine 37.5 MG capsule Take 1 capsule (37.5 mg total) by mouth every morning. 11/09/17   Ronnell Freshwater, NP   pneumococcal 23 valent vaccine (PNEUMOVAX 23) 25 MCG/0.5ML injection Inject 0.5 mLs into the muscle tomorrow at 10 am for 1 dose. 11/29/17 11/29/17  Lavera Guise, MD  predniSONE (DELTASONE) 5 MG tablet TAKE 2 TABLETS BY MOUTH EVERY DAY 09/14/17   Lavera Guise, MD  traMADol (ULTRAM) 50 MG tablet Take 50-100 mg by mouth every 8 (eight) hours as needed.     [provider]  warfarin (COUMADIN) 7.5 MG tablet Take 7.5 mg by mouth daily.    [provider]    Allergies Patient has no known allergies.  Family History  Family history unknown: Yes    Social History Social History   Tobacco Use  . Smoking status: Former Research scientist (life sciences)  . Smokeless tobacco: Never Used  Substance Use Topics  . Alcohol use: No  . Drug use: No    Review of Systems Constitutional: No fever/chills Eyes: No visual changes. ENT:  No sore throat. No stiff neck no neck pain Cardiovascular: Denies chest pain. Respiratory: Denies shortness of breath. Gastrointestinal:   no vomiting since Monday.  No diarrhea.  No constipation. Genitourinary: Negative for dysuria. Musculoskeletal: Negative lower extremity swelling Skin: Negative for rash. Neurological: Negative for severe headaches, focal weakness or numbness.   ____________________________________________   PHYSICAL EXAM:  VITAL SIGNS: ED Triage Vitals  Enc Vitals Group     BP 11/29/17 1706 110/77     Pulse Rate 11/29/17 1706 93     Resp 11/29/17 1705 18     Temp 11/29/17 1705 98.3 F (36.8 C)     Temp Source 11/29/17 1705 Oral     SpO2 11/29/17 1706 90 %     Weight 11/29/17 1706 240 lb (108.9 kg)     Height 11/29/17 1706 5\' 5"  (1.651 m)     Head Circumference --      Peak Flow --      Pain Score 11/29/17 1706 0     Pain Loc --      Pain Edu? --      Excl. in [place]? --     Constitutional: Alert and oriented. Well appearing and in no acute distress. Eyes: Conjunctivae are normal Head: Atraumatic HEENT: No congestion/rhinnorhea. Mucous  membranes are moist.  Oropharynx non-erythematous Neck:   Nontender with no meningismus, no masses, no stridor Cardiovascular: Normal rate, regular rhythm. Grossly normal heart sounds.  Good peripheral circulation. Respiratory: Normal respiratory effort.  No retractions. Lungs CTAB. Abdominal: Soft and there is to deep palpation right lower quadrant abdominal tenderness but it is nonsurgical no rigidity, obesity limits exam. No distention. No guarding no rebound Back:  There is no focal tenderness or step off.  there is no midline tenderness there are no lesions noted. there is no CVA tenderness Musculoskeletal: No lower extremity tenderness, no upper extremity tenderness. No joint effusions, no DVT signs strong distal pulses no edema Neurologic:  Normal speech and language. No gross focal neurologic deficits are appreciated.  Skin:  Skin is warm, dry and intact. No rash noted. Psychiatric: Mood and affect are normal. Speech and behavior are normal.  ____________________________________________   LABS (all labs ordered are listed, but only abnormal results are displayed)  Labs Reviewed  COMPREHENSIVE METABOLIC PANEL - Abnormal; Notable for the following components:      Result Value   Sodium 131 (*)    Chloride 90 (*)    Glucose, Bld 160 (*)    BUN 25 (*)    Creatinine, Ser 1.45 (*)    Total Protein 8.5 (*)    GFR calc non Af Amer 39 (*)    GFR calc Af Amer 45 (*)    All other components within normal limits  CBC - Abnormal; Notable for the following components:   WBC 17.9 (*)    RDW 15.0 (*)    All other components within normal limits  LIPASE, BLOOD  URINALYSIS, COMPLETE (UACMP) WITH MICROSCOPIC  PROTIME-INR  APTT  POC URINE PREG, ED    Pertinent labs  results that were available during my care of the patient were reviewed by me and considered in my medical decision making (see chart for details). ____________________________________________  EKG  I personally  interpreted any EKGs ordered by me or triage  ____________________________________________  RADIOLOGY  Pertinent labs & imaging results that were available during my care of the patient were reviewed by me and considered in my medical decision making (see chart for details).  If possible, patient and/or family made aware of any abnormal findings.  Ct Abdomen Pelvis W Contrast  Result Date: 11/29/2017 CLINICAL DATA:  Abdominal pain, nausea and vomiting for 3 days. EXAM: CT ABDOMEN AND PELVIS WITH CONTRAST TECHNIQUE: Multidetector CT imaging of the abdomen and pelvis was performed using the standard protocol following bolus administration of intravenous contrast. CONTRAST:  75 mL OMNIPAQUE IOHEXOL 300 MG/ML  SOLN COMPARISON:  None. FINDINGS: Lower chest: Mild dependent atelectasis is seen in the lung bases. No pleural or pericardial effusion. Heart size is normal. Hepatobiliary: No focal liver abnormality is seen. No gallstones, gallbladder wall thickening, or biliary dilatation. Single punctate calcification in the left hepatic lobe is incidentally noted. Pancreas: Unremarkable. No pancreatic ductal dilatation or surrounding inflammatory changes. Spleen: Normal in size without focal abnormality. Adrenals/Urinary Tract: A cyst in lower pole of the left kidney measures 3 cm in diameter. A smaller low attenuating lesion lower pole of the left kidney is also likely a cyst. The kidneys otherwise appear normal. Ureters and urinary bladder appear normal. The adrenal glands are normal appearance. Stomach/Bowel: The patient has extensive diverticulosis. An air and fluid collection measuring 4.9 cm AP x 4.2 cm transverse x 4.9 cm craniocaudal is seen in the right lower quadrant and consistent with a contained perforation/abscess. This appears to be secondary to a ruptured appendix. A hyperattenuating tubular structure tracking along the inferior margin of this collection is likely the appendix. The stomach and small  bowel appear normal. Vascular/Lymphatic: Tiny lymph nodes in the anterior aspect of the right lower quadrant are likely reactive. No pathologically enlarged lymph nodes are seen. Scattered atherosclerotic calcifications without aneurysm of the aorta noted. Reproductive: The patient is a fibroid uterus. Adnexa are unremarkable. Other: None. Musculoskeletal: No acute or focal abnormality. There is some degenerative change about the symphysis pubis and mild degenerative disease lower lumbar spine. IMPRESSION: Findings consistent with a contained perforation/abscess in the right lower quadrant which is likely due to ruptured appendix. Diverticulitis is possible but thought less likely. Extensive diverticulosis. Atherosclerosis. Fibroid uterus. These results were called by telephone at the time of interpretation on 11/29/2017 at 1:32 pm to [place], N.P., who verbally acknowledged these results. Electronically Signed   By: Inge Rise M.D.   On: 11/29/2017 13:35   ____________________________________________    PROCEDURES  Procedure(s) performed: None  Procedures  Critical Care performed: None  ____________________________________________   INITIAL IMPRESSION / ASSESSMENT AND PLAN / ED COURSE  Pertinent labs & imaging results that were available during my care of the patient were reviewed by me and considered in my medical decision making (see chart for details).  Patient here with ruptured appendix, I have consulted surgery, Dr. Roderic Palau however he is in the operating room, he will come see the patient in the meantime I am starting her on Zosyn, she is in no acute distress.  We will obtain PT/INR, and type and screen while waiting.    ____________________________________________   FINAL CLINICAL IMPRESSION(S) / ED DIAGNOSES  Final diagnoses:  Appendicitis with perforation      This chart was dictated using voice recognition software.  Despite best efforts to proofread,  errors  can occur which can change meaning.      Schuyler Amor, MD 11/29/17 [phone]

## 2017-11-29 NOTE — H&P (Signed)
Patient ID: Brenda Santana, female   DOB: [birth year], 57 y.o.   MRN: [id number]  HPI Brenda Santana is a 57 y.o. female consultation at the request of Dr. Burlene Arnt.  She came into the emergency room complaining of abdominal pain for about 5 days or so.  She reports that her pain is intermittent moderate intensity sharp in nature and located mainly in the lower abdomen and right lower quadrant.  She reports that there is increasing pain when she moves.  Some nausea and emesis some decreased appetite.  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 CT scan was performed and I have personally review showing evidence of a periappendiceal abscess.  There is some fat stranding and some evidence of inflammatory response around the cecum.  There is no free air. Of note she had a history of DVT, COPD diabetes and obesity. She Is able to perform 4 METS of activity without any shortness of the chest pain. White count is 17,000 normal platelets.  INR is 3.25, creatinine is 1.45, NA 131  HPI  Past Medical History:  Diagnosis Date  . Asthma   . Atopic dermatitis   . Constipation   . COPD (chronic obstructive pulmonary disease) ([place])   . Diabetes mellitus without complication ([place])   . DVT (deep venous thrombosis) ([place])   . GERD (gastroesophageal reflux disease)   . Hyperlipidemia   . Hypertension   . Rheumatoid arthritis ([place])     Past Surgical History:  Procedure Laterality Date  . right arm surgery    . TUBAL LIGATION      Family History  Family history unknown: Yes    Social History Social History   Tobacco Use  . Smoking status: Former Research scientist (life sciences)  . Smokeless tobacco: Never Used  Substance Use Topics  . Alcohol use: No  . Drug use: No    No Known Allergies  Current Facility-Administered Medications  Medication Dose Route Frequency Provider Last Rate Last Dose  . sodium chloride 0.9 % bolus 500 mL  500 mL Intravenous Once Shey Yott, Marjory Lies, MD       Current  Outpatient Medications  Medication Sig Dispense Refill  . acyclovir (ZOVIRAX) 400 MG tablet Take 1 tablet po TID for 10 days then take twice daily (Patient taking differently: Take 400 mg by mouth 2 (two) times daily. ) 70 tablet 3  . acyclovir ointment (ZOVIRAX) 5 % Apply topically every 3 (three) hours. 60 g 3  . albuterol (PROVENTIL HFA;VENTOLIN HFA) 108 (90 Base) MCG/ACT inhaler INHALE 2 PUFFS 4 TIMES A DAY AS NEEDED 3 Inhaler 2  . allopurinol (ZYLOPRIM) 300 MG tablet TAKE ONE TAB AT NIGHT FOR GOUT 90 tablet 3  . aspirin EC 81 MG tablet Take 81 mg by mouth daily.    . chlorthalidone (HYGROTON) 25 MG tablet TAKE 1 DAILY EVERY MORNING 30 tablet 4  . dicyclomine (BENTYL) 10 MG capsule Take 1 capsule (10 mg total) by mouth 4 (four) times daily -  before meals and at bedtime. 15 capsule 0  . Ferrous Sulfate (IRON) 325 (65 Fe) MG TABS Take 1 tablet by mouth daily.    Marland Kitchen gabapentin (NEURONTIN) 800 MG tablet Take 800 mg by mouth 3 (three) times daily.    . INCRUSE ELLIPTA 62.5 MCG/INH AEPB TAKE 1 PUFF BY MOUTH EVERY DAY 30 each 5  . INVOKAMET 50-500 MG TABS TAKE 1 TABLET BY MOUTH TWICE A DAY 60 tablet 3  .  ipratropium-albuterol (DUONEB) 0.5-2.5 (3) MG/3ML SOLN Take 3 mLs by nebulization every 6 (six) hours as needed.    Marland Kitchen LINZESS 145 MCG CAPS capsule TAKE ONE CAPSULE BY MOUTH ONCE DAILY FOR CONSTIPATION 30 capsule 3  . liraglutide (VICTOZA) 18 MG/3ML SOPN Inject 1.8 mg into the skin every morning.    Marland Kitchen lisinopril (PRINIVIL,ZESTRIL) 10 MG tablet TAKE 1 TAB DAILY IN MORNING 90 tablet 4  . metoprolol tartrate (LOPRESSOR) 50 MG tablet TAKE 1 TABLET BY MOUTH TWICE A DAY 180 tablet 3  . montelukast (SINGULAIR) 10 MG tablet TAKE 1 TABLET BY MOUTH DAILY FOR ASTHMA (Patient taking differently: TAKE 1 TABLET BY MOUTH TWICE DAILY FOR ASTHMA) 90 tablet 2  . phentermine 37.5 MG capsule Take 1 capsule (37.5 mg total) by mouth every morning. 30 capsule 1  . predniSONE (DELTASONE) 5 MG tablet TAKE 2 TABLETS BY MOUTH  EVERY DAY (Patient taking differently: TAKE 20MG   BY MOUTH EVERY DAY) 200 tablet 1  . warfarin (COUMADIN) 7.5 MG tablet Take 7.5 mg by mouth daily.    Marland Kitchen azithromycin (ZITHROMAX) 250 MG tablet TAKE 1 TABLET BY MOUTH EVERY DAY (Patient not taking: Reported on 11/29/2017) 21 tablet 0  . colchicine 0.6 MG tablet Take 1 tablet (0.6 mg total) by mouth daily. Take 2 tablets at onset of pain, then may take 1 more 1 hour later if pain continues. (Patient not taking: Reported on 11/29/2017) 6 tablet 0  . HYDROcodone-acetaminophen (NORCO/VICODIN) 5-325 MG tablet Take 1 tablet by mouth every 4 (four) hours as needed. (Patient not taking: Reported on 11/29/2017) 15 tablet 0  . lidocaine (XYLOCAINE) 5 % ointment APPLY A SMALL AMOUNT TO AFFECTED AREA 2-3 TIMES A DAY 35.44 g 0  . ondansetron (ZOFRAN) 4 MG tablet Take 1 tablet (4 mg total) by mouth every 8 (eight) hours as needed for nausea or vomiting. (Patient not taking: Reported on 11/29/2017) 20 tablet 0  . pneumococcal 23 valent vaccine (PNEUMOVAX 23) 25 MCG/0.5ML injection Inject 0.5 mLs into the muscle tomorrow at 10 am for 1 dose. 2.5 mL 0  . traMADol (ULTRAM) 50 MG tablet Take 50-100 mg by mouth every 8 (eight) hours as needed.        Review of Systems Full ROS  was asked and was negative except for the information on the HPI  Physical Exam Blood pressure 109/71, pulse 86, temperature 98.3 F (36.8 C), temperature source Oral, resp. rate 18, height 5\' 5"  (1.651 m), weight 240 lb (108.9 kg), SpO2 98 %. CONSTITUTIONAL: NAD. EYES: Pupils are equal, round, and reactive to light, Sclera are non-icteric. EARS, NOSE, MOUTH AND THROAT: The oropharynx is clear. The oral mucosa is pink and moist. Hearing is intact to voice. LYMPH NODES:  Lymph nodes in the neck are normal. RESPIRATORY:  Lungs are clear. There is normal respiratory effort, with equal breath sounds bilaterally, and without pathologic use of accessory muscles. CARDIOVASCULAR: Heart is regular without  murmurs, gallops, or rubs. GI: The abdomen is  soft, Mild TTP RLQ, no peritonitis or rebound. GU: Rectal deferred.   MUSCULOSKELETAL: Normal muscle strength and tone. No cyanosis or edema.   SKIN: Turgor is good and there are no pathologic skin lesions or ulcers. NEUROLOGIC: Motor and sensation is grossly normal. Cranial nerves are grossly intact. PSYCH:  Oriented to person, place and time. Affect is normal.  Data Reviewed  I have personally reviewed the patient's imaging, laboratory findings and medical records.    Assessment/Plan 57 year old female with multiple comorbidities including  COPD and history of DVT currently anticoagulated presented with a periappendiceal abscess.  Patient is not toxic or septic and there is no evidence of peritonitis.  We will admit for antibiotic management fluid resuscitation.  Will put a consult for interventional radiology to evaluate the possibility of placing a percutaneous drain.  We will hold Coumadin and recheck INR in the morning.  Will consult hospitalist to comanage multiple medical issues.  No need for surgical intervention at this time.  Had an extensive discussion with the patient and the family about her disease process.  They understand and agree with the plan  Caroleen Hamman, MD FACS General Surgeon 11/29/2017, 8:13 PM

## 2017-11-29 NOTE — Progress Notes (Signed)
[facility] [place], [place] [address]  Internal MEDICINE  Office Visit Note  Patient Name: Brenda Santana  [id number]  [id number]  Date of Service: 11/29/2017  Chief Complaint  Patient presents with  . Abdominal Pain    above belly button  . Headache  . Chest Pain    last night     Abdominal Pain  This is a new problem. The current episode started yesterday. The onset quality is sudden. The problem occurs constantly. The most recent episode lasted 3 days. The problem has been waxing and waning. The pain is located in the LUQ and RUQ. The pain is at a severity of 8/10. The pain is severe. The quality of the pain is aching and sharp. The abdominal pain does not radiate. Pertinent negatives include no arthralgias, constipation, diarrhea, dysuria or frequency. The pain is aggravated by eating.   Pt is here for a sick visit. She continues to report diffuse abdominal pain.  She denies fever, diarrhea or vomiting.  She reports nausea, that Zofran helps with minimally.  She reports an intermittent headache, as well as some chest discomfort that rapidly resolved.  She described it as aching pain, that did not radiate.  The pain was superficial over sternum, but more on the right side of her chest. After it resolved, it has not happened again.      Current Medication:  Outpatient Encounter Medications as of 11/29/2017  Medication Sig  . acyclovir (ZOVIRAX) 400 MG tablet Take 1 tablet po TID for 10 days then take twice daily  . acyclovir ointment (ZOVIRAX) 5 % Apply topically every 3 (three) hours.  Marland Kitchen albuterol (PROVENTIL HFA;VENTOLIN HFA) 108 (90 Base) MCG/ACT inhaler INHALE 2 PUFFS 4 TIMES A DAY AS NEEDED  . allopurinol (ZYLOPRIM) 300 MG tablet TAKE ONE TAB AT NIGHT FOR GOUT  . azithromycin (ZITHROMAX) 250 MG tablet TAKE 1 TABLET BY MOUTH EVERY DAY  . chlorthalidone (HYGROTON) 25 MG tablet TAKE 1 DAILY EVERY MORNING  . colchicine 0.6 MG tablet Take 1 tablet  (0.6 mg total) by mouth daily. Take 2 tablets at onset of pain, then may take 1 more 1 hour later if pain continues.  Marland Kitchen dicyclomine (BENTYL) 10 MG capsule Take 1 capsule (10 mg total) by mouth 4 (four) times daily -  before meals and at bedtime.  . gabapentin (NEURONTIN) 800 MG tablet Take 800 mg by mouth 3 (three) times daily.  Marland Kitchen HYDROcodone-acetaminophen (NORCO/VICODIN) 5-325 MG tablet Take 1 tablet by mouth every 4 (four) hours as needed.  . INCRUSE ELLIPTA 62.5 MCG/INH AEPB TAKE 1 PUFF BY MOUTH EVERY DAY  . INVOKAMET 50-500 MG TABS TAKE 1 TABLET BY MOUTH TWICE A DAY  . ipratropium-albuterol (DUONEB) 0.5-2.5 (3) MG/3ML SOLN Take 3 mLs by nebulization every 6 (six) hours as needed.  . lidocaine (XYLOCAINE) 5 % ointment APPLY A SMALL AMOUNT TO AFFECTED AREA 2-3 TIMES A DAY  . LINZESS 145 MCG CAPS capsule TAKE ONE CAPSULE BY MOUTH ONCE DAILY FOR CONSTIPATION  . liraglutide (VICTOZA) 18 MG/3ML SOPN Inject 1.8 mg into the skin every morning.  Marland Kitchen lisinopril (PRINIVIL,ZESTRIL) 10 MG tablet TAKE 1 TAB DAILY IN MORNING  . meloxicam (MOBIC) 7.5 MG tablet Take 7.5 mg by mouth 2 (two) times daily.  . metoprolol tartrate (LOPRESSOR) 50 MG tablet TAKE 1 TABLET BY MOUTH TWICE A DAY  . montelukast (SINGULAIR) 10 MG tablet TAKE 1 TABLET BY MOUTH DAILY FOR ASTHMA  . ondansetron (ZOFRAN) 4 MG  tablet Take 1 tablet (4 mg total) by mouth every 8 (eight) hours as needed for nausea or vomiting.  . phentermine 37.5 MG capsule Take 1 capsule (37.5 mg total) by mouth every morning.  . predniSONE (DELTASONE) 5 MG tablet TAKE 2 TABLETS BY MOUTH EVERY DAY  . traMADol (ULTRAM) 50 MG tablet Take 50-100 mg by mouth every 8 (eight) hours as needed.   . warfarin (COUMADIN) 7.5 MG tablet Take 7.5 mg by mouth daily.   No facility-administered encounter medications on file as of 11/29/2017.       Medical History: Past Medical History:  Diagnosis Date  . Asthma   . Atopic dermatitis   . Constipation   . COPD (chronic  obstructive pulmonary disease) ([place])   . Diabetes mellitus without complication ([place])   . DVT (deep venous thrombosis) ([place])   . GERD (gastroesophageal reflux disease)   . Hyperlipidemia   . Hypertension   . Rheumatoid arthritis ([facility])      Vital Signs: BP 136/88   Pulse (!) 107   Temp 98.1 F (36.7 C)   Resp 16   Ht 5\' 5"  (1.651 m)   Wt 240 lb (108.9 kg)   SpO2 91%   BMI 39.94 kg/m    Review of Systems  Constitutional: Negative for chills, fatigue and unexpected weight change.  HENT: Negative for congestion and sneezing.   Respiratory: Negative for chest tightness.   Gastrointestinal: Negative for constipation and diarrhea.  Endocrine: Negative.   Genitourinary: Negative for dysuria and frequency.  Musculoskeletal: Negative for arthralgias and joint swelling.  Skin: Negative for rash.  Allergic/Immunologic: Negative.   Neurological: Negative for tremors.  Hematological: Negative for adenopathy. Does not bruise/bleed easily.  Psychiatric/Behavioral: Negative for behavioral problems and sleep disturbance. The patient is not nervous/anxious.     Physical Exam  Constitutional: She is oriented to person, place, and time. She appears well-developed and well-nourished. No distress.  HENT:  Head: Normocephalic and atraumatic.  Mouth/Throat: Oropharynx is clear and moist. No oropharyngeal exudate.  Eyes: Pupils are equal, round, and reactive to light. EOM are normal.  Neck: Normal range of motion. Neck supple. No JVD present. No tracheal deviation present. No thyromegaly present.  Cardiovascular: Normal rate, regular rhythm and normal heart sounds. Exam reveals no gallop and no friction rub.  No murmur heard. Pulmonary/Chest: Effort normal and breath sounds normal. No respiratory distress. She has no wheezes. She has no rales. She exhibits no tenderness.  Abdominal: Soft. She exhibits distension and pulsatile midline mass. She exhibits no pulsatile liver. Bowel sounds are  decreased. There is hepatomegaly. There is no splenomegaly. There is tenderness in the right upper quadrant and left upper quadrant. There is no guarding.  Musculoskeletal: Normal range of motion.  Lymphadenopathy:    She has no cervical adenopathy.  Neurological: She is alert and oriented to person, place, and time. No cranial nerve deficit.  Skin: Skin is warm and dry. She is not diaphoretic.  Psychiatric: She has a normal mood and affect. Her behavior is normal. Judgment and thought content normal.  Nursing note and vitals reviewed.  Assessment/Plan: 1. Pain of upper abdomen Pt continues to report pain.  Unable to take in PO, refractory to Antiemetics.  Stat CT abdomen.   - CT Abdomen Pelvis W Contrast; Future  2. Nausea Continue to use Zofran as prescribed. Remain NPO until CT read.   3. Chest pain at rest One temporary episode of self described chest pain. Consider Echo, and Cardiology referral.  -  EKG 12-Lead    General Counseling: yekaterina escutia understanding of the findings of todays visit and agrees with plan of treatment. I have discussed any further diagnostic evaluation that may be needed or ordered today. We also reviewed her medications today. she has been encouraged to call the office with any questions or concerns that should arise related to todays visit.   No orders of the defined types were placed in this encounter.   No orders of the defined types were placed in this encounter.   Time spent: 25 Minutes  This patient was seen by Orson Gear AGNP-C in Collaboration with Dr Lavera Guise as a part of collaborative care agreement

## 2017-11-29 NOTE — ED Triage Notes (Signed)
Pt arrives to ED from outpatient CT for ruptured appendix. Alert, oriented, ambulatory. States abd pain started Sunday. N&V. Denies diarrhea. States low grade fever at PCP Monday.

## 2017-11-30 LAB — COMPREHENSIVE METABOLIC PANEL
ALT: 25 U/L (ref 0–44)
AST: 22 U/L (ref 15–41)
Albumin: 3.4 g/dL — ABNORMAL LOW (ref 3.5–5.0)
Alkaline Phosphatase: 91 U/L (ref 38–126)
Anion gap: 11 (ref 5–15)
BUN: 24 mg/dL — ABNORMAL HIGH (ref 6–20)
CO2: 29 mmol/L (ref 22–32)
Calcium: 9 mg/dL (ref 8.9–10.3)
Chloride: 95 mmol/L — ABNORMAL LOW (ref 98–111)
Creatinine, Ser: 1.25 mg/dL — ABNORMAL HIGH (ref 0.44–1.00)
GFR calc Af Amer: 54 mL/min — ABNORMAL LOW (ref >60)
GFR calc non Af Amer: 47 mL/min — ABNORMAL LOW (ref >60)
Glucose, Bld: 89 mg/dL (ref 70–99)
Potassium: 3.3 mmol/L — ABNORMAL LOW (ref 3.5–5.1)
Sodium: 135 mmol/L (ref 135–145)
Total Bilirubin: 0.6 mg/dL (ref 0.3–1.2)
Total Protein: 7.2 g/dL (ref 6.5–8.1)

## 2017-11-30 LAB — GLUCOSE, CAPILLARY
Glucose-Capillary: 95 mg/dL (ref 70–99)
Glucose-Capillary: 134 mg/dL — ABNORMAL HIGH (ref 70–99)
Glucose-Capillary: 161 mg/dL — ABNORMAL HIGH (ref 70–99)
Glucose-Capillary: 138 mg/dL — ABNORMAL HIGH (ref 70–99)

## 2017-11-30 LAB — CBC
HCT: 40.2 % (ref 35.0–47.0)
Hemoglobin: 13.5 g/dL (ref 12.0–16.0)
MCH: 33.5 pg (ref 26.0–34.0)
MCHC: 33.6 g/dL (ref 32.0–36.0)
MCV: 99.6 fL (ref 80.0–100.0)
Platelets: 365 10*3/uL (ref 150–440)
RBC: 4.03 MIL/uL (ref 3.80–5.20)
RDW: 14.5 % (ref 11.5–14.5)
WBC: 14 10*3/uL — ABNORMAL HIGH (ref 3.6–11.0)

## 2017-11-30 LAB — MAGNESIUM: Magnesium: 2.4 mg/dL (ref 1.7–2.4)

## 2017-11-30 LAB — APTT: aPTT: 69 seconds — ABNORMAL HIGH (ref 24–36)

## 2017-11-30 LAB — PROTIME-INR
INR: 3.04
Prothrombin Time: 31.2 seconds — ABNORMAL HIGH (ref 11.4–15.2)

## 2017-11-30 MED ORDER — PIPERACILLIN-TAZOBACTAM 3.375 G IVPB
3.3750 g | Freq: Three times a day (TID) | INTRAVENOUS | Status: DC
  Administered 2017-11-30 – 2017-12-06 (×18): 3.375 g via INTRAVENOUS
  Filled 2017-11-30 (×31): qty 50

## 2017-11-30 MED ORDER — HYDROCORTISONE NA SUCCINATE PF 100 MG IJ SOLR
100.0000 mg | Freq: Three times a day (TID) | INTRAMUSCULAR | Status: DC
  Administered 2017-11-30 – 2017-12-01 (×4): 100 mg via INTRAVENOUS
  Filled 2017-11-30 (×5): qty 2

## 2017-11-30 MED ORDER — SODIUM CHLORIDE 0.9 % IV SOLN: INTRAVENOUS | Status: DC

## 2017-11-30 MED ORDER — DEXTROSE 5 % IV SOLN
1.0000 mg | Freq: Once | INTRAVENOUS | Status: AC
  Administered 2017-11-30: 1 mg via INTRAVENOUS
  Filled 2017-11-30: qty 0.1

## 2017-11-30 MED ORDER — ACYCLOVIR 200 MG PO CAPS
400.0000 mg | ORAL_CAPSULE | Freq: Two times a day (BID) | ORAL | Status: DC
  Administered 2017-11-30 – 2017-12-06 (×13): 400 mg via ORAL
  Filled 2017-11-30 (×14): qty 2

## 2017-11-30 MED ORDER — FLUTICASONE PROPIONATE 50 MCG/ACT NA SUSP
1.0000 | Freq: Every day | NASAL | Status: DC
  Administered 2017-11-30 – 2017-12-05 (×3): 1 via NASAL
  Filled 2017-11-30: qty 16

## 2017-11-30 MED ORDER — POTASSIUM CHLORIDE 10 MEQ/100ML IV SOLN
10.0000 meq | INTRAVENOUS | Status: AC
  Administered 2017-11-30 (×3): 10 meq via INTRAVENOUS
  Filled 2017-11-30 (×3): qty 100

## 2017-11-30 MED ORDER — POTASSIUM CHLORIDE IN NACL 20-0.9 MEQ/L-% IV SOLN
INTRAVENOUS | Status: DC
  Administered 2017-11-30: 13:00:00 via INTRAVENOUS
  Filled 2017-11-30 (×5): qty 1000

## 2017-11-30 NOTE — Progress Notes (Signed)
Family Meeting Note  Advance Directive:yes  Today a meeting took place with the Patient.  Patient is able to participate  The following clinical team members were present during this meeting:MD  The following were discussed:Patient's diagnosis: Ruptured appendix with abscess formation, Patient's progosis: Unable to determine and Goals for treatment: Full Code  Additional follow-up to be provided: prn  Time spent during discussion:20 minutes  Gorden Harms, MD

## 2017-11-30 NOTE — Plan of Care (Signed)
Patient with ruptured appendix. Currently on IV abx. Plans for percutaneous drain once PT/INR improve. Vit K infusion given. Potassium replaced. Patient with frequent c/o pain, IV morphine, oral oxycodone and tramadol all given with some relief. Tolerating clear liquid diet, no nausea.

## 2017-11-30 NOTE — H&P (Signed)
[facility] [place] NAME: Brenda Santana    MR#:  [id number]  DATE OF BIRTH:  1960/06/20  DATE OF ADMISSION:  11/29/2017  PRIMARY CARE PHYSICIAN: Lavera Guise, MD   REQUESTING/REFERRING PHYSICIAN: COPD/asthma  CHIEF COMPLAINT:   Chief Complaint  Patient presents with  . Abdominal Pain    ruptured appendix    HISTORY OF PRESENT ILLNESS: Brenda Santana  is a 57 y.o. female with a known history per below currently in the hospital for ruptured appendix with abscess formation, currently being managed by general surgery service, hospitalist service asked to evaluate patient for asthma/COPD/medical comanagement, patient evaluated at the bedside, only complaining of abdominal pain, plans are for IR drainage later today, patient denies any chest pain/shortness of breath.  PAST MEDICAL HISTORY:   Past Medical History:  Diagnosis Date  . Asthma   . Atopic dermatitis   . Constipation   . COPD (chronic obstructive pulmonary disease) ([place])   . Diabetes mellitus without complication ([place])   . DVT (deep venous thrombosis) ([place])   . GERD (gastroesophageal reflux disease)   . Hyperlipidemia   . Hypertension   . Rheumatoid arthritis ([place])     PAST SURGICAL HISTORY:  Past Surgical History:  Procedure Laterality Date  . right arm surgery    . TUBAL LIGATION      SOCIAL HISTORY:  Social History   Tobacco Use  . Smoking status: Former Research scientist (life sciences)  . Smokeless tobacco: Never Used  Substance Use Topics  . Alcohol use: No    FAMILY HISTORY:  Family History  Family history unknown: Yes    DRUG ALLERGIES: No Known Allergies  REVIEW OF SYSTEMS:   CONSTITUTIONAL: No fever, fatigue or weakness.  EYES: No blurred or double vision.  EARS, NOSE, AND THROAT: No tinnitus or ear pain.  RESPIRATORY: No cough, shortness of breath, wheezing or hemoptysis.  CARDIOVASCULAR: No chest pain, orthopnea, edema.  GASTROINTESTINAL: No nausea, vomiting, diarrhea,+  abdominal pain.  GENITOURINARY: No dysuria, hematuria.  ENDOCRINE: No polyuria, nocturia,  HEMATOLOGY: No anemia, easy bruising or bleeding SKIN: No rash or lesion. MUSCULOSKELETAL: No joint pain or arthritis.   NEUROLOGIC: No tingling, numbness, weakness.  PSYCHIATRY: No anxiety or depression.   MEDICATIONS AT HOME:  Prior to Admission medications   Medication Sig Start Date End Date Taking? Authorizing Provider  acyclovir (ZOVIRAX) 400 MG tablet Take 1 tablet po TID for 10 days then take twice daily Patient taking differently: Take 400 mg by mouth 2 (two) times daily.  11/09/17  Yes Boscia, Greer Ee, NP  acyclovir ointment (ZOVIRAX) 5 % Apply topically every 3 (three) hours. 11/09/17  Yes Boscia, Greer Ee, NP  albuterol (PROVENTIL HFA;VENTOLIN HFA) 108 (90 Base) MCG/ACT inhaler INHALE 2 PUFFS 4 TIMES A DAY AS NEEDED 06/14/17  Yes Boscia, Heather E, NP  allopurinol (ZYLOPRIM) 300 MG tablet TAKE ONE TAB AT NIGHT FOR GOUT 05/22/17  Yes Ronnell Freshwater, NP  aspirin EC 81 MG tablet Take 81 mg by mouth daily.   Yes [provider]  chlorthalidone (HYGROTON) 25 MG tablet TAKE 1 DAILY EVERY MORNING 09/12/17  Yes Lavera Guise, MD  dicyclomine (BENTYL) 10 MG capsule Take 1 capsule (10 mg total) by mouth 4 (four) times daily -  before meals and at bedtime. 11/28/17  Yes Scarboro, Audie Clear, NP  Ferrous Sulfate (IRON) 325 (65 Fe) MG TABS Take 1 tablet by mouth daily.   Yes [provider]  gabapentin (  NEURONTIN) 800 MG tablet Take 800 mg by mouth 3 (three) times daily.   Yes [provider]  INCRUSE ELLIPTA 62.5 MCG/INH AEPB TAKE 1 PUFF BY MOUTH EVERY DAY 07/13/17  Yes Lavera Guise, MD  INVOKAMET 50-500 MG TABS TAKE 1 TABLET BY MOUTH TWICE A DAY 08/24/17  Yes Lavera Guise, MD  ipratropium-albuterol (DUONEB) 0.5-2.5 (3) MG/3ML SOLN Take 3 mLs by nebulization every 6 (six) hours as needed.   Yes [provider]  LINZESS 145 MCG CAPS capsule TAKE ONE CAPSULE BY MOUTH ONCE  DAILY FOR CONSTIPATION 10/23/17  Yes Lavera Guise, MD  liraglutide (VICTOZA) 18 MG/3ML SOPN Inject 1.8 mg into the skin every morning.   Yes [provider]  lisinopril (PRINIVIL,ZESTRIL) 10 MG tablet TAKE 1 TAB DAILY IN MORNING 07/28/17  Yes Boscia, Heather E, NP  metoprolol tartrate (LOPRESSOR) 50 MG tablet TAKE 1 TABLET BY MOUTH TWICE A DAY 10/19/17  Yes Boscia, Heather E, NP  montelukast (SINGULAIR) 10 MG tablet TAKE 1 TABLET BY MOUTH DAILY FOR ASTHMA Patient taking differently: TAKE 1 TABLET BY MOUTH TWICE DAILY FOR ASTHMA 11/21/17  Yes Ronnell Freshwater, NP  phentermine 37.5 MG capsule Take 1 capsule (37.5 mg total) by mouth every morning. 11/09/17  Yes Boscia, Heather E, NP  predniSONE (DELTASONE) 5 MG tablet TAKE 2 TABLETS BY MOUTH EVERY DAY Patient taking differently: TAKE 20MG   BY MOUTH EVERY DAY 09/14/17  Yes Lavera Guise, MD  warfarin (COUMADIN) 7.5 MG tablet Take 7.5 mg by mouth daily.   Yes [provider]  azithromycin (ZITHROMAX) 250 MG tablet TAKE 1 TABLET BY MOUTH EVERY DAY Patient not taking: Reported on 11/29/2017 10/17/17   Ronnell Freshwater, NP  colchicine 0.6 MG tablet Take 1 tablet (0.6 mg total) by mouth daily. Take 2 tablets at onset of pain, then may take 1 more 1 hour later if pain continues. Patient not taking: Reported on 11/29/2017 09/20/15   Hagler, Jami L, PA-C  HYDROcodone-acetaminophen (NORCO/VICODIN) 5-325 MG tablet Take 1 tablet by mouth every 4 (four) hours as needed. Patient not taking: Reported on 11/29/2017 06/04/17   Harvest Dark, MD  lidocaine (XYLOCAINE) 5 % ointment APPLY A SMALL AMOUNT TO AFFECTED AREA 2-3 TIMES A DAY 04/24/17   Lavera Guise, MD  ondansetron (ZOFRAN) 4 MG tablet Take 1 tablet (4 mg total) by mouth every 8 (eight) hours as needed for nausea or vomiting. Patient not taking: Reported on 11/29/2017 11/28/17   Kendell Bane, NP  traMADol (ULTRAM) 50 MG tablet Take 50-100 mg by mouth every 8 (eight) hours as needed.      [provider]      PHYSICAL EXAMINATION:   VITAL SIGNS: Blood pressure 99/62, pulse 91, temperature 98.3 F (36.8 C), temperature source Oral, resp. rate 20, height 5\' 5"  (1.651 m), weight 108.9 kg (240 lb), SpO2 98 %.  GENERAL:  57 y.o.-year-old patient lying in the bed with no acute distress.  Obese EYES: Pupils equal, round, reactive to light and accommodation. No scleral icterus. Extraocular muscles intact.  HEENT: Head atraumatic, normocephalic. Oropharynx and nasopharynx clear.  NECK:  Supple, no jugular venous distention. No thyroid enlargement, no tenderness.  LUNGS: Normal breath sounds bilaterally, no wheezing, rales,rhonchi or crepitation. No use of accessory muscles of respiration.  CARDIOVASCULAR: S1, S2 normal. No murmurs, rubs, or gallops.  ABDOMEN: Soft, +tender, nondistended. Bowel sounds present. No organomegaly or mass.  EXTREMITIES: No pedal edema, cyanosis, or clubbing.  NEUROLOGIC: Cranial  nerves II through XII are intact. Muscle strength 5/5 in all extremities. Sensation intact. Gait not checked.  PSYCHIATRIC: The patient is alert and oriented x 3.  SKIN: No obvious rash, lesion, or ulcer.   LABORATORY PANEL:   CBC Recent Labs  Lab 11/29/17 1711 11/30/17 0427  WBC 17.9* 14.0*  HGB 15.1 13.5  HCT 44.3 40.2  PLT 423 365  MCV 98.9 99.6  MCH 33.7 33.5  MCHC 34.1 33.6  RDW 15.0* 14.5   ------------------------------------------------------------------------------------------------------------------  Chemistries  Recent Labs  Lab 11/29/17 1305 11/29/17 1711 11/30/17 0427  NA  --  131* 135  K  --  4.2 3.3*  CL  --  90* 95*  CO2  --  28 29  GLUCOSE  --  160* 89  BUN  --  25* 24*  CREATININE 1.50* 1.45* 1.25*  CALCIUM  --  9.5 9.0  MG  --   --  2.4  AST  --  33 22  ALT  --  29 25  ALKPHOS  --  108 91  BILITOT  --  0.7 0.6    ------------------------------------------------------------------------------------------------------------------ estimated creatinine clearance is 61 mL/min (A) (by C-G formula based on SCr of 1.25 mg/dL (H)). ------------------------------------------------------------------------------------------------------------------ No results for input(s): TSH, T4TOTAL, T3FREE, THYROIDAB in the last 72 hours.  Invalid input(s): FREET3   Coagulation profile Recent Labs  Lab 11/29/17 1749 11/30/17 0427  INR 3.25 3.04   ------------------------------------------------------------------------------------------------------------------- No results for input(s): DDIMER in the last 72 hours. -------------------------------------------------------------------------------------------------------------------  Cardiac Enzymes No results for input(s): CKMB, TROPONINI, MYOGLOBIN in the last 168 hours.  Invalid input(s): CK ------------------------------------------------------------------------------------------------------------------ Invalid input(s): POCBNP  ---------------------------------------------------------------------------------------------------------------  Urinalysis    Component Value Date/Time   COLORURINE Yellow 10/04/2011 1425   APPEARANCEUR Clear 10/04/2011 1425   LABSPEC 1.018 10/04/2011 1425   PHURINE 5.0 10/04/2011 1425   GLUCOSEU Negative 10/04/2011 1425   HGBUR 1+ 10/04/2011 1425   BILIRUBINUR negative 11/28/2017 1128   BILIRUBINUR Negative 10/04/2011 1425   KETONESUR Negative 10/04/2011 1425   PROTEINUR Negative 11/28/2017 1128   PROTEINUR Negative 10/04/2011 1425   UROBILINOGEN 0.2 11/28/2017 1128   NITRITE negative 11/28/2017 1128   NITRITE Negative 10/04/2011 1425   LEUKOCYTESUR Small (1+) (A) 11/28/2017 1128   LEUKOCYTESUR Negative 10/04/2011 1425     RADIOLOGY: Ct Abdomen Pelvis W Contrast  Result Date: 11/29/2017 CLINICAL DATA:  Abdominal pain,  nausea and vomiting for 3 days. EXAM: CT ABDOMEN AND PELVIS WITH CONTRAST TECHNIQUE: Multidetector CT imaging of the abdomen and pelvis was performed using the standard protocol following bolus administration of intravenous contrast. CONTRAST:  75 mL OMNIPAQUE IOHEXOL 300 MG/ML  SOLN COMPARISON:  None. FINDINGS: Lower chest: Mild dependent atelectasis is seen in the lung bases. No pleural or pericardial effusion. Heart size is normal. Hepatobiliary: No focal liver abnormality is seen. No gallstones, gallbladder wall thickening, or biliary dilatation. Single punctate calcification in the left hepatic lobe is incidentally noted. Pancreas: Unremarkable. No pancreatic ductal dilatation or surrounding inflammatory changes. Spleen: Normal in size without focal abnormality. Adrenals/Urinary Tract: A cyst in lower pole of the left kidney measures 3 cm in diameter. A smaller low attenuating lesion lower pole of the left kidney is also likely a cyst. The kidneys otherwise appear normal. Ureters and urinary bladder appear normal. The adrenal glands are normal appearance. Stomach/Bowel: The patient has extensive diverticulosis. An air and fluid collection measuring 4.9 cm AP x 4.2 cm transverse x 4.9 cm craniocaudal is seen in the right lower quadrant and  consistent with a contained perforation/abscess. This appears to be secondary to a ruptured appendix. A hyperattenuating tubular structure tracking along the inferior margin of this collection is likely the appendix. The stomach and small bowel appear normal. Vascular/Lymphatic: Tiny lymph nodes in the anterior aspect of the right lower quadrant are likely reactive. No pathologically enlarged lymph nodes are seen. Scattered atherosclerotic calcifications without aneurysm of the aorta noted. Reproductive: The patient is a fibroid uterus. Adnexa are unremarkable. Other: None. Musculoskeletal: No acute or focal abnormality. There is some degenerative change about the symphysis  pubis and mild degenerative disease lower lumbar spine. IMPRESSION: Findings consistent with a contained perforation/abscess in the right lower quadrant which is likely due to ruptured appendix. Diverticulitis is possible but thought less likely. Extensive diverticulosis. Atherosclerosis. Fibroid uterus. These results were called by telephone at the time of interpretation on 11/29/2017 at 1:32 pm to [place], N.P., who verbally acknowledged these results. Electronically Signed   By: Inge Rise M.D.   On: 11/29/2017 13:35    EKG: Orders placed or performed in visit on 11/29/17  . EKG 12-Lead    IMPRESSION AND PLAN: *Acute ruptured appendix with abscess formation For IR drainage later today per primary service, on Zosyn  *Asthma without exacerbation Compounded by COPD Stable Continue home regiment, noted daily chronic prednisone use  *Acute possible adrenal insufficiency Patient on chronic prednisone use Hold prednisone, start hydrocortisone 100 mg IV 3 times daily for now  *Chronic diabetes mellitus type 2 Sliding scale insulin with Accu-Cheks per routine  *Chronic benign essential hypertension Stable Continue Lopressor and PRN hydralazine  *History of DVT On Coumadin INR currently 3  *Chronic GERD without esophagitis PPI daily   All the records are reviewed and case discussed with ED provider. Management plans discussed with the patient, family and they are in agreement.  CODE STATUS:full    Code Status Orders  (From admission, onward)        Start     Ordered   11/29/17 2137  Full code  Continuous     11/29/17 2136    Code Status History    This patient has a current code status but no historical code status.       TOTAL TIME TAKING CARE OF THIS PATIENT: 45 minutes.    Avel Peace Salary M.D on 11/30/2017   Between 7am to 6pm - Pager - [phone]  After 6pm go to [email or web address] - password [email or web address]  Office   [phone]  CC: Primary care physician; Lavera Guise, MD   Note: This dictation was prepared with Dragon dictation along with smaller phrase technology. Any transcriptional errors that result from this process are unintentional.

## 2017-11-30 NOTE — Progress Notes (Signed)
CC: Appendicitis Subjective: Feeling better.  Tolerating clear liquid diet.  No nausea no vomiting.  Vital signs were stable.  Can coming down  Objective: Vital signs in last 24 hours: Temp:  [97.8 F (36.6 C)-98.3 F (36.8 C)] 97.8 F (36.6 C) (08/01 1235) Pulse Rate:  [75-93] 92 (08/01 1235) Resp:  [16-20] 16 (08/01 1235) BP: (99-125)/(62-77) 125/71 (08/01 1235) SpO2:  [90 %-99 %] 96 % (08/01 1235) Weight:  [240 lb (108.9 kg)] 240 lb (108.9 kg) (07/31 1706) Last BM Date: 11/28/17  Intake/Output from previous day: 07/31 0701 - 08/01 0700 In: 1000 [IV Piggyback:1000] Out: 0  Intake/Output this shift: Total I/O In: 1452.1 [I.V.:1402.1; IV Piggyback:50] Out: 500 [Urine:500]  Physical exam:  NAD, alert Abd: soft, mild diffuse TTP , no peritonitis Ext: no edema and well perfused Neuro: GCS 15 , no motor or sens deficits, CN intact Lab Results: CBC  Recent Labs    11/29/17 1711 11/30/17 0427  WBC 17.9* 14.0*  HGB 15.1 13.5  HCT 44.3 40.2  PLT 423 365   BMET Recent Labs    11/29/17 1711 11/30/17 0427  NA 131* 135  K 4.2 3.3*  CL 90* 95*  CO2 28 29  GLUCOSE 160* 89  BUN 25* 24*  CREATININE 1.45* 1.25*  CALCIUM 9.5 9.0   PT/INR Recent Labs    11/29/17 1749 11/30/17 0427  LABPROT 32.9* 31.2*  INR 3.25 3.04   ABG No results for input(s): PHART, HCO3 in the last 72 hours.  Invalid input(s): PCO2, PO2  Studies/Results: Ct Abdomen Pelvis W Contrast  Result Date: 11/29/2017 CLINICAL DATA:  Abdominal pain, nausea and vomiting for 3 days. EXAM: CT ABDOMEN AND PELVIS WITH CONTRAST TECHNIQUE: Multidetector CT imaging of the abdomen and pelvis was performed using the standard protocol following bolus administration of intravenous contrast. CONTRAST:  75 mL OMNIPAQUE IOHEXOL 300 MG/ML  SOLN COMPARISON:  None. FINDINGS: Lower chest: Mild dependent atelectasis is seen in the lung bases. No pleural or pericardial effusion. Heart size is normal. Hepatobiliary: No  focal liver abnormality is seen. No gallstones, gallbladder wall thickening, or biliary dilatation. Single punctate calcification in the left hepatic lobe is incidentally noted. Pancreas: Unremarkable. No pancreatic ductal dilatation or surrounding inflammatory changes. Spleen: Normal in size without focal abnormality. Adrenals/Urinary Tract: A cyst in lower pole of the left kidney measures 3 cm in diameter. A smaller low attenuating lesion lower pole of the left kidney is also likely a cyst. The kidneys otherwise appear normal. Ureters and urinary bladder appear normal. The adrenal glands are normal appearance. Stomach/Bowel: The patient has extensive diverticulosis. An air and fluid collection measuring 4.9 cm AP x 4.2 cm transverse x 4.9 cm craniocaudal is seen in the right lower quadrant and consistent with a contained perforation/abscess. This appears to be secondary to a ruptured appendix. A hyperattenuating tubular structure tracking along the inferior margin of this collection is likely the appendix. The stomach and small bowel appear normal. Vascular/Lymphatic: Tiny lymph nodes in the anterior aspect of the right lower quadrant are likely reactive. No pathologically enlarged lymph nodes are seen. Scattered atherosclerotic calcifications without aneurysm of the aorta noted. Reproductive: The patient is a fibroid uterus. Adnexa are unremarkable. Other: None. Musculoskeletal: No acute or focal abnormality. There is some degenerative change about the symphysis pubis and mild degenerative disease lower lumbar spine. IMPRESSION: Findings consistent with a contained perforation/abscess in the right lower quadrant which is likely due to ruptured appendix. Diverticulitis is possible but thought less likely.  Extensive diverticulosis. Atherosclerosis. Fibroid uterus. These results were called by telephone at the time of interpretation on 11/29/2017 at 1:32 pm to [place], N.P., who verbally acknowledged these  results. Electronically Signed   By: Inge Rise M.D.   On: 11/29/2017 13:35    Anti-infectives: Anti-infectives (From admission, onward)   Start     Dose/Rate Route Frequency Ordered Stop   11/30/17 1400  piperacillin-tazobactam (ZOSYN) IVPB 3.375 g     3.375 g 12.5 mL/hr over 240 Minutes Intravenous Every 8 hours 11/30/17 1010     11/30/17 1000  acyclovir (ZOVIRAX) 200 MG capsule 400 mg     400 mg Oral 2 times daily 11/30/17 0959     11/30/17 0200  piperacillin-tazobactam (ZOSYN) IVPB 3.375 g     3.375 g 12.5 mL/hr over 240 Minutes Intravenous  Once 11/29/17 2136 11/30/17 0501   11/29/17 2200  acyclovir (ZOVIRAX) tablet 400 mg  Status:  Discontinued    Note to Pharmacy:  Take 1 tablet po TID for 10 days then take twice daily     400 mg Oral 2 times daily 11/29/17 2136 11/30/17 0959   11/29/17 1745  piperacillin-tazobactam (ZOSYN) IVPB 3.375 g     3.375 g 100 mL/hr over 30 Minutes Intravenous  Once 11/29/17 1732 11/29/17 1838      Assessment/Plan:  Rupture appendicitis with an abscess to coagulated with an INR of 3.  Will consult interventional radiology possible drain placement.  I will start on 1 dose of vitamin K.  No need for surgical intervention at this time.  We will keep her on clears and continue antibiotic therapy.  Caroleen Hamman, MD, [facility]  11/30/2017

## 2017-12-01 ENCOUNTER — Other Ambulatory Visit: Payer: Self-pay | Admitting: Internal Medicine

## 2017-12-01 ENCOUNTER — Inpatient Hospital Stay: Payer: Medicare Other

## 2017-12-01 LAB — CBC
HCT: 38.2 % (ref 35.0–47.0)
Hemoglobin: 13.4 g/dL (ref 12.0–16.0)
MCH: 34.8 pg — ABNORMAL HIGH (ref 26.0–34.0)
MCHC: 35 g/dL (ref 32.0–36.0)
MCV: 99.4 fL (ref 80.0–100.0)
Platelets: 336 10*3/uL (ref 150–440)
RBC: 3.84 MIL/uL (ref 3.80–5.20)
RDW: 14.7 % — ABNORMAL HIGH (ref 11.5–14.5)
WBC: 16 10*3/uL — ABNORMAL HIGH (ref 3.6–11.0)

## 2017-12-01 LAB — GLUCOSE, CAPILLARY
Glucose-Capillary: 124 mg/dL — ABNORMAL HIGH (ref 70–99)
Glucose-Capillary: 105 mg/dL — ABNORMAL HIGH (ref 70–99)
Glucose-Capillary: 169 mg/dL — ABNORMAL HIGH (ref 70–99)
Glucose-Capillary: 185 mg/dL — ABNORMAL HIGH (ref 70–99)

## 2017-12-01 LAB — BASIC METABOLIC PANEL
Anion gap: 8 (ref 5–15)
BUN: 16 mg/dL (ref 6–20)
CO2: 28 mmol/L (ref 22–32)
Calcium: 8.9 mg/dL (ref 8.9–10.3)
Chloride: 101 mmol/L (ref 98–111)
Creatinine, Ser: 0.96 mg/dL (ref 0.44–1.00)
GFR calc Af Amer: >60 mL/min (ref >60)
GFR calc non Af Amer: >60 mL/min (ref >60)
Glucose, Bld: 140 mg/dL — ABNORMAL HIGH (ref 70–99)
Potassium: 3.8 mmol/L (ref 3.5–5.1)
Sodium: 137 mmol/L (ref 135–145)

## 2017-12-01 LAB — PROTIME-INR
INR: 1.46
Prothrombin Time: 17.6 seconds — ABNORMAL HIGH (ref 11.4–15.2)

## 2017-12-01 LAB — CULTURE, URINE COMPREHENSIVE

## 2017-12-01 LAB — MAGNESIUM: Magnesium: 2.4 mg/dL (ref 1.7–2.4)

## 2017-12-01 MED ORDER — WARFARIN SODIUM 7.5 MG PO TABS
7.5000 mg | ORAL_TABLET | Freq: Every day | ORAL | Status: DC
  Filled 2017-12-01: qty 1

## 2017-12-01 MED ORDER — FENTANYL CITRATE (PF) 100 MCG/2ML IJ SOLN
INTRAMUSCULAR | Status: AC
  Filled 2017-12-01: qty 4

## 2017-12-01 MED ORDER — WARFARIN SODIUM 5 MG PO TABS
5.0000 mg | ORAL_TABLET | Freq: Every day | ORAL | Status: DC
  Administered 2017-12-01 – 2017-12-03 (×3): 5 mg via ORAL
  Filled 2017-12-01 (×4): qty 1

## 2017-12-01 MED ORDER — SODIUM CHLORIDE 0.9 % IV SOLN
INTRAVENOUS | Status: AC | PRN
  Administered 2017-12-01: 50 mL/h via INTRAVENOUS

## 2017-12-01 MED ORDER — FENTANYL CITRATE (PF) 100 MCG/2ML IJ SOLN
INTRAMUSCULAR | Status: AC | PRN
  Administered 2017-12-01 (×2): 50 ug via INTRAVENOUS

## 2017-12-01 MED ORDER — MIDAZOLAM HCL 2 MG/2ML IJ SOLN
INTRAMUSCULAR | Status: AC | PRN
  Administered 2017-12-01 (×2): 1 mg via INTRAVENOUS

## 2017-12-01 MED ORDER — LISINOPRIL 10 MG PO TABS
10.0000 mg | ORAL_TABLET | Freq: Every day | ORAL | Status: DC
  Administered 2017-12-01 – 2017-12-02 (×2): 10 mg via ORAL
  Filled 2017-12-01 (×2): qty 1

## 2017-12-01 MED ORDER — SODIUM CHLORIDE 0.9% FLUSH
5.0000 mL | Freq: Three times a day (TID) | INTRAVENOUS | Status: DC
  Administered 2017-12-01 – 2017-12-06 (×15): 5 mL

## 2017-12-01 MED ORDER — MIDAZOLAM HCL 2 MG/2ML IJ SOLN
INTRAMUSCULAR | Status: AC
  Filled 2017-12-01: qty 4

## 2017-12-01 MED ORDER — LIDOCAINE HCL 1 % IJ SOLN
INTRAMUSCULAR | Status: AC | PRN
  Administered 2017-12-01: 8 mL via INTRADERMAL

## 2017-12-01 MED ORDER — WARFARIN - PHARMACIST DOSING INPATIENT
Freq: Every day | Status: DC
  Administered 2017-12-02 – 2017-12-05 (×3)

## 2017-12-01 MED ORDER — HYDROCORTISONE NA SUCCINATE PF 100 MG IJ SOLR
100.0000 mg | Freq: Every day | INTRAMUSCULAR | Status: DC
  Administered 2017-12-02: 100 mg via INTRAVENOUS
  Filled 2017-12-01: qty 2

## 2017-12-01 NOTE — Progress Notes (Signed)
Patient returned recently from [place] after having JP placed by radiologist

## 2017-12-01 NOTE — Consult Note (Signed)
Chief Complaint: Ruptured appendix  Referring Physician(s): P[place]on  Patient Status: [name] - ED  History of Present Illness: Brenda Santana is a 57 y.o. female past mental history significant for asthma, COPD, diabetes, hyperlipidemia, hypertension, rheumatoid arthritis and DVT (currently on anticoagulation), who presented to the emergency department yesterday after approximately 5 days of intermittent lower abdominal pain.  CT scan performed at that time and demonstrates a large fluid collection within the right lower quadrant compatible with a ruptured periappendiceal abscess.  As such, request made for placement of a CT-guided percutaneous catheter for infection source control purposes.  Since being admitted to the hospital, the patient states that her abdominal pain is minimally improved.  She does report nausea.  No vomiting.  No chest pain shortness of breath.  Past Medical History:  Diagnosis Date  . Asthma   . Atopic dermatitis   . Constipation   . COPD (chronic obstructive pulmonary disease) ([place])   . Diabetes mellitus without complication ([place])   . DVT (deep venous thrombosis) ([place])   . GERD (gastroesophageal reflux disease)   . Hyperlipidemia   . Hypertension   . Rheumatoid arthritis ([place])     Past Surgical History:  Procedure Laterality Date  . right arm surgery    . TUBAL LIGATION      Allergies: Patient has no known allergies.  Medications: Prior to Admission medications   Medication Sig Start Date End Date Taking? Authorizing Provider  acyclovir (ZOVIRAX) 400 MG tablet Take 1 tablet po TID for 10 days then take twice daily Patient taking differently: Take 400 mg by mouth 2 (two) times daily.  11/09/17  Yes Boscia, Greer Ee, NP  acyclovir ointment (ZOVIRAX) 5 % Apply topically every 3 (three) hours. 11/09/17  Yes Boscia, Greer Ee, NP  albuterol (PROVENTIL HFA;VENTOLIN HFA) 108 (90 Base) MCG/ACT inhaler INHALE 2 PUFFS 4 TIMES A DAY AS NEEDED 06/14/17  Yes  Boscia, Heather E, NP  allopurinol (ZYLOPRIM) 300 MG tablet TAKE ONE TAB AT NIGHT FOR GOUT 05/22/17  Yes Ronnell Freshwater, NP  aspirin EC 81 MG tablet Take 81 mg by mouth daily.   Yes [provider]  chlorthalidone (HYGROTON) 25 MG tablet TAKE 1 DAILY EVERY MORNING 09/12/17  Yes Lavera Guise, MD  dicyclomine (BENTYL) 10 MG capsule Take 1 capsule (10 mg total) by mouth 4 (four) times daily -  before meals and at bedtime. 11/28/17  Yes Scarboro, Audie Clear, NP  Ferrous Sulfate (IRON) 325 (65 Fe) MG TABS Take 1 tablet by mouth daily.   Yes [provider]  gabapentin (NEURONTIN) 800 MG tablet Take 800 mg by mouth 3 (three) times daily.   Yes [provider]  INCRUSE ELLIPTA 62.5 MCG/INH AEPB TAKE 1 PUFF BY MOUTH EVERY DAY 07/13/17  Yes Lavera Guise, MD  INVOKAMET 50-500 MG TABS TAKE 1 TABLET BY MOUTH TWICE A DAY 08/24/17  Yes Lavera Guise, MD  ipratropium-albuterol (DUONEB) 0.5-2.5 (3) MG/3ML SOLN Take 3 mLs by nebulization every 6 (six) hours as needed.   Yes [provider]  LINZESS 145 MCG CAPS capsule TAKE ONE CAPSULE BY MOUTH ONCE DAILY FOR CONSTIPATION 10/23/17  Yes Lavera Guise, MD  liraglutide (VICTOZA) 18 MG/3ML SOPN Inject 1.8 mg into the skin every morning.   Yes [provider]  lisinopril (PRINIVIL,ZESTRIL) 10 MG tablet TAKE 1 TAB DAILY IN MORNING 07/28/17  Yes Boscia, Heather E, NP  metoprolol tartrate (LOPRESSOR) 50 MG tablet TAKE 1 TABLET BY MOUTH  TWICE A DAY 10/19/17  Yes Boscia, Heather E, NP  montelukast (SINGULAIR) 10 MG tablet TAKE 1 TABLET BY MOUTH DAILY FOR ASTHMA Patient taking differently: TAKE 1 TABLET BY MOUTH TWICE DAILY FOR ASTHMA 11/21/17  Yes Ronnell Freshwater, NP  phentermine 37.5 MG capsule Take 1 capsule (37.5 mg total) by mouth every morning. 11/09/17  Yes Boscia, Heather E, NP  predniSONE (DELTASONE) 5 MG tablet TAKE 2 TABLETS BY MOUTH EVERY DAY Patient taking differently: TAKE 20MG   BY MOUTH EVERY DAY 09/14/17  Yes Lavera Guise,  MD  warfarin (COUMADIN) 7.5 MG tablet Take 7.5 mg by mouth daily.   Yes [provider]  azithromycin (ZITHROMAX) 250 MG tablet TAKE 1 TABLET BY MOUTH EVERY DAY Patient not taking: Reported on 11/29/2017 10/17/17   Ronnell Freshwater, NP  colchicine 0.6 MG tablet Take 1 tablet (0.6 mg total) by mouth daily. Take 2 tablets at onset of pain, then may take 1 more 1 hour later if pain continues. Patient not taking: Reported on 11/29/2017 09/20/15   Hagler, Jami L, PA-C  HYDROcodone-acetaminophen (NORCO/VICODIN) 5-325 MG tablet Take 1 tablet by mouth every 4 (four) hours as needed. Patient not taking: Reported on 11/29/2017 06/04/17   Harvest Dark, MD  lidocaine (XYLOCAINE) 5 % ointment APPLY A SMALL AMOUNT TO AFFECTED AREA 2-3 TIMES A DAY 04/24/17   Lavera Guise, MD  ondansetron (ZOFRAN) 4 MG tablet Take 1 tablet (4 mg total) by mouth every 8 (eight) hours as needed for nausea or vomiting. Patient not taking: Reported on 11/29/2017 11/28/17   Kendell Bane, NP  traMADol (ULTRAM) 50 MG tablet Take 50-100 mg by mouth every 8 (eight) hours as needed.     [provider]     Family History  Family history unknown: Yes    Social History   Socioeconomic History  . Marital status: Single    Spouse name: Not on file  . Number of children: Not on file  . Years of education: Not on file  . Highest education level: Not on file  Occupational History  . Not on file  Social Needs  . Financial resource strain: Not on file  . Food insecurity:    Worry: Not on file    Inability: Not on file  . Transportation needs:    Medical: Not on file    Non-medical: Not on file  Tobacco Use  . Smoking status: Former Research scientist (life sciences)  . Smokeless tobacco: Never Used  Substance and Sexual Activity  . Alcohol use: No  . Drug use: No  . Sexual activity: Not on file  Lifestyle  . Physical activity:    Days per week: Not on file    Minutes per session: Not on file  . Stress: Not on file    Relationships  . Social connections:    Talks on phone: Not on file    Gets together: Not on file    Attends religious service: Not on file    Active member of club or organization: Not on file    Attends meetings of clubs or organizations: Not on file    Relationship status: Not on file  Other Topics Concern  . Not on file  Social History Narrative  . Not on file    ECOG Status: 1 - Symptomatic but completely ambulatory  Review of Systems: A 12 point ROS discussed and pertinent positives are indicated in the HPI above.  All other systems are negative.  Review of  Systems  Constitutional: Positive for activity change, appetite change and fatigue.  Respiratory: Negative.   Cardiovascular: Negative.   Gastrointestinal: Positive for abdominal pain and nausea.    Vital Signs: BP 114/66 (BP Location: Left Arm)   Pulse 65   Temp (!) 97.5 F (36.4 C) (Oral)   Resp 19   Ht 5\' 5"  (1.651 m)   Wt 240 lb (108.9 kg)   SpO2 (!) 85%   BMI 39.94 kg/m   Physical Exam  Constitutional: She appears well-developed and well-nourished.  HENT:  Head: Normocephalic and atraumatic.  Cardiovascular: Normal rate and regular rhythm.  Pulmonary/Chest: Effort normal and breath sounds normal.  Psychiatric: She has a normal mood and affect. Her behavior is normal.    Imaging: Ct Abdomen Pelvis W Contrast  Result Date: 11/29/2017 CLINICAL DATA:  Abdominal pain, nausea and vomiting for 3 days. EXAM: CT ABDOMEN AND PELVIS WITH CONTRAST TECHNIQUE: Multidetector CT imaging of the abdomen and pelvis was performed using the standard protocol following bolus administration of intravenous contrast. CONTRAST:  75 mL OMNIPAQUE IOHEXOL 300 MG/ML  SOLN COMPARISON:  None. FINDINGS: Lower chest: Mild dependent atelectasis is seen in the lung bases. No pleural or pericardial effusion. Heart size is normal. Hepatobiliary: No focal liver abnormality is seen. No gallstones, gallbladder wall thickening, or biliary  dilatation. Single punctate calcification in the left hepatic lobe is incidentally noted. Pancreas: Unremarkable. No pancreatic ductal dilatation or surrounding inflammatory changes. Spleen: Normal in size without focal abnormality. Adrenals/Urinary Tract: A cyst in lower pole of the left kidney measures 3 cm in diameter. A smaller low attenuating lesion lower pole of the left kidney is also likely a cyst. The kidneys otherwise appear normal. Ureters and urinary bladder appear normal. The adrenal glands are normal appearance. Stomach/Bowel: The patient has extensive diverticulosis. An air and fluid collection measuring 4.9 cm AP x 4.2 cm transverse x 4.9 cm craniocaudal is seen in the right lower quadrant and consistent with a contained perforation/abscess. This appears to be secondary to a ruptured appendix. A hyperattenuating tubular structure tracking along the inferior margin of this collection is likely the appendix. The stomach and small bowel appear normal. Vascular/Lymphatic: Tiny lymph nodes in the anterior aspect of the right lower quadrant are likely reactive. No pathologically enlarged lymph nodes are seen. Scattered atherosclerotic calcifications without aneurysm of the aorta noted. Reproductive: The patient is a fibroid uterus. Adnexa are unremarkable. Other: None. Musculoskeletal: No acute or focal abnormality. There is some degenerative change about the symphysis pubis and mild degenerative disease lower lumbar spine. IMPRESSION: Findings consistent with a contained perforation/abscess in the right lower quadrant which is likely due to ruptured appendix. Diverticulitis is possible but thought less likely. Extensive diverticulosis. Atherosclerosis. Fibroid uterus. These results were called by telephone at the time of interpretation on 11/29/2017 at 1:32 pm to [place], N.P., who verbally acknowledged these results. Electronically Signed   By: Inge Rise M.D.   On: 11/29/2017 13:35     Labs:  CBC: Recent Labs    06/04/17 1140 11/29/17 1711 11/30/17 0427 12/01/17 0640  WBC 9.7 17.9* 14.0* 16.0*  HGB 13.5 15.1 13.5 13.4  HCT 42.5 44.3 40.2 38.2  PLT 316 423 365 336    COAGS: Recent Labs    10/06/17 1030 11/29/17 1749 11/30/17 0427 12/01/17 0547  INR 3.6* 3.25 3.04 1.46  APTT  --  72* 69*  --     BMP: Recent Labs    06/04/17 1140 11/29/17 1305 11/29/17 1711  11/30/17 0427 12/01/17 0640  NA 136  --  131* 135 137  K 3.6  --  4.2 3.3* 3.8  CL 102  --  90* 95* 101  CO2 24  --  28 29 28   GLUCOSE 152*  --  160* 89 140*  BUN 22*  --  25* 24* 16  CALCIUM 9.3  --  9.5 9.0 8.9  CREATININE 0.96 1.50* 1.45* 1.25* 0.96  GFRNONAA >60  --  39* 47* >60  GFRAA >60  --  45* 54* >60    LIVER FUNCTION TESTS: Recent Labs    06/04/17 1140 11/29/17 1711 11/30/17 0427  BILITOT 0.6 0.7 0.6  AST 27 33 22  ALT 22 29 25   ALKPHOS 92 108 91  PROT 7.4 8.5* 7.2  ALBUMIN 4.1 4.0 3.4*    TUMOR MARKERS: No results for input(s): AFPTM, CEA, CA199, CHROMGRNA in the last 8760 hours.  Assessment and Plan:  Brenda Santana is a 57 y.o. female past mental history significant for asthma, COPD, diabetes, hyperlipidemia, hypertension, rheumatoid arthritis and DVT (currently on anticoagulation), who presented to the emergency department yesterday after approximately 5 days of intermittent lower abdominal pain.  CT scan performed at that time and demonstrates a large fluid collection within the right lower quadrant compatible with a ruptured periappendiceal abscess.  As such, request made for placement of a CT-guided percutaneous catheter for infection source control purposes.  Patient's INR is now within an acceptable range following administration of vitamin K yesterday.  As such, risks and benefits of CT guided drain placement was discussed with the patient including bleeding, infection, damage to adjacent structures, bowel perforation/fistula connection, and  sepsis.  All of the patient's questions were answered, patient is agreeable to proceed. Consent signed and in chart.  Thank you for this interesting consult.  I greatly enjoyed meeting Brenda Santana and look forward to participating in their care.  A copy of this report was sent to the requesting provider on this date.  Electronically Signed: Sandi Mariscal, MD 12/01/2017, 9:22 AM   I spent a total of 20 Minutes in face to face in clinical consultation, greater than 50% of which was counseling/coordinating care for CT guided peri-appendiceal abscess drain placement.

## 2017-12-01 NOTE — Progress Notes (Signed)
Dr Verdell Carmine said to discontinue IVF if patient is taking in good po intake.  Patient ate all of her lunch, 122ml, 100%.

## 2017-12-01 NOTE — Progress Notes (Signed)
CC: Appendicitis Subjective: Doing well, INR normal, IR placed a drain w foul smelling abscess Pain improving  Objective: Vital signs in last 24 hours: Temp:  [97.5 F (36.4 C)-98.9 F (37.2 C)] 97.5 F (36.4 C) (08/02 0425) Pulse Rate:  [62-92] 79 (08/02 1030) Resp:  [15-22] 22 (08/02 1030) BP: (112-163)/(66-94) 126/88 (08/02 1030) SpO2:  [85 %-100 %] 96 % (08/02 1030) Last BM Date: 11/28/17  Intake/Output from previous day: 08/01 0701 - 08/02 0700 In: 2914.7 [P.O.:818; I.V.:1747.1; IV Piggyback:349.6] Out: 3700 [Urine:3700] Intake/Output this shift: Total I/O In: 480 [P.O.:480] Out: -   Physical exam:  NAD, alert Abd: soft, + BS, mild TTP, drain w purulent content. No peritonitis Ext: no edema and well perfused  Lab Results: CBC  Recent Labs    11/30/17 0427 12/01/17 0640  WBC 14.0* 16.0*  HGB 13.5 13.4  HCT 40.2 38.2  PLT 365 336   BMET Recent Labs    11/30/17 0427 12/01/17 0640  NA 135 137  K 3.3* 3.8  CL 95* 101  CO2 29 28  GLUCOSE 89 140*  BUN 24* 16  CREATININE 1.25* 0.96  CALCIUM 9.0 8.9   PT/INR Recent Labs    11/30/17 0427 12/01/17 0547  LABPROT 31.2* 17.6*  INR 3.04 1.46   ABG No results for input(s): PHART, HCO3 in the last 72 hours.  Invalid input(s): PCO2, PO2  Studies/Results: Ct Abdomen Pelvis W Contrast  Result Date: 11/29/2017 CLINICAL DATA:  Abdominal pain, nausea and vomiting for 3 days. EXAM: CT ABDOMEN AND PELVIS WITH CONTRAST TECHNIQUE: Multidetector CT imaging of the abdomen and pelvis was performed using the standard protocol following bolus administration of intravenous contrast. CONTRAST:  75 mL OMNIPAQUE IOHEXOL 300 MG/ML  SOLN COMPARISON:  None. FINDINGS: Lower chest: Mild dependent atelectasis is seen in the lung bases. No pleural or pericardial effusion. Heart size is normal. Hepatobiliary: No focal liver abnormality is seen. No gallstones, gallbladder wall thickening, or biliary dilatation. Single punctate  calcification in the left hepatic lobe is incidentally noted. Pancreas: Unremarkable. No pancreatic ductal dilatation or surrounding inflammatory changes. Spleen: Normal in size without focal abnormality. Adrenals/Urinary Tract: A cyst in lower pole of the left kidney measures 3 cm in diameter. A smaller low attenuating lesion lower pole of the left kidney is also likely a cyst. The kidneys otherwise appear normal. Ureters and urinary bladder appear normal. The adrenal glands are normal appearance. Stomach/Bowel: The patient has extensive diverticulosis. An air and fluid collection measuring 4.9 cm AP x 4.2 cm transverse x 4.9 cm craniocaudal is seen in the right lower quadrant and consistent with a contained perforation/abscess. This appears to be secondary to a ruptured appendix. A hyperattenuating tubular structure tracking along the inferior margin of this collection is likely the appendix. The stomach and small bowel appear normal. Vascular/Lymphatic: Tiny lymph nodes in the anterior aspect of the right lower quadrant are likely reactive. No pathologically enlarged lymph nodes are seen. Scattered atherosclerotic calcifications without aneurysm of the aorta noted. Reproductive: The patient is a fibroid uterus. Adnexa are unremarkable. Other: None. Musculoskeletal: No acute or focal abnormality. There is some degenerative change about the symphysis pubis and mild degenerative disease lower lumbar spine. IMPRESSION: Findings consistent with a contained perforation/abscess in the right lower quadrant which is likely due to ruptured appendix. Diverticulitis is possible but thought less likely. Extensive diverticulosis. Atherosclerosis. Fibroid uterus. These results were called by telephone at the time of interpretation on 11/29/2017 at 1:32 pm to [place], N.P.,  who verbally acknowledged these results. Electronically Signed   By: Inge Rise M.D.   On: 11/29/2017 13:35     Anti-infectives: Anti-infectives (From admission, onward)   Start     Dose/Rate Route Frequency Ordered Stop   11/30/17 1400  piperacillin-tazobactam (ZOSYN) IVPB 3.375 g     3.375 g 12.5 mL/hr over 240 Minutes Intravenous Every 8 hours 11/30/17 1010     11/30/17 1000  acyclovir (ZOVIRAX) 200 MG capsule 400 mg     400 mg Oral 2 times daily 11/30/17 0959     11/30/17 0200  piperacillin-tazobactam (ZOSYN) IVPB 3.375 g     3.375 g 12.5 mL/hr over 240 Minutes Intravenous  Once 11/29/17 2136 11/30/17 0501   11/29/17 2200  acyclovir (ZOVIRAX) tablet 400 mg  Status:  Discontinued    Note to Pharmacy:  Take 1 tablet po TID for 10 days then take twice daily     400 mg Oral 2 times daily 11/29/17 2136 11/30/17 0959   11/29/17 1745  piperacillin-tazobactam (ZOSYN) IVPB 3.375 g     3.375 g 100 mL/hr over 30 Minutes Intravenous  Once 11/29/17 1732 11/29/17 1838      Assessment/Plan:  Periappendiceal abscess s/p IR drainage Continue a/bs Advance diet Anticipate DC Sunday or Monday No surgical intervention Start  Coumadin  Caroleen Hamman, MD, FACS  12/01/2017

## 2017-12-01 NOTE — Consult Note (Signed)
ANTICOAGULATION CONSULT NOTE - Initial Consult  Pharmacy Consult for Warfarin Indication: secondary DVT (7 years ago)  No Known Allergies  Patient Measurements: Height: 5\' 5"  (165.1 cm) Weight: 240 lb (108.9 kg) IBW/kg (Calculated) : 57   Vital Signs: Temp: 97.5 F (36.4 C) (08/02 1201) Temp Source: Oral (08/02 1046) BP: 123/71 (08/02 1201) Pulse Rate: 71 (08/02 1201)  Labs: Recent Labs    11/29/17 1711 11/29/17 1749 11/30/17 0427 12/01/17 0547 12/01/17 0640  HGB 15.1  --  13.5  --  13.4  HCT 44.3  --  40.2  --  38.2  PLT 423  --  365  --  336  APTT  --  72* 69*  --   --   LABPROT  --  32.9* 31.2* 17.6*  --   INR  --  3.25 3.04 1.46  --   CREATININE 1.45*  --  1.25*  --  0.96    Estimated Creatinine Clearance: 79.4 mL/min (by C-G formula based on SCr of 0.96 mg/dL).   Medical History: Past Medical History:  Diagnosis Date  . Asthma   . Atopic dermatitis   . Constipation   . COPD (chronic obstructive pulmonary disease) ([place])   . Diabetes mellitus without complication ([place])   . DVT (deep venous thrombosis) ([place])   . GERD (gastroesophageal reflux disease)   . Hyperlipidemia   . Hypertension   . Rheumatoid arthritis ([facility])     Medications:  Scheduled:  . acyclovir  400 mg Oral BID  . chlorthalidone  25 mg Oral Daily  . fentaNYL      . fluticasone  1 spray Each Nare Daily  . gabapentin  800 mg Oral TID  . [START ON 12/02/2017] hydrocortisone sod succinate (SOLU-CORTEF) inj  100 mg Intravenous Daily  . insulin aspart  0-20 Units Subcutaneous TID WC  . insulin aspart  0-5 Units Subcutaneous QHS  . lisinopril  10 mg Oral Daily  . metoprolol tartrate  50 mg Oral BID  . midazolam      . montelukast  10 mg Oral QHS  . sodium chloride flush  5 mL Intracatheter Q8H  . umeclidinium bromide  1 puff Inhalation Daily  . warfarin  7.5 mg Oral q1800  . Warfarin - Pharmacist Dosing Inpatient   Does not apply q44     57 yo female who has a history of DVT (~7  years ago) s/p drain placement for ruptured appendix. Patient was taking warfarin 7.5 mg daily PTA.  7/30 Last warfarin dose prior to surgery 7/31  INR: 3.25  8/1 INR: 3.04 Vitamin K 1 mg IV 8/2 INR: 1.46 Warfarin    Assessment & Plan:  Patient received vitamin K 1 mg iv 8/1. INR above goal prior to surgery. Also, patient is on Zosyn. Will order reduced dose of warfarin 5 mg daily and f/u AM INR.   Ulice Dash D, PharmD Pharmacy Resident  12/01/2017 3:04 PM

## 2017-12-01 NOTE — Consult Note (Signed)
ANTICOAGULATION CONSULT NOTE - Initial Consult  Pharmacy Consult for Warfarin Indication: secondary VTE prophylaxis (7 years ago)  No Known Allergies  Patient Measurements: Height: 5\' 5"  (165.1 cm) Weight: 240 lb (108.9 kg) IBW/kg (Calculated) : 57   Vital Signs: Temp: 97.5 F (36.4 C) (08/02 1201) Temp Source: Oral (08/02 1046) BP: 123/71 (08/02 1201) Pulse Rate: 71 (08/02 1201)  Labs: Recent Labs    11/29/17 1711 11/29/17 1749 11/30/17 0427 12/01/17 0547 12/01/17 0640  HGB 15.1  --  13.5  --  13.4  HCT 44.3  --  40.2  --  38.2  PLT 423  --  365  --  336  APTT  --  72* 69*  --   --   LABPROT  --  32.9* 31.2* 17.6*  --   INR  --  3.25 3.04 1.46  --   CREATININE 1.45*  --  1.25*  --  0.96    Estimated Creatinine Clearance: 79.4 mL/min (by C-G formula based on SCr of 0.96 mg/dL).   Medical History: Past Medical History:  Diagnosis Date  . Asthma   . Atopic dermatitis   . Constipation   . COPD (chronic obstructive pulmonary disease) ([place])   . Diabetes mellitus without complication ([place])   . DVT (deep venous thrombosis) ([place])   . GERD (gastroesophageal reflux disease)   . Hyperlipidemia   . Hypertension   . Rheumatoid arthritis ([facility])     Medications:  Scheduled:  . acyclovir  400 mg Oral BID  . chlorthalidone  25 mg Oral Daily  . fentaNYL      . fluticasone  1 spray Each Nare Daily  . gabapentin  800 mg Oral TID  . [START ON 12/02/2017] hydrocortisone sod succinate (SOLU-CORTEF) inj  100 mg Intravenous Daily  . insulin aspart  0-20 Units Subcutaneous TID WC  . insulin aspart  0-5 Units Subcutaneous QHS  . metoprolol tartrate  50 mg Oral BID  . midazolam      . montelukast  10 mg Oral QHS  . sodium chloride flush  5 mL Intracatheter Q8H  . umeclidinium bromide  1 puff Inhalation Daily  . warfarin  7.5 mg Oral q1800  . Warfarin - Pharmacist Dosing Inpatient   Does not apply q1800    Assessment & Plan: 57 yo female who has a history of DVT (~7 years  ago) s/p drain placement for ruptured appendix. Restart home Warfarin of 7.5 mg daily as patient was therapeutic on this regimen prior to admission. Will check INR daily.     Paticia Stack, PharmD Pharmacy Resident  12/01/2017 1:48 PM

## 2017-12-01 NOTE — Procedures (Signed)
Pre procedural Dx: Peri-appendiceal Abscess Post procedural Dx: Same  Technically successful CT guided placed of a 10 Fr drainage catheter placement into the periappendiceal abscess yielding 50 cc of purulent, foul smelling fluid.    All aspirated samples sent to the laboratory for analysis.    EBL: Minimal  Complications: None immediate  Ronny Bacon, MD Pager #: [phone]

## 2017-12-01 NOTE — Progress Notes (Signed)
[facility] [place] NAME: Brenda Santana    MR#:  [id number]  DATE OF BIRTH:  17-Nov-1960  SUBJECTIVE:   Here due to abdominal pain noted to have a periappendiceal abscess.  Patient is status post IR guided drainage of the abscess today.  Patient still complaining of some lower abdominal pain but no other associated symptoms presently.  REVIEW OF SYSTEMS:    Review of Systems  Constitutional: Negative for chills and fever.  HENT: Negative for congestion and tinnitus.   Eyes: Negative for blurred vision and double vision.  Respiratory: Negative for cough, shortness of breath and wheezing.   Cardiovascular: Negative for chest pain, orthopnea and PND.  Gastrointestinal: Positive for abdominal pain. Negative for diarrhea, nausea and vomiting.  Genitourinary: Negative for dysuria and hematuria.  Neurological: Negative for dizziness, sensory change and focal weakness.  All other systems reviewed and are negative.   Nutrition: Full liquids Tolerating Diet: Yes Tolerating PT: Yes  DRUG ALLERGIES:  No Known Allergies  VITALS:  Blood pressure 123/71, pulse 71, temperature (!) 97.5 F (36.4 C), resp. rate 20, height 5\' 5"  (1.651 m), weight 108.9 kg (240 lb), SpO2 100 %.  PHYSICAL EXAMINATION:   Physical Exam  GENERAL:  57 y.o.-year-old patient lying in bed in no acute distress.  EYES: Pupils equal, round, reactive to light and accommodation. No scleral icterus. Extraocular muscles intact.  HEENT: Head atraumatic, normocephalic. Oropharynx and nasopharynx clear.  NECK:  Supple, no jugular venous distention. No thyroid enlargement, no tenderness.  LUNGS: Normal breath sounds bilaterally, no wheezing, rales, rhonchi. No use of accessory muscles of respiration.  CARDIOVASCULAR: S1, S2 normal. No murmurs, rubs, or gallops.  ABDOMEN: Soft, nontender, nondistended. Bowel sounds present. No organomegaly or mass. JP drain on the RLQ with some bloody  drainage noted.  EXTREMITIES: No cyanosis, clubbing or edema b/l.    NEUROLOGIC: Cranial nerves II through XII are intact. No focal Motor or sensory deficits b/l.   PSYCHIATRIC: The patient is alert and oriented x 3.  SKIN: No obvious rash, lesion, or ulcer.    LABORATORY PANEL:   CBC Recent Labs  Lab 12/01/17 0640  WBC 16.0*  HGB 13.4  HCT 38.2  PLT 336   ------------------------------------------------------------------------------------------------------------------  Chemistries  Recent Labs  Lab 11/30/17 0427 12/01/17 0640  NA 135 137  K 3.3* 3.8  CL 95* 101  CO2 29 28  GLUCOSE 89 140*  BUN 24* 16  CREATININE 1.25* 0.96  CALCIUM 9.0 8.9  MG 2.4 2.4  AST 22  --   ALT 25  --   ALKPHOS 91  --   BILITOT 0.6  --    ------------------------------------------------------------------------------------------------------------------  Cardiac Enzymes No results for input(s): TROPONINI in the last 168 hours. ------------------------------------------------------------------------------------------------------------------  RADIOLOGY:  Ct Image Guided Drainage Percut Cath  Peritoneal Retroperit  Result Date: 12/01/2017 INDICATION: Ruptured appendicitis. Please perform CT-guided periappendiceal abscess drainage catheter for infection source control purposes. EXAM: CT-GUIDED PERIAPPENDICEAL ABSCESS DRAINAGE CATHETER PLACEMENT COMPARISON:  CT abdomen and pelvis - 11/29/2017 MEDICATIONS: The patient is currently admitted to the hospital and receiving intravenous antibiotics. The antibiotics were administered within an appropriate time frame prior to the initiation of the procedure. ANESTHESIA/SEDATION: Moderate (conscious) sedation was employed during this procedure. A total of Versed 2 mg and Fentanyl 100 mcg was administered intravenously. Moderate Sedation Time: 18 minutes. The patient's level of consciousness and vital signs were monitored continuously by radiology nursing  throughout the procedure under my direct  supervision. CONTRAST:  None COMPLICATIONS: None immediate. PROCEDURE: Informed written consent was obtained from the patient after a discussion of the risks, benefits and alternatives to treatment. The patient was placed supine on the CT gantry and a pre procedural CT was performed re-demonstrating the known abscess/fluid collection within the right lower abdominal quadrant with dominant air in fluid containing collection measuring approximately 5.5 x 4.9 cm (image 29, series 2). The procedure was planned. A timeout was performed prior to the initiation of the procedure. The skin overlying the right inferior lateral abdomen was prepped and draped in the usual sterile fashion. The overlying soft tissues were anesthetized with 1% lidocaine with epinephrine. Appropriate trajectory was planned with the use of a 22 gauge spinal needle. An 18 gauge trocar needle was advanced into the abscess/fluid collection and a short Amplatz super stiff wire was coiled within the collection. Appropriate positioning was confirmed with a limited CT scan. The tract was serially dilated allowing placement of a 10 [place] all-purpose drainage catheter. Appropriate positioning was confirmed with a limited postprocedural CT scan. Approximately 50 ml of purulent fluid was aspirated. The tube was connected to a JP bulb and sutured in place. A dressing was placed. The patient tolerated the procedure well without immediate post procedural complication. IMPRESSION: Successful CT guided placement of a 10 French all purpose drain catheter into the periappendiceal abscess with aspiration of 50 mL of purulent fluid. Samples were sent to the laboratory as requested by the ordering clinical team. Electronically Signed   By: Sandi Mariscal M.D.   On: 12/01/2017 10:44     ASSESSMENT AND PLAN:   57 year old female with past medical history of essential hypertension, rheumatoid arthritis, COPD, diabetes, previous  history of DVT who presents to the hospital due to abdominal pain with CT scan findings suggestive of a periappendiceal abscess.  1.  Periappendiceal abscess-this was a cause of patient abdominal pain.  Patient is status post IR guided drainage placement.  Patient has a JP drain placed. -Continue further care as per general surgery.  Continue empiric IV antibiotics with Zosyn.  Patient is afebrile, hemodynamically stable denies any worsening abdominal pain.  2.  History of COPD-no acute exacerbation. -Continue albuterol nebulizers as needed, duo nebs as needed -Patient is on chronic prednisone and was given some stress dose IV steroids which I will taper further and switch to oral prednisone tomorrow.  3.  Essential hypertension-continue lisinopril, metoprolol, chlorthalidone.  4.  DM type II without complication-continue sliding scale insulin.  Follow blood sugars.  5.  DM neuropathy-continue gabapentin.  6. Hx of previous DVT - cont. Coumadin.    All the records are reviewed and case discussed with Care Management/Social Worker. Management plans discussed with the patient, family and they are in agreement.  CODE STATUS: Full code  DVT Prophylaxis: Warfarin  TOTAL TIME TAKING CARE OF THIS PATIENT: 30 minutes.   POSSIBLE D/C IN 2-3 DAYS, DEPENDING ON CLINICAL CONDITION.   Henreitta Leber M.D on 12/01/2017 at 2:35 PM  Between 7am to 6pm - Pager - [phone]  After 6pm go to [email or web address] - password [email or web address] [facility]  Office  [phone]  CC: Primary care physician; Lavera Guise, MD

## 2017-12-01 NOTE — Care Management Important Message (Signed)
Copy of signed IM left with patient in room.  

## 2017-12-01 NOTE — Sedation Documentation (Signed)
Report called to Rochel Brome, pt transferred to inpt room recovery to be completed there

## 2017-12-02 LAB — BASIC METABOLIC PANEL
Anion gap: 11 (ref 5–15)
BUN: 18 mg/dL (ref 6–20)
CO2: 29 mmol/L (ref 22–32)
Calcium: 9.1 mg/dL (ref 8.9–10.3)
Chloride: 93 mmol/L — ABNORMAL LOW (ref 98–111)
Creatinine, Ser: 1.74 mg/dL — ABNORMAL HIGH (ref 0.44–1.00)
GFR calc Af Amer: 36 mL/min — ABNORMAL LOW (ref >60)
GFR calc non Af Amer: 31 mL/min — ABNORMAL LOW (ref >60)
Glucose, Bld: 170 mg/dL — ABNORMAL HIGH (ref 70–99)
Potassium: 3.7 mmol/L (ref 3.5–5.1)
Sodium: 133 mmol/L — ABNORMAL LOW (ref 135–145)

## 2017-12-02 LAB — CBC
HCT: 42 % (ref 35.0–47.0)
Hemoglobin: 14.1 g/dL (ref 12.0–16.0)
MCH: 33.5 pg (ref 26.0–34.0)
MCHC: 33.6 g/dL (ref 32.0–36.0)
MCV: 99.6 fL (ref 80.0–100.0)
Platelets: 378 10*3/uL (ref 150–440)
RBC: 4.21 MIL/uL (ref 3.80–5.20)
RDW: 15 % — ABNORMAL HIGH (ref 11.5–14.5)
WBC: 20.9 10*3/uL — ABNORMAL HIGH (ref 3.6–11.0)

## 2017-12-02 LAB — GLUCOSE, CAPILLARY
Glucose-Capillary: 113 mg/dL — ABNORMAL HIGH (ref 70–99)
Glucose-Capillary: 153 mg/dL — ABNORMAL HIGH (ref 70–99)
Glucose-Capillary: 190 mg/dL — ABNORMAL HIGH (ref 70–99)
Glucose-Capillary: 134 mg/dL — ABNORMAL HIGH (ref 70–99)

## 2017-12-02 LAB — PROTIME-INR
INR: 1.73
Prothrombin Time: 20.1 seconds — ABNORMAL HIGH (ref 11.4–15.2)

## 2017-12-02 MED ORDER — LACTULOSE 10 GM/15ML PO SOLN
20.0000 g | Freq: Once | ORAL | Status: AC
  Administered 2017-12-02: 20 g via ORAL

## 2017-12-02 MED ORDER — PREDNISONE 20 MG PO TABS
10.0000 mg | ORAL_TABLET | Freq: Every day | ORAL | Status: DC
  Administered 2017-12-02 – 2017-12-04 (×3): 10 mg via ORAL
  Filled 2017-12-02 (×3): qty 1

## 2017-12-02 MED ORDER — LACTULOSE 10 GM/15ML PO SOLN
30.0000 g | Freq: Once | ORAL | Status: DC
  Filled 2017-12-02: qty 60

## 2017-12-02 MED ORDER — GABAPENTIN 400 MG PO CAPS
700.0000 mg | ORAL_CAPSULE | Freq: Two times a day (BID) | ORAL | Status: DC
  Administered 2017-12-03 – 2017-12-05 (×4): 700 mg via ORAL
  Filled 2017-12-02 (×8): qty 1

## 2017-12-02 NOTE — Consult Note (Signed)
ANTICOAGULATION CONSULT NOTE - Initial Consult  Pharmacy Consult for Warfarin Indication: secondary DVT (7 years ago)  No Known Allergies  Patient Measurements: Height: 5\' 5"  (165.1 cm) Weight: 240 lb (108.9 kg) IBW/kg (Calculated) : 57   Vital Signs: Temp: 99 F (37.2 C) (08/03 1302) Temp Source: Oral (08/03 1302) BP: 106/67 (08/03 1302) Pulse Rate: 87 (08/03 1302)  Labs: Recent Labs    11/29/17 1749 11/30/17 0427 12/01/17 0547 12/01/17 0640 12/02/17 1245  HGB  --  13.5  --  13.4 14.1  HCT  --  40.2  --  38.2 42.0  PLT  --  365  --  336 378  APTT 72* 69*  --   --   --   LABPROT 32.9* 31.2* 17.6*  --  20.1*  INR 3.25 3.04 1.46  --  1.73  CREATININE  --  1.25*  --  0.96 1.74*    Estimated Creatinine Clearance: 43.8 mL/min (A) (by C-G formula based on SCr of 1.74 mg/dL (H)).   Medical History: Past Medical History:  Diagnosis Date  . Asthma   . Atopic dermatitis   . Constipation   . COPD (chronic obstructive pulmonary disease) ([place])   . Diabetes mellitus without complication ([place])   . DVT (deep venous thrombosis) ([place])   . GERD (gastroesophageal reflux disease)   . Hyperlipidemia   . Hypertension   . Rheumatoid arthritis ([facility])     Medications:  Scheduled:  . acyclovir  400 mg Oral BID  . chlorthalidone  25 mg Oral Daily  . fluticasone  1 spray Each Nare Daily  . gabapentin  800 mg Oral TID  . insulin aspart  0-20 Units Subcutaneous TID WC  . insulin aspart  0-5 Units Subcutaneous QHS  . lisinopril  10 mg Oral Daily  . metoprolol tartrate  50 mg Oral BID  . montelukast  10 mg Oral QHS  . predniSONE  10 mg Oral Q breakfast  . sodium chloride flush  5 mL Intracatheter Q8H  . umeclidinium bromide  1 puff Inhalation Daily  . warfarin  5 mg Oral q1800  . Warfarin - Pharmacist Dosing Inpatient   Does not apply q21     57 yo female who has a history of DVT (~7 years ago) s/p drain placement for ruptured appendix. Patient was taking warfarin 7.5 mg  daily PTA.  7/30 Last warfarin dose prior to surgery 7/31  INR: 3.25  8/1 INR: 3.04 Vitamin K 1 mg IV 8/2 INR: 1.46 Warfarin 5 mg  8/3 INR: 1.73    Assessment & Plan:  Patient received vitamin K 1 mg iv 8/1. INR above goal prior to surgery. Also, patient is on Zosyn. Will order reduced dose of warfarin 5 mg daily and f/u AM INR.  12/02/2017  Continue warfarin 5 mg po daily.   Laural Benes, PharmD, BCPS Clinical Pharmacist 12/02/2017 1:37 PM

## 2017-12-02 NOTE — Progress Notes (Signed)
[facility] [place] NAME: Brenda Santana    MR#:  [id number]  DATE OF BIRTH:  1960/07/25  SUBJECTIVE:   Here due to abdominal pain noted to have a periappendiceal abscess.  Patient is status post IR guided drainage of the abscess yesterday.  Patient still complaining of some lower abdominal pain but no other associated symptoms presently.  REVIEW OF SYSTEMS:    Review of Systems  Constitutional: Negative for chills and fever.  HENT: Negative for congestion and tinnitus.   Eyes: Negative for blurred vision and double vision.  Respiratory: Negative for cough, shortness of breath and wheezing.   Cardiovascular: Negative for chest pain, orthopnea and PND.  Gastrointestinal: Positive for abdominal pain. Negative for diarrhea, nausea and vomiting.  Genitourinary: Negative for dysuria and hematuria.  Neurological: Negative for dizziness, sensory change and focal weakness.  All other systems reviewed and are negative.   Nutrition: Full liquids Tolerating Diet: Yes Tolerating PT: yes ambulatory  DRUG ALLERGIES:  No Known Allergies  VITALS:  Blood pressure (!) 110/57, pulse 96, temperature 97.7 F (36.5 C), temperature source Oral, resp. rate 20, height 5\' 5"  (1.651 m), weight 108.9 kg (240 lb), SpO2 96 %.  PHYSICAL EXAMINATION:   Physical Exam  GENERAL:  57 y.o.-year-old patient lying in bed in no acute distress.  EYES: Pupils equal, round, reactive to light and accommodation. No scleral icterus. Extraocular muscles intact.  HEENT: Head atraumatic, normocephalic. Oropharynx and nasopharynx clear.  NECK:  Supple, no jugular venous distention. No thyroid enlargement, no tenderness.  LUNGS: Normal breath sounds bilaterally, no wheezing, rales, rhonchi. No use of accessory muscles of respiration.  CARDIOVASCULAR: S1, S2 normal. No murmurs, rubs, or gallops.  ABDOMEN: Soft, Tender in the RLQ, no rebound, rigidity, nondistended. Bowel sounds  present. No organomegaly or mass. JP drain on the RLQ with some bloody drainage noted.  EXTREMITIES: No cyanosis, clubbing or edema b/l.    NEUROLOGIC: Cranial nerves II through XII are intact. No focal Motor or sensory deficits b/l.   PSYCHIATRIC: The patient is alert and oriented x 3.  SKIN: No obvious rash, lesion, or ulcer.    LABORATORY PANEL:   CBC Recent Labs  Lab 12/01/17 0640  WBC 16.0*  HGB 13.4  HCT 38.2  PLT 336   ------------------------------------------------------------------------------------------------------------------  Chemistries  Recent Labs  Lab 11/30/17 0427 12/01/17 0640  NA 135 137  K 3.3* 3.8  CL 95* 101  CO2 29 28  GLUCOSE 89 140*  BUN 24* 16  CREATININE 1.25* 0.96  CALCIUM 9.0 8.9  MG 2.4 2.4  AST 22  --   ALT 25  --   ALKPHOS 91  --   BILITOT 0.6  --    ------------------------------------------------------------------------------------------------------------------  Cardiac Enzymes No results for input(s): TROPONINI in the last 168 hours. ------------------------------------------------------------------------------------------------------------------  RADIOLOGY:  Ct Image Guided Drainage Percut Cath  Peritoneal Retroperit  Result Date: 12/01/2017 INDICATION: Ruptured appendicitis. Please perform CT-guided periappendiceal abscess drainage catheter for infection source control purposes. EXAM: CT-GUIDED PERIAPPENDICEAL ABSCESS DRAINAGE CATHETER PLACEMENT COMPARISON:  CT abdomen and pelvis - 11/29/2017 MEDICATIONS: The patient is currently admitted to the hospital and receiving intravenous antibiotics. The antibiotics were administered within an appropriate time frame prior to the initiation of the procedure. ANESTHESIA/SEDATION: Moderate (conscious) sedation was employed during this procedure. A total of Versed 2 mg and Fentanyl 100 mcg was administered intravenously. Moderate Sedation Time: 18 minutes. The patient's level of consciousness  and vital signs were monitored  continuously by radiology nursing throughout the procedure under my direct supervision. CONTRAST:  None COMPLICATIONS: None immediate. PROCEDURE: Informed written consent was obtained from the patient after a discussion of the risks, benefits and alternatives to treatment. The patient was placed supine on the CT gantry and a pre procedural CT was performed re-demonstrating the known abscess/fluid collection within the right lower abdominal quadrant with dominant air in fluid containing collection measuring approximately 5.5 x 4.9 cm (image 29, series 2). The procedure was planned. A timeout was performed prior to the initiation of the procedure. The skin overlying the right inferior lateral abdomen was prepped and draped in the usual sterile fashion. The overlying soft tissues were anesthetized with 1% lidocaine with epinephrine. Appropriate trajectory was planned with the use of a 22 gauge spinal needle. An 18 gauge trocar needle was advanced into the abscess/fluid collection and a short Amplatz super stiff wire was coiled within the collection. Appropriate positioning was confirmed with a limited CT scan. The tract was serially dilated allowing placement of a 10 [place] all-purpose drainage catheter. Appropriate positioning was confirmed with a limited postprocedural CT scan. Approximately 50 ml of purulent fluid was aspirated. The tube was connected to a JP bulb and sutured in place. A dressing was placed. The patient tolerated the procedure well without immediate post procedural complication. IMPRESSION: Successful CT guided placement of a 10 French all purpose drain catheter into the periappendiceal abscess with aspiration of 50 mL of purulent fluid. Samples were sent to the laboratory as requested by the ordering clinical team. Electronically Signed   By: Sandi Mariscal M.D.   On: 12/01/2017 10:44     ASSESSMENT AND PLAN:   57 year old female with past medical history of  essential hypertension, rheumatoid arthritis, COPD, diabetes, previous history of DVT who presents to the hospital due to abdominal pain with CT scan findings suggestive of a periappendiceal abscess.  1.  Periappendiceal abscess-this was a cause of patient abdominal pain.  Patient is status post IR guided drainage placement.  Patient has a JP drain placed. -Continue further care as per general surgery.  Continue empiric IV antibiotics with Zosyn.  Patient is afebrile, hemodynamically stable denies any worsening abdominal pain. -Continue full liquid diet for now.  2.  History of COPD-no acute exacerbation. -Continue albuterol nebulizers as needed, duo nebs as needed -Wean patient's IV Solu-Cortef and place her back on her oral prednisone.  3.  Essential hypertension-continue lisinopril, metoprolol, chlorthalidone. -BP stable  4.  DM type II without complication-continue sliding scale insulin.   -Blood sugar stable  5.  DM neuropathy-continue gabapentin.  6. Hx of previous DVT - cont. Coumadin.   -INR subtherapeutic and continue dosing as per pharmacy.  All the records are reviewed and case discussed with Care Management/Social Worker. Management plans discussed with the patient, family and they are in agreement.  CODE STATUS: Full code  DVT Prophylaxis: Warfarin  TOTAL TIME TAKING CARE OF THIS PATIENT: 25 minutes.   POSSIBLE D/C IN 2-3 DAYS, DEPENDING ON CLINICAL CONDITION.   Henreitta Leber M.D on 12/02/2017 at 12:05 PM  Between 7am to 6pm - Pager - [phone]  After 6pm go to [email or web address] - password [email or web address] [facility]  Office  [phone]  CC: Primary care physician; Lavera Guise, MD

## 2017-12-02 NOTE — Progress Notes (Signed)
CC: Ruptured appendicitis Subjective: This a patient with ruptured appendicitis treated with IV antibiotics as well as drainage.  She has minimal pain this morning.  No fevers or chills.  No nausea or vomiting.  Objective: Vital signs in last 24 hours: Temp:  [97.5 F (36.4 C)-97.7 F (36.5 C)] 97.7 F (36.5 C) (08/03 0355) Pulse Rate:  [62-103] 103 (08/03 0355) Resp:  [15-26] 20 (08/03 0355) BP: (112-163)/(60-94) 120/60 (08/03 0355) SpO2:  [90 %-100 %] 96 % (08/03 0355) Last BM Date: 11/28/17  Intake/Output from previous day: 08/02 0701 - 08/03 0700 In: 4122.2 [P.O.:1680; I.V.:2130; IV Piggyback:312.2] Out: 2600 [Urine:2500; Drains:100] Intake/Output this shift: No intake/output data recorded.  Physical exam:  Vital signs reviewed and stable Abdomen distended and minimally tender Drain in place with bloody fluid. Nontender calves No icterus no jaundice  Lab Results: CBC  Recent Labs    11/30/17 0427 12/01/17 0640  WBC 14.0* 16.0*  HGB 13.5 13.4  HCT 40.2 38.2  PLT 365 336   BMET Recent Labs    11/30/17 0427 12/01/17 0640  NA 135 137  K 3.3* 3.8  CL 95* 101  CO2 29 28  GLUCOSE 89 140*  BUN 24* 16  CREATININE 1.25* 0.96  CALCIUM 9.0 8.9   PT/INR Recent Labs    11/30/17 0427 12/01/17 0547  LABPROT 31.2* 17.6*  INR 3.04 1.46   ABG No results for input(s): PHART, HCO3 in the last 72 hours.  Invalid input(s): PCO2, PO2  Studies/Results: Ct Image Guided Drainage Percut Cath  Peritoneal Retroperit  Result Date: 12/01/2017 INDICATION: Ruptured appendicitis. Please perform CT-guided periappendiceal abscess drainage catheter for infection source control purposes. EXAM: CT-GUIDED PERIAPPENDICEAL ABSCESS DRAINAGE CATHETER PLACEMENT COMPARISON:  CT abdomen and pelvis - 11/29/2017 MEDICATIONS: The patient is currently admitted to the hospital and receiving intravenous antibiotics. The antibiotics were administered within an appropriate time frame prior to the  initiation of the procedure. ANESTHESIA/SEDATION: Moderate (conscious) sedation was employed during this procedure. A total of Versed 2 mg and Fentanyl 100 mcg was administered intravenously. Moderate Sedation Time: 18 minutes. The patient's level of consciousness and vital signs were monitored continuously by radiology nursing throughout the procedure under my direct supervision. CONTRAST:  None COMPLICATIONS: None immediate. PROCEDURE: Informed written consent was obtained from the patient after a discussion of the risks, benefits and alternatives to treatment. The patient was placed supine on the CT gantry and a pre procedural CT was performed re-demonstrating the known abscess/fluid collection within the right lower abdominal quadrant with dominant air in fluid containing collection measuring approximately 5.5 x 4.9 cm (image 29, series 2). The procedure was planned. A timeout was performed prior to the initiation of the procedure. The skin overlying the right inferior lateral abdomen was prepped and draped in the usual sterile fashion. The overlying soft tissues were anesthetized with 1% lidocaine with epinephrine. Appropriate trajectory was planned with the use of a 22 gauge spinal needle. An 18 gauge trocar needle was advanced into the abscess/fluid collection and a short Amplatz super stiff wire was coiled within the collection. Appropriate positioning was confirmed with a limited CT scan. The tract was serially dilated allowing placement of a 10 [place] all-purpose drainage catheter. Appropriate positioning was confirmed with a limited postprocedural CT scan. Approximately 50 ml of purulent fluid was aspirated. The tube was connected to a JP bulb and sutured in place. A dressing was placed. The patient tolerated the procedure well without immediate post procedural complication. IMPRESSION: Successful CT guided placement  of a 10 [place] all purpose drain catheter into the periappendiceal abscess with  aspiration of 50 mL of purulent fluid. Samples were sent to the laboratory as requested by the ordering clinical team. Electronically Signed   By: Sandi Mariscal M.D.   On: 12/01/2017 10:44    Anti-infectives: Anti-infectives (From admission, onward)   Start     Dose/Rate Route Frequency Ordered Stop   11/30/17 1400  piperacillin-tazobactam (ZOSYN) IVPB 3.375 g     3.375 g 12.5 mL/hr over 240 Minutes Intravenous Every 8 hours 11/30/17 1010     11/30/17 1000  acyclovir (ZOVIRAX) 200 MG capsule 400 mg     400 mg Oral 2 times daily 11/30/17 0959     11/30/17 0200  piperacillin-tazobactam (ZOSYN) IVPB 3.375 g     3.375 g 12.5 mL/hr over 240 Minutes Intravenous  Once 11/29/17 2136 11/30/17 0501   11/29/17 2200  acyclovir (ZOVIRAX) tablet 400 mg  Status:  Discontinued    Note to Pharmacy:  Take 1 tablet po TID for 10 days then take twice daily     400 mg Oral 2 times daily 11/29/17 2136 11/30/17 0959   11/29/17 1745  piperacillin-tazobactam (ZOSYN) IVPB 3.375 g     3.375 g 100 mL/hr over 30 Minutes Intravenous  Once 11/29/17 1732 11/29/17 1838      Assessment/Plan:  No labs this morning for review. Will check white blood cell count tomorrow. To new IV antibiotics for now and advance diet slowly.  Florene Glen, MD, FACS  12/02/2017

## 2017-12-03 LAB — AEROBIC/ANAEROBIC CULTURE W GRAM STAIN (SURGICAL/DEEP WOUND)

## 2017-12-03 LAB — GLUCOSE, CAPILLARY
Glucose-Capillary: 110 mg/dL — ABNORMAL HIGH (ref 70–99)
Glucose-Capillary: 274 mg/dL — ABNORMAL HIGH (ref 70–99)
Glucose-Capillary: 173 mg/dL — ABNORMAL HIGH (ref 70–99)
Glucose-Capillary: 164 mg/dL — ABNORMAL HIGH (ref 70–99)

## 2017-12-03 LAB — AEROBIC/ANAEROBIC CULTURE (SURGICAL/DEEP WOUND): Special Requests: NORMAL

## 2017-12-03 LAB — PROTIME-INR
INR: 1.76
Prothrombin Time: 20.4 seconds — ABNORMAL HIGH (ref 11.4–15.2)

## 2017-12-03 NOTE — Progress Notes (Signed)
[place] [facility] [place] NAME: Brenda Santana    MR#:  [id number]  DATE OF BIRTH:  1961/04/19  SUBJECTIVE:   Here due to abdominal pain noted to have a periappendiceal abscess.  Patient is status post IR guided drainage of the abscess yesterday.  Patient still having some significant abdominal pain, surgery following and continue supportive care for now.  Patient does have an elevated white cell count but is afebrile.  REVIEW OF SYSTEMS:    Review of Systems  Constitutional: Negative for chills and fever.  HENT: Negative for congestion and tinnitus.   Eyes: Negative for blurred vision and double vision.  Respiratory: Negative for cough, shortness of breath and wheezing.   Cardiovascular: Negative for chest pain, orthopnea and PND.  Gastrointestinal: Positive for abdominal pain. Negative for diarrhea, nausea and vomiting.  Genitourinary: Negative for dysuria and hematuria.  Neurological: Negative for dizziness, sensory change and focal weakness.  All other systems reviewed and are negative.   Nutrition: Full liquids Tolerating Diet: Yes Tolerating PT: yes ambulatory  DRUG ALLERGIES:  No Known Allergies  VITALS:  Blood pressure 99/60, pulse (!) 101, temperature 97.8 F (36.6 C), temperature source Oral, resp. rate 20, height 5\' 5"  (1.651 m), weight 108.9 kg (240 lb), SpO2 94 %.  PHYSICAL EXAMINATION:   Physical Exam  GENERAL:  57 y.o.-year-old patient lying in bed in no acute distress.  EYES: Pupils equal, round, reactive to light and accommodation. No scleral icterus. Extraocular muscles intact.  HEENT: Head atraumatic, normocephalic. Oropharynx and nasopharynx clear.  NECK:  Supple, no jugular venous distention. No thyroid enlargement, no tenderness.  LUNGS: Normal breath sounds bilaterally, no wheezing, rales, rhonchi. No use of accessory muscles of respiration.  CARDIOVASCULAR: S1, S2 normal. No murmurs, rubs, or gallops.  ABDOMEN:  Soft, Tender in the RLQ, no rebound, rigidity, nondistended. Bowel sounds present. No organomegaly or mass. JP drain on the RLQ with some bloody drainage noted.  EXTREMITIES: No cyanosis, clubbing or edema b/l.    NEUROLOGIC: Cranial nerves II through XII are intact. No focal Motor or sensory deficits b/l.   PSYCHIATRIC: The patient is alert and oriented x 3.  SKIN: No obvious rash, lesion, or ulcer.    LABORATORY PANEL:   CBC Recent Labs  Lab 12/02/17 1245  WBC 20.9*  HGB 14.1  HCT 42.0  PLT 378   ------------------------------------------------------------------------------------------------------------------  Chemistries  Recent Labs  Lab 11/30/17 0427 12/01/17 0640 12/02/17 1245  NA 135 137 133*  K 3.3* 3.8 3.7  CL 95* 101 93*  CO2 29 28 29   GLUCOSE 89 140* 170*  BUN 24* 16 18  CREATININE 1.25* 0.96 1.74*  CALCIUM 9.0 8.9 9.1  MG 2.4 2.4  --   AST 22  --   --   ALT 25  --   --   ALKPHOS 91  --   --   BILITOT 0.6  --   --    ------------------------------------------------------------------------------------------------------------------  Cardiac Enzymes No results for input(s): TROPONINI in the last 168 hours. ------------------------------------------------------------------------------------------------------------------  RADIOLOGY:  No results found.   ASSESSMENT AND PLAN:   57 year old female with past medical history of essential hypertension, rheumatoid arthritis, COPD, diabetes, previous history of DVT who presents to the hospital due to abdominal pain with CT scan findings suggestive of a periappendiceal abscess.  1.  Periappendiceal abscess-this is the cause of patient abdominal pain.  Patient is status post IR guided drainage placement POD # 3.  Patient has  a JP drain placed which has some bloody drainage. -Continue empiric IV antibiotics with Zosyn.   - Culture from the abscess drainages polymicrobial.  Continue pain control and further care as  per general surgery.  Patient is tolerating a regular diet well now.  2.  History of COPD-no acute exacerbation. -Continue albuterol nebulizers as needed, duo nebs as needed -Continue maintenance prednisone.  3.  Essential hypertension-continue lisinopril, metoprolol, chlorthalidone. -Patient's blood pressure is on the low side this morning and therefore most of his blood pressure meds were held.  4.  DM type II without complication-continue sliding scale insulin.    5.  DM neuropathy-continue gabapentin.  6. Hx of previous DVT - cont. Coumadin.   -INR subtherapeutic and continue dosing as per pharmacy.  All the records are reviewed and case discussed with Care Management/Social Worker. Management plans discussed with the patient, family and they are in agreement.  CODE STATUS: Full code  DVT Prophylaxis: Warfarin  TOTAL TIME TAKING CARE OF THIS PATIENT: 25 minutes.   POSSIBLE D/C IN 2-3 DAYS, DEPENDING ON CLINICAL CONDITION.   Henreitta Leber M.D on 12/03/2017 at 12:39 PM  Between 7am to 6pm - Pager - [phone]  After 6pm go to [email or web address] - password [email or web address] [place] [facility]  Office  [phone]  CC: Primary care physician; Lavera Guise, MD

## 2017-12-03 NOTE — Consult Note (Signed)
ANTICOAGULATION CONSULT NOTE - Initial Consult  Pharmacy Consult for Warfarin Indication: secondary DVT (7 years ago)  No Known Allergies  Patient Measurements: Height: 5\' 5"  (165.1 cm) Weight: 240 lb (108.9 kg) IBW/kg (Calculated) : 57   Vital Signs: Temp: 97.8 F (36.6 C) (08/04 0451) Temp Source: Oral (08/04 0451) BP: 105/66 (08/04 0451) Pulse Rate: 91 (08/04 0451)  Labs: Recent Labs    12/01/17 0547 12/01/17 0640 12/02/17 1245 12/03/17 0601  HGB  --  13.4 14.1  --   HCT  --  38.2 42.0  --   PLT  --  336 378  --   LABPROT 17.6*  --  20.1* 20.4*  INR 1.46  --  1.73 1.76  CREATININE  --  0.96 1.74*  --     Estimated Creatinine Clearance: 43.8 mL/min (A) (by C-G formula based on SCr of 1.74 mg/dL (H)).   Medical History: Past Medical History:  Diagnosis Date  . Asthma   . Atopic dermatitis   . Constipation   . COPD (chronic obstructive pulmonary disease) ([place])   . Diabetes mellitus without complication ([place])   . DVT (deep venous thrombosis) ([place])   . GERD (gastroesophageal reflux disease)   . Hyperlipidemia   . Hypertension   . Rheumatoid arthritis (HCC)     Medications:  Scheduled:  . acyclovir  400 mg Oral BID  . chlorthalidone  25 mg Oral Daily  . fluticasone  1 spray Each Nare Daily  . gabapentin  700 mg Oral BID  . insulin aspart  0-20 Units Subcutaneous TID WC  . insulin aspart  0-5 Units Subcutaneous QHS  . metoprolol tartrate  50 mg Oral BID  . montelukast  10 mg Oral QHS  . predniSONE  10 mg Oral Q breakfast  . sodium chloride flush  5 mL Intracatheter Q8H  . umeclidinium bromide  1 puff Inhalation Daily  . warfarin  5 mg Oral q1800  . Warfarin - Pharmacist Dosing Inpatient   Does not apply q85     57 yo female who has a history of DVT (~7 years ago) s/p drain placement for ruptured appendix. Patient was taking warfarin 7.5 mg daily PTA.  7/30 Last warfarin dose prior to surgery 7/31  INR: 3.25  8/1 INR: 3.04 Vitamin K 1 mg  IV 8/2 INR: 1.46 Warfarin 5 mg  8/3 INR: 1.73 Warfarin 5 mg 8/4 INR: 1.76    Assessment & Plan:  Patient received vitamin K 1 mg iv 8/1. INR above goal prior to surgery. Also, patient is on Zosyn. Will order reduced dose of warfarin 5 mg daily and f/u AM INR.  12/02/2017  Continue warfarin 5 mg po daily.   12/03/2017 Continue warfarin 5 mg po daily  Laural Benes, PharmD, BCPS Clinical Pharmacist 12/03/2017 7:49 AM

## 2017-12-03 NOTE — Progress Notes (Signed)
CC: Ruptured appendicitis Subjective: This patient being treated conservatively for ruptured appendicitis with percutaneous drain.  She wants to advance her diet she is feeling better today.  Objective: Vital signs in last 24 hours: Temp:  [97.5 F (36.4 C)-99 F (37.2 C)] 97.8 F (36.6 C) (08/04 0451) Pulse Rate:  [82-101] 91 (08/04 0451) Resp:  [16-20] 20 (08/04 0451) BP: (79-110)/(45-67) 105/66 (08/04 0451) SpO2:  [93 %-100 %] 94 % (08/04 0451) Last BM Date: 12/02/17  Intake/Output from previous day: 08/03 0701 - 08/04 0700 In: 1110 [P.O.:500; IV Piggyback:600] Out: 1133 [Urine:1100; Drains:33] Intake/Output this shift: No intake/output data recorded.  Physical exam:  Vital signs reviewed and stable obese abdomen with drain in place serous fluid only minimally tender in the right lower quadrant.  Lab Results: CBC  Recent Labs    12/01/17 0640 12/02/17 1245  WBC 16.0* 20.9*  HGB 13.4 14.1  HCT 38.2 42.0  PLT 336 378   BMET Recent Labs    12/01/17 0640 12/02/17 1245  NA 137 133*  K 3.8 3.7  CL 101 93*  CO2 28 29  GLUCOSE 140* 170*  BUN 16 18  CREATININE 0.96 1.74*  CALCIUM 8.9 9.1   PT/INR Recent Labs    12/02/17 1245 12/03/17 0601  LABPROT 20.1* 20.4*  INR 1.73 1.76   ABG No results for input(s): PHART, HCO3 in the last 72 hours.  Invalid input(s): PCO2, PO2  Studies/Results: Ct Image Guided Drainage Percut Cath  Peritoneal Retroperit  Result Date: 12/01/2017 INDICATION: Ruptured appendicitis. Please perform CT-guided periappendiceal abscess drainage catheter for infection source control purposes. EXAM: CT-GUIDED PERIAPPENDICEAL ABSCESS DRAINAGE CATHETER PLACEMENT COMPARISON:  CT abdomen and pelvis - 11/29/2017 MEDICATIONS: The patient is currently admitted to the hospital and receiving intravenous antibiotics. The antibiotics were administered within an appropriate time frame prior to the initiation of the procedure. ANESTHESIA/SEDATION: Moderate  (conscious) sedation was employed during this procedure. A total of Versed 2 mg and Fentanyl 100 mcg was administered intravenously. Moderate Sedation Time: 18 minutes. The patient's level of consciousness and vital signs were monitored continuously by radiology nursing throughout the procedure under my direct supervision. CONTRAST:  None COMPLICATIONS: None immediate. PROCEDURE: Informed written consent was obtained from the patient after a discussion of the risks, benefits and alternatives to treatment. The patient was placed supine on the CT gantry and a pre procedural CT was performed re-demonstrating the known abscess/fluid collection within the right lower abdominal quadrant with dominant air in fluid containing collection measuring approximately 5.5 x 4.9 cm (image 29, series 2). The procedure was planned. A timeout was performed prior to the initiation of the procedure. The skin overlying the right inferior lateral abdomen was prepped and draped in the usual sterile fashion. The overlying soft tissues were anesthetized with 1% lidocaine with epinephrine. Appropriate trajectory was planned with the use of a 22 gauge spinal needle. An 18 gauge trocar needle was advanced into the abscess/fluid collection and a short Amplatz super stiff wire was coiled within the collection. Appropriate positioning was confirmed with a limited CT scan. The tract was serially dilated allowing placement of a 10 [place] all-purpose drainage catheter. Appropriate positioning was confirmed with a limited postprocedural CT scan. Approximately 50 ml of purulent fluid was aspirated. The tube was connected to a JP bulb and sutured in place. A dressing was placed. The patient tolerated the procedure well without immediate post procedural complication. IMPRESSION: Successful CT guided placement of a 10 French all purpose drain catheter into the  periappendiceal abscess with aspiration of 50 mL of purulent fluid. Samples were sent to the  laboratory as requested by the ordering clinical team. Electronically Signed   By: Sandi Mariscal M.D.   On: 12/01/2017 10:44    Anti-infectives: Anti-infectives (From admission, onward)   Start     Dose/Rate Route Frequency Ordered Stop   11/30/17 1400  piperacillin-tazobactam (ZOSYN) IVPB 3.375 g     3.375 g 12.5 mL/hr over 240 Minutes Intravenous Every 8 hours 11/30/17 1010     11/30/17 1000  acyclovir (ZOVIRAX) 200 MG capsule 400 mg     400 mg Oral 2 times daily 11/30/17 0959     11/30/17 0200  piperacillin-tazobactam (ZOSYN) IVPB 3.375 g     3.375 g 12.5 mL/hr over 240 Minutes Intravenous  Once 11/29/17 2136 11/30/17 0501   11/29/17 2200  acyclovir (ZOVIRAX) tablet 400 mg  Status:  Discontinued    Note to Pharmacy:  Take 1 tablet po TID for 10 days then take twice daily     400 mg Oral 2 times daily 11/29/17 2136 11/30/17 0959   11/29/17 1745  piperacillin-tazobactam (ZOSYN) IVPB 3.375 g     3.375 g 100 mL/hr over 30 Minutes Intravenous  Once 11/29/17 1732 11/29/17 1838      Assessment/Plan:  We will advance diet to regular diet today.  Possibly home tomorrow.  Florene Glen, MD, FACS  12/03/2017

## 2017-12-04 ENCOUNTER — Encounter: Payer: Self-pay | Admitting: Adult Health

## 2017-12-04 LAB — CBC WITH DIFFERENTIAL/PLATELET
Basophils Absolute: 0.1 10*3/uL (ref 0–0.1)
Basophils Relative: 0 %
Eosinophils Absolute: 0.2 10*3/uL (ref 0–0.7)
Eosinophils Relative: 1 %
HCT: 36.6 % (ref 35.0–47.0)
Hemoglobin: 12.8 g/dL (ref 12.0–16.0)
Lymphocytes Relative: 17 %
Lymphs Abs: 2.7 10*3/uL (ref 1.0–3.6)
MCH: 34.2 pg — ABNORMAL HIGH (ref 26.0–34.0)
MCHC: 34.9 g/dL (ref 32.0–36.0)
MCV: 98 fL (ref 80.0–100.0)
Monocytes Absolute: 1.5 10*3/uL — ABNORMAL HIGH (ref 0.2–0.9)
Monocytes Relative: 9 %
Neutro Abs: 11.7 10*3/uL — ABNORMAL HIGH (ref 1.4–6.5)
Neutrophils Relative %: 73 %
Platelets: 385 10*3/uL (ref 150–440)
RBC: 3.73 MIL/uL — ABNORMAL LOW (ref 3.80–5.20)
RDW: 14.8 % — ABNORMAL HIGH (ref 11.5–14.5)
WBC: 16.2 10*3/uL — ABNORMAL HIGH (ref 3.6–11.0)

## 2017-12-04 LAB — BASIC METABOLIC PANEL
Anion gap: 13 (ref 5–15)
BUN: 18 mg/dL (ref 6–20)
CO2: 30 mmol/L (ref 22–32)
Calcium: 9.4 mg/dL (ref 8.9–10.3)
Chloride: 95 mmol/L — ABNORMAL LOW (ref 98–111)
Creatinine, Ser: 1.23 mg/dL — ABNORMAL HIGH (ref 0.44–1.00)
GFR calc Af Amer: 55 mL/min — ABNORMAL LOW (ref >60)
GFR calc non Af Amer: 48 mL/min — ABNORMAL LOW (ref >60)
Glucose, Bld: 134 mg/dL — ABNORMAL HIGH (ref 70–99)
Potassium: 3.5 mmol/L (ref 3.5–5.1)
Sodium: 138 mmol/L (ref 135–145)

## 2017-12-04 LAB — GLUCOSE, CAPILLARY
Glucose-Capillary: 127 mg/dL — ABNORMAL HIGH (ref 70–99)
Glucose-Capillary: 186 mg/dL — ABNORMAL HIGH (ref 70–99)
Glucose-Capillary: 208 mg/dL — ABNORMAL HIGH (ref 70–99)
Glucose-Capillary: 208 mg/dL — ABNORMAL HIGH (ref 70–99)
Glucose-Capillary: 228 mg/dL — ABNORMAL HIGH (ref 70–99)

## 2017-12-04 LAB — PROTIME-INR
INR: 1.79
Prothrombin Time: 20.6 seconds — ABNORMAL HIGH (ref 11.4–15.2)

## 2017-12-04 MED ORDER — SIMETHICONE 80 MG PO CHEW
80.0000 mg | CHEWABLE_TABLET | Freq: Four times a day (QID) | ORAL | Status: DC | PRN
  Administered 2017-12-04: 80 mg via ORAL
  Filled 2017-12-04 (×2): qty 1

## 2017-12-04 MED ORDER — WARFARIN SODIUM 7.5 MG PO TABS
7.5000 mg | ORAL_TABLET | Freq: Every day | ORAL | Status: DC
  Administered 2017-12-04 – 2017-12-05 (×2): 7.5 mg via ORAL
  Filled 2017-12-04 (×3): qty 1

## 2017-12-04 MED ORDER — PREDNISONE 20 MG PO TABS
10.0000 mg | ORAL_TABLET | Freq: Once | ORAL | Status: AC
  Administered 2017-12-04: 10 mg via ORAL
  Filled 2017-12-04: qty 1

## 2017-12-04 MED ORDER — PREDNISONE 20 MG PO TABS
20.0000 mg | ORAL_TABLET | Freq: Every day | ORAL | Status: DC
  Administered 2017-12-05 – 2017-12-06 (×2): 20 mg via ORAL
  Filled 2017-12-04 (×2): qty 1

## 2017-12-04 MED ORDER — CHLORTHALIDONE 25 MG PO TABS
12.5000 mg | ORAL_TABLET | Freq: Every day | ORAL | Status: DC
  Administered 2017-12-05 – 2017-12-06 (×2): 12.5 mg via ORAL
  Filled 2017-12-04 (×2): qty 0.5

## 2017-12-04 NOTE — Care Management Important Message (Signed)
Copy of signed IM left with patient in room.  

## 2017-12-04 NOTE — Progress Notes (Signed)
BP low this am.  Dr Vianne Bulls said to hold chlorthalidone and give metoprolol if BP improved.  Patient had questions about meds.  She said she takes 12.5 mg chlorthalidone at home and we are giving 25mg  here.  Med list updated and dose changed to 12.5mg .  Patient said she takes prednisone 20mg  at home but we are giving her 10mg  here.  MD said to change dose to 20mg  and give 10mg  to make up the difference

## 2017-12-04 NOTE — Progress Notes (Signed)
Patient is having gas but says it feels like it is stuck in her abdomen.  Order received for gas x from Dr Dahlia Byes

## 2017-12-04 NOTE — Progress Notes (Signed)
CC: appendiceal abscess Subjective: Continues to have some pain, taking PO. NO fevers, creat went up  Objective: Vital signs in last 24 hours: Temp:  [98.5 F (36.9 C)] 98.5 F (36.9 C) (08/05 0508) Pulse Rate:  [80-98] 92 (08/05 0957) Resp:  [18-20] 18 (08/05 0508) BP: (100-111)/(59-76) 100/76 (08/05 0957) SpO2:  [95 %-100 %] 100 % (08/05 0508) Last BM Date: 12/02/17  Intake/Output from previous day: 08/04 0701 - 08/05 0700 In: 720 [P.O.:560; IV Piggyback:150] Out: 2108 [Urine:2100; Drains:8] Intake/Output this shift: Total I/O In: 67 [P.O.:360; IV Piggyback:100] Out: 405 [Urine:400; Drains:5]  Physical exam: NAD, alert  Abd: soft, mild diffuse tenderness, no peritonitis. Drain w cloudy fluid Ext: no edema and well perfused  Lab Results: CBC  Recent Labs    12/02/17 1245 12/04/17 0313  WBC 20.9* 16.2*  HGB 14.1 12.8  HCT 42.0 36.6  PLT 378 385   BMET Recent Labs    12/02/17 1245 12/04/17 0839  NA 133* 138  K 3.7 3.5  CL 93* 95*  CO2 29 30  GLUCOSE 170* 134*  BUN 18 18  CREATININE 1.74* 1.23*  CALCIUM 9.1 9.4   PT/INR Recent Labs    12/03/17 0601 12/04/17 0313  LABPROT 20.4* 20.6*  INR 1.76 1.79   ABG No results for input(s): PHART, HCO3 in the last 72 hours.  Invalid input(s): PCO2, PO2  Studies/Results: No results found.  Anti-infectives: Anti-infectives (From admission, onward)   Start     Dose/Rate Route Frequency Ordered Stop   11/30/17 1400  piperacillin-tazobactam (ZOSYN) IVPB 3.375 g     3.375 g 12.5 mL/hr over 240 Minutes Intravenous Every 8 hours 11/30/17 1010     11/30/17 1000  acyclovir (ZOVIRAX) 200 MG capsule 400 mg     400 mg Oral 2 times daily 11/30/17 0959     11/30/17 0200  piperacillin-tazobactam (ZOSYN) IVPB 3.375 g     3.375 g 12.5 mL/hr over 240 Minutes Intravenous  Once 11/29/17 2136 11/30/17 0501   11/29/17 2200  acyclovir (ZOVIRAX) tablet 400 mg  Status:  Discontinued    Note to Pharmacy:  Take 1 tablet po TID  for 10 days then take twice daily     400 mg Oral 2 times daily 11/29/17 2136 11/30/17 0959   11/29/17 1745  piperacillin-tazobactam (ZOSYN) IVPB 3.375 g     3.375 g 100 mL/hr over 30 Minutes Intravenous  Once 11/29/17 1732 11/29/17 1838      Assessment/Plan:  Appendiceal abscess.  S/post drain placement. She still having significant abdominal pain with an increasing creatinine.  We will continue antibiotic management for now and will repeat labs in the morning. Need for emergent surgical intervention if her pain does not resolve we may have to rescan her and reassess the need for surgical intervention.  Caroleen Hamman, MD, [facility]  12/04/2017

## 2017-12-04 NOTE — Progress Notes (Signed)
[facility] for Warfarin Indication: secondary DVT (7 years ago)  No Known Allergies  Patient Measurements: Height: 5\' 5"  (165.1 cm) Weight: 240 lb (108.9 kg) IBW/kg (Calculated) : 57   Vital Signs: Temp: 97.8 F (36.6 C) (08/04 0451) Temp Source: Oral (08/04 0451) BP: 105/66 (08/04 0451) Pulse Rate: 91 (08/04 0451)  Labs: RecentLabs(last2labs)  Recent Labs    12/01/17 0547 12/01/17 0640 12/02/17 1245 12/03/17 0601  HGB  --  13.4 14.1  --   HCT  --  38.2 42.0  --   PLT  --  336 378  --   LABPROT 17.6*  --  20.1* 20.4*  INR 1.46  --  1.73 1.76  CREATININE  --  0.96 1.74*  --       Estimated Creatinine Clearance: 43.8 mL/min (A) (by C-G formula based on SCr of 1.74 mg/dL (H)).   Medical History:     Past Medical History:  Diagnosis Date  . Asthma   . Atopic dermatitis   . Constipation   . COPD (chronic obstructive pulmonary disease) ([place])   . Diabetes mellitus without complication ([place])   . DVT (deep venous thrombosis) ([place])   . GERD (gastroesophageal reflux disease)   . Hyperlipidemia   . Hypertension   . Rheumatoid arthritis (HCC)     Medications:  Scheduled:  . acyclovir  400 mg Oral BID  . chlorthalidone  25 mg Oral Daily  . fluticasone  1 spray Each Nare Daily  . gabapentin  700 mg Oral BID  . insulin aspart  0-20 Units Subcutaneous TID WC  . insulin aspart  0-5 Units Subcutaneous QHS  . metoprolol tartrate  50 mg Oral BID  . montelukast  10 mg Oral QHS  . predniSONE  10 mg Oral Q breakfast  . sodium chloride flush  5 mL Intracatheter Q8H  . umeclidinium bromide  1 puff Inhalation Daily  . warfarin  5 mg Oral q1800  . Warfarin - Pharmacist Dosing Inpatient   Does not apply q20     57 yo female who has a history of DVT (~7 years ago) s/p drain placement for ruptured appendix. Patient was taking warfarin 7.5 mg daily PTA.  7/30     Last warfarin dose prior to surgery 7/31      INR: 3.25          8/1       INR: 3.04         Vitamin K 1 mg IV 8/2       INR: 1.46         Warfarin 5 mg  8/3       INR: 1.73         Warfarin 5 mg 8/4       INR: 1.76         Warfarin 5 mg 8/5  INR: 1.79   Assessment & Plan:  Patient is currently on Zosyn (day 5). Warfarin was held for surgery and restarted 8/2 at reduced dose of 5 mg for interaction with ABX. Will give 7.5 mg tonight and recheck INR w/ morning labs.   Tawnya Crook, PharmD Pharmacy Resident  12/04/2017 10:57 AM

## 2017-12-05 ENCOUNTER — Inpatient Hospital Stay: Payer: Medicare Other

## 2017-12-05 ENCOUNTER — Encounter: Payer: Self-pay | Admitting: Adult Health

## 2017-12-05 DIAGNOSIS — K3580 Unspecified acute appendicitis: Secondary | ICD-10-CM

## 2017-12-05 LAB — GLUCOSE, CAPILLARY
Glucose-Capillary: 104 mg/dL — ABNORMAL HIGH (ref 70–99)
Glucose-Capillary: 201 mg/dL — ABNORMAL HIGH (ref 70–99)
Glucose-Capillary: 209 mg/dL — ABNORMAL HIGH (ref 70–99)
Glucose-Capillary: 181 mg/dL — ABNORMAL HIGH (ref 70–99)

## 2017-12-05 LAB — CBC
HCT: 40.5 % (ref 35.0–47.0)
Hemoglobin: 13.3 g/dL (ref 12.0–16.0)
MCH: 32.8 pg (ref 26.0–34.0)
MCHC: 32.7 g/dL (ref 32.0–36.0)
MCV: 100 fL (ref 80.0–100.0)
Platelets: 430 10*3/uL (ref 150–440)
RBC: 4.05 MIL/uL (ref 3.80–5.20)
RDW: 14.7 % — ABNORMAL HIGH (ref 11.5–14.5)
WBC: 16.9 10*3/uL — ABNORMAL HIGH (ref 3.6–11.0)

## 2017-12-05 LAB — PROTIME-INR
INR: 1.8
Prothrombin Time: 20.7 seconds — ABNORMAL HIGH (ref 11.4–15.2)

## 2017-12-05 LAB — BASIC METABOLIC PANEL
Anion gap: 11 (ref 5–15)
BUN: 19 mg/dL (ref 6–20)
CO2: 32 mmol/L (ref 22–32)
Calcium: 9.4 mg/dL (ref 8.9–10.3)
Chloride: 96 mmol/L — ABNORMAL LOW (ref 98–111)
Creatinine, Ser: 1.26 mg/dL — ABNORMAL HIGH (ref 0.44–1.00)
GFR calc Af Amer: 54 mL/min — ABNORMAL LOW (ref >60)
GFR calc non Af Amer: 46 mL/min — ABNORMAL LOW (ref >60)
Glucose, Bld: 141 mg/dL — ABNORMAL HIGH (ref 70–99)
Potassium: 3.5 mmol/L (ref 3.5–5.1)
Sodium: 139 mmol/L (ref 135–145)

## 2017-12-05 MED ORDER — ACETAMINOPHEN 325 MG PO TABS: 650.0000 mg | ORAL_TABLET | Freq: Four times a day (QID) | ORAL | Status: DC | PRN

## 2017-12-05 MED ORDER — IOPAMIDOL (ISOVUE-300) INJECTION 61%
15.0000 mL | INTRAVENOUS | Status: AC
  Administered 2017-12-05 (×2): 15 mL via ORAL

## 2017-12-05 MED ORDER — FAMOTIDINE 20 MG PO TABS
20.0000 mg | ORAL_TABLET | Freq: Two times a day (BID) | ORAL | Status: DC
  Administered 2017-12-05 – 2017-12-06 (×2): 20 mg via ORAL
  Filled 2017-12-05 (×2): qty 1

## 2017-12-05 MED ORDER — IOHEXOL 300 MG/ML  SOLN
100.0000 mL | Freq: Once | INTRAMUSCULAR | Status: AC | PRN
  Administered 2017-12-05: 100 mL via INTRAVENOUS

## 2017-12-05 NOTE — Progress Notes (Signed)
PHARMACIST - PHYSICIAN COMMUNICATION  DR:   Dahlia Byes  CONCERNING: IV to Oral Route Change Policy  RECOMMENDATION: This patient is receiving famotidine by the intravenous route.  Based on criteria approved by the Pharmacy and Therapeutics Committee, the intravenous medication(s) is/are being converted to the equivalent oral dose form(s).   DESCRIPTION: These criteria include:  The patient is eating (either orally or via tube) and/or has been taking other orally administered medications for a least 24 hours  The patient has no evidence of active gastrointestinal bleeding or impaired GI absorption (gastrectomy, short bowel, patient on TNA or NPO).  If you have questions about this conversion, please contact the Pharmacy Department  []   [phone] )  [facility] [x]   [phone] )  [facility] []   [phone] )  [facility] []   [phone] )  [facility] []   [phone] )  [place], PharmD Pharmacy Resident  12/05/2017 1:03 PM

## 2017-12-05 NOTE — Progress Notes (Signed)
[facility] at [place] NAME: Brenda Santana    MR#:  [id number]  DATE OF BIRTH:  Oct 16, 1960  SUBJECTIVE:   Here due to abdominal pain noted to have a periappendiceal abscess.  Patient is status post IR guided drainage of the abscess yesterday.   Patient still having some significant abdominal pain, reports she had a bowel movement yesterday surgery following and wheezing resolved.  Denies any chest tightness   REVIEW OF SYSTEMS:    Review of Systems  Constitutional: Negative for chills and fever.  HENT: Negative for congestion and tinnitus.   Eyes: Negative for blurred vision and double vision.  Respiratory: Negative for cough, shortness of breath and wheezing.   Cardiovascular: Negative for chest pain, orthopnea and PND.  Gastrointestinal: Positive for abdominal pain. Negative for diarrhea, nausea and vomiting.  Genitourinary: Negative for dysuria and hematuria.  Neurological: Negative for dizziness, sensory change and focal weakness.  All other systems reviewed and are negative.   Nutrition: Full liquids Tolerating Diet: Yes Tolerating PT: yes ambulatory  DRUG ALLERGIES:  No Known Allergies  VITALS:  Blood pressure (!) 123/52, pulse 68, temperature 97.7 F (36.5 C), temperature source Oral, resp. rate 16, height 5\' 5"  (1.651 m), weight 108.9 kg (240 lb), SpO2 96 %.  PHYSICAL EXAMINATION:   Physical Exam  GENERAL:  57 y.o.-year-old patient lying in bed in no acute distress.  EYES: Pupils equal, round, reactive to light and accommodation. No scleral icterus. Extraocular muscles intact.  HEENT: Head atraumatic, normocephalic. Oropharynx and nasopharynx clear.  NECK:  Supple, no jugular venous distention. No thyroid enlargement, no tenderness.  LUNGS: Normal breath sounds bilaterally, no wheezing, rales, rhonchi. No use of accessory muscles of respiration.  CARDIOVASCULAR: S1, S2 normal. No murmurs, rubs, or gallops.  ABDOMEN: Soft,  Tender in the RLQ, no rebound, rigidity, nondistended. Bowel sounds present. No organomegaly or mass. JP drain on the RLQ with some bloody drainage noted.  EXTREMITIES: No cyanosis, clubbing or edema b/l.    NEUROLOGIC: Cranial nerves II through XII are intact. No focal Motor or sensory deficits b/l.   PSYCHIATRIC: The patient is alert and oriented x 3.  SKIN: No obvious rash, lesion, or ulcer.    LABORATORY PANEL:   CBC Recent Labs  Lab 12/05/17 0334  WBC 16.9*  HGB 13.3  HCT 40.5  PLT 430   ------------------------------------------------------------------------------------------------------------------  Chemistries  Recent Labs  Lab 11/30/17 0427 12/01/17 0640  12/05/17 0334  NA 135 137   < > 139  K 3.3* 3.8   < > 3.5  CL 95* 101   < > 96*  CO2 29 28   < > 32  GLUCOSE 89 140*   < > 141*  BUN 24* 16   < > 19  CREATININE 1.25* 0.96   < > 1.26*  CALCIUM 9.0 8.9   < > 9.4  MG 2.4 2.4  --   --   AST 22  --   --   --   ALT 25  --   --   --   ALKPHOS 91  --   --   --   BILITOT 0.6  --   --   --    < > = values in this interval not displayed.   ------------------------------------------------------------------------------------------------------------------  Cardiac Enzymes No results for input(s): TROPONINI in the last 168 hours. ------------------------------------------------------------------------------------------------------------------  RADIOLOGY:  Ct Abdomen Pelvis W Contrast  Result Date: 12/05/2017 CLINICAL DATA:  57 year old female  with a history of appendicitis, with associated abscess, and prior drain placement 12/01/2017 EXAM: CT ABDOMEN AND PELVIS WITH CONTRAST TECHNIQUE: Multidetector CT imaging of the abdomen and pelvis was performed using the standard protocol following bolus administration of intravenous contrast. CONTRAST:  123mL OMNIPAQUE IOHEXOL 300 MG/ML SOLN, <See Chart> ISOVUE-300 IOPAMIDOL (ISOVUE-300) INJECTION 61% COMPARISON:  11/29/2017,  12/01/2017 FINDINGS: Lower chest: No acute finding of the lower chest Hepatobiliary: Coarse calcifications of liver parenchyma. Cranial caudal span of the right liver measures 19 cm. Unremarkable appearance of the gallbladder. Pancreas: Unremarkable pancreas Spleen: Unremarkable spleen Adrenals/Urinary Tract: Unremarkable adrenal glands. Unremarkable appearance of the right kidney with no hydronephrosis or nephrolithiasis. Unremarkable course of the right ureter. Left kidney with no hydronephrosis or nephrolithiasis. Unremarkable course of the left ureter. Bosniak 1 cyst of the medial left kidney. Stomach/Bowel: Unremarkable stomach. Unremarkable small bowel. No transition point. Pigtail drainage catheter remains in the right lower quadrant adjacent to the cecum and the appendix. Mild inflammation of the visualized appendix. No calcified appendicoliths identified. There is resolution of the prior gas and fluid collection with mild edema/stranding. Colonic diverticular disease. Vascular/Lymphatic: Mild atherosclerotic changes. No aneurysm. Bilateral iliac arteries and proximal femoral arteries patent. Unremarkable venous anatomy. No adenopathy. Reproductive: Fibroid uterus. Other: Fat containing umbilical hernia. Mild inflammation within the right abdominal wall adjacent to the catheter. Small nodules in the anterior pannus, likely injection granulomas. Musculoskeletal: No acute displaced fracture. Mild degenerative changes of the visualized thoracolumbar spine. No bony canal narrowing. IMPRESSION: Drainage catheter remains unchanged in position in the right lower quadrant adjacent to the cecum and appendix. There is no evidence of residual fluid collection, with mild persisting inflammation of the local fat/soft tissues. No appendicoliths identified. Diverticular disease without evidence of acute diverticulitis. Hepatomegaly. Fibroid uterus. Electronically Signed   By: Corrie Mckusick D.O.   On: 12/05/2017 11:11      ASSESSMENT AND PLAN:   57 year old female with past medical history of essential hypertension, rheumatoid arthritis, COPD, diabetes, previous history of DVT who presents to the hospital due to abdominal pain with CT scan findings suggestive of a periappendiceal abscess.  1.  Periappendiceal abscess-this is the cause of patient abdominal pain.  Patient is status post IR guided drainage placement  Patient has a JP drain placed which has some bloody drainage. -Repeat CT scan with mild persistent inflammatory changes  -Continue empiric IV antibiotics with Zosyn  as white count is increasing - Culture from the abscess drainages polymicrobial.  Continue pain control and further care as per general surgery.  Patient is tolerating a regular diet well now.  2.  History of COPD-no acute exacerbation. -Continue albuterol nebulizers as needed, duo nebs as needed -Continue maintenance prednisone. -oob,IS  3.  Essential hypertension-continue lisinopril, metoprolol, chlorthalidone. -Patient's blood pressure is on the low side this morning and therefore most of his blood pressure meds were held.  4.  DM type II without complication-continue sliding scale insulin.    5.  DM neuropathy-continue gabapentin.  6. Hx of previous DVT - cont. Coumadin.  INR 1.80 -INR subtherapeutic and continue dosing as per pharmacy.    All the records are reviewed and case discussed with Care Management/Social Worker. Management plans discussed with the patient, family and they are in agreement.  CODE STATUS: Full code  DVT Prophylaxis: Warfarin  TOTAL TIME TAKING CARE OF THIS PATIENT: 25 minutes.   POSSIBLE D/C IN 2-3 DAYS, DEPENDING ON CLINICAL CONDITION.   Nicholes Mango M.D on 12/05/2017 at 3:45 PM  Between 7am to 6pm - Pager - [phone]  After 6pm go to [email or web address] - password [email or web address] [facility]  Office  [phone]  CC: Primary care physician; Lavera Guise,  MD

## 2017-12-05 NOTE — Patient Instructions (Signed)

## 2017-12-05 NOTE — Progress Notes (Signed)
[facility] for warfarin Indication: secondary DVT (7 years ago)  No Known Allergies  Patient Measurements: Height: 5\' 5"  (165.1 cm) Weight: 240 lb (108.9 kg) IBW/kg (Calculated) : 57  Vital Signs: Temp: 97.9 F (36.6 C) (08/06 0443) Temp Source: Oral (08/06 0443) BP: 125/61 (08/06 0443) Pulse Rate: 57 (08/06 0443)  Labs: Recent Labs    12/02/17 1245 12/03/17 0601 12/04/17 0313 12/04/17 0839 12/05/17 0334  HGB 14.1  --  12.8  --  13.3  HCT 42.0  --  36.6  --  40.5  PLT 378  --  385  --  430  LABPROT 20.1* 20.4* 20.6*  --  20.7*  INR 1.73 1.76 1.79  --  1.80  CREATININE 1.74*  --   --  1.23* 1.26*    Estimated Creatinine Clearance: 60.5 mL/min (A) (by C-G formula based on SCr of 1.26 mg/dL (H)).  Assessment: 57 yo female who has a history of DVT (~7 years ago) s/p drain placement for ruptured appendix. Patient was taking warfarin 7.5 mg daily PTA.  7/30Last warfarin dose prior to surgery 7/31 INR: 3.25 8/1INR: 3.04Vitamin K 1 mg IV 8/2INR: 1.46Warfarin 5 mg  8/3INR: 1.73Warfarin 5 mg 8/4INR: 1.76Warfarin 5 mg 8/5       INR: 1.79  Warfarin 7.5 mg 8/6  INR: 1.8    Goal of Therapy:  INR 2-3 Monitor platelets by anticoagulation protocol: Yes   Plan:  INR 1.8 today subtherapeutic. Will continue with 7.5 mg tonight and recheck INR with morning labs.  Tawnya Crook, PharmD Pharmacy Resident  12/05/2017 9:30 AM

## 2017-12-05 NOTE — Progress Notes (Signed)
[facility] at [place] NAME: Brenda Santana    MR#:  [id number]  DATE OF BIRTH:  07-16-1960  SUBJECTIVE:   Here due to abdominal pain noted to have a periappendiceal abscess.  Patient is status post IR guided drainage of the abscess yesterday.   Patient still having some significant abdominal pain, surgery following and wheezing but denies any chest tightness patient does have an elevated white cell count but is afebrile.  REVIEW OF SYSTEMS:    Review of Systems  Constitutional: Negative for chills and fever.  HENT: Negative for congestion and tinnitus.   Eyes: Negative for blurred vision and double vision.  Respiratory: Negative for cough, shortness of breath and wheezing.   Cardiovascular: Negative for chest pain, orthopnea and PND.  Gastrointestinal: Positive for abdominal pain. Negative for diarrhea, nausea and vomiting.  Genitourinary: Negative for dysuria and hematuria.  Neurological: Negative for dizziness, sensory change and focal weakness.  All other systems reviewed and are negative.   Nutrition: Full liquids Tolerating Diet: Yes Tolerating PT: yes ambulatory  DRUG ALLERGIES:  No Known Allergies  VITALS:  Blood pressure (!) 123/52, pulse 68, temperature 97.7 F (36.5 C), temperature source Oral, resp. rate 16, height 5\' 5"  (1.651 m), weight 108.9 kg (240 lb), SpO2 96 %.  PHYSICAL EXAMINATION:   Physical Exam  GENERAL:  57 y.o.-year-old patient lying in bed in no acute distress.  EYES: Pupils equal, round, reactive to light and accommodation. No scleral icterus. Extraocular muscles intact.  HEENT: Head atraumatic, normocephalic. Oropharynx and nasopharynx clear.  NECK:  Supple, no jugular venous distention. No thyroid enlargement, no tenderness.  LUNGS: Normal breath sounds bilaterally, no wheezing, rales, rhonchi. No use of accessory muscles of respiration.  CARDIOVASCULAR: S1, S2 normal. No murmurs, rubs, or gallops.   ABDOMEN: Soft, Tender in the RLQ, no rebound, rigidity, nondistended. Bowel sounds present. No organomegaly or mass. JP drain on the RLQ with some bloody drainage noted.  EXTREMITIES: No cyanosis, clubbing or edema b/l.    NEUROLOGIC: Cranial nerves II through XII are intact. No focal Motor or sensory deficits b/l.   PSYCHIATRIC: The patient is alert and oriented x 3.  SKIN: No obvious rash, lesion, or ulcer.    LABORATORY PANEL:   CBC Recent Labs  Lab 12/05/17 0334  WBC 16.9*  HGB 13.3  HCT 40.5  PLT 430   ------------------------------------------------------------------------------------------------------------------  Chemistries  Recent Labs  Lab 11/30/17 0427 12/01/17 0640  12/05/17 0334  NA 135 137   < > 139  K 3.3* 3.8   < > 3.5  CL 95* 101   < > 96*  CO2 29 28   < > 32  GLUCOSE 89 140*   < > 141*  BUN 24* 16   < > 19  CREATININE 1.25* 0.96   < > 1.26*  CALCIUM 9.0 8.9   < > 9.4  MG 2.4 2.4  --   --   AST 22  --   --   --   ALT 25  --   --   --   ALKPHOS 91  --   --   --   BILITOT 0.6  --   --   --    < > = values in this interval not displayed.   ------------------------------------------------------------------------------------------------------------------  Cardiac Enzymes No results for input(s): TROPONINI in the last 168 hours. ------------------------------------------------------------------------------------------------------------------  RADIOLOGY:  Ct Abdomen Pelvis W Contrast  Result Date: 12/05/2017 CLINICAL DATA:  57 year old female with a history of appendicitis, with associated abscess, and prior drain placement 12/01/2017 EXAM: CT ABDOMEN AND PELVIS WITH CONTRAST TECHNIQUE: Multidetector CT imaging of the abdomen and pelvis was performed using the standard protocol following bolus administration of intravenous contrast. CONTRAST:  155mL OMNIPAQUE IOHEXOL 300 MG/ML SOLN, <See Chart> ISOVUE-300 IOPAMIDOL (ISOVUE-300) INJECTION 61% COMPARISON:   11/29/2017, 12/01/2017 FINDINGS: Lower chest: No acute finding of the lower chest Hepatobiliary: Coarse calcifications of liver parenchyma. Cranial caudal span of the right liver measures 19 cm. Unremarkable appearance of the gallbladder. Pancreas: Unremarkable pancreas Spleen: Unremarkable spleen Adrenals/Urinary Tract: Unremarkable adrenal glands. Unremarkable appearance of the right kidney with no hydronephrosis or nephrolithiasis. Unremarkable course of the right ureter. Left kidney with no hydronephrosis or nephrolithiasis. Unremarkable course of the left ureter. Bosniak 1 cyst of the medial left kidney. Stomach/Bowel: Unremarkable stomach. Unremarkable small bowel. No transition point. Pigtail drainage catheter remains in the right lower quadrant adjacent to the cecum and the appendix. Mild inflammation of the visualized appendix. No calcified appendicoliths identified. There is resolution of the prior gas and fluid collection with mild edema/stranding. Colonic diverticular disease. Vascular/Lymphatic: Mild atherosclerotic changes. No aneurysm. Bilateral iliac arteries and proximal femoral arteries patent. Unremarkable venous anatomy. No adenopathy. Reproductive: Fibroid uterus. Other: Fat containing umbilical hernia. Mild inflammation within the right abdominal wall adjacent to the catheter. Small nodules in the anterior pannus, likely injection granulomas. Musculoskeletal: No acute displaced fracture. Mild degenerative changes of the visualized thoracolumbar spine. No bony canal narrowing. IMPRESSION: Drainage catheter remains unchanged in position in the right lower quadrant adjacent to the cecum and appendix. There is no evidence of residual fluid collection, with mild persisting inflammation of the local fat/soft tissues. No appendicoliths identified. Diverticular disease without evidence of acute diverticulitis. Hepatomegaly. Fibroid uterus. Electronically Signed   By: Corrie Mckusick D.O.   On: 12/05/2017  11:11     ASSESSMENT AND PLAN:   57 year old female with past medical history of essential hypertension, rheumatoid arthritis, COPD, diabetes, previous history of DVT who presents to the hospital due to abdominal pain with CT scan findings suggestive of a periappendiceal abscess.  1.  Periappendiceal abscess-this is the cause of patient abdominal pain.  Patient is status post IR guided drainage placement POD # 4  Patient has a JP drain placed which has some bloody drainage. -Continue empiric IV antibiotics with Zosyn.   - Culture from the abscess drainages polymicrobial.  Continue pain control and further care as per general surgery.  Patient is tolerating a regular diet well now.  2.  History of COPD-no acute exacerbation. -Minimal diffuse wheezing is present will provide nebulizer treatment and monitor patient -Continue albuterol nebulizers as needed, duo nebs as needed -Continue maintenance prednisone.  3.  Essential hypertension-continue lisinopril, metoprolol, chlorthalidone. -Patient's blood pressure is on the low side this morning and therefore most of his blood pressure meds were held.  4.  DM type II without complication-continue sliding scale insulin.    5.  DM neuropathy-continue gabapentin.  6. Hx of previous DVT - cont. Coumadin.   -INR subtherapeutic and continue dosing as per pharmacy.  All the records are reviewed and case discussed with Care Management/Social Worker. Management plans discussed with the patient, family and they are in agreement.  CODE STATUS: Full code  DVT Prophylaxis: Warfarin  TOTAL TIME TAKING CARE OF THIS PATIENT: 25 minutes.   POSSIBLE D/C IN 2-3 DAYS, DEPENDING ON CLINICAL CONDITION.   Nicholes Mango M.D on 12/05/2017 at 3:44 PM  Between 7am to 6pm - Pager - [phone]  After 6pm go to [email or web address] - password [email or web address] [facility]  Office  [phone]  CC: Primary care physician; Lavera Guise, MD

## 2017-12-05 NOTE — Progress Notes (Signed)
CC: Appendiceal abscess Subjective: Persistent pain, wbc up. AVSS  Objective: Vital signs in last 24 hours: Temp:  [97.9 F (36.6 C)-98.6 F (37 C)] 97.9 F (36.6 C) (08/06 0443) Pulse Rate:  [57-105] 57 (08/06 0443) Resp:  [16-20] 20 (08/06 0443) BP: (100-125)/(61-76) 125/61 (08/06 0443) SpO2:  [95 %-99 %] 99 % (08/06 0443) Last BM Date: 12/02/17  Intake/Output from previous day: 08/05 0701 - 08/06 0700 In: 1218.7 [P.O.:1016; IV Piggyback:192.7] Out: 2517 [Urine:2500; Drains:17] Intake/Output this shift: No intake/output data recorded.  Physical exam:  NAD , awake and alert Abd: soft but diffuse tenderness, no peritonitis, Drain in place w some purulent fluid Ext: no edema and well perfused  Lab Results: CBC  Recent Labs    12/04/17 0313 12/05/17 0334  WBC 16.2* 16.9*  HGB 12.8 13.3  HCT 36.6 40.5  PLT 385 430   BMET Recent Labs    12/04/17 0839 12/05/17 0334  NA 138 139  K 3.5 3.5  CL 95* 96*  CO2 30 32  GLUCOSE 134* 141*  BUN 18 19  CREATININE 1.23* 1.26*  CALCIUM 9.4 9.4   PT/INR Recent Labs    12/04/17 0313 12/05/17 0334  LABPROT 20.6* 20.7*  INR 1.79 1.80   ABG No results for input(s): PHART, HCO3 in the last 72 hours.  Invalid input(s): PCO2, PO2  Studies/Results: No results found.  Anti-infectives: Anti-infectives (From admission, onward)   Start     Dose/Rate Route Frequency Ordered Stop   11/30/17 1400  piperacillin-tazobactam (ZOSYN) IVPB 3.375 g     3.375 g 12.5 mL/hr over 240 Minutes Intravenous Every 8 hours 11/30/17 1010     11/30/17 1000  acyclovir (ZOVIRAX) 200 MG capsule 400 mg     400 mg Oral 2 times daily 11/30/17 0959     11/30/17 0200  piperacillin-tazobactam (ZOSYN) IVPB 3.375 g     3.375 g 12.5 mL/hr over 240 Minutes Intravenous  Once 11/29/17 2136 11/30/17 0501   11/29/17 2200  acyclovir (ZOVIRAX) tablet 400 mg  Status:  Discontinued    Note to Pharmacy:  Take 1 tablet po TID for 10 days then take twice daily      400 mg Oral 2 times daily 11/29/17 2136 11/30/17 0959   11/29/17 1745  piperacillin-tazobactam (ZOSYN) IVPB 3.375 g     3.375 g 100 mL/hr over 30 Minutes Intravenous  Once 11/29/17 1732 11/29/17 1838      Assessment/Plan:  Appendiceal abscess status post drain placement.  Persistent abdominal pain and increasing white count.  We will repeat a CT scan of the abdomen and pelvis and continue antibiotic therapy for now, No need for any emergent surgical intervention at this time  Caroleen Hamman, MD, FACS  12/05/2017

## 2017-12-06 ENCOUNTER — Other Ambulatory Visit: Payer: Self-pay

## 2017-12-06 LAB — BASIC METABOLIC PANEL
Anion gap: 10 (ref 5–15)
BUN: 19 mg/dL (ref 6–20)
CO2: 31 mmol/L (ref 22–32)
Calcium: 9.1 mg/dL (ref 8.9–10.3)
Chloride: 97 mmol/L — ABNORMAL LOW (ref 98–111)
Creatinine, Ser: 1.04 mg/dL — ABNORMAL HIGH (ref 0.44–1.00)
GFR calc Af Amer: >60 mL/min (ref >60)
GFR calc non Af Amer: 58 mL/min — ABNORMAL LOW (ref >60)
Glucose, Bld: 130 mg/dL — ABNORMAL HIGH (ref 70–99)
Potassium: 3.2 mmol/L — ABNORMAL LOW (ref 3.5–5.1)
Sodium: 138 mmol/L (ref 135–145)

## 2017-12-06 LAB — CBC
HCT: 39.3 % (ref 35.0–47.0)
Hemoglobin: 12.8 g/dL (ref 12.0–16.0)
MCH: 32.3 pg (ref 26.0–34.0)
MCHC: 32.6 g/dL (ref 32.0–36.0)
MCV: 99 fL (ref 80.0–100.0)
Platelets: 448 10*3/uL — ABNORMAL HIGH (ref 150–440)
RBC: 3.97 MIL/uL (ref 3.80–5.20)
RDW: 14.6 % — ABNORMAL HIGH (ref 11.5–14.5)
WBC: 13.6 10*3/uL — ABNORMAL HIGH (ref 3.6–11.0)

## 2017-12-06 LAB — GLUCOSE, CAPILLARY: Glucose-Capillary: 115 mg/dL — ABNORMAL HIGH (ref 70–99)

## 2017-12-06 LAB — PROTIME-INR
INR: 2.04
Prothrombin Time: 22.9 seconds — ABNORMAL HIGH (ref 11.4–15.2)

## 2017-12-06 MED ORDER — ONDANSETRON 4 MG PO TBDP: 4.0000 mg | ORAL_TABLET | Freq: Four times a day (QID) | ORAL | 0 refills | Status: DC | PRN

## 2017-12-06 MED ORDER — CIPROFLOXACIN HCL 500 MG PO TABS: 500.0000 mg | ORAL_TABLET | Freq: Two times a day (BID) | ORAL | 0 refills | Status: AC

## 2017-12-06 MED ORDER — PNEUMOCOCCAL VAC POLYVALENT 25 MCG/0.5ML IJ INJ: 0.5000 mL | INJECTION | INTRAMUSCULAR | Status: DC

## 2017-12-06 MED ORDER — PNEUMOCOCCAL VAC POLYVALENT 25 MCG/0.5ML IJ INJ
0.5000 mL | INJECTION | INTRAMUSCULAR | Status: AC
  Administered 2017-12-06: 0.5 mL via INTRAMUSCULAR
  Filled 2017-12-06: qty 0.5

## 2017-12-06 MED ORDER — HYDROCODONE-ACETAMINOPHEN 5-325 MG PO TABS: 1.0000 | ORAL_TABLET | Freq: Four times a day (QID) | ORAL | 0 refills | Status: DC | PRN

## 2017-12-06 MED ORDER — METRONIDAZOLE 500 MG PO TABS: 500.0000 mg | ORAL_TABLET | Freq: Three times a day (TID) | ORAL | 0 refills | Status: AC

## 2017-12-06 NOTE — Care Management Important Message (Signed)
Copy of signed IM left with patient in room.  

## 2017-12-06 NOTE — Progress Notes (Signed)
[facility] [place] NAME: Brenda Santana    MR#:  [id number]  DATE OF BIRTH:  1961/01/25  SUBJECTIVE:   Here due to abdominal pain noted to have a periappendiceal abscess.  Patient is status post IR guided drainage of the abscess yesterday.   Patient is feeling much better.  Surgery discharge her home reporting nausea and asking for nausea medicine to go home  REVIEW OF SYSTEMS:    Review of Systems  Constitutional: Negative for chills and fever.  HENT: Negative for congestion and tinnitus.   Eyes: Negative for blurred vision and double vision.  Respiratory: Negative for cough, shortness of breath and wheezing.   Cardiovascular: Negative for chest pain, orthopnea and PND.  Gastrointestinal: Positive for nausea. Negative for abdominal pain, diarrhea and vomiting.  Genitourinary: Negative for dysuria and hematuria.  Neurological: Negative for dizziness, sensory change and focal weakness.  All other systems reviewed and are negative.   Nutrition: Full liquids Tolerating Diet: Yes Tolerating PT: yes ambulatory  DRUG ALLERGIES:  No Known Allergies  VITALS:  Blood pressure (!) 148/74, pulse (!) 57, temperature 97.8 F (36.6 C), temperature source Oral, resp. rate 20, height 5\' 5"  (1.651 m), weight 108.9 kg (240 lb), SpO2 98 %.  PHYSICAL EXAMINATION:   Physical Exam  GENERAL:  57 y.o.-year-old patient lying in bed in no acute distress.  EYES: Pupils equal, round, reactive to light and accommodation. No scleral icterus. Extraocular muscles intact.  HEENT: Head atraumatic, normocephalic. Oropharynx and nasopharynx clear.  NECK:  Supple, no jugular venous distention. No thyroid enlargement, no tenderness.  LUNGS: Normal breath sounds bilaterally, no wheezing, rales, rhonchi. No use of accessory muscles of respiration.  CARDIOVASCULAR: S1, S2 normal. No murmurs, rubs, or gallops.  ABDOMEN: Soft,  tender minimally in the RLQ, no rebound,  rigidity, nondistended. Bowel sounds present. . JP drain on the RLQ   EXTREMITIES: No cyanosis, clubbing or edema b/l.    NEUROLOGIC: Cranial nerves II through XII are intact. No focal Motor or sensory deficits b/l.   PSYCHIATRIC: The patient is alert and oriented x 3.  SKIN: No obvious rash, lesion, or ulcer.    LABORATORY PANEL:   CBC Recent Labs  Lab 12/06/17 0423  WBC 13.6*  HGB 12.8  HCT 39.3  PLT 448*   ------------------------------------------------------------------------------------------------------------------  Chemistries  Recent Labs  Lab 11/30/17 0427 12/01/17 0640  12/06/17 0423  NA 135 137   < > 138  K 3.3* 3.8   < > 3.2*  CL 95* 101   < > 97*  CO2 29 28   < > 31  GLUCOSE 89 140*   < > 130*  BUN 24* 16   < > 19  CREATININE 1.25* 0.96   < > 1.04*  CALCIUM 9.0 8.9   < > 9.1  MG 2.4 2.4  --   --   AST 22  --   --   --   ALT 25  --   --   --   ALKPHOS 91  --   --   --   BILITOT 0.6  --   --   --    < > = values in this interval not displayed.   ------------------------------------------------------------------------------------------------------------------  Cardiac Enzymes No results for input(s): TROPONINI in the last 168 hours. ------------------------------------------------------------------------------------------------------------------  RADIOLOGY:  Ct Abdomen Pelvis W Contrast  Result Date: 12/05/2017 CLINICAL DATA:  57 year old female with a history of appendicitis, with associated abscess, and  prior drain placement 12/01/2017 EXAM: CT ABDOMEN AND PELVIS WITH CONTRAST TECHNIQUE: Multidetector CT imaging of the abdomen and pelvis was performed using the standard protocol following bolus administration of intravenous contrast. CONTRAST:  129mL OMNIPAQUE IOHEXOL 300 MG/ML SOLN, <See Chart> ISOVUE-300 IOPAMIDOL (ISOVUE-300) INJECTION 61% COMPARISON:  11/29/2017, 12/01/2017 FINDINGS: Lower chest: No acute finding of the lower chest Hepatobiliary:  Coarse calcifications of liver parenchyma. Cranial caudal span of the right liver measures 19 cm. Unremarkable appearance of the gallbladder. Pancreas: Unremarkable pancreas Spleen: Unremarkable spleen Adrenals/Urinary Tract: Unremarkable adrenal glands. Unremarkable appearance of the right kidney with no hydronephrosis or nephrolithiasis. Unremarkable course of the right ureter. Left kidney with no hydronephrosis or nephrolithiasis. Unremarkable course of the left ureter. Bosniak 1 cyst of the medial left kidney. Stomach/Bowel: Unremarkable stomach. Unremarkable small bowel. No transition point. Pigtail drainage catheter remains in the right lower quadrant adjacent to the cecum and the appendix. Mild inflammation of the visualized appendix. No calcified appendicoliths identified. There is resolution of the prior gas and fluid collection with mild edema/stranding. Colonic diverticular disease. Vascular/Lymphatic: Mild atherosclerotic changes. No aneurysm. Bilateral iliac arteries and proximal femoral arteries patent. Unremarkable venous anatomy. No adenopathy. Reproductive: Fibroid uterus. Other: Fat containing umbilical hernia. Mild inflammation within the right abdominal wall adjacent to the catheter. Small nodules in the anterior pannus, likely injection granulomas. Musculoskeletal: No acute displaced fracture. Mild degenerative changes of the visualized thoracolumbar spine. No bony canal narrowing. IMPRESSION: Drainage catheter remains unchanged in position in the right lower quadrant adjacent to the cecum and appendix. There is no evidence of residual fluid collection, with mild persisting inflammation of the local fat/soft tissues. No appendicoliths identified. Diverticular disease without evidence of acute diverticulitis. Hepatomegaly. Fibroid uterus. Electronically Signed   By: Corrie Mckusick D.O.   On: 12/05/2017 11:11     ASSESSMENT AND PLAN:   57 year old female with past medical history of essential  hypertension, rheumatoid arthritis, COPD, diabetes, previous history of DVT who presents to the hospital due to abdominal pain with CT scan findings suggestive of a periappendiceal abscess.  1.  Periappendiceal abscess-this is the cause of patient abdominal pain.  Patient is status post IR guided drainage placement   -Repeat CT scan with mild persistent inflammatory changes  -Continued empiric IV antibiotics with Zosyn discharging with the p.o. Flagyl and floxacin - Culture from the abscess drainages polymicrobial.  Continue pain control and further care as per general surgery.  Patient is tolerating a regular diet well now. -Zofran to go home to use as needed for nasal nausea  2.  History of COPD-no acute exacerbation. -Continue albuterol nebulizers as needed, duo nebs as needed -Continue maintenance prednisone. -oob,IS  3.  Essential hypertension-continue lisinopril, metoprolol, chlorthalidone. -Patient's blood pressure is on the low side this morning and therefore most of his blood pressure meds were held.  4.  DM type II without complication-continue sliding scale insulin.    5.  DM neuropathy-continue gabapentin.  6. Hx of previous DVT - cont. Coumadin.  INR 2.04 -Outpatient follow-up with primary care physician for PT/INR monitoring and Coumadin management    All the records are reviewed and case discussed with Care Management/Social Worker. Management plans discussed with the patient, family and they are in agreement.  CODE STATUS: Full code  DVT Prophylaxis: Warfarin  TOTAL TIME TAKING CARE OF THIS PATIENT: 25 minutes.   POSSIBLE D/C IN 2-3 DAYS, DEPENDING ON CLINICAL CONDITION.   Nicholes Mango M.D on 12/06/2017 at 11:02 AM  Between 7am to  6pm - Pager - [phone]  After 6pm go to [email or web address] - password [email or web address] [facility]  Office  [phone]  CC: Primary care physician; Lavera Guise, MD

## 2017-12-06 NOTE — Progress Notes (Signed)
[facility] for warfarin Indication: secondary DVT (7 years ago)  No Known Allergies  Patient Measurements: Height: 5\' 5"  (165.1 cm) Weight: 240 lb (108.9 kg) IBW/kg (Calculated) : 57   Vital Signs: Temp: 97.8 F (36.6 C) (08/07 0446) Temp Source: Oral (08/07 0446) BP: 148/74 (08/07 0446) Pulse Rate: 57 (08/07 0446)  Labs: Recent Labs    12/04/17 0313 12/04/17 0839 12/05/17 0334 12/06/17 0423  HGB 12.8  --  13.3 12.8  HCT 36.6  --  40.5 39.3  PLT 385  --  430 448*  LABPROT 20.6*  --  20.7* 22.9*  INR 1.79  --  1.80 2.04  CREATININE  --  1.23* 1.26* 1.04*    Estimated Creatinine Clearance: 73.3 mL/min (A) (by C-G formula based on SCr of 1.04 mg/dL (H)).  Assessment: 57 yo female who has a history of DVT (~7 years ago) s/p drain placement for ruptured appendix. Patient taking warfarin 7.5 mg daily PTA.  7/30Last warfarin dose prior to surgery 7/31 INR: 3.25 8/1INR: 3.04Vitamin K 1 mg IV 8/2INR: 1.46Warfarin 5 mg  8/3INR: 1.73Warfarin 5 mg 8/4INR: 1.76Warfarin 5 mg 8/5INR: 1.79         Warfarin 7.5 mg 8/6       INR: 1.8           Warfarin 7.5 mg 8/7       INR: 2.04     Goal of Therapy:  INR 2-3 Monitor platelets by anticoagulation protocol: Yes   Plan:  INR 2.04 today therapeutic. Will continue with 7.5 mg tonight and recheck INR with morning labs.  Tawnya Crook, PharmD Pharmacy Resident  12/06/2017 8:11 AM

## 2017-12-06 NOTE — Discharge Instructions (Addendum)
Surgical Drain Home Care °Surgical drains are used to remove extra fluid that normally builds up in a surgical wound after surgery. A surgical drain helps to heal a surgical wound. Different kinds of surgical drains include: °· Active drains. These drains use suction to pull drainage away from the surgical wound. Drainage flows through a tube to a container outside of the body. It is important to keep the bulb or the drainage container flat (compressed) at all times, except while you empty it. Flattening the bulb or container creates suction. The two most common types of active drains are bulb drains and Hemovac drains. °· Passive drains. These drains allow fluid to drain naturally, by gravity. Drainage flows through a tube to a bandage (dressing) or a container outside of the body. Passive drains do not need to be emptied. The most common type of passive drain is the Penrose drain. ° °A drain is placed during surgery. Immediately after surgery, drainage is usually bright red and a little thicker than water. The drainage may gradually turn yellow or pink and become thinner. It is likely that your health care provider will remove the drain when the drainage stops or when the amount decreases to 1-2 Tbsp (15-30 mL) during a 24-hour period. °How to care for your surgical drain °· Keep the skin around the drain dry and covered with a dressing at all times. °· Check your drain area every day for signs of infection. Check for: °? More redness, swelling, or pain. °? Pus or a bad smell. °? Cloudy drainage. °Follow instructions from your health care provider about how to take care of your drain and how to change your dressing. Change your dressing at least one time every day. Change it more often if needed to keep the dressing dry. Make sure you: °1. Gather your supplies, including: °? Tape. °? Germ-free cleaning solution (sterile saline). °? Split gauze drain sponge: 4 x 4 inches (10 x 10 cm). °? Gauze square: 4 x 4 inches  (10 x 10 cm). °2. Wash your hands with soap and water before you change your dressing. If soap and water are not available, use hand sanitizer. °3. Remove the old dressing. Avoid using scissors to do that. °4. Use sterile saline to clean your skin around the drain. °5. Place the tube through the slit in a drain sponge. Place the drain sponge so that it covers your wound. °6. Place the gauze square or another drain sponge on top of the drain sponge that is on the wound. Make sure the tube is between those layers. °7. Tape the dressing to your skin. °8. If you have an active bulb or Hemovac drain, tape the drainage tube to your skin 1-2 inches (2.5-5 cm) below the place where the tube enters your body. Taping keeps the tube from pulling on any stitches (sutures) that you have. °9. Wash your hands with soap and water. °10. Write down the color of your drainage and how often you change your dressing. ° °How to empty your active bulb or Hemovac drain °1. Make sure that you have a measuring cup that you can empty your drainage into. °2. Wash your hands with soap and water. If soap and water are not available, use hand sanitizer. °3. Gently move your fingers down the tube while squeezing very lightly. This is called stripping the tube. This clears any drainage, clots, or tissue from the tube. °? Do not pull on the tube. °? You may need to strip   the tube several times every day to keep the tube clear. °4. Open the bulb cap or the drain plug. Do not touch the inside of the cap or the bottom of the plug. °5. Empty all of the drainage into the measuring cup. °6. Compress the bulb or the container and replace the cap or the plug. To compress the bulb or the container, squeeze it firmly in the middle while you close the cap or plug the container. °7. Write down the amount of drainage that you have in each 24-hour period. If you have less than 2 Tbsp (30 mL) of drainage during 24 hours, contact your health care  provider. °8. Flush the drainage down the toilet. °9. Wash your hands with soap and water. °Contact a health care provider if: °· You have more redness, swelling, or pain around your drain area. °· The amount of drainage that you have is increasing instead of decreasing. °· You have pus or a bad smell coming from your drain area. °· You have a fever. °· You have drainage that is cloudy. °· There is a sudden stop or a sudden decrease in the amount of drainage that you have. °· Your tube falls out. °· Your active drain does not stay compressed after you empty it. °This information is not intended to replace advice given to you by your health care provider. Make sure you discuss any questions you have with your health care provider. °Document Released: 04/15/2000 Document Revised: 09/24/2015 Document Reviewed: 11/05/2014 °Elsevier Interactive Patient Education © 2018 [employer] ° °

## 2017-12-07 NOTE — Discharge Summary (Signed)
Patient ID: Brenda Santana MRN: [id number] DOB/AGE: [birth year] 57 y.o.  Admit date: 11/29/2017 Discharge date: 12/07/2017   Discharge Diagnoses:  Active Problems:   Appendicitis   Procedures: Percutaneous drainage of abscess by interventional radiology  Hospital Course: This is a 57 year old female came to the emergency room complaining of abdominal pain nausea vomiting CT scan and diagnosis appendicitis with periappendiceal abscess.  She was admitted to the hospital place an IV antibiotic and resuscitated. SHe does have a history of A fib on anticoagulation her INR arrival was about 3.  We stopped the Coumadin and gave her vitamin K.  Once his INR was reasonable interventional radiology placed a strain she did well to significant days to recover.  Was advanced from a clear liquid diet diet but she had persistent pain to drain placement.  Her white count also was the abdomen and pelvis showing resolution of the periappendiceal abscess no evidence of any free air or abdominal catastrophe.   SHe was also patient that was on chronic steroids and the hospitalist was consulted for medical management of multiple comorbidities. Her abdominal pain continued to improve as well as her white count.  At the time of discharge she was tolerating regular diet.  She was ambulating and having bowel movements.  Abdominal pain had significantly improved.  Her physical exam at the time of discharge showed a female no acute distress awake and alert.  Abdomen soft with minimal tenderness no peritonitis or rebound.  Extremities well-perfused.  Back on her Coumadin with therapeutic INR.  Condition of the patient at the time of discharge was stable.  She will complet antibiotic therapy and see [place] in the office in a couple weeks.  I do recommend interval appendectomy once the inflammatory response subsides   Consults: Hospitalist  Disposition:   Discharge Instructions    Call MD for:  difficulty breathing, headache  or visual disturbances   Complete by:  As directed    Call MD for:  extreme fatigue   Complete by:  As directed    Call MD for:  hives   Complete by:  As directed    Call MD for:  persistant dizziness or light-headedness   Complete by:  As directed    Call MD for:  persistant nausea and vomiting   Complete by:  As directed    Call MD for:  redness, tenderness, or signs of infection (pain, swelling, redness, odor or green/yellow discharge around incision site)   Complete by:  As directed    Call MD for:  severe uncontrolled pain   Complete by:  As directed    Call MD for:  temperature >100.4   Complete by:  As directed    Diet - low sodium heart healthy   Complete by:  As directed    Discharge instructions   Complete by:  As directed    Please teach about JP care, no flushing required   Discharge wound care:   Complete by:  As directed    Change drain gauze daily.   Increase activity slowly   Complete by:  As directed      Allergies as of 12/06/2017   No Known Allergies     Medication List    STOP taking these medications   pneumococcal 23 valent vaccine 25 MCG/0.5ML injection Commonly known as:  PNU-IMMUNE     TAKE these medications   acyclovir 400 MG tablet Commonly known as:  ZOVIRAX Take 1 tablet po TID for 10 days  then take twice daily What changed:    how much to take  how to take this  when to take this  additional instructions   acyclovir ointment 5 % Commonly known as:  ZOVIRAX Apply topically every 3 (three) hours.   albuterol 108 (90 Base) MCG/ACT inhaler Commonly known as:  PROVENTIL HFA;VENTOLIN HFA INHALE 2 PUFFS 4 TIMES A DAY AS NEEDED   allopurinol 300 MG tablet Commonly known as:  ZYLOPRIM TAKE ONE TAB AT NIGHT FOR GOUT   aspirin EC 81 MG tablet Take 81 mg by mouth daily.   azithromycin 250 MG tablet Commonly known as:  ZITHROMAX TAKE 1 TABLET BY MOUTH EVERY DAY   chlorthalidone 25 MG tablet Commonly known as:  HYGROTON Take 12.5  mg by mouth daily.   chlorthalidone 25 MG tablet Commonly known as:  HYGROTON TAKE 1 DAILY EVERY MORNING   ciprofloxacin 500 MG tablet Commonly known as:  CIPRO Take 1 tablet (500 mg total) by mouth 2 (two) times daily for 10 days.   colchicine 0.6 MG tablet Take 1 tablet (0.6 mg total) by mouth daily. Take 2 tablets at onset of pain, then may take 1 more 1 hour later if pain continues.   dicyclomine 10 MG capsule Commonly known as:  BENTYL Take 1 capsule (10 mg total) by mouth 4 (four) times daily -  before meals and at bedtime.   gabapentin 800 MG tablet Commonly known as:  NEURONTIN Take 800 mg by mouth 3 (three) times daily.   HYDROcodone-acetaminophen 5-325 MG tablet Commonly known as:  NORCO/VICODIN Take 1 tablet by mouth every 4 (four) hours as needed. What changed:  Another medication with the same name was added. Make sure you understand how and when to take each.   HYDROcodone-acetaminophen 5-325 MG tablet Commonly known as:  NORCO/VICODIN Take 1 tablet by mouth every 6 (six) hours as needed for moderate pain. What changed:  You were already taking a medication with the same name, and this prescription was added. Make sure you understand how and when to take each.   INCRUSE ELLIPTA 62.5 MCG/INH Aepb Generic drug:  umeclidinium bromide TAKE 1 PUFF BY MOUTH EVERY DAY   INVOKAMET 50-500 MG Tabs Generic drug:  Canagliflozin-metFORMIN HCl TAKE 1 TABLET BY MOUTH TWICE A DAY   ipratropium-albuterol 0.5-2.5 (3) MG/3ML Soln Commonly known as:  DUONEB Take 3 mLs by nebulization every 6 (six) hours as needed.   Iron 325 (65 Fe) MG Tabs Take 1 tablet by mouth daily.   lidocaine 5 % ointment Commonly known as:  XYLOCAINE APPLY A SMALL AMOUNT TO AFFECTED AREA 2-3 TIMES A DAY   LINZESS 145 MCG Caps capsule Generic drug:  linaclotide TAKE ONE CAPSULE BY MOUTH ONCE DAILY FOR CONSTIPATION   lisinopril 10 MG tablet Commonly known as:  PRINIVIL,ZESTRIL TAKE 1 TAB DAILY  IN MORNING   metoprolol tartrate 50 MG tablet Commonly known as:  LOPRESSOR TAKE 1 TABLET BY MOUTH TWICE A DAY   metroNIDAZOLE 500 MG tablet Commonly known as:  FLAGYL Take 1 tablet (500 mg total) by mouth 3 (three) times daily for 10 days.   montelukast 10 MG tablet Commonly known as:  SINGULAIR TAKE 1 TABLET BY MOUTH DAILY FOR ASTHMA What changed:  See the new instructions.   ondansetron 4 MG disintegrating tablet Commonly known as:  ZOFRAN-ODT Take 1 tablet (4 mg total) by mouth every 6 (six) hours as needed for nausea.   ondansetron 4 MG tablet Commonly known as:  ZOFRAN Take 1 tablet (4 mg total) by mouth every 8 (eight) hours as needed for nausea or vomiting.   phentermine 37.5 MG capsule Take 1 capsule (37.5 mg total) by mouth every morning.   predniSONE 5 MG tablet Commonly known as:  DELTASONE TAKE 2 TABLETS BY MOUTH EVERY DAY What changed:    how much to take  how to take this  when to take this   traMADol 50 MG tablet Commonly known as:  ULTRAM Take 50-100 mg by mouth every 8 (eight) hours as needed.   VICTOZA 18 MG/3ML Sopn Generic drug:  liraglutide TAKE 1.8 MG EVERY MORNING What changed:  See the new instructions.   warfarin 7.5 MG tablet Commonly known as:  COUMADIN Take 7.5 mg by mouth daily.            Discharge Care Instructions  (From admission, onward)         Start     Ordered   12/06/17 0000  Discharge wound care:    Comments:  Change drain gauze daily.   12/06/17 8403         Follow-up Information    Jules Husbands, MD On 12/11/2017.   Specialty:  General Surgery Why:  @2pm  Contact information: [place] [address] [phone]            Caroleen Hamman, MD FACS

## 2017-12-11 ENCOUNTER — Ambulatory Visit (INDEPENDENT_AMBULATORY_CARE_PROVIDER_SITE_OTHER): Payer: Medicare Other | Admitting: Surgery

## 2017-12-11 ENCOUNTER — Encounter: Payer: Self-pay | Admitting: Surgery

## 2017-12-11 VITALS — BP 122/91 | HR 76 | Temp 97.8°F | Ht 65.0 in | Wt 237.8 lb

## 2017-12-11 DIAGNOSIS — K3532 Acute appendicitis with perforation and localized peritonitis, without abscess: Secondary | ICD-10-CM

## 2017-12-11 MED ORDER — OXYCODONE HCL 5 MG PO TABS: 5.0000 mg | ORAL_TABLET | Freq: Four times a day (QID) | ORAL | 0 refills | Status: DC | PRN

## 2017-12-11 NOTE — Progress Notes (Signed)
Outpatient Surgical Follow Up  12/11/2017  RHANDI DESPAIN is an 57 y.o. female.   Chief Complaint  Patient presents with  . Follow-up    Acute appendicitis w/ perforation    HPI: Lalanya is a 57 year old female recently discharged from the hospital last week from an episode of perforated diverticulitis with abscess requiring drain placement.  She is anticoagulated for her DVT she is on steroids for her COPD.  She now comes for follow-up.  She has been draining about 15 to 30 cc a day from the drain but is mild to moderate is on very thick and purulent.  Past Medical History:  Diagnosis Date  . Asthma   . Atopic dermatitis   . Constipation   . COPD (chronic obstructive pulmonary disease) ([place])   . Diabetes mellitus without complication ([place])   . DVT (deep venous thrombosis) ([place])   . GERD (gastroesophageal reflux disease)   . Hyperlipidemia   . Hypertension   . Rheumatoid arthritis ([place])     Past Surgical History:  Procedure Laterality Date  . right arm surgery    . TUBAL LIGATION      Family History  Family history unknown: Yes    Social History:  reports that she has quit smoking. She has never used smokeless tobacco. She reports that she does not drink alcohol or use drugs.  Allergies: No Known Allergies  Medications reviewed.    ROS Full ROS performed and is otherwise negative other than what is stated in HPI   BP (!) 122/91   Pulse 76   Temp 97.8 F (36.6 C) (Oral)   Ht 5\' 5"  (1.651 m)   Wt 237 lb 12.8 oz (107.9 kg)   BMI 39.57 kg/m   Physical Exam  Constitutional: She is oriented to person, place, and time. She appears well-developed and well-nourished. No distress.  Cardiovascular: Normal rate and regular rhythm.  Pulmonary/Chest: Effort normal and breath sounds normal. No respiratory distress.  Abdominal: Soft. She exhibits no mass. There is no rebound and no guarding. No hernia.  JP in place w ? feculent material  Neurological: She is alert and  oriented to person, place, and time.  Skin: Skin is warm and dry. Capillary refill takes less than 2 seconds. She is not diaphoretic.  Psychiatric: She has a normal mood and affect. Her behavior is normal. Judgment and thought content normal.  Nursing note and vitals reviewed.    Assessment/Plan:  This 57 year old female with history of DVT and is anticoagulated with recent episode of perforated appendicitis managed with antibiotic therapy and drained.  I am concerned that this drain might be becoming a fistula.  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 We will not remove the drain at this time and will treat as a fistula. No need for emergent surgical intervention, She is not toxic. She is to continue A/Bs . F/U next week  Caroleen Hamman, MD [facility]

## 2017-12-11 NOTE — Patient Instructions (Signed)
Please change the drain site dressing daily. You may shower as usual daily.  Please see your follow up appointment listed below.

## 2017-12-18 ENCOUNTER — Other Ambulatory Visit: Payer: Self-pay | Admitting: Internal Medicine

## 2017-12-20 ENCOUNTER — Encounter: Payer: Self-pay | Admitting: Surgery

## 2017-12-20 ENCOUNTER — Ambulatory Visit (INDEPENDENT_AMBULATORY_CARE_PROVIDER_SITE_OTHER): Payer: Medicare Other | Admitting: Surgery

## 2017-12-20 ENCOUNTER — Telehealth: Payer: Self-pay | Admitting: *Deleted

## 2017-12-20 VITALS — BP 131/84 | HR 76 | Temp 98.4°F | Wt 245.0 lb

## 2017-12-20 DIAGNOSIS — K3532 Acute appendicitis with perforation and localized peritonitis, without abscess: Secondary | ICD-10-CM

## 2017-12-20 NOTE — Telephone Encounter (Signed)
Called patient back to let her know that I had spoken to Dr. Dahlia Byes about her concern and he stated that it could be normal for this to happen. However, when I called the patient, I told her that she should keep an eye on it today and if the drain did not output tonight, to please give [place] a call tomorrow morning so we could see her drain. Patient understood and agreed and had no further questions.

## 2017-12-20 NOTE — Patient Instructions (Signed)
Please change your bandages twice a day and drain the drain twice a day.  We will see you back in 2 weeks to make sure that you are doing better.

## 2017-12-20 NOTE — Progress Notes (Signed)
Surgical Consultation  12/20/2017  Brenda Santana is an 57 y.o. female.   Chief Complaint  Patient presents with  . Follow-up    Acute appendicitis     HPI: Brenda Santana is a 57 year old female well-known to me with a recent history of perforated appendicitis and abscess requiring percutaneous drain placement.  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  She has had around 10 to 20 cc of feculent material from the drain.  No fevers no chills she is eating well.  Some occasional pain.  She does have a significant history of COPD, DVT and is currently anticoagulated and on his steroids.    Past Medical History:  Diagnosis Date  . Asthma   . Atopic dermatitis   . Constipation   . COPD (chronic obstructive pulmonary disease) ([place])   . Diabetes mellitus without complication ([place])   . DVT (deep venous thrombosis) ([place])   . GERD (gastroesophageal reflux disease)   . Hyperlipidemia   . Hypertension   . Rheumatoid arthritis ([place])     Past Surgical History:  Procedure Laterality Date  . right arm surgery    . TUBAL LIGATION      Family History  Family history unknown: Yes    Social History:  reports that she has quit smoking. She has never used smokeless tobacco. She reports that she does not drink alcohol or use drugs.  Allergies: No Known Allergies  Medications reviewed.     ROS Full ROS performed and is otherwise negative other than what is stated in the HPI    BP 131/84   Pulse 76   Temp 98.4 F (36.9 C) (Oral)   Wt 245 lb (111.1 kg)   BMI 40.77 kg/m   Physical Exam  Constitutional: She is oriented to person, place, and time. She appears well-developed and well-nourished.  Cardiovascular: Normal rate and regular rhythm.  Pulmonary/Chest: Effort normal. No stridor. No respiratory distress.  Abdominal: Soft. She exhibits no distension and no mass. There is no tenderness. There is no rebound and no guarding.  Neurological: She is alert and oriented to  person, place, and time. She displays normal reflexes. No cranial nerve deficit. She exhibits normal muscle tone. Coordination normal.  Skin: Skin is warm and dry. Capillary refill takes less than 2 seconds.  Psychiatric: She has a normal mood and affect. Her behavior is normal. Judgment and thought content normal.  Nursing note and vitals reviewed.    Assessment/Plan:  57 yo w HX of appendicitis with appendiceal abscess status post drain placement now with a contained and control enterocutaneous fistula that is low output. Currently patient is not toxic and does not require any immediate surgeries.  First order of business is to make sure that her fistula output goes down.  She will need to keep that drain for a few more weeks until all her output has ceased. I will have her come back in a couple weeks to make sure that she is doing okay on my partner Dr. Rosana Hoes will be here.  I will make another appointment in about 4 weeks to see me and to continue her care. She wanted a refill on the pain medication I explained to her that I did not want her to continue on the narcotics because of the potential for addiction.  She was okay with that I did offer her the Flexeril and she is already on gabapentin.  She has refused Flexeril at this time. No need for antibiotic therapy at this  time as I think the drain is controlling her fistula. She will eventually require appendectomy but we need to make sure her fistula closes first.    Caroleen Hamman, MD [place] Surgeon

## 2017-12-20 NOTE — Telephone Encounter (Signed)
Patient called and is concerned about her drain, she is stating that nothing is draining out since she has left the office today. Please call and advise.

## 2017-12-21 ENCOUNTER — Telehealth: Payer: Self-pay | Admitting: Surgery

## 2017-12-21 ENCOUNTER — Telehealth: Payer: Self-pay | Admitting: Licensed Clinical Social Worker

## 2017-12-21 ENCOUNTER — Encounter: Payer: Self-pay | Admitting: Surgery

## 2017-12-21 ENCOUNTER — Ambulatory Visit (INDEPENDENT_AMBULATORY_CARE_PROVIDER_SITE_OTHER): Payer: Medicare Other | Admitting: Surgery

## 2017-12-21 VITALS — BP 134/79 | HR 75 | Temp 97.7°F | Wt 244.0 lb

## 2017-12-21 DIAGNOSIS — K3532 Acute appendicitis with perforation and localized peritonitis, without abscess: Secondary | ICD-10-CM

## 2017-12-21 MED ORDER — CYCLOBENZAPRINE HCL 5 MG PO TABS: 5.0000 mg | ORAL_TABLET | Freq: Three times a day (TID) | ORAL | 0 refills | Status: DC | PRN

## 2017-12-21 MED ORDER — GABAPENTIN 300 MG PO CAPS: 300.0000 mg | ORAL_CAPSULE | Freq: Three times a day (TID) | ORAL | 0 refills | Status: DC

## 2017-12-21 NOTE — Telephone Encounter (Signed)
Patient was scheduled to be seen by Dr. Rosana Hoes today at 3:30 PM.

## 2017-12-21 NOTE — Telephone Encounter (Signed)
CSW attempted to reach patient as a result of a response she gave on an EMMI call. Patient did not have a voicemail/answering machine. Shela Leff MSW,LCSW [phone]

## 2017-12-21 NOTE — Progress Notes (Signed)
Surgical Clinic Progress/Follow-up Note   HPI:  57 y.o. Female presents to clinic for follow-up evaluation one day after she was seen yesterday by Dr. Dahlia Byes for controlled colocutaneous fistula with stool draining from abscess drain, at which time routine follow-up and eventual interval appendectomy upon resolution of fistula was advised. Patient reports she requested to be seen again today due to significantly decreased drainage from her appendiceal abscess drain after she was seen in our office yesterday and her drainage bulb was replaced. She otherwise denies any worsening of her mild RLQ abdominal pain, fever/chills, or N/V and continues to pass flatus and tolerate diet with BM's and appetite WNL. Of note, she was also offered Flexeril and gabapentin yesterday, which she denied, but now states she'd like to reconsider for her mild RLQ peri-drain pain.  Review of Systems:  Constitutional: denies any other weight loss, fever, chills, or sweats  Eyes: denies any other vision changes, history of eye injury  ENT: denies sore throat, hearing problems  Respiratory: denies shortness of breath, wheezing  Cardiovascular: denies chest pain, palpitations  Gastrointestinal: abdominal pain, N/V, and bowel function as per HPI Musculoskeletal: denies any other joint pains or cramps  Skin: Denies any other rashes or skin discolorations  Neurological: denies any other headache, dizziness, weakness  Psychiatric: denies any other depression, anxiety  All other review of systems: otherwise negative   Vital Signs:  BP 134/79   Pulse 75   Temp 97.7 F (36.5 C) (Oral)   Wt 244 lb (110.7 kg)   BMI 40.60 kg/m    Physical Exam:  Constitutional:  -- Obese body habitus  -- Awake, alert, and oriented x3  Eyes:  -- Pupils equally round and reactive to light  -- No scleral icterus  Ear, nose, throat:  -- No jugular venous distension  -- No nasal drainage, bleeding Pulmonary:  -- No crackles -- Equal  breath sounds bilaterally -- Breathing non-labored at rest Cardiovascular:  -- S1, S2 present  -- No pericardial rubs  Gastrointestinal:  -- Soft and non-distended with mild focal peri-drain RLQ abdominal pain, no guarding/rebound -- RLQ abdominal drain well-secured with bulb able to hold suction and minimal somewhat purulent thin brownish-tan fluid in tubing, able to be stripped into suction bulb, no erythema or drainage surrounding drain site -- No abdominal masses appreciated, pulsatile or otherwise  Musculoskeletal / Integumentary:  -- Wounds or skin discoloration: None appreciated except as above (GI)  -- Extremities: B/L UE and LE FROM, hands and feet warm, no edema  Neurologic:  -- Motor function: intact and symmetric  -- Sensation: intact and symmetric   Laboratory studies:  CBC:  Lab Results  Component Value Date   WBC 13.6 (H) 12/06/2017   RBC 3.97 12/06/2017   BMP:  Lab Results  Component Value Date   GLUCOSE 130 (H) 12/06/2017   GLUCOSE 183 (H) 05/08/2014   CO2 31 12/06/2017   CO2 26 05/08/2014   BUN 19 12/06/2017   BUN 29 (H) 05/08/2014   CREATININE 1.04 (H) 12/06/2017   CREATININE 1.24 05/08/2014   CALCIUM 9.1 12/06/2017   CALCIUM 8.8 05/08/2014     Imaging: No new pertinent imaging studies available for review   Assessment:  57 y.o. yo Female with a problem list including...  Patient Active Problem List   Diagnosis Date Noted  . Appendicitis 11/29/2017  . Appendicitis with perforation   . Uncontrolled type 2 diabetes mellitus with hyperglycemia ([place]) 11/09/2017  . 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 Mucopurulent  chronic bronchitis ([place]) 11/09/2017  . Personal history of venous thrombosis and embolism 10/06/2017  . Phlebitis and thrombophlebitis of other sites 06/09/2017  . Obstructive sleep apnea of adult 06/09/2017  . Gout, unspecified 06/09/2017  . Herpesviral vulvovaginitis 06/09/2017  . Type 2 diabetes mellitus with diabetic neuropathy, unspecified  ([place]) 06/09/2017  . Essential (primary) hypertension 06/09/2017  . Chronic anticoagulation 06/09/2017  . Pain medication agreement signed 01/05/2017  . Subacromial bursitis of left shoulder joint 10/19/2016  . Chronic shoulder bursitis, right 06/21/2016  . Chronic pain of left knee 07/14/2014  . Myofascial muscle pain 07/14/2014  . Low back pain 03/18/2014  . COPD (chronic obstructive pulmonary disease) ([place]) 05/01/2013  . Upper respiratory infection 05/01/2013  . Acne 09/12/2012  . Hand dermatitis 09/12/2012  . DVT (deep venous thrombosis) ([place]) 08/31/2012  . Constipation 08/16/2012  . Esophageal reflux disease 08/16/2012  . Asthma 04/10/2012  . Tobacco use disorder 04/02/2012  . Morbid obesity ([place]) 03/01/2012  . Dermatitis 01/11/2012  . Depressive disorder 06/22/2010  . Diabetes mellitus without complication ([place]) 37/34/2876  . Encounter for long-term (current) use of other medications 08/17/2009    presents to clinic for early follow-up evaluation due to decreased drainage from RLQ abscess drain one day after she was seen yesterday by Dr. Dahlia Byes for controlled colocutaneous fistula with stool draining from abscess drain.  Plan:   - drain tubing stripped, continue ongoing drain care as previously instructed  - prescriptions for Flexeril and gabapentin provided as patient discussed with Dr. Dahlia Byes yesterday and previously refused, but requested today  - return to clinic in 2 weeks as already scheduled, instructed to call office if any questions or concerns  All of the above recommendations were discussed with the patient, and all of patient's questions were answered to her expressed satisfaction.  -- Marilynne Drivers Rosana Hoes, MD, [place]: [facility] - Partnering for exceptional care. Office: [phone]

## 2017-12-21 NOTE — Patient Instructions (Signed)
Please follow up with Dr.Pabon as directed.

## 2017-12-21 NOTE — Telephone Encounter (Signed)
Please call patient and schedule her an appointment to be seen later on with Dr. Rosana Hoes. Thank you.

## 2017-12-21 NOTE — Telephone Encounter (Signed)
Patient is calling in regards about her drain, nothing Is going in the drain and was told to empty the drain but nothing is coming through and is asking about the drain. Please call patient and advise.

## 2017-12-22 ENCOUNTER — Ambulatory Visit (INDEPENDENT_AMBULATORY_CARE_PROVIDER_SITE_OTHER): Payer: Medicare Other | Admitting: Adult Health

## 2017-12-22 ENCOUNTER — Other Ambulatory Visit: Payer: Self-pay | Admitting: Internal Medicine

## 2017-12-22 ENCOUNTER — Encounter: Payer: Self-pay | Admitting: Adult Health

## 2017-12-22 VITALS — BP 122/82 | HR 74 | Resp 16 | Ht 65.0 in | Wt 245.0 lb

## 2017-12-22 DIAGNOSIS — I4891 Unspecified atrial fibrillation: Secondary | ICD-10-CM | POA: Diagnosis not present

## 2017-12-22 DIAGNOSIS — Z7901 Long term (current) use of anticoagulants: Secondary | ICD-10-CM

## 2017-12-22 DIAGNOSIS — K3532 Acute appendicitis with perforation and localized peritonitis, without abscess: Secondary | ICD-10-CM | POA: Diagnosis not present

## 2017-12-22 LAB — POCT INR: INR: 2.3 (ref 2.0–3.0)

## 2017-12-22 NOTE — Progress Notes (Signed)
[facility] [place], [place] [address]  Internal MEDICINE  Office Visit Note  Patient Name: Brenda Santana  [id number]  [id number]  Date of Service: 12/22/2017     Chief Complaint  Patient presents with  . Medical Management of Chronic Issues    weight loss    HPI Pt is here for recent hospital follow up.  She was sent to ER at last visit due to appendix rupture and abscess.  She was hospitalized for 8 days and a percutaneous drain was placed.  Pt presents to office with drain in place and appears to be draining.  She reports continued pain at drain site since placement.  She was not sent home with any pain medication. Her INR today is 2.3.  She denies other complaints.  Reports her breathing has been excellent since her last visit.     Current Medication: Outpatient Encounter Medications as of 12/22/2017  Medication Sig Note  . acyclovir (ZOVIRAX) 400 MG tablet Take 1 tablet po TID for 10 days then take twice daily (Patient taking differently: Take 400 mg by mouth 2 (two) times daily. )   . acyclovir ointment (ZOVIRAX) 5 % Apply topically every 3 (three) hours.   Marland Kitchen albuterol (PROVENTIL HFA) 108 (90 Base) MCG/ACT inhaler    . albuterol (PROVENTIL HFA;VENTOLIN HFA) 108 (90 Base) MCG/ACT inhaler INHALE 2 PUFFS 4 TIMES A DAY AS NEEDED   . allopurinol (ZYLOPRIM) 300 MG tablet TAKE ONE TAB AT NIGHT FOR GOUT   . aspirin EC 81 MG tablet Take 81 mg by mouth daily.   Marland Kitchen azithromycin (ZITHROMAX) 250 MG tablet TAKE 1 TABLET BY MOUTH EVERY DAY   . Biotin 2.5 MG TABS Contains vit C and E   . Biotin 2.5 MG TABS Contains vit C and E   . Blood Glucose Monitoring Suppl (GLUCOCOM BLOOD GLUCOSE MONITOR) DEVI once daily.   . Catheters (BARDIC URO-SHEATH EXT CATHETER) MISC 1 Nebulizer  Dx: wheezing   . Catheters (BARDIC URO-SHEATH EXT CATHETER) MISC 1 Nebulizer  Dx: wheezing   . Cetirizine HCl (ZYRTEC ALLERGY) 10 MG CAPS Take by mouth.   . chlorthalidone (HYGROTON) 25 MG  tablet TAKE 1 DAILY EVERY MORNING   . chlorthalidone (HYGROTON) 25 MG tablet Take 12.5 mg by mouth daily.   . clobetasol cream (TEMOVATE) 0.05 % Apply to hands and arm 1-2 times during the day.   . colchicine 0.6 MG tablet Take 1 tablet (0.6 mg total) by mouth daily. Take 2 tablets at onset of pain, then may take 1 more 1 hour later if pain continues.   . cyanocobalamin (,VITAMIN B-12,) 1000 MCG/ML injection Inject into the muscle.   . cyclobenzaprine (FLEXERIL) 5 MG tablet Take 1 tablet (5 mg total) by mouth 3 (three) times daily as needed for muscle spasms.   . diclofenac sodium (VOLTAREN) 1 % GEL    . dicyclomine (BENTYL) 10 MG capsule Take 1 capsule (10 mg total) by mouth 4 (four) times daily -  before meals and at bedtime.   . docusate sodium (COLACE) 100 MG capsule Take 3 caps daily for constipation   . famotidine (PEPCID) 20 MG tablet TAKE 1 TABLET BY MOUTH EVERY DAY   . Ferrous Sulfate (IRON) 325 (65 Fe) MG TABS Take 1 tablet by mouth daily.   . Fluticasone-Salmeterol (ADVAIR DISKUS) 500-50 MCG/DOSE AEPB TAKE 1 PUFF BY MOUTH TWICE A DAY   . gabapentin (NEURONTIN) 300 MG capsule Take 1 capsule (300 mg total)  by mouth 3 (three) times daily.   Marland Kitchen gabapentin (NEURONTIN) 800 MG tablet Take 800 mg by mouth 3 (three) times daily.   Marland Kitchen glucose blood (ACCU-CHEK ACTIVE STRIPS) test strip once daily.   Marland Kitchen HYDROcodone-acetaminophen (NORCO/VICODIN) 5-325 MG tablet Take 1 tablet by mouth every 4 (four) hours as needed.   Marland Kitchen HYDROcodone-acetaminophen (NORCO/VICODIN) 5-325 MG tablet Take 1 tablet by mouth every 6 (six) hours as needed for moderate pain.   . hydrocortisone (ANUSOL-HC) 25 MG suppository 25 mg daily as needed.   . hydroquinone 4 % cream Apply topically.   . INCRUSE ELLIPTA 62.5 MCG/INH AEPB TAKE 1 PUFF BY MOUTH EVERY DAY   . INSULIN DEGLUDEC-LIRAGLUTIDE [place] Inject into the skin.   . INVOKAMET 50-500 MG TABS TAKE 1 TABLET BY MOUTH TWICE A DAY   . ipratropium-albuterol (DUONEB) 0.5-2.5 (3)  MG/3ML SOLN Take 3 mLs by nebulization every 6 (six) hours as needed.   . Lancets MISC Frequency:QD   Dosage:0.0     Instructions:  Note:Dose: 1   . lidocaine (XYLOCAINE) 5 % ointment APPLY A SMALL AMOUNT TO AFFECTED AREA 2-3 TIMES A DAY   . LINZESS 145 MCG CAPS capsule TAKE ONE CAPSULE BY MOUTH ONCE DAILY FOR CONSTIPATION   . lisinopril (PRINIVIL,ZESTRIL) 10 MG tablet TAKE 1 TAB DAILY IN MORNING   . meclizine (ANTIVERT) 12.5 MG tablet TAKE 1 TABLET BY MOUTH TWICE A DAY AS NEEDED FOR DIZZINESS   . meloxicam (MOBIC) 7.5 MG tablet Take by mouth.   . metFORMIN (GLUCOPHAGE) 1000 MG tablet Take by mouth.   . metoprolol tartrate (LOPRESSOR) 50 MG tablet TAKE 1 TABLET BY MOUTH TWICE A DAY   . montelukast (SINGULAIR) 10 MG tablet TAKE 1 TABLET BY MOUTH DAILY FOR ASTHMA (Patient taking differently: TAKE 1 TABLET BY MOUTH TWICE DAILY FOR ASTHMA)   . ondansetron (ZOFRAN) 4 MG tablet Take 1 tablet (4 mg total) by mouth every 8 (eight) hours as needed for nausea or vomiting.   . ondansetron (ZOFRAN-ODT) 4 MG disintegrating tablet Take 1 tablet (4 mg total) by mouth every 6 (six) hours as needed for nausea.   Marland Kitchen oxyCODONE (ROXICODONE) 5 MG immediate release tablet Take 1 tablet (5 mg total) by mouth every 6 (six) hours as needed.   . Peak Flow Meter (PERSONAL BEST FULL RANGE) DEVI Frequency:PHARMDIR   Dosage:0.0     Instructions:  Note:Please provide peak flow meter to be used for asthma/COPD monitoring Dose: 1   . phentermine 37.5 MG capsule Take 1 capsule (37.5 mg total) by mouth every morning.   . predniSONE (DELTASONE) 5 MG tablet TAKE 2 TABLETS BY MOUTH EVERY DAY (Patient taking differently: TAKE 20MG   BY MOUTH EVERY DAY) 11/29/2017: PT STATES SHE TAKES 60MG  IF SHE IS HAVING BREATHING PROBLEMS  . Spacer/Aero-Holding Chambers (AEROCHAMBER MV) inhaler Use with albuterol every 4-6 hours as needed.   . Thermometer Sheaths (B-D THERMOMETER ORAL SHEATHS) MISC 1 tailbone donut for coccyx injury   . traMADol (ULTRAM)  50 MG tablet Take 50-100 mg by mouth every 8 (eight) hours as needed.    . tretinoin (RETIN-A) 0.025 % cream APPLY TO AFFECTED AREA AT NIGHT   . valACYclovir (VALTREX) 500 MG tablet    . VICTOZA 18 MG/3ML SOPN TAKE 1.8 MG EVERY MORNING   . warfarin (COUMADIN) 5 MG tablet TAKE 1 AND 1/2 TABS (7.5) DAILY OR AS DIRECTED   . warfarin (COUMADIN) 7.5 MG tablet Take 7.5 mg by mouth daily.   Marland Kitchen azithromycin (ZITHROMAX)  250 MG tablet TAKE AS DIRECTED ON PACKAGING    No facility-administered encounter medications on file as of 12/22/2017.     Surgical History: Past Surgical History:  Procedure Laterality Date  . right arm surgery    . TUBAL LIGATION      Medical History: Past Medical History:  Diagnosis Date  . Asthma   . Atopic dermatitis   . Constipation   . COPD (chronic obstructive pulmonary disease) ([place])   . Diabetes mellitus without complication ([place])   . DVT (deep venous thrombosis) ([place])   . GERD (gastroesophageal reflux disease)   . Hyperlipidemia   . Hypertension   . Rheumatoid arthritis ([place])     Family History: Family History  Family history unknown: Yes    Social History   Socioeconomic History  . Marital status: Single    Spouse name: Not on file  . Number of children: Not on file  . Years of education: Not on file  . Highest education level: Not on file  Occupational History  . Not on file  Social Needs  . Financial resource strain: Not on file  . Food insecurity:    Worry: Not on file    Inability: Not on file  . Transportation needs:    Medical: Not on file    Non-medical: Not on file  Tobacco Use  . Smoking status: Former Research scientist (life sciences)  . Smokeless tobacco: Never Used  Substance and Sexual Activity  . Alcohol use: No  . Drug use: No  . Sexual activity: Not on file  Lifestyle  . Physical activity:    Days per week: Not on file    Minutes per session: Not on file  . Stress: Not on file  Relationships  . Social connections:    Talks on phone: Not on  file    Gets together: Not on file    Attends religious service: Not on file    Active member of club or organization: Not on file    Attends meetings of clubs or organizations: Not on file    Relationship status: Not on file  . Intimate partner violence:    Fear of current or ex partner: Not on file    Emotionally abused: Not on file    Physically abused: Not on file    Forced sexual activity: Not on file  Other Topics Concern  . Not on file  Social History Narrative  . Not on file      Review of Systems  Constitutional: Negative for chills, fatigue and unexpected weight change.  HENT: Negative for congestion, rhinorrhea, sneezing and sore throat.   Eyes: Negative for photophobia, pain and redness.  Respiratory: Negative for cough, chest tightness and shortness of breath.   Cardiovascular: Negative for chest pain and palpitations.  Gastrointestinal: Negative for abdominal pain, constipation, diarrhea, nausea and vomiting.       Pain at percutaneous drain insertion site.  Endocrine: Negative.   Genitourinary: Negative for dysuria and frequency.  Musculoskeletal: Negative for arthralgias, back pain, joint swelling and neck pain.  Skin: Negative for rash.  Allergic/Immunologic: Negative.   Neurological: Negative for tremors and numbness.  Hematological: Negative for adenopathy. Does not bruise/bleed easily.  Psychiatric/Behavioral: Negative for behavioral problems and sleep disturbance. The patient is not nervous/anxious.     Vital Signs: BP 122/82   Pulse 74   Resp 16   Ht 5\' 5"  (1.651 m)   Wt 245 lb (111.1 kg)   SpO2 98%   BMI  40.77 kg/m    Physical Exam  Constitutional: She is oriented to person, place, and time. She appears well-developed and well-nourished. No distress.  HENT:  Head: Normocephalic and atraumatic.  Mouth/Throat: Oropharynx is clear and moist. No oropharyngeal exudate.  Eyes: Pupils are equal, round, and reactive to light. EOM are normal.  Neck:  Normal range of motion. Neck supple. No JVD present. No tracheal deviation present. No thyromegaly present.  Cardiovascular: Normal rate, regular rhythm and normal heart sounds. Exam reveals no gallop and no friction rub.  No murmur heard. Pulmonary/Chest: Effort normal and breath sounds normal. No respiratory distress. She has no wheezes. She has no rales. She exhibits no tenderness.  Abdominal: Soft. Bowel sounds are normal. There is no tenderness. There is no guarding.  Percutaneous drain in RLQ with JP drain.  Pt is emptying approximately 2 times daily.  Output appears bile colored.   Musculoskeletal: Normal range of motion.  Lymphadenopathy:    She has no cervical adenopathy.  Neurological: She is alert and oriented to person, place, and time. No cranial nerve deficit.  Skin: Skin is warm and dry. She is not diaphoretic.  Psychiatric: She has a normal mood and affect. Her behavior is normal. Judgment and thought content normal.  Nursing note and vitals reviewed.  Assessment/Plan: 1. Appendicitis with perforation Pt saw surgery yesterday due to malfunctioning JP drain. Pt will likely have to keep drain in place for a few more weeks.  She is reporting continued pain.  Given Flexeril and Gabapentin at visit with surgery yesterday.  Pt reports she is not taking either because they do not help.  Pt is out of oxycodone which I declined to refill for her at this visit.  I encouraged her to take Gabapentin and flexeril and to give it some time to help her.  She will follow up with surgery again on Sept 5th.   2. Atrial fibrillation, unspecified type ([place]) INR2.3 today.  Continue current regimen. She takes 7.5mg  every day except Tuesday.  - POCT INR  3. Morbid obesity (HCC) Obesity Counseling: Risk Assessment: An assessment of behavioral risk factors was made today and includes lack of exercise sedentary lifestyle, lack of portion control and poor dietary habits.  Risk Modification Advice: She  was counseled on portion control guidelines. Restricting daily caloric intake to. . The detrimental long term effects of obesity on her health and ongoing poor compliance was also discussed with the patient.  General Counseling: elvenia godden understanding of the findings of todays visit and agrees with plan of treatment. I have discussed any further diagnostic evaluation that may be needed or ordered today. We also reviewed her medications today. she has been encouraged to call the office with any questions or concerns that should arise related to todays visit.  Orders Placed This Encounter  Procedures  . POCT INR    I have reviewed all medical records from hospital follow up including radiology reports and consults from other physicians. Appropriate follow up diagnostics will be scheduled as needed. Patient/ Family understands the plan of treatment. Time spent 25 minutes.   Dr Lavera Guise, MD Internal Medicine

## 2017-12-22 NOTE — Patient Instructions (Signed)

## 2017-12-25 ENCOUNTER — Encounter: Payer: Self-pay | Admitting: Surgery

## 2017-12-28 ENCOUNTER — Ambulatory Visit: Payer: Self-pay | Admitting: Surgery

## 2018-01-04 ENCOUNTER — Encounter: Payer: Self-pay | Admitting: Surgery

## 2018-01-04 ENCOUNTER — Ambulatory Visit (INDEPENDENT_AMBULATORY_CARE_PROVIDER_SITE_OTHER): Payer: Medicare Other | Admitting: Surgery

## 2018-01-04 ENCOUNTER — Ambulatory Visit: Payer: Self-pay | Admitting: Surgery

## 2018-01-04 VITALS — BP 102/62 | HR 68 | Temp 97.3°F | Ht 65.0 in | Wt 241.2 lb

## 2018-01-04 DIAGNOSIS — K632 Fistula of intestine: Secondary | ICD-10-CM | POA: Diagnosis not present

## 2018-01-04 NOTE — Patient Instructions (Addendum)
Call our office if you have questions or concerns.   Please see your follow up appointment listed below.   Please continue to record the drain output.

## 2018-01-09 NOTE — Progress Notes (Signed)
Surgical Clinic Progress/Follow-up Note   HPI:  57 y.o. 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 Patient reports she's been tolerating her regular diet with +flatus and +BM's. Though she says she never filled prescriptions for Flexeril or gabapentin because she "know[s] they don't work", she now denies abdominal pain, N/V, fever/chills, CP, or SOB.  Review of Systems:  Constitutional: denies any other weight loss, fever, chills, or sweats  Eyes: denies any other vision changes, history of eye injury  ENT: denies sore throat, hearing problems  Respiratory: denies shortness of breath, wheezing  Cardiovascular: denies chest pain, palpitations  Gastrointestinal: abdominal pain, N/V, and bowel function as per HPI Musculoskeletal: denies any other joint pains or cramps  Skin: Denies any other rashes or skin discolorations  Neurological: denies any other headache, dizziness, weakness  Psychiatric: denies any other depression, anxiety  All other review of systems: otherwise negative   Vital Signs:  BP 102/62   Pulse 68   Temp (!) 97.3 F (36.3 C) (Temporal)   Ht 5\' 5"  (1.651 m)   Wt 241 lb 3.2 oz (109.4 kg)   BMI 40.14 kg/m    Physical Exam:  Constitutional:  -- Obese body habitus  -- Awake, alert, and oriented x3  Eyes:  -- Pupils equally round and reactive to light  -- No scleral icterus  Ear, nose, throat:  -- No jugular venous distension  -- No nasal drainage, bleeding Pulmonary:  -- No crackles -- Equal breath sounds bilaterally -- Breathing non-labored at rest Cardiovascular:  -- S1, S2 present  -- No pericardial rubs  Gastrointestinal:  -- Soft, nontender, non-distended, no guarding/rebound -- Well-secure RLQ abdominal drain with feculent-smelling brown liquid into drain, no surrounding erythema or leakage, though suction collection bulb pulling  somewhat on suture-secured drain insertion site -- No abdominal masses appreciated, pulsatile or otherwise  Musculoskeletal / Integumentary:  -- Wounds or skin discoloration: None appreciated except as described above (GI)  -- Extremities: B/L UE and LE FROM, hands and feet warm, no edema  Neurologic:  -- Motor function: intact and symmetric  -- Sensation: intact and symmetric   Laboratory studies:  CBC:  Lab Results  Component Value Date   WBC 13.6 (H) 12/06/2017   RBC 3.97 12/06/2017   BMP:  Lab Results  Component Value Date   GLUCOSE 130 (H) 12/06/2017   GLUCOSE 183 (H) 05/08/2014   CO2 31 12/06/2017   CO2 26 05/08/2014   BUN 19 12/06/2017   BUN 29 (H) 05/08/2014   CREATININE 1.04 (H) 12/06/2017   CREATININE 1.24 05/08/2014   CALCIUM 9.1 12/06/2017   CALCIUM 8.8 05/08/2014     Assessment:  57 y.o. yo Female with a problem list including...  Patient Active Problem List   Diagnosis Date Noted  . Appendicitis with perforation   . Uncontrolled type 2 diabetes mellitus with hyperglycemia ([place]) 11/09/2017  . 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 Mucopurulent chronic bronchitis ([place]) 11/09/2017  . Personal history of venous thrombosis and embolism 10/06/2017  . Phlebitis and thrombophlebitis of other sites 06/09/2017  . Obstructive sleep apnea of adult 06/09/2017  . Gout, unspecified 06/09/2017  . Herpesviral vulvovaginitis 06/09/2017  . Type 2 diabetes mellitus with diabetic neuropathy, unspecified ([place]) 06/09/2017  . Essential (primary) hypertension 06/09/2017  . Chronic anticoagulation 06/09/2017  . Pain medication agreement signed 01/05/2017  . Subacromial bursitis of left shoulder joint 10/19/2016  . Chronic shoulder bursitis, right 06/21/2016  .  Chronic pain of left knee 07/14/2014  . Myofascial muscle pain 07/14/2014  . Low back pain 03/18/2014  . COPD (chronic obstructive pulmonary disease) ([place]) 05/01/2013  . Upper respiratory infection 05/01/2013  . Acne 09/12/2012   . Hand dermatitis 09/12/2012  . DVT (deep venous thrombosis) ([place]) 08/31/2012  . Constipation 08/16/2012  . Esophageal reflux disease 08/16/2012  . Asthma 04/10/2012  . Tobacco use disorder 04/02/2012  . Morbid obesity ([place]) 03/01/2012  . Dermatitis 01/11/2012  . Depressive disorder 06/22/2010  . Diabetes mellitus without complication ([place]) 09/30/5613  . 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  Plan:   - continue drain care as previously instructed  - currently no indication to restart antibiotics (completed)  - await closure of colocutaneous fistula vs operative planning as per Dr. Dahlia Byes  - return to clinic for follow-up with Dr. Dahlia Byes as scheduled  - instructed to call office if any questions or concerns  - pre-surgical weight loss encouraged  All of the above recommendations were discussed with the patient, and all of patient's questions were answered to her expressed satisfaction.  -- Marilynne Drivers Rosana Hoes, MD, [place]: [facility] - Partnering for exceptional care. Office: [phone]

## 2018-01-10 ENCOUNTER — Other Ambulatory Visit: Payer: Self-pay | Admitting: Internal Medicine

## 2018-01-11 ENCOUNTER — Encounter: Payer: Self-pay | Admitting: Surgery

## 2018-01-11 ENCOUNTER — Ambulatory Visit: Payer: Self-pay

## 2018-01-12 ENCOUNTER — Other Ambulatory Visit: Payer: Self-pay

## 2018-01-15 ENCOUNTER — Other Ambulatory Visit: Payer: Self-pay | Admitting: Surgery

## 2018-01-17 ENCOUNTER — Ambulatory Visit (INDEPENDENT_AMBULATORY_CARE_PROVIDER_SITE_OTHER): Payer: Medicare Other

## 2018-01-17 ENCOUNTER — Ambulatory Visit: Payer: Self-pay | Admitting: Surgery

## 2018-01-17 DIAGNOSIS — Z7901 Long term (current) use of anticoagulants: Secondary | ICD-10-CM | POA: Insufficient documentation

## 2018-01-17 LAB — POCT INR: INR: 1.6 — AB (ref 2.0–3.0)

## 2018-01-22 ENCOUNTER — Other Ambulatory Visit: Payer: Self-pay

## 2018-01-22 ENCOUNTER — Other Ambulatory Visit: Payer: Self-pay | Admitting: *Deleted

## 2018-01-22 ENCOUNTER — Ambulatory Visit (INDEPENDENT_AMBULATORY_CARE_PROVIDER_SITE_OTHER): Payer: Medicare Other | Admitting: Surgery

## 2018-01-22 ENCOUNTER — Encounter: Payer: Self-pay | Admitting: *Deleted

## 2018-01-22 ENCOUNTER — Telehealth: Payer: Self-pay | Admitting: *Deleted

## 2018-01-22 ENCOUNTER — Encounter: Payer: Self-pay | Admitting: Surgery

## 2018-01-22 ENCOUNTER — Other Ambulatory Visit: Payer: Self-pay | Admitting: Internal Medicine

## 2018-01-22 VITALS — BP 109/76 | HR 84 | Temp 97.2°F | Resp 12 | Ht 65.0 in | Wt 236.8 lb

## 2018-01-22 DIAGNOSIS — K3532 Acute appendicitis with perforation and localized peritonitis, without abscess: Secondary | ICD-10-CM | POA: Diagnosis not present

## 2018-01-22 DIAGNOSIS — K632 Fistula of intestine: Secondary | ICD-10-CM

## 2018-01-22 NOTE — Patient Instructions (Signed)
Please schedule patient for fistulagram. She is to return to the office 1 week after the procedure.

## 2018-01-22 NOTE — Telephone Encounter (Signed)
Message left for patient to call the office.   We need to let patient know that she is scheduled for a fistulogram at [facility] on Wednesday, 01-24-18 at 7:30 am. She will need to check-in at the [facility] registration desk (1st desk on right). Prep: none (per Pamala Hurry in [employer]).   *When patient calls back, we will need to schedule a follow up appointment with Dr. Dahlia Byes for 1-2 weeks.   Also, Dr. Dahlia Byes wants her to be seen by [facility] for a colonoscopy. Referral placed in Epic and their office will be calling patient to schedule an appointment with Dr. Vicente Males or Dr. Allen Norris.

## 2018-01-22 NOTE — Telephone Encounter (Signed)
Patient called the office back and was notified of plans for fistulogram later this week and referral to [facility]. She verbalizes understanding.   The patient was instructed to call the office back if she has further questions.

## 2018-01-22 NOTE — Progress Notes (Signed)
Brenda Santana is a 57 year old female with a history of appendiceal abscess status post drain placement.  Developed a colocutaneous fistula likely from the drain being placed within the bowel lumen.  Currently she is symptom-free and drinking less than 10 cc a day from her JP.  She is eating well no fevers no chills and no abdominal pain   PE: NAD Chest: CTA, no wheezes, S1,s2. NSR Abd: soft, drain in place w sulcus stain bulb. No peritonitis Ext: no edema   A/P patient developed a colocutaneous fistula that is controlled by drain we will obtain a fistulogram.  I have also reviewed all her records and she did have a colonoscopy more than 5 years ago.  Given her age, the unusual presentation I also would like to obtain a colonoscopy before proceeding with appendectomy.. DisCussed with the patient in detail about my thought process and my recommendations. I spent More than 25 minutes in this encounter with greater than 50% spent in counseling and coordination of her care

## 2018-01-24 ENCOUNTER — Ambulatory Visit: Admission: RE | Admit: 2018-01-24 | Payer: Medicare Other | Source: Ambulatory Visit

## 2018-01-26 ENCOUNTER — Ambulatory Visit: Payer: Self-pay

## 2018-01-29 ENCOUNTER — Encounter: Payer: Self-pay | Admitting: Gastroenterology

## 2018-01-29 ENCOUNTER — Ambulatory Visit (INDEPENDENT_AMBULATORY_CARE_PROVIDER_SITE_OTHER): Payer: Medicare Other

## 2018-01-29 ENCOUNTER — Telehealth: Payer: Self-pay

## 2018-01-29 ENCOUNTER — Ambulatory Visit
Admission: RE | Admit: 2018-01-29 | Discharge: 2018-01-29 | Disposition: A | Discharge status: Home / Self Care | Payer: Medicare Other | Source: Ambulatory Visit | Attending: Surgery | Admitting: Surgery

## 2018-01-29 ENCOUNTER — Ambulatory Visit: Payer: Medicare Other | Admitting: Gastroenterology

## 2018-01-29 ENCOUNTER — Other Ambulatory Visit: Payer: Self-pay

## 2018-01-29 ENCOUNTER — Ambulatory Visit (INDEPENDENT_AMBULATORY_CARE_PROVIDER_SITE_OTHER): Payer: Medicare Other | Admitting: Gastroenterology

## 2018-01-29 ENCOUNTER — Telehealth: Payer: Self-pay | Admitting: *Deleted

## 2018-01-29 VITALS — BP 99/60 | HR 94 | Resp 17 | Ht 65.0 in | Wt 235.6 lb

## 2018-01-29 DIAGNOSIS — K3532 Acute appendicitis with perforation and localized peritonitis, without abscess: Secondary | ICD-10-CM

## 2018-01-29 DIAGNOSIS — K632 Fistula of intestine: Secondary | ICD-10-CM

## 2018-01-29 DIAGNOSIS — Z7901 Long term (current) use of anticoagulants: Secondary | ICD-10-CM

## 2018-01-29 LAB — POCT INR: INR: 3.2 — AB (ref 2.0–3.0)

## 2018-01-29 MED ORDER — IOPAMIDOL (ISOVUE-300) INJECTION 61%
50.0000 mL | Freq: Once | INTRAVENOUS | Status: AC | PRN
  Administered 2018-01-29: 25 mL

## 2018-01-29 NOTE — Progress Notes (Signed)
Jonathon Bellows MD, MRCP(U.K) [address]  [place]  [place], [place] [address]  Main: [phone]  Fax: [phone]   Gastroenterology Consultation  Referring Provider:     Jules Husbands, MD Primary Care Physician:  Lavera Guise, MD Primary Gastroenterologist:  Dr. Jonathon Bellows  Reason for Consultation:     Colonoscopy        HPI:   Brenda Santana is a 57 y.o. y/o female referred for consultation & management  by Dr. Humphrey Rolls, Timoteo Gaul, MD.    She is a patient with a history of an appendicular abscess S/p drain , developed a colocutaneous fistula likely secondary to the drain. She sees Dr Dahlia Byes.   Dr Dahlia Byes would like a colonoscopy before appendectomy, The Peritoneogram performed today shows no fistula  She thinks she may have had a colonoscopy . No family history of colon cancer. No rectal bleeding , no abdominal pain.   She states that the appendicitis occurred 2 months back, presented with abdominal pain . Presently off all antibiotics, denies any fevers.  On coumadin for blood clots 7 years back.    Past Medical History:  Diagnosis Date  . Asthma   . Atopic dermatitis   . Constipation   . COPD (chronic obstructive pulmonary disease) ([place])   . Diabetes mellitus without complication ([place])   . DVT (deep venous thrombosis) ([place])   . GERD (gastroesophageal reflux disease)   . Hyperlipidemia   . Hypertension   . Rheumatoid arthritis ([place])     Past Surgical History:  Procedure Laterality Date  . right arm surgery    . TUBAL LIGATION      Prior to Admission medications   Medication Sig Start Date End Date Taking? Authorizing Provider  acyclovir (ZOVIRAX) 400 MG tablet Take 1 tablet po TID for 10 days then take twice daily Patient taking differently: Take 400 mg by mouth 2 (two) times daily.  11/09/17  Yes Boscia, Greer Ee, NP  acyclovir ointment (ZOVIRAX) 5 % Apply topically every 3 (three) hours. 11/09/17  Yes Ronnell Freshwater, NP  albuterol (PROVENTIL HFA) 108  (90 Base) MCG/ACT inhaler  05/04/14  Yes [provider]  allopurinol (ZYLOPRIM) 300 MG tablet TAKE ONE TAB AT NIGHT FOR GOUT 05/22/17  Yes Ronnell Freshwater, NP  aspirin EC 81 MG tablet Take 81 mg by mouth daily.   Yes [provider]  Biotin 2.5 MG TABS Contains vit C and E 08/12/14  Yes [provider]  Blood Glucose Monitoring Suppl (GLUCOCOM BLOOD GLUCOSE MONITOR) DEVI once daily. 05/25/11  Yes [provider]  Catheters (BARDIC URO-SHEATH EXT CATHETER) MISC 1 Nebulizer  Dx: wheezing 11/27/12  Yes [provider]  chlorthalidone (HYGROTON) 25 MG tablet TAKE 1 TABLET BY MOUTH DAILY EVERY MORNING 01/11/18  Yes Scarboro, Audie Clear, NP  clobetasol cream (TEMOVATE) 0.05 % Apply to hands and arm 1-2 times during the day. 08/22/13  Yes [provider]  colchicine 0.6 MG tablet Take 1 tablet (0.6 mg total) by mouth daily. Take 2 tablets at onset of pain, then may take 1 more 1 hour later if pain continues. 09/20/15  Yes Hagler, Jami L, PA-C  diclofenac sodium (VOLTAREN) 1 % GEL  12/07/17  Yes [provider]  docusate sodium (COLACE) 100 MG capsule Take 3 caps daily for constipation 08/16/12  Yes [provider]  famotidine (PEPCID) 20 MG tablet TAKE 1 TABLET BY MOUTH EVERY DAY 09/04/13  Yes [provider]  Ferrous Sulfate (IRON) 325 (65 Fe) MG TABS Take 1 tablet by mouth daily.   Yes [provider]  gabapentin (NEURONTIN) 800 MG tablet Take 800 mg by mouth 3 (three) times daily.   Yes [provider]  glucose blood (ACCU-CHEK ACTIVE STRIPS) test strip once daily. 05/25/11  Yes [provider]  hydrocortisone (ANUSOL-HC) 25 MG suppository 25 mg daily as needed. 12/21/10  Yes [provider]  hydroquinone 4 % cream Apply topically. 09/01/14  Yes [provider]  INVOKAMET 50-500 MG TABS TAKE 1 TABLET BY MOUTH TWICE A DAY 12/22/17  Yes Scarboro, Audie Clear, NP  ipratropium-albuterol (DUONEB) 0.5-2.5  (3) MG/3ML SOLN Take 3 mLs by nebulization every 6 (six) hours as needed.   Yes [provider]  Lancets MISC Frequency:QD   Dosage:0.0     Instructions:  Note:Dose: 1 01/26/10  Yes [provider]  LINZESS 145 MCG CAPS capsule TAKE ONE CAPSULE BY MOUTH ONCE DAILY FOR CONSTIPATION 10/23/17  Yes Lavera Guise, MD  lisinopril (PRINIVIL,ZESTRIL) 10 MG tablet TAKE 1 TAB DAILY IN MORNING 07/28/17  Yes Boscia, Heather E, NP  metoprolol tartrate (LOPRESSOR) 50 MG tablet TAKE 1 TABLET BY MOUTH TWICE A DAY 10/19/17  Yes Boscia, Heather E, NP  montelukast (SINGULAIR) 10 MG tablet TAKE 1 TABLET BY MOUTH DAILY FOR ASTHMA Patient taking differently: TAKE 1 TABLET BY MOUTH TWICE DAILY FOR ASTHMA 11/21/17  Yes Boscia, Heather E, NP  Peak Flow Meter (PERSONAL BEST FULL RANGE) DEVI Frequency:PHARMDIR   Dosage:0.0     Instructions:  Note:Please provide peak flow meter to be used for asthma/COPD monitoring Dose: 1 05/30/12  Yes [provider]  phentermine 37.5 MG capsule Take 1 capsule (37.5 mg total) by mouth every morning. 11/09/17  Yes Boscia, Heather E, NP  predniSONE (DELTASONE) 5 MG tablet TAKE 2 TABLETS BY MOUTH EVERY DAY Patient taking differently: TAKE 20MG   BY MOUTH EVERY DAY 09/14/17  Yes Lavera Guise, MD  Spacer/Aero-Holding Chambers (AEROCHAMBER MV) inhaler Use with albuterol every 4-6 hours as needed. 11/27/12  Yes [provider]  traMADol (ULTRAM) 50 MG tablet Take 50-100 mg by mouth every 8 (eight) hours as needed.    Yes [provider]  tretinoin (RETIN-A) 0.025 % cream APPLY TO AFFECTED AREA AT NIGHT 09/25/17  Yes [provider]  valACYclovir (VALTREX) 500 MG tablet  10/08/14  Yes [provider]  VICTOZA 18 MG/3ML SOPN TAKE 1.8 MG EVERY MORNING 12/01/17  Yes Lavera Guise, MD  warfarin (COUMADIN) 5 MG tablet TAKE 1 AND 1/2 TABS (7.5) DAILY OR AS DIRECTED 09/12/17  Yes [provider]  warfarin (COUMADIN) 7.5 MG tablet Take 7.5 mg by mouth  daily.   Yes [provider]  azithromycin (ZITHROMAX) 250 MG tablet TAKE 1 TABLET BY MOUTH EVERY DAY Patient not taking: Reported on 01/29/2018 10/17/17   Ronnell Freshwater, NP  Cetirizine HCl (ZYRTEC ALLERGY) 10 MG CAPS Take by mouth.    [provider]  cyanocobalamin (,VITAMIN B-12,) 1000 MCG/ML injection Inject into the muscle.    [provider]  cyclobenzaprine (FLEXERIL) 5 MG tablet Take 1 tablet (5 mg total) by mouth 3 (three) times daily as needed for muscle spasms. Patient not taking: Reported on 01/29/2018 12/21/17   Vickie Epley, MD  dicyclomine (BENTYL) 10 MG capsule Take 1 capsule (10 mg total) by mouth 4 (four) times daily -  before meals and at bedtime. Patient not taking: Reported on 01/29/2018 11/28/17  Scarboro, Audie Clear, NP  doxycycline (VIBRA-TABS) 100 MG tablet TAKE 1 TABLET (100 MG TOTAL) BY MOUTH TWO (2) TIMES A DAY. 01/24/18   [provider]  Fluticasone-Salmeterol (ADVAIR DISKUS) 500-50 MCG/DOSE AEPB TAKE 1 PUFF BY MOUTH TWICE A DAY 08/04/14   [provider]  gabapentin (NEURONTIN) 300 MG capsule Take 1 capsule (300 mg total) by mouth 3 (three) times daily. Patient not taking: Reported on 01/29/2018 12/21/17   Vickie Epley, MD  HYDROcodone-acetaminophen (NORCO/VICODIN) 5-325 MG tablet Take 1 tablet by mouth every 6 (six) hours as needed for moderate pain. Patient not taking: Reported on 01/29/2018 12/06/17   Jules Husbands, MD  INCRUSE ELLIPTA 62.5 MCG/INH AEPB TAKE 1 PUFF BY MOUTH EVERY DAY Patient not taking: Reported on 01/29/2018 12/18/17   Lavera Guise, MD  INSULIN DEGLUDEC-LIRAGLUTIDE [place] Inject into the skin.    [provider]  meclizine (ANTIVERT) 12.5 MG tablet TAKE 1 TABLET BY MOUTH TWICE A DAY AS NEEDED FOR DIZZINESS 03/08/16   [provider]  meloxicam (MOBIC) 7.5 MG tablet Take by mouth.    [provider]  metFORMIN (GLUCOPHAGE) 1000 MG tablet Take by mouth. 08/27/13   [provider]  ondansetron (ZOFRAN) 4 MG tablet Take 1 tablet (4 mg total) by mouth every 8 (eight) hours as needed for nausea or vomiting. Patient not taking: Reported on 01/29/2018 11/28/17   Kendell Bane, NP  ondansetron (ZOFRAN-ODT) 4 MG disintegrating tablet Take 1 tablet (4 mg total) by mouth every 6 (six) hours as needed for nausea. Patient not taking: Reported on 01/29/2018 12/06/17   Nicholes Mango, MD  Thermometer Sheaths (B-D THERMOMETER ORAL SHEATHS) MISC 1 tailbone donut for coccyx injury 03/11/14   [provider]    Family History  Family history unknown: Yes     Social History   Tobacco Use  . Smoking status: Former Research scientist (life sciences)  . Smokeless tobacco: Never Used  Substance Use Topics  . Alcohol use: No  . Drug use: No    Allergies as of 01/29/2018  . (No Known Allergies)    Review of Systems:    All systems reviewed and negative except where noted in HPI.   Physical Exam:  BP 99/60 (BP Location: Left Arm, Patient Position: Sitting, Cuff Size: Large)   Pulse 94   Resp 17   Ht 5\' 5"  (1.651 m)   Wt 235 lb 9.6 oz (106.9 kg)   BMI 39.21 kg/m  No LMP recorded. (Menstrual status: Perimenopausal). Psych:  Alert and cooperative. Normal mood and affect. General:   Alert,  Well-developed, well-nourished, pleasant and cooperative in NAD Head:  Normocephalic and atraumatic. Eyes:  Sclera clear, no icterus.   Conjunctiva pink. Ears:  Normal auditory acuity. Nose:  No deformity, discharge, or lesions. Mouth:  No deformity or lesions,oropharynx pink & moist. Neck:  Supple; no masses or thyromegaly. Lungs:  Respirations even and unlabored.  Clear throughout to auscultation.   No wheezes, crackles, or rhonchi. No acute distress. Heart:  Regular rate and rhythm; no murmurs, clicks, rubs, or gallops. Abdomen:  Normal bowel sounds. Drain in the RLQ ,  No bruits.  Soft, non-tender and non-distended without masses, hepatosplenomegaly or hernias noted.  No guarding or rebound tenderness.     Neurologic:  Alert and oriented x3;  grossly normal neurologically. Skin:  Intact without significant lesions or rashes. No jaundice. Lymph Nodes:  No significant cervical adenopathy. Psych:  Alert and cooperative. Normal mood and affect.  Imaging  Studies: Dg Peritoneogram/herniogram  Result Date: 01/29/2018 CLINICAL DATA:  Ruptured appendicitis. Drainage catheter was present. Evaluate for colocutaneous fistula. EXAM: PERITONEOGRAM/HERNIOGRAM COMPARISON:  None. FINDINGS: Real-time fluoroscopy was performed of the right lower quadrant at the site of the pigtail catheter. 20 mL Isovue 300 contrast was hand injected through the pigtail catheter. No contrast pools around the pigtail catheter within the peritoneal cavity. No contrast is seen within the colon. Contrast traverses back around the catheter to the skin surface. IMPRESSION: 1. Right lower quadrant pigtail catheter. No contrast filling a cavity around the pigtail catheter. No colocutaneous fistula observed. Electronically Signed   By: Kathreen Devoid   On: 01/29/2018 09:25    Assessment and Plan:   TYRENE NADER is a 57 y.o. y/o female has been referred for a colonosocpy prior to appendectomy. She recently had appendicitis, s/p abscess and placement of a drain. Concern for a colo cutaeneous fistula , a contrast study performed today shows no fistula.   Plan  1. Colonoscopy on Friday if we can get coumadin holding instructions from her doctor, if not will schedule for colonoscopy next week    I have discussed alternative options, risks & benefits,  which include, but are not limited to, bleeding, infection, perforation,respiratory complication & drug reaction.  The patient agrees with this plan & written consent will be obtained.     Follow up PRN  Dr Jonathon Bellows MD,MRCP(U.K)

## 2018-01-29 NOTE — Telephone Encounter (Signed)
Faxed Brenda Santana  GI pt should stopped coumadin today and restart 24 after procedure and pt advised that she need to stopped coumadin today and restart 24after procedure and make appt in one week for pcp

## 2018-01-29 NOTE — Telephone Encounter (Signed)
-----   Message from Jules Husbands, MD sent at 01/29/2018  2:00 PM EDT ----- Please let her know that her xray was good. No fistula. We will likely remove the tube once she comes back to see [place]. ----- Message ----- From: Interface, Rad Results In Sent: 01/29/2018   9:28 AM EDT To: Jules Husbands, MD

## 2018-01-29 NOTE — Telephone Encounter (Signed)
Notified patient as instructed, patient pleased. Discussed follow-up appointments, patient agrees  

## 2018-01-29 NOTE — Progress Notes (Deleted)
Cephas Darby, MD [address]  [place]  [facility], [place] [address]  Main: [phone]  Fax: [phone]    Gastroenterology Consultation  Referring Provider:     Jules Husbands, MD Primary Care Physician:  Lavera Guise, MD Primary Gastroenterologist:  Dr. Cephas Darby Reason for Consultation:     H/o appendicular abscess        HPI:   Brenda Santana is a 57 y.o. female referred by Dr. Dahlia Byes for consultation & management of recent history of appendicular abscess status post drain placement.  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 She is referred here to discuss about colonoscopy prior to appendectomy Patient has metabolic syndrome  NSAIDs: ***  Antiplts/Anticoagulants/Anti thrombotics: ***  GI Procedures:  Colonoscopy 16 2014 at [facility] by Dr. Darrick Grinder Findings: The quality of bowel prep was adequate  Perianal examination was normal.  Many small-mouthed diverticula were found in the sigmoid colon and in   the ascending colon.  Non-bleeding internal hemorrhoids were found during retroflexion.  Past Medical History:  Diagnosis Date  . Asthma   . Atopic dermatitis   . Constipation   . COPD (chronic obstructive pulmonary disease) ([place])   . Diabetes mellitus without complication ([place])   . DVT (deep venous thrombosis) ([place])   . GERD (gastroesophageal reflux disease)   . Hyperlipidemia   . Hypertension   . Rheumatoid arthritis ([place])     Past Surgical History:  Procedure Laterality Date  . right arm surgery    . TUBAL LIGATION      Prior to Admission medications   Medication Sig Start Date End Date Taking? Authorizing Provider  acyclovir (ZOVIRAX) 400 MG tablet Take 1 tablet po TID for 10 days then take twice daily Patient taking differently: Take 400 mg by mouth 2 (two) times daily.  11/09/17   Ronnell Freshwater, NP  acyclovir ointment  (ZOVIRAX) 5 % Apply topically every 3 (three) hours. 11/09/17   Ronnell Freshwater, NP  albuterol (PROVENTIL HFA) 108 (90 Base) MCG/ACT inhaler  05/04/14   [provider]  allopurinol (ZYLOPRIM) 300 MG tablet TAKE ONE TAB AT NIGHT FOR GOUT 05/22/17   Ronnell Freshwater, NP  aspirin EC 81 MG tablet Take 81 mg by mouth daily.    [provider]  azithromycin (ZITHROMAX) 250 MG tablet TAKE 1 TABLET BY MOUTH EVERY DAY 10/17/17   Ronnell Freshwater, NP  Biotin 2.5 MG TABS Contains vit C and E 08/12/14   [provider]  Blood Glucose Monitoring Suppl (GLUCOCOM BLOOD GLUCOSE MONITOR) DEVI once daily. 05/25/11   [provider]  Catheters (BARDIC URO-SHEATH EXT CATHETER) MISC 1 Nebulizer  Dx: wheezing 11/27/12   [provider]  Cetirizine HCl (ZYRTEC ALLERGY) 10 MG CAPS Take by mouth.    [provider]  chlorthalidone (HYGROTON) 25 MG tablet TAKE 1 TABLET BY MOUTH DAILY EVERY MORNING 01/11/18   Kendell Bane, NP  clobetasol cream (TEMOVATE) 0.05 % Apply to hands and arm 1-2 times during the day. 08/22/13   [provider]  colchicine 0.6 MG tablet Take 1 tablet (0.6 mg total) by mouth daily. Take 2 tablets at onset of pain, then may take 1 more 1 hour later if pain continues. 09/20/15   Hagler, Jami L, PA-C  cyanocobalamin (,VITAMIN B-12,) 1000 MCG/ML injection Inject into the muscle.    [provider]  cyclobenzaprine (FLEXERIL) 5 MG tablet Take 1 tablet (5 mg total) by mouth 3 (three) times daily as needed for muscle spasms. 12/21/17   Vickie Epley, MD  diclofenac sodium (VOLTAREN) 1 % GEL  12/07/17   [provider]  dicyclomine (BENTYL) 10 MG capsule Take 1 capsule (10 mg total) by mouth 4 (four) times daily -  before meals and at bedtime. 11/28/17   Kendell Bane, NP  docusate sodium (COLACE) 100 MG capsule Take 3 caps daily for constipation 08/16/12   [provider]  doxycycline (VIBRA-TABS) 100 MG tablet TAKE 1  TABLET (100 MG TOTAL) BY MOUTH TWO (2) TIMES A DAY. 01/24/18   [provider]  famotidine (PEPCID) 20 MG tablet TAKE 1 TABLET BY MOUTH EVERY DAY 09/04/13   [provider]  Ferrous Sulfate (IRON) 325 (65 Fe) MG TABS Take 1 tablet by mouth daily.    [provider]  Fluticasone-Salmeterol (ADVAIR DISKUS) 500-50 MCG/DOSE AEPB TAKE 1 PUFF BY MOUTH TWICE A DAY 08/04/14   [provider]  gabapentin (NEURONTIN) 300 MG capsule Take 1 capsule (300 mg total) by mouth 3 (three) times daily. 12/21/17   Vickie Epley, MD  gabapentin (NEURONTIN) 800 MG tablet Take 800 mg by mouth 3 (three) times daily.    [provider]  glucose blood (ACCU-CHEK ACTIVE STRIPS) test strip once daily. 05/25/11   [provider]  HYDROcodone-acetaminophen (NORCO/VICODIN) 5-325 MG tablet Take 1 tablet by mouth every 6 (six) hours as needed for moderate pain. 12/06/17   Pabon, Diego F, MD  hydrocortisone (ANUSOL-HC) 25 MG suppository 25 mg daily as needed. 12/21/10   [provider]  hydroquinone 4 % cream Apply topically. 09/01/14   [provider]  INCRUSE ELLIPTA 62.5 MCG/INH AEPB TAKE 1 PUFF BY MOUTH EVERY DAY 12/18/17   Lavera Guise, MD  INSULIN DEGLUDEC-LIRAGLUTIDE [place] Inject into the skin.    [provider]  INVOKAMET 50-500 MG TABS TAKE 1 TABLET BY MOUTH TWICE A DAY 12/22/17   Scarboro, Audie Clear, NP  ipratropium-albuterol (DUONEB) 0.5-2.5 (3) MG/3ML SOLN Take 3 mLs by nebulization every 6 (six) hours as needed.    [provider]  Lancets MISC Frequency:QD   Dosage:0.0     Instructions:  Note:Dose: 1 01/26/10   [provider]  LINZESS 145 MCG CAPS capsule TAKE ONE CAPSULE BY MOUTH ONCE DAILY FOR CONSTIPATION 10/23/17   Lavera Guise, MD  lisinopril (PRINIVIL,ZESTRIL) 10 MG tablet TAKE 1 TAB DAILY IN MORNING 07/28/17   Ronnell Freshwater, NP  meclizine (ANTIVERT) 12.5 MG tablet TAKE 1 TABLET BY MOUTH TWICE A DAY AS NEEDED FOR DIZZINESS  03/08/16   [provider]  meloxicam (MOBIC) 7.5 MG tablet Take by mouth.    [provider]  metFORMIN (GLUCOPHAGE) 1000 MG tablet Take by mouth. 08/27/13   [provider]  metoprolol tartrate (LOPRESSOR) 50 MG tablet TAKE 1 TABLET BY MOUTH TWICE A DAY 10/19/17   Boscia, Heather E, NP  montelukast (SINGULAIR) 10 MG tablet TAKE 1 TABLET BY MOUTH DAILY FOR ASTHMA Patient taking differently: TAKE 1 TABLET BY MOUTH TWICE DAILY FOR ASTHMA 11/21/17   Ronnell Freshwater, NP  ondansetron (ZOFRAN) 4 MG tablet Take 1 tablet (4 mg total) by mouth every 8 (eight) hours as needed for nausea or vomiting. 11/28/17   Kendell Bane, NP  ondansetron (ZOFRAN-ODT) 4 MG disintegrating tablet Take 1 tablet (4 mg total) by mouth every 6 (six) hours  as needed for nausea. 12/06/17   Gouru, Illene Silver, MD  Peak Flow Meter (PERSONAL BEST FULL RANGE) DEVI Frequency:PHARMDIR   Dosage:0.0     Instructions:  Note:Please provide peak flow meter to be used for asthma/COPD monitoring Dose: 1 05/30/12   [provider]  phentermine 37.5 MG capsule Take 1 capsule (37.5 mg total) by mouth every morning. 11/09/17   Ronnell Freshwater, NP  predniSONE (DELTASONE) 5 MG tablet TAKE 2 TABLETS BY MOUTH EVERY DAY Patient taking differently: TAKE 20MG   BY MOUTH EVERY DAY 09/14/17   Lavera Guise, MD  Spacer/Aero-Holding Chambers (AEROCHAMBER MV) inhaler Use with albuterol every 4-6 hours as needed. 11/27/12   [provider]  Thermometer Sheaths (B-D THERMOMETER ORAL SHEATHS) MISC 1 tailbone donut for coccyx injury 03/11/14   [provider]  traMADol (ULTRAM) 50 MG tablet Take 50-100 mg by mouth every 8 (eight) hours as needed.     [provider]  tretinoin (RETIN-A) 0.025 % cream APPLY TO AFFECTED AREA AT NIGHT 09/25/17   [provider]  valACYclovir (VALTREX) 500 MG tablet  10/08/14   [provider]  VICTOZA 18 MG/3ML SOPN TAKE 1.8 MG EVERY MORNING 12/01/17   Lavera Guise,  MD  warfarin (COUMADIN) 5 MG tablet TAKE 1 AND 1/2 TABS (7.5) DAILY OR AS DIRECTED 09/12/17   [provider]  warfarin (COUMADIN) 7.5 MG tablet Take 7.5 mg by mouth daily.    [provider]    Family History  Family history unknown: Yes     Social History   Tobacco Use  . Smoking status: Former Research scientist (life sciences)  . Smokeless tobacco: Never Used  Substance Use Topics  . Alcohol use: No  . Drug use: No    Allergies as of 01/29/2018  . (No Known Allergies)    Review of Systems:    All systems reviewed and negative except where noted in HPI.   Physical Exam:  There were no vitals taken for this visit. No LMP recorded. (Menstrual status: Perimenopausal).  General:   Alert,  Well-developed, well-nourished, pleasant and cooperative in NAD Head:  Normocephalic and atraumatic. Eyes:  Sclera clear, no icterus.   Conjunctiva pink. Ears:  Normal auditory acuity. Nose:  No deformity, discharge, or lesions. Mouth:  No deformity or lesions,oropharynx pink & moist. Neck:  Supple; no masses or thyromegaly. Lungs:  Respirations even and unlabored.  Clear throughout to auscultation.   No wheezes, crackles, or rhonchi. No acute distress. Heart:  Regular rate and rhythm; no murmurs, clicks, rubs, or gallops. Abdomen:  Normal bowel sounds. Soft, non-tender and non-distended without masses, hepatosplenomegaly or hernias noted.  No guarding or rebound tenderness.   Rectal: Not performed Msk:  Symmetrical without gross deformities. Good, equal movement & strength bilaterally. Pulses:  Normal pulses noted. Extremities:  No clubbing or edema.  No cyanosis. Neurologic:  Alert and oriented x3;  grossly normal neurologically. Skin:  Intact without significant lesions or rashes. No jaundice. Lymph Nodes:  No significant cervical adenopathy. Psych:  Alert and cooperative. Normal mood and affect.  Imaging Studies: ***  Assessment and Plan:   Brenda Santana is a 57 y.o. female with  ***   Follow up in ***   Cephas Darby, MD

## 2018-01-30 ENCOUNTER — Other Ambulatory Visit: Payer: Self-pay | Admitting: Internal Medicine

## 2018-01-30 ENCOUNTER — Ambulatory Visit: Payer: Self-pay

## 2018-01-30 NOTE — Progress Notes (Signed)
Opened in error  -RV

## 2018-01-31 ENCOUNTER — Telehealth: Payer: Self-pay | Admitting: *Deleted

## 2018-01-31 ENCOUNTER — Encounter: Payer: Self-pay | Admitting: Surgery

## 2018-01-31 ENCOUNTER — Ambulatory Visit (INDEPENDENT_AMBULATORY_CARE_PROVIDER_SITE_OTHER): Payer: Medicare Other | Admitting: Surgery

## 2018-01-31 VITALS — BP 142/90 | HR 80 | Temp 97.7°F | Ht 65.0 in | Wt 235.0 lb

## 2018-01-31 DIAGNOSIS — K3532 Acute appendicitis with perforation and localized peritonitis, without abscess: Secondary | ICD-10-CM | POA: Diagnosis not present

## 2018-01-31 NOTE — Patient Instructions (Signed)
We have scheduled you for an elective Appendectomy on 02/05/2018. Please see the following information regarding your surgery.   Typically, our patients are out of work 1-2 weeks following the surgery and may return with a lifting restriction of no more than 15 lbs. For a complete 6 weeks following surgery. If you need [place] to fill out any FMLA or disability paperwork for your employer, please submit that to our office as soon as you can. This is completed within 3 days of your scheduled surgery and requires a $25.00 one time fee that can be paid over the phone or at our front desk.  Also, please review the [facility]) Pre-Care sheet for further information regarding your surgery. If you have any questions, please do not hesitate to contact our office.  Laparoscopic Appendectomy, Adult, Care After Refer to this sheet in the next few weeks. These instructions provide you with information about caring for yourself after your procedure. Your health care provider may also give you more specific instructions. Your treatment has been planned according to current medical practices, but problems sometimes occur. Call your health care provider if you have any problems or questions after your procedure. What can I expect after the procedure? After the procedure, it is common to have:  A decrease in your energy level.  Mild pain in the area where the surgical cuts (incisions) were made.  Constipation. This can be caused by pain medicine and a decrease in your activity.  Follow these instructions at home: Medicines  Take over-the-counter and prescription medicines only as told by your health care provider.  Do not drive for 24 hours if you received a sedative.  Do not drive or operate heavy machinery while taking prescription pain medicine.  If you were prescribed an antibiotic medicine, take it as told by your health care provider. Do not stop taking the antibiotic even if you start to feel  better. Activity  For 3 weeks or as long as told by your health care provider: ? Do not lift anything that is heavier than 10 pounds (4.5 kg). ? Do not play contact sports.  Gradually return to your normal activities. Ask your health care provider what activities are safe for you. Bathing  Keep your incisions clean and dry. Clean them as often as told by your health care provider: ? Gently wash the incisions with soap and water. ? Rinse the incisions with water to remove all soap. ? Pat the incisions dry with a clean towel. Do not rub the incisions.  You may take showers after 48 hours.  Do not take baths, swim, or use hot tubs for 2 weeks or as told by your health care provider. Incision care  Follow instructions from your healthcare provider about how to take care of your incisions. Make sure you: ? Wash your hands with soap and water before you change your bandage (dressing). If soap and water are not available, use hand sanitizer. ? Change your dressing as told by your health care provider. ? Leave stitches (sutures), skin glue, or adhesive strips in place. These skin closures may need to stay in place for 2 weeks or longer. If adhesive strip edges start to loosen and curl up, you may trim the loose edges. Do not remove adhesive strips completely unless your health care provider tells you to do that.  Check your incision areas every day for signs of infection. Check for: ? More redness, swelling, or pain. ? More fluid or blood. ? Warmth. ?  Pus or a bad smell. Other Instructions  If you were sent home with a drain, follow instructions from your health care provider about how to care for the drain and how to empty it.  Take deep breaths. This helps to prevent your lungs from becoming inflamed.  To relieve and prevent constipation: ? Drink plenty of fluids. ? Eat plenty of fruits and vegetables.  Keep all follow-up visits as told by your health care provider. This is  important. Contact a health care provider if:  You have more redness, swelling, or pain around an incision.  You have more fluid or blood coming from an incision.  Your incision feels warm to the touch.  You have pus or a bad smell coming from an incision or dressing.  Your incision edges break open after your sutures have been removed.  You have increasing pain in your shoulders.  You feel dizzy or you faint.  You develop shortness of breath.  You keep feeling nauseous or vomiting.  You have diarrhea or you cannot control your bowel functions.  You lose your appetite.  You develop swelling or pain in your legs. Get help right away if:  You have a fever.  You develop a rash.  You have difficulty breathing.  You have sharp pains in your chest. This information is not intended to replace advice given to you by your health care provider. Make sure you discuss any questions you have with your health care provider. Document Released: 04/18/2005 Document Revised: 09/18/2015 Document Reviewed: 10/06/2014 Elsevier Interactive Patient Education  2017 [employer].

## 2018-01-31 NOTE — H&P (View-Only) (Signed)
Outpatient Surgical Follow Up  01/31/2018  Brenda Santana is an 57 y.o. female.   Chief Complaint  Patient presents with  . Follow-up    fistulogram     HPI: 57 year old female with history of appendicitis requiring drain placement.  She developed a cold cutaneous fistula but now has significantly improved.  Fistulogram personally reviewed showing no evidence of a fistula.  There is scant drainage from the drain.  No fevers no chills she is taking p.o.  She will have a colonoscopy this Friday and is holding the Coumadin.  No shortness of breath no chest pain.   Past Medical History:  Diagnosis Date  . Asthma   . Atopic dermatitis   . Constipation   . COPD (chronic obstructive pulmonary disease) ([place])   . Diabetes mellitus without complication ([place])   . DVT (deep venous thrombosis) ([place])   . GERD (gastroesophageal reflux disease)   . Hyperlipidemia   . Hypertension   . Rheumatoid arthritis ([place])     Past Surgical History:  Procedure Laterality Date  . right arm surgery    . TUBAL LIGATION      Family History  Problem Relation Age of Onset  . Breast cancer Mother   . Hypertension Mother   . Diabetes Mother   . Hypertension Father   . Heart failure Father     Social History:  reports that she has quit smoking. She has never used smokeless tobacco. She reports that she does not drink alcohol or use drugs.  Allergies: No Known Allergies  Medications reviewed.    ROS Full ROS performed and is otherwise negative other than what is stated in HPI   BP (!) 142/90   Pulse 80   Temp 97.7 F (36.5 C) (Skin)   Ht 5\' 5"  (1.651 m)   Wt 235 lb (106.6 kg)   BMI 39.11 kg/m   Physical Exam  Constitutional: She appears well-developed and well-nourished.  Neck: Normal range of motion. Neck supple. No JVD present. No tracheal deviation present. No thyromegaly present.  Cardiovascular: Normal rate and regular rhythm.  Pulmonary/Chest: Effort normal and breath sounds  normal. No stridor. No respiratory distress. She has no wheezes.  Abdominal: Soft. She exhibits no distension and no mass. There is no tenderness. There is no rebound and no guarding.  Drain removed, no peritonitis  Musculoskeletal: Normal range of motion. She exhibits no edema.  Skin: Skin is warm and dry. Capillary refill takes less than 2 seconds.  Psychiatric: She has a normal mood and affect. Her behavior is normal. Judgment and thought content normal.  Nursing note and vitals reviewed.      Results for orders placed or performed in visit on 01/29/18 (from the past 48 hour(s))  POCT INR     Status: Abnormal   Collection Time: 01/29/18  2:28 PM  Result Value Ref Range   INR 3.2 (A) 2.0 - 3.0    Comment: Inr3.2 and Pt 38.1 as per heather pt going for procedure for colonscopy stopped coumadin  today and start back 24 hrs after procedure and follow up wek to see pcp    No results found.  Assessment/Plan: 57 year old female with a history of ruptured appendicitis requiring percutaneous drain placement. 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  Fistulogram revealing no evidence of fistula.  She will have her colonoscopy in the next few days and she wants to have her appendix removed as soon as possible.  We will tentatively place  her on the schedule next Monday if there is no major events during the colonoscopy. She discussed with the patient detail.  Risk benefit and possible implications including but not limited to: Bleeding, infection, injury to adjacent obstruction and conversion to open. Greater than 50% of the 25 minutes  visit was spent in counseling/coordination of care   Caroleen Hamman, MD [place] Surgeon

## 2018-01-31 NOTE — Telephone Encounter (Signed)
Patient's surgery has been scheduled for 02-05-18 at [facility] with Dr. Dahlia Byes.   The patient is aware of pre-admission appointment date and time.   The patient states that she is not currently taking coumadin since she is scheduled for a colonoscopy on Friday, 02-02-18. She is taking an 81 mg aspirin once daily and it is okay for her to continue.   Patient was instructed to call the office should she have further questions.

## 2018-01-31 NOTE — Progress Notes (Signed)
Outpatient Surgical Follow Up  01/31/2018  Brenda Santana is an 57 y.o. female.   Chief Complaint  Patient presents with  . Follow-up    fistulogram     HPI: 57 year old female with history of appendicitis requiring drain placement.  She developed a cold cutaneous fistula but now has significantly improved.  Fistulogram personally reviewed showing no evidence of a fistula.  There is scant drainage from the drain.  No fevers no chills she is taking p.o.  She will have a colonoscopy this Friday and is holding the Coumadin.  No shortness of breath no chest pain.   Past Medical History:  Diagnosis Date  . Asthma   . Atopic dermatitis   . Constipation   . COPD (chronic obstructive pulmonary disease) ([place])   . Diabetes mellitus without complication ([place])   . DVT (deep venous thrombosis) ([place])   . GERD (gastroesophageal reflux disease)   . Hyperlipidemia   . Hypertension   . Rheumatoid arthritis ([place])     Past Surgical History:  Procedure Laterality Date  . right arm surgery    . TUBAL LIGATION      Family History  Problem Relation Age of Onset  . Breast cancer Mother   . Hypertension Mother   . Diabetes Mother   . Hypertension Father   . Heart failure Father     Social History:  reports that she has quit smoking. She has never used smokeless tobacco. She reports that she does not drink alcohol or use drugs.  Allergies: No Known Allergies  Medications reviewed.    ROS Full ROS performed and is otherwise negative other than what is stated in HPI   BP (!) 142/90   Pulse 80   Temp 97.7 F (36.5 C) (Skin)   Ht 5\' 5"  (1.651 m)   Wt 235 lb (106.6 kg)   BMI 39.11 kg/m   Physical Exam  Constitutional: She appears well-developed and well-nourished.  Neck: Normal range of motion. Neck supple. No JVD present. No tracheal deviation present. No thyromegaly present.  Cardiovascular: Normal rate and regular rhythm.  Pulmonary/Chest: Effort normal and breath sounds  normal. No stridor. No respiratory distress. She has no wheezes.  Abdominal: Soft. She exhibits no distension and no mass. There is no tenderness. There is no rebound and no guarding.  Drain removed, no peritonitis  Musculoskeletal: Normal range of motion. She exhibits no edema.  Skin: Skin is warm and dry. Capillary refill takes less than 2 seconds.  Psychiatric: She has a normal mood and affect. Her behavior is normal. Judgment and thought content normal.  Nursing note and vitals reviewed.      Results for orders placed or performed in visit on 01/29/18 (from the past 48 hour(s))  POCT INR     Status: Abnormal   Collection Time: 01/29/18  2:28 PM  Result Value Ref Range   INR 3.2 (A) 2.0 - 3.0    Comment: Inr3.2 and Pt 38.1 as per heather pt going for procedure for colonscopy stopped coumadin  today and start back 24 hrs after procedure and follow up wek to see pcp    No results found.  Assessment/Plan: 57 year old female with a history of ruptured appendicitis requiring percutaneous drain placement. 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  Fistulogram revealing no evidence of fistula.  She will have her colonoscopy in the next few days and she wants to have her appendix removed as soon as possible.  We will tentatively place  her on the schedule next Monday if there is no major events during the colonoscopy. She discussed with the patient detail.  Risk benefit and possible implications including but not limited to: Bleeding, infection, injury to adjacent obstruction and conversion to open. Greater than 50% of the 25 minutes  visit was spent in counseling/coordination of care   Caroleen Hamman, MD [place] Surgeon

## 2018-02-01 ENCOUNTER — Encounter
Admission: RE | Admit: 2018-02-01 | Discharge: 2018-02-01 | Disposition: A | Discharge status: Home / Self Care | Payer: Medicare Other | Source: Ambulatory Visit | Attending: Surgery | Admitting: Surgery

## 2018-02-01 ENCOUNTER — Encounter: Payer: Self-pay | Admitting: *Deleted

## 2018-02-01 ENCOUNTER — Other Ambulatory Visit: Payer: Self-pay

## 2018-02-01 DIAGNOSIS — K635 Polyp of colon: Secondary | ICD-10-CM | POA: Diagnosis not present

## 2018-02-01 DIAGNOSIS — J449 Chronic obstructive pulmonary disease, unspecified: Secondary | ICD-10-CM | POA: Diagnosis not present

## 2018-02-01 DIAGNOSIS — E119 Type 2 diabetes mellitus without complications: Secondary | ICD-10-CM

## 2018-02-01 DIAGNOSIS — Z7982 Long term (current) use of aspirin: Secondary | ICD-10-CM | POA: Diagnosis not present

## 2018-02-01 DIAGNOSIS — I10 Essential (primary) hypertension: Secondary | ICD-10-CM | POA: Insufficient documentation

## 2018-02-01 DIAGNOSIS — Z7901 Long term (current) use of anticoagulants: Secondary | ICD-10-CM | POA: Diagnosis not present

## 2018-02-01 DIAGNOSIS — Z79899 Other long term (current) drug therapy: Secondary | ICD-10-CM | POA: Diagnosis not present

## 2018-02-01 DIAGNOSIS — Z86718 Personal history of other venous thrombosis and embolism: Secondary | ICD-10-CM | POA: Diagnosis not present

## 2018-02-01 DIAGNOSIS — M069 Rheumatoid arthritis, unspecified: Secondary | ICD-10-CM | POA: Diagnosis not present

## 2018-02-01 DIAGNOSIS — Z79891 Long term (current) use of opiate analgesic: Secondary | ICD-10-CM | POA: Diagnosis not present

## 2018-02-01 DIAGNOSIS — K640 First degree hemorrhoids: Secondary | ICD-10-CM | POA: Diagnosis not present

## 2018-02-01 DIAGNOSIS — K219 Gastro-esophageal reflux disease without esophagitis: Secondary | ICD-10-CM | POA: Diagnosis not present

## 2018-02-01 DIAGNOSIS — Z7952 Long term (current) use of systemic steroids: Secondary | ICD-10-CM | POA: Diagnosis not present

## 2018-02-01 DIAGNOSIS — K5730 Diverticulosis of large intestine without perforation or abscess without bleeding: Secondary | ICD-10-CM | POA: Diagnosis not present

## 2018-02-01 DIAGNOSIS — G4730 Sleep apnea, unspecified: Secondary | ICD-10-CM | POA: Diagnosis not present

## 2018-02-01 DIAGNOSIS — Z01818 Encounter for other preprocedural examination: Secondary | ICD-10-CM

## 2018-02-01 DIAGNOSIS — Z7984 Long term (current) use of oral hypoglycemic drugs: Secondary | ICD-10-CM | POA: Diagnosis not present

## 2018-02-01 DIAGNOSIS — Z87891 Personal history of nicotine dependence: Secondary | ICD-10-CM | POA: Diagnosis not present

## 2018-02-01 DIAGNOSIS — Z7951 Long term (current) use of inhaled steroids: Secondary | ICD-10-CM | POA: Diagnosis not present

## 2018-02-01 LAB — CBC
HCT: 43.3 % (ref 35.0–47.0)
Hemoglobin: 14.4 g/dL (ref 12.0–16.0)
MCH: 33.3 pg (ref 26.0–34.0)
MCHC: 33.3 g/dL (ref 32.0–36.0)
MCV: 99.9 fL (ref 80.0–100.0)
Platelets: 329 10*3/uL (ref 150–440)
RBC: 4.33 MIL/uL (ref 3.80–5.20)
RDW: 15.2 % — ABNORMAL HIGH (ref 11.5–14.5)
WBC: 12.1 10*3/uL — ABNORMAL HIGH (ref 3.6–11.0)

## 2018-02-01 LAB — BASIC METABOLIC PANEL
Anion gap: 9 (ref 5–15)
BUN: 23 mg/dL — ABNORMAL HIGH (ref 6–20)
CO2: 28 mmol/L (ref 22–32)
Calcium: 9.9 mg/dL (ref 8.9–10.3)
Chloride: 101 mmol/L (ref 98–111)
Creatinine, Ser: 0.79 mg/dL (ref 0.44–1.00)
GFR calc Af Amer: >60 mL/min (ref >60)
GFR calc non Af Amer: >60 mL/min (ref >60)
Glucose, Bld: 122 mg/dL — ABNORMAL HIGH (ref 70–99)
Potassium: 4 mmol/L (ref 3.5–5.1)
Sodium: 138 mmol/L (ref 135–145)

## 2018-02-01 MED ORDER — SODIUM CHLORIDE 0.9 % IV SOLN
1.0000 g | INTRAVENOUS | Status: DC
  Filled 2018-02-01: qty 1

## 2018-02-01 NOTE — Patient Instructions (Addendum)
Your procedure is scheduled on: Monday 02/05/18  Report to [place]. To find out your arrival time please call [phone] between 1PM - 3PM on Friday 02/02/18  Remember: Instructions that are not followed completely may result in serious medical risk, up to and including death, or upon the discretion of your surgeon and anesthesiologist your surgery may need to be rescheduled.     _X__ 1. Do not eat food after midnight the night before your procedure.                 No gum chewing or hard candies. You may drink SUGAR FREE clear liquids up to 2 hours                 before you are scheduled to arrive for your surgery- DO NOT drink clear                 liquids within 2 hours of the start of your surgery.                  __X__2.  On the morning of surgery brush your teeth with toothpaste and water, you may rinse your mouth with mouthwash if you wish.  Do not swallow any toothpaste or mouthwash.     _X__ 3.  No Alcohol for 24 hours before or after surgery.   _X__ 4.  Do Not Smoke or use e-cigarettes For 24 Hours Prior to Your Surgery.                 Do not use any chewable tobacco products for at least 6 hours prior to                 surgery.  ____  5.  Bring all medications with you on the day of surgery if instructed.   __X__  6.  Notify your doctor if there is any change in your medical condition      (cold, fever, infections).     Do not wear jewelry, make-up, hairpins, clips or nail polish. Do not wear lotions, powders, or perfumes.  Do not shave 48 hours prior to surgery. Men may shave face and neck. Do not bring valuables to the hospital.    [facility] is not responsible for any belongings or valuables.  Contacts, dentures/partials or body piercings may not be worn into surgery. Bring a case for your contacts, glasses or hearing aids, a denture cup will be supplied. Leave your suitcase in the car. After surgery it  may be brought to your room. For patients admitted to the hospital, discharge time is determined by your treatment team.   Patients discharged the day of surgery will not be allowed to drive home.   Please read over the following fact sheets that you were given:   MRSA Information  __X__ Take these medicines the morning of surgery with A SIP OF WATER:     1. acyclovir (ZOVIRAX) 400 MG tablet  2. albuterol (PROVENTIL HFA) 108 (90 Base) MCG/ACT inhaler  3. cetirizine (ZYRTEC ALLERGY) 10 MG tablet IF NEEDED  4. INCRUSE ELLIPTA 62.5 MCG/INH AEPB  5. doxycycline (VIBRA-TABS) 100 MG tablet  6. famotidine (PEPCID) 20 MG tablet TAKE MORNING OF AND NIGHT BEFORE  7. Fluticasone-Salmeterol (ADVAIR DISKUS) 500-50 MCG/DOSE AEPB  8. gabapentin (NEURONTIN) 800 MG tablet  9. ipratropium-albuterol (DUONEB) 0.5-2.5 (3) MG/3ML SOLN  10. metoprolol tartrate (LOPRESSOR) 50 MG tablet  11.  predniSONE (DELTASONE) 5 MG tablet  12. traMADol (ULTRAM) 50 MG tablet IF NEEDED     __X__ Use CHG Soap as directed  _ X___ Use inhalers on the day of surgery. Also bring the inhaler with you to the hospital on the morning of surgery. Bring a ipratropium-albuterol (DUONEB) 0.5-2.5 (3) MG/3ML SOLN vial too.     __X__ Take 1/2 of usual insulin dose the night before surgery. No insulin the morning          of surgery.   __X__ Stop Blood Thinners Coumadin/Plavix/Xarelto/Pleta/Pradaxa/Eliquis/Effient/Aspirin STOP TODAY  __X__ Stop Anti-inflammatories 7 days before surgery such as Advil, Ibuprofen, Motrin, BC or Goodies Powder, Naprosyn, Naproxen, Aleve, Aspirin, Meloxicam. May take Tylenol if needed for pain or discomfort. STOP ALL TODAY   __X__ Stop all herbal supplements

## 2018-02-02 ENCOUNTER — Ambulatory Visit: Payer: Medicare Other | Admitting: Anesthesiology

## 2018-02-02 ENCOUNTER — Ambulatory Visit
Admission: RE | Admit: 2018-02-02 | Discharge: 2018-02-02 | Disposition: A | Discharge status: Home / Self Care | Payer: Medicare Other | Source: Ambulatory Visit | Attending: Gastroenterology | Admitting: Gastroenterology

## 2018-02-02 ENCOUNTER — Encounter
Admission: RE | Disposition: A | Discharge status: Home / Self Care | Payer: Self-pay | Source: Ambulatory Visit | Attending: Gastroenterology

## 2018-02-02 DIAGNOSIS — Z7952 Long term (current) use of systemic steroids: Secondary | ICD-10-CM | POA: Insufficient documentation

## 2018-02-02 DIAGNOSIS — Z7901 Long term (current) use of anticoagulants: Secondary | ICD-10-CM | POA: Insufficient documentation

## 2018-02-02 DIAGNOSIS — K219 Gastro-esophageal reflux disease without esophagitis: Secondary | ICD-10-CM | POA: Insufficient documentation

## 2018-02-02 DIAGNOSIS — J449 Chronic obstructive pulmonary disease, unspecified: Secondary | ICD-10-CM | POA: Insufficient documentation

## 2018-02-02 DIAGNOSIS — I10 Essential (primary) hypertension: Secondary | ICD-10-CM | POA: Insufficient documentation

## 2018-02-02 DIAGNOSIS — Z7982 Long term (current) use of aspirin: Secondary | ICD-10-CM | POA: Insufficient documentation

## 2018-02-02 DIAGNOSIS — Z79899 Other long term (current) drug therapy: Secondary | ICD-10-CM | POA: Insufficient documentation

## 2018-02-02 DIAGNOSIS — M069 Rheumatoid arthritis, unspecified: Secondary | ICD-10-CM | POA: Insufficient documentation

## 2018-02-02 DIAGNOSIS — Z7951 Long term (current) use of inhaled steroids: Secondary | ICD-10-CM | POA: Insufficient documentation

## 2018-02-02 DIAGNOSIS — Z87891 Personal history of nicotine dependence: Secondary | ICD-10-CM | POA: Insufficient documentation

## 2018-02-02 DIAGNOSIS — Z7984 Long term (current) use of oral hypoglycemic drugs: Secondary | ICD-10-CM | POA: Insufficient documentation

## 2018-02-02 DIAGNOSIS — K5730 Diverticulosis of large intestine without perforation or abscess without bleeding: Secondary | ICD-10-CM | POA: Insufficient documentation

## 2018-02-02 DIAGNOSIS — D122 Benign neoplasm of ascending colon: Secondary | ICD-10-CM

## 2018-02-02 DIAGNOSIS — Z01818 Encounter for other preprocedural examination: Secondary | ICD-10-CM | POA: Diagnosis not present

## 2018-02-02 DIAGNOSIS — K3532 Acute appendicitis with perforation and localized peritonitis, without abscess: Secondary | ICD-10-CM | POA: Diagnosis not present

## 2018-02-02 DIAGNOSIS — K640 First degree hemorrhoids: Secondary | ICD-10-CM | POA: Insufficient documentation

## 2018-02-02 DIAGNOSIS — E119 Type 2 diabetes mellitus without complications: Secondary | ICD-10-CM | POA: Insufficient documentation

## 2018-02-02 DIAGNOSIS — Z86718 Personal history of other venous thrombosis and embolism: Secondary | ICD-10-CM | POA: Insufficient documentation

## 2018-02-02 DIAGNOSIS — K632 Fistula of intestine: Secondary | ICD-10-CM

## 2018-02-02 DIAGNOSIS — G4730 Sleep apnea, unspecified: Secondary | ICD-10-CM | POA: Insufficient documentation

## 2018-02-02 DIAGNOSIS — Z79891 Long term (current) use of opiate analgesic: Secondary | ICD-10-CM | POA: Insufficient documentation

## 2018-02-02 DIAGNOSIS — K635 Polyp of colon: Secondary | ICD-10-CM | POA: Insufficient documentation

## 2018-02-02 HISTORY — PX: COLONOSCOPY WITH PROPOFOL: SHX5780

## 2018-02-02 LAB — PROTIME-INR
INR: 1.12
Prothrombin Time: 14.3 seconds (ref 11.4–15.2)

## 2018-02-02 LAB — GLUCOSE, CAPILLARY: Glucose-Capillary: 105 mg/dL — ABNORMAL HIGH (ref 70–99)

## 2018-02-02 SURGERY — COLONOSCOPY WITH PROPOFOL
Anesthesia: General

## 2018-02-02 MED ORDER — PROPOFOL 10 MG/ML IV BOLUS
INTRAVENOUS | Status: AC
  Filled 2018-02-02: qty 20

## 2018-02-02 MED ORDER — SODIUM CHLORIDE 0.9 % IV SOLN
INTRAVENOUS | Status: DC
  Administered 2018-02-02: 11:00:00 via INTRAVENOUS

## 2018-02-02 MED ORDER — PROPOFOL 500 MG/50ML IV EMUL
INTRAVENOUS | Status: AC
  Filled 2018-02-02: qty 50

## 2018-02-02 MED ORDER — PROPOFOL 500 MG/50ML IV EMUL
INTRAVENOUS | Status: DC | PRN
  Administered 2018-02-02: 125 ug/kg/min via INTRAVENOUS

## 2018-02-02 MED ORDER — PROPOFOL 10 MG/ML IV BOLUS
INTRAVENOUS | Status: DC | PRN
  Administered 2018-02-02: 50 mg via INTRAVENOUS

## 2018-02-02 MED ORDER — LIDOCAINE HCL (PF) 2 % IJ SOLN
INTRAMUSCULAR | Status: DC | PRN
  Administered 2018-02-02: 100 mg via INTRADERMAL

## 2018-02-02 NOTE — Transfer of Care (Signed)
Immediate Anesthesia Transfer of Care Note  Patient: LOUIS GAW  Procedure(s) Performed: COLONOSCOPY WITH PROPOFOL (N/A )  Patient Location: PACU  Anesthesia Type:General  Level of Consciousness: sedated  Airway & Oxygen Therapy: Patient Spontanous Breathing and Patient connected to nasal cannula oxygen  Post-op Assessment: Report given to RN and Post -op Vital signs reviewed and stable  Post vital signs: Reviewed and stable  Last Vitals:  Vitals Value Taken Time  BP 119/77 02/02/2018 11:40 AM  Temp    Pulse 66 02/02/2018 11:41 AM  Resp 21 02/02/2018 11:41 AM  SpO2 98 % 02/02/2018 11:41 AM  Vitals shown include unvalidated device data.  Last Pain:  Vitals:   02/02/18 0947  PainSc: 0-No pain         Complications: No apparent anesthesia complications

## 2018-02-02 NOTE — Anesthesia Post-op Follow-up Note (Signed)
Anesthesia QCDR form completed.        

## 2018-02-02 NOTE — Anesthesia Preprocedure Evaluation (Addendum)
Anesthesia Evaluation  Patient identified by MRN, date of birth, ID band Patient awake    Reviewed: Allergy & Precautions, H&P , NPO status , Patient's Chart, lab work & pertinent test results  Airway Mallampati: III  TM Distance: >3 FB Neck ROM: full    Dental  (+) Missing   Pulmonary asthma , sleep apnea , COPD,  COPD inhaler, former smoker,    breath sounds clear to auscultation       Cardiovascular hypertension,  Rhythm:regular Rate:Normal  H/o DVT on chronic anticoagulation   Neuro/Psych PSYCHIATRIC DISORDERS Depression negative neurological ROS     GI/Hepatic Neg liver ROS, GERD  ,  Endo/Other  diabetes  Renal/GU negative Renal ROS  negative genitourinary   Musculoskeletal  (+) Arthritis ,   Abdominal   Peds  Hematology negative hematology ROS (+)   Anesthesia Other Findings Past Medical History: No date: Asthma No date: Atopic dermatitis No date: Constipation No date: COPD (chronic obstructive pulmonary disease) (HCC) No date: Diabetes mellitus without complication (HCC) No date: DVT (deep venous thrombosis) (HCC) No date: GERD (gastroesophageal reflux disease) No date: Hyperlipidemia No date: Hypertension No date: Rheumatoid arthritis ([place])  Past Surgical History: No date: right arm surgery No date: TUBAL LIGATION  BMI    Body Mass Index:  39.11 kg/m      Reproductive/Obstetrics negative OB ROS                           Anesthesia Physical Anesthesia Plan  ASA: III  Anesthesia Plan: General   Post-op Pain Management:    Induction:   PONV Risk Score and Plan: Propofol infusion and TIVA  Airway Management Planned:   Additional Equipment:   Intra-op Plan:   Post-operative Plan:   Informed Consent: I have reviewed the patients History and Physical, chart, labs and discussed the procedure including the risks, benefits and alternatives for the proposed  anesthesia with the patient or authorized representative who has indicated his/her understanding and acceptance.   Dental Advisory Given  Plan Discussed with: Anesthesiologist, CRNA and Surgeon  Anesthesia Plan Comments:         Anesthesia Quick Evaluation

## 2018-02-02 NOTE — Op Note (Signed)
[facility] Patient Name: Brenda Santana Procedure Date: 02/02/2018 10:56 AM MRN: [id number] Account #: [id number] Date of Birth: April 10, 1961 Admit Type: Outpatient Age: 57 Room: [facility] ENDO ROOM 4 Gender: Female Note Status: Finalized Procedure:            Colonoscopy Indications:          Preoperative assessment Providers:            Jonathon Bellows MD, MD Referring MD:         Lavera Guise, MD (Referring MD) Medicines:            Monitored Anesthesia Care Complications:        No immediate complications. Procedure:            Pre-Anesthesia Assessment:                       - Prior to the procedure, a History and Physical was                        performed, and patient medications, allergies and                        sensitivities were reviewed. The patient's tolerance of                        previous anesthesia was reviewed.                       - The risks and benefits of the procedure and the                        sedation options and risks were discussed with the                        patient. All questions were answered and informed                        consent was obtained.                       - ASA Grade Assessment: II - A patient with mild                        systemic disease.                       After obtaining informed consent, the colonoscope was                        passed under direct vision. Throughout the procedure,                        the patient's blood pressure, pulse, and oxygen                        saturations were monitored continuously. The                        Colonoscope was introduced through the anus and                        advanced  to the the cecum, identified by the                        appendiceal orifice, IC valve and transillumination.                        The colonoscopy was performed with ease. The patient                        tolerated the procedure well. The quality of the bowel                       preparation was good. Findings:      The perianal and digital rectal examinations were normal.      Non-bleeding internal hemorrhoids were found during retroflexion. The       hemorrhoids were large and Grade I (internal hemorrhoids that do not       prolapse).      Multiple small and large-mouthed diverticula were found in the entire       colon.      A 3 mm polyp was found in the proximal ascending colon. The polyp was       sessile. The polyp was removed with a cold biopsy forceps. Resection and       retrieval were complete.      The exam was otherwise without abnormality on direct and retroflexion       views. Impression:           - Non-bleeding internal hemorrhoids.                       - Diverticulosis in the entire examined colon.                       - One 3 mm polyp in the proximal ascending colon,                        removed with a cold biopsy forceps. Resected and                        retrieved.                       - The examination was otherwise normal on direct and                        retroflexion views. Recommendation:       - Discharge patient to home (with escort).                       - Resume previous diet.                       - Continue present medications.                       - Await pathology results.                       - Repeat colonoscopy in 5-10 years for surveillance                        based on pathology  results. Procedure Code(s):    --- Professional ---                       [phone], Colonoscopy, flexible; with biopsy, single or                        multiple Diagnosis Code(s):    --- Professional ---                       K64.0, First degree hemorrhoids                       D12.2, Benign neoplasm of ascending colon                       Z01.818, Encounter for other preprocedural examination                       K57.30, Diverticulosis of large intestine without                        perforation or abscess  without bleeding CPT copyright 2017 American Medical Association. All rights reserved. The codes documented in this report are preliminary and upon coder review may  be revised to meet current compliance requirements. Jonathon Bellows, MD Jonathon Bellows MD, MD 02/02/2018 11:37:28 AM This report has been signed electronically. Number of Addenda: 0 Note Initiated On: 02/02/2018 10:56 AM Scope Withdrawal Time: 0 hours 14 minutes 10 seconds  Total Procedure Duration: 0 hours 16 minutes 30 seconds       [facility]

## 2018-02-02 NOTE — Anesthesia Postprocedure Evaluation (Signed)
Anesthesia Post Note  Patient: Brenda Santana  Procedure(s) Performed: COLONOSCOPY WITH PROPOFOL (N/A )  Patient location during evaluation: PACU Anesthesia Type: General Level of consciousness: awake and alert Pain management: pain level controlled Vital Signs Assessment: post-procedure vital signs reviewed and stable Respiratory status: spontaneous breathing, nonlabored ventilation and respiratory function stable Cardiovascular status: blood pressure returned to baseline and stable Postop Assessment: no apparent nausea or vomiting Anesthetic complications: no     Last Vitals:  Vitals:   02/02/18 1159 02/02/18 1202  BP:  128/74  Pulse: 69 72  Resp: 17 (!) 22  Temp:    SpO2: 95% 99%    Last Pain:  Vitals:   02/02/18 1202  PainSc: 0-No pain                 Durenda Hurt

## 2018-02-02 NOTE — H&P (Signed)
Jonathon Bellows, MD [address], [place], [place], [place], [address] [address], [place], [place], [place], [address] Phone: [phone]  Fax: [phone]  Primary Care Physician:  Lavera Guise, MD   Pre-Procedure History & Physical: HPI:  Brenda Santana is a 57 y.o. female is here for an colonoscopy.   Past Medical History:  Diagnosis Date  . Asthma   . Atopic dermatitis   . Constipation   . COPD (chronic obstructive pulmonary disease) ([place])   . Diabetes mellitus without complication ([place])   . DVT (deep venous thrombosis) ([place])   . GERD (gastroesophageal reflux disease)   . Hyperlipidemia   . Hypertension   . Rheumatoid arthritis ([place])     Past Surgical History:  Procedure Laterality Date  . right arm surgery    . TUBAL LIGATION      Prior to Admission medications   Medication Sig Start Date End Date Taking? Authorizing Provider  acyclovir (ZOVIRAX) 400 MG tablet Take 400 mg by mouth 2 (two) times daily.    [provider]  albuterol (PROVENTIL HFA) 108 (90 Base) MCG/ACT inhaler Inhale 2 puffs into the lungs every 6 (six) hours as needed for wheezing or shortness of breath.  05/04/14   [provider]  allopurinol (ZYLOPRIM) 300 MG tablet TAKE ONE TAB AT NIGHT FOR GOUT Patient taking differently: Take 300 mg by mouth at bedtime.  05/22/17   Ronnell Freshwater, NP  aspirin EC 81 MG tablet Take 81 mg by mouth daily.    [provider]  BD PEN NEEDLE NANO U/F 32G X 4 MM MISC USE DAILY WITH VICTOZA Patient not taking: Reported on 01/31/2018 01/30/18   Lavera Guise, MD  Biotin 5 MG CAPS Take 5 mg by mouth daily.  08/12/14   [provider]  cetirizine (ZYRTEC ALLERGY) 10 MG tablet Take 10 mg by mouth daily as needed for allergies.     [provider]  chlorthalidone (HYGROTON) 25 MG tablet TAKE 1 TABLET BY MOUTH DAILY EVERY MORNING Patient taking differently: Take 12.5 mg by mouth daily.  01/11/18   Kendell Bane,  NP  clobetasol cream (TEMOVATE) 5.95 % Apply 1 application topically 2 (two) times daily as needed (for eczema on hands).  08/22/13   [provider]  colchicine 0.6 MG tablet Take 1 tablet (0.6 mg total) by mouth daily. Take 2 tablets at onset of pain, then may take 1 more 1 hour later if pain continues. Patient not taking: Reported on 01/31/2018 09/20/15   Hagler, Jami L, PA-C  cyclobenzaprine (FLEXERIL) 5 MG tablet Take 1 tablet (5 mg total) by mouth 3 (three) times daily as needed for muscle spasms. Patient not taking: Reported on 01/31/2018 12/21/17   Vickie Epley, MD  dicyclomine (BENTYL) 10 MG capsule Take 1 capsule (10 mg total) by mouth 4 (four) times daily -  before meals and at bedtime. Patient not taking: Reported on 01/31/2018 11/28/17   Kendell Bane, NP  docusate sodium (COLACE) 100 MG capsule Take 100 mg by mouth daily.     [provider]  doxycycline (VIBRA-TABS) 100 MG tablet Take 100 mg by mouth 2 (two) times daily.  01/24/18   [provider]  famotidine (PEPCID) 20 MG tablet Take 20 mg by mouth daily as needed for heartburn or indigestion.  09/04/13   [provider]  Ferrous Sulfate (IRON) 325 (65 Fe) MG TABS Take 325  mg by mouth 3 (three) times daily.     [provider]  Fluticasone-Salmeterol (ADVAIR DISKUS) 500-50 MCG/DOSE AEPB Inhale 1 puff into the lungs 2 (two) times daily as needed (for shortness of breath or wheezing).  08/04/14   [provider]  gabapentin (NEURONTIN) 800 MG tablet Take 800 mg by mouth 3 (three) times daily as needed (for pain).     [provider]  INCRUSE ELLIPTA 62.5 MCG/INH AEPB TAKE 1 PUFF BY MOUTH EVERY DAY 12/18/17   Lavera Guise, MD  INVOKAMET 50-500 MG TABS TAKE 1 TABLET BY MOUTH TWICE A DAY Patient taking differently: Take 1 tablet by mouth 2 (two) times daily.  12/22/17   Scarboro, Audie Clear, NP  ipratropium-albuterol (DUONEB) 0.5-2.5 (3) MG/3ML SOLN Take 3 mLs by nebulization every 6  (six) hours as needed (for shortness of breath or wheezing).     [provider]  LINZESS 145 MCG CAPS capsule TAKE ONE CAPSULE BY MOUTH ONCE DAILY FOR CONSTIPATION Patient taking differently: Take 145 mcg by mouth daily before breakfast.  10/23/17   Lavera Guise, MD  lisinopril (PRINIVIL,ZESTRIL) 10 MG tablet TAKE 1 TAB DAILY IN MORNING Patient taking differently: Take 10 mg by mouth daily.  07/28/17   Ronnell Freshwater, NP  metoprolol tartrate (LOPRESSOR) 50 MG tablet TAKE 1 TABLET BY MOUTH TWICE A DAY Patient taking differently: Take 50 mg by mouth 2 (two) times daily.  10/19/17   Boscia, Greer Ee, NP  montelukast (SINGULAIR) 10 MG tablet TAKE 1 TABLET BY MOUTH DAILY FOR ASTHMA Patient taking differently: Take 10 mg by mouth daily.  11/21/17   Ronnell Freshwater, NP  oxymetazoline (AFRIN) 0.05 % nasal spray Place 1 spray into both nostrils 2 (two) times daily as needed for congestion.    [provider]  phentermine 37.5 MG capsule Take 1 capsule (37.5 mg total) by mouth every morning. 11/09/17   Ronnell Freshwater, NP  predniSONE (DELTASONE) 5 MG tablet TAKE 2 TABLETS BY MOUTH EVERY DAY Patient taking differently: Take 15 mg by mouth daily with breakfast.  09/14/17   Lavera Guise, MD  traMADol (ULTRAM) 50 MG tablet Take 50 mg by mouth every 8 (eight) hours as needed for moderate pain.     [provider]  tretinoin (RETIN-A) 0.025 % cream Apply 1 application topically at bedtime as needed (for eczema on face).  09/25/17   [provider]  VICTOZA 18 MG/3ML SOPN TAKE 1.8 MG EVERY MORNING Patient taking differently: Inject 1.8 mg into the skin daily.  12/01/17   Lavera Guise, MD  warfarin (COUMADIN) 5 MG tablet Take 7.5 mg by mouth See admin instructions. Take 7.5 mg by mouth daily except do not take ANY on Tuesday. 09/12/17   [provider]    Allergies as of 01/29/2018  . (No Known Allergies)    Family History  Problem Relation Age of Onset  .  Breast cancer Mother   . Hypertension Mother   . Diabetes Mother   . Hypertension Father   . Heart failure Father     Social History   Socioeconomic History  . Marital status: Single    Spouse name: Not on file  . Number of children: Not on file  . Years of education: Not on file  . Highest education level: Not on file  Occupational History  . Not on file  Social Needs  . Financial resource strain: Not on file  . Food insecurity:  Worry: Not on file    Inability: Not on file  . Transportation needs:    Medical: Not on file    Non-medical: Not on file  Tobacco Use  . Smoking status: Former Research scientist (life sciences)  . Smokeless tobacco: Never Used  Substance and Sexual Activity  . Alcohol use: No  . Drug use: No  . Sexual activity: Yes  Lifestyle  . Physical activity:    Days per week: Not on file    Minutes per session: Not on file  . Stress: Not on file  Relationships  . Social connections:    Talks on phone: Not on file    Gets together: Not on file    Attends religious service: Not on file    Active member of club or organization: Not on file    Attends meetings of clubs or organizations: Not on file    Relationship status: Not on file  . Intimate partner violence:    Fear of current or ex partner: Not on file    Emotionally abused: Not on file    Physically abused: Not on file    Forced sexual activity: Not on file  Other Topics Concern  . Not on file  Social History Narrative  . Not on file    Review of Systems: See HPI, otherwise negative ROS  Physical Exam: BP (!) 112/93   Pulse 84   Temp (!) 97.1 F (36.2 C)   Resp 17   Ht 5\' 5"  (1.651 m)   Wt 106.6 kg   SpO2 97%   BMI 39.11 kg/m  General:   Alert,  pleasant and cooperative in NAD Head:  Normocephalic and atraumatic. Neck:  Supple; no masses or thyromegaly. Lungs:  Clear throughout to auscultation, normal respiratory effort.    Heart:  +S1, +S2, Regular rate and rhythm, No edema. Abdomen:  Soft,  nontender and nondistended. Normal bowel sounds, without guarding, and without rebound.   Neurologic:  Alert and  oriented x4;  grossly normal neurologically.  Impression/Plan: Brenda Santana is here for an colonoscopy to be performed  Prior to appendectomy to r/o underlying neoplasm.  Risks, benefits, limitations, and alternatives regarding  colonoscopy have been reviewed with the patient.  Questions have been answered.  All parties agreeable.   Jonathon Bellows, MD  02/02/2018, 10:55 AM

## 2018-02-03 ENCOUNTER — Encounter: Payer: Self-pay | Admitting: Gastroenterology

## 2018-02-05 ENCOUNTER — Encounter
Admission: RE | Disposition: A | Discharge status: Home / Self Care | Payer: Self-pay | Source: Ambulatory Visit | Attending: Surgery

## 2018-02-05 ENCOUNTER — Ambulatory Visit
Admission: RE | Admit: 2018-02-05 | Discharge: 2018-02-05 | Disposition: A | Discharge status: Home / Self Care | Payer: Medicare Other | Source: Ambulatory Visit | Attending: Surgery | Admitting: Surgery

## 2018-02-05 ENCOUNTER — Ambulatory Visit: Payer: Medicare Other | Admitting: Certified Registered Nurse Anesthetist

## 2018-02-05 ENCOUNTER — Encounter: Payer: Self-pay | Admitting: Certified Registered Nurse Anesthetist

## 2018-02-05 ENCOUNTER — Other Ambulatory Visit: Payer: Self-pay

## 2018-02-05 DIAGNOSIS — J449 Chronic obstructive pulmonary disease, unspecified: Secondary | ICD-10-CM | POA: Diagnosis not present

## 2018-02-05 DIAGNOSIS — Z87891 Personal history of nicotine dependence: Secondary | ICD-10-CM | POA: Insufficient documentation

## 2018-02-05 DIAGNOSIS — K3532 Acute appendicitis with perforation and localized peritonitis, without abscess: Secondary | ICD-10-CM

## 2018-02-05 DIAGNOSIS — Z9989 Dependence on other enabling machines and devices: Secondary | ICD-10-CM | POA: Insufficient documentation

## 2018-02-05 DIAGNOSIS — Z7984 Long term (current) use of oral hypoglycemic drugs: Secondary | ICD-10-CM | POA: Insufficient documentation

## 2018-02-05 DIAGNOSIS — K219 Gastro-esophageal reflux disease without esophagitis: Secondary | ICD-10-CM | POA: Insufficient documentation

## 2018-02-05 DIAGNOSIS — E119 Type 2 diabetes mellitus without complications: Secondary | ICD-10-CM | POA: Diagnosis not present

## 2018-02-05 DIAGNOSIS — I10 Essential (primary) hypertension: Secondary | ICD-10-CM | POA: Insufficient documentation

## 2018-02-05 DIAGNOSIS — G4730 Sleep apnea, unspecified: Secondary | ICD-10-CM | POA: Diagnosis not present

## 2018-02-05 DIAGNOSIS — K3533 Acute appendicitis with perforation and localized peritonitis, with abscess: Secondary | ICD-10-CM | POA: Insufficient documentation

## 2018-02-05 DIAGNOSIS — Z86718 Personal history of other venous thrombosis and embolism: Secondary | ICD-10-CM | POA: Insufficient documentation

## 2018-02-05 DIAGNOSIS — K37 Unspecified appendicitis: Secondary | ICD-10-CM | POA: Diagnosis present

## 2018-02-05 HISTORY — PX: LAPAROSCOPIC APPENDECTOMY: SHX408

## 2018-02-05 LAB — GLUCOSE, CAPILLARY
Glucose-Capillary: 116 mg/dL — ABNORMAL HIGH (ref 70–99)
Glucose-Capillary: 116 mg/dL — ABNORMAL HIGH (ref 70–99)

## 2018-02-05 LAB — POCT PREGNANCY, URINE: Preg Test, Ur: NEGATIVE

## 2018-02-05 SURGERY — APPENDECTOMY, LAPAROSCOPIC
Anesthesia: General

## 2018-02-05 MED ORDER — SODIUM CHLORIDE 0.9 % IV SOLN
1.0000 g | Freq: Once | INTRAVENOUS | Status: AC
  Administered 2018-02-05: 1 g via INTRAVENOUS
  Filled 2018-02-05: qty 1

## 2018-02-05 MED ORDER — ROCURONIUM BROMIDE 100 MG/10ML IV SOLN
INTRAVENOUS | Status: DC | PRN
  Administered 2018-02-05: 50 mg via INTRAVENOUS

## 2018-02-05 MED ORDER — SODIUM CHLORIDE 0.9 % IV SOLN
INTRAVENOUS | Status: DC
  Administered 2018-02-05: 11:00:00 via INTRAVENOUS

## 2018-02-05 MED ORDER — FENTANYL CITRATE (PF) 100 MCG/2ML IJ SOLN
25.0000 ug | INTRAMUSCULAR | Status: AC | PRN
  Administered 2018-02-05 (×4): 25 ug via INTRAVENOUS

## 2018-02-05 MED ORDER — GABAPENTIN 300 MG PO CAPS
ORAL_CAPSULE | ORAL | Status: AC
  Administered 2018-02-05: 300 mg via ORAL
  Filled 2018-02-05: qty 1

## 2018-02-05 MED ORDER — ACETAMINOPHEN 500 MG PO TABS
ORAL_TABLET | ORAL | Status: AC
  Administered 2018-02-05: 1000 mg via ORAL
  Filled 2018-02-05: qty 2

## 2018-02-05 MED ORDER — SUGAMMADEX SODIUM 200 MG/2ML IV SOLN
INTRAVENOUS | Status: DC | PRN
  Administered 2018-02-05: 426.4 mg via INTRAVENOUS

## 2018-02-05 MED ORDER — MIDAZOLAM HCL 2 MG/2ML IJ SOLN
INTRAMUSCULAR | Status: AC
  Filled 2018-02-05: qty 2

## 2018-02-05 MED ORDER — PROPOFOL 10 MG/ML IV BOLUS
INTRAVENOUS | Status: AC
  Filled 2018-02-05: qty 20

## 2018-02-05 MED ORDER — PROPOFOL 10 MG/ML IV BOLUS
INTRAVENOUS | Status: DC | PRN
  Administered 2018-02-05: 200 mg via INTRAVENOUS

## 2018-02-05 MED ORDER — BUPIVACAINE-EPINEPHRINE (PF) 0.25% -1:200000 IJ SOLN
INTRAMUSCULAR | Status: AC
  Filled 2018-02-05: qty 30

## 2018-02-05 MED ORDER — FENTANYL CITRATE (PF) 100 MCG/2ML IJ SOLN
INTRAMUSCULAR | Status: AC
  Administered 2018-02-05: 25 ug via INTRAVENOUS
  Filled 2018-02-05: qty 2

## 2018-02-05 MED ORDER — FENTANYL CITRATE (PF) 100 MCG/2ML IJ SOLN
INTRAMUSCULAR | Status: AC
  Filled 2018-02-05: qty 2

## 2018-02-05 MED ORDER — ACETAMINOPHEN 10 MG/ML IV SOLN
INTRAVENOUS | Status: AC
  Filled 2018-02-05: qty 100

## 2018-02-05 MED ORDER — CELECOXIB 200 MG PO CAPS
200.0000 mg | ORAL_CAPSULE | ORAL | Status: AC
  Administered 2018-02-05: 200 mg via ORAL

## 2018-02-05 MED ORDER — GLYCOPYRROLATE 0.2 MG/ML IJ SOLN
INTRAMUSCULAR | Status: DC | PRN
  Administered 2018-02-05: 0.1 mg via INTRAVENOUS

## 2018-02-05 MED ORDER — CHLORHEXIDINE GLUCONATE CLOTH 2 % EX PADS: 6.0000 | MEDICATED_PAD | Freq: Once | CUTANEOUS | Status: DC

## 2018-02-05 MED ORDER — ONDANSETRON HCL 4 MG/2ML IJ SOLN
INTRAMUSCULAR | Status: DC | PRN
  Administered 2018-02-05: 4 mg via INTRAVENOUS

## 2018-02-05 MED ORDER — LACTATED RINGERS IV SOLN
INTRAVENOUS | Status: DC | PRN
  Administered 2018-02-05: 12:00:00 via INTRAVENOUS

## 2018-02-05 MED ORDER — HYDROCODONE-ACETAMINOPHEN 5-325 MG PO TABS: 1.0000 | ORAL_TABLET | Freq: Four times a day (QID) | ORAL | 0 refills | Status: DC | PRN

## 2018-02-05 MED ORDER — FAMOTIDINE 20 MG PO TABS
20.0000 mg | ORAL_TABLET | Freq: Once | ORAL | Status: AC
  Administered 2018-02-05: 20 mg via ORAL

## 2018-02-05 MED ORDER — GABAPENTIN 300 MG PO CAPS
300.0000 mg | ORAL_CAPSULE | ORAL | Status: AC
  Administered 2018-02-05: 300 mg via ORAL

## 2018-02-05 MED ORDER — ACETAMINOPHEN 500 MG PO TABS
1000.0000 mg | ORAL_TABLET | ORAL | Status: AC
  Administered 2018-02-05: 1000 mg via ORAL

## 2018-02-05 MED ORDER — CELECOXIB 200 MG PO CAPS
ORAL_CAPSULE | ORAL | Status: AC
  Administered 2018-02-05: 200 mg via ORAL
  Filled 2018-02-05: qty 1

## 2018-02-05 MED ORDER — BUPIVACAINE-EPINEPHRINE 0.25% -1:200000 IJ SOLN
INTRAMUSCULAR | Status: DC | PRN
  Administered 2018-02-05: 20 mL

## 2018-02-05 MED ORDER — FENTANYL CITRATE (PF) 100 MCG/2ML IJ SOLN
INTRAMUSCULAR | Status: DC | PRN
  Administered 2018-02-05: 100 ug via INTRAVENOUS

## 2018-02-05 MED ORDER — MIDAZOLAM HCL 2 MG/2ML IJ SOLN
INTRAMUSCULAR | Status: DC | PRN
  Administered 2018-02-05: 2 mg via INTRAVENOUS

## 2018-02-05 MED ORDER — LIDOCAINE HCL (CARDIAC) PF 100 MG/5ML IV SOSY
PREFILLED_SYRINGE | INTRAVENOUS | Status: DC | PRN
  Administered 2018-02-05: 100 mg via INTRAVENOUS

## 2018-02-05 MED ORDER — HYDROMORPHONE HCL 1 MG/ML IJ SOLN: 0.5000 mg | INTRAMUSCULAR | Status: DC | PRN

## 2018-02-05 MED ORDER — LABETALOL HCL 5 MG/ML IV SOLN
INTRAVENOUS | Status: DC | PRN
  Administered 2018-02-05: 5 mg via INTRAVENOUS

## 2018-02-05 MED ORDER — FAMOTIDINE 20 MG PO TABS
ORAL_TABLET | ORAL | Status: AC
  Administered 2018-02-05: 20 mg via ORAL
  Filled 2018-02-05: qty 1

## 2018-02-05 SURGICAL SUPPLY — 38 items
APPLICATOR COTTON TIP 6 STRL (MISCELLANEOUS) ×1 IMPLANT
APPLICATOR COTTON TIP 6IN STRL (MISCELLANEOUS) ×2
APPLIER CLIP 5 13 M/L LIGAMAX5 (MISCELLANEOUS)
BLADE CLIPPER SURG (BLADE) IMPLANT
CANISTER SUCT 1200ML W/VALVE (MISCELLANEOUS) ×2 IMPLANT
CHLORAPREP W/TINT 26ML (MISCELLANEOUS) ×2 IMPLANT
CLIP APPLIE 5 13 M/L LIGAMAX5 (MISCELLANEOUS) IMPLANT
CUTTER FLEX LINEAR 45M (STAPLE) ×2 IMPLANT
DERMABOND ADVANCED (GAUZE/BANDAGES/DRESSINGS) ×1
DERMABOND ADVANCED .7 DNX12 (GAUZE/BANDAGES/DRESSINGS) ×1 IMPLANT
ELECT CAUTERY BLADE 6.4 (BLADE) ×2 IMPLANT
ELECT REM PT RETURN 9FT ADLT (ELECTROSURGICAL) ×2
ELECTRODE REM PT RTRN 9FT ADLT (ELECTROSURGICAL) ×1 IMPLANT
GLOVE BIO SURGEON STRL SZ7 (GLOVE) ×14 IMPLANT
GOWN STRL REUS W/ TWL LRG LVL3 (GOWN DISPOSABLE) ×2 IMPLANT
GOWN STRL REUS W/TWL LRG LVL3 (GOWN DISPOSABLE) ×2
IRRIGATION STRYKERFLOW (MISCELLANEOUS) ×1 IMPLANT
IRRIGATOR STRYKERFLOW (MISCELLANEOUS) ×2
IV NS 1000ML (IV SOLUTION) ×1
IV NS 1000ML BAXH (IV SOLUTION) ×1 IMPLANT
NEEDLE HYPO 22GX1.5 SAFETY (NEEDLE) ×2 IMPLANT
NS IRRIG 500ML POUR BTL (IV SOLUTION) ×2 IMPLANT
PACK LAP CHOLECYSTECTOMY (MISCELLANEOUS) ×2 IMPLANT
PENCIL ELECTRO HAND CTR (MISCELLANEOUS) ×2 IMPLANT
POUCH SPECIMEN RETRIEVAL 10MM (ENDOMECHANICALS) ×2 IMPLANT
RELOAD 45 VASCULAR/THIN (ENDOMECHANICALS) IMPLANT
RELOAD STAPLE TA45 3.5 REG BLU (ENDOMECHANICALS) ×2 IMPLANT
SCISSORS METZENBAUM CVD 33 (INSTRUMENTS) ×2 IMPLANT
SHEARS HARMONIC ACE PLUS 36CM (ENDOMECHANICALS) ×2 IMPLANT
SLEEVE ENDOPATH XCEL 5M (ENDOMECHANICALS) ×2 IMPLANT
SPONGE LAP 18X18 RF (DISPOSABLE) ×2 IMPLANT
SUT MNCRL AB 4-0 PS2 18 (SUTURE) ×4 IMPLANT
SUT VICRYL 0 AB UR-6 (SUTURE) ×8 IMPLANT
SYR 20CC LL (SYRINGE) ×2 IMPLANT
TRAY FOLEY MTR SLVR 16FR STAT (SET/KITS/TRAYS/PACK) IMPLANT
TROCAR XCEL BLUNT TIP 100MML (ENDOMECHANICALS) ×2 IMPLANT
TROCAR XCEL NON-BLD 5MMX100MML (ENDOMECHANICALS) ×4 IMPLANT
TUBING INSUF HEATED (TUBING) ×2 IMPLANT

## 2018-02-05 NOTE — OR Nursing (Signed)
Patient has two piercing Nose and right ear. Tape applied surgeon, anesthesia and OR nurse aware.

## 2018-02-05 NOTE — Anesthesia Preprocedure Evaluation (Signed)
Anesthesia Evaluation  Patient identified by MRN, date of birth, ID band Patient awake    Reviewed: Allergy & Precautions, H&P , NPO status , Patient's Chart, lab work & pertinent test results, reviewed documented beta blocker date and time   Airway Mallampati: III  TM Distance: >3 FB Neck ROM: full    Dental  (+) Teeth Intact   Pulmonary neg pulmonary ROS, asthma , sleep apnea and Continuous Positive Airway Pressure Ventilation , COPD, former smoker,    Pulmonary exam normal        Cardiovascular Exercise Tolerance: Good hypertension, On Medications negative cardio ROS Normal cardiovascular exam Rhythm:regular Rate:Normal     Neuro/Psych PSYCHIATRIC DISORDERS Depression negative neurological ROS  negative psych ROS   GI/Hepatic negative GI ROS, Neg liver ROS, GERD  Medicated,  Endo/Other  negative endocrine ROSdiabetes, Well Controlled, Type 2, Oral Hypoglycemic Agents  Renal/GU negative Renal ROS  negative genitourinary   Musculoskeletal   Abdominal   Peds  Hematology negative hematology ROS (+)   Anesthesia Other Findings Past Medical History: No date: Asthma No date: Atopic dermatitis No date: Constipation No date: COPD (chronic obstructive pulmonary disease) (HCC) No date: Diabetes mellitus without complication (HCC) No date: DVT (deep venous thrombosis) (HCC) No date: GERD (gastroesophageal reflux disease) No date: Hyperlipidemia No date: Hypertension No date: Rheumatoid arthritis ([place]) Past Surgical History: 02/02/2018: COLONOSCOPY WITH PROPOFOL; N/A     Comment:  Procedure: COLONOSCOPY WITH PROPOFOL;  Surgeon: Jonathon Bellows, MD;  [facility] ENDOSCOPY;  Service:               Gastroenterology;  Laterality: N/A; No date: right arm surgery No date: TUBAL LIGATION   Reproductive/Obstetrics negative OB ROS                             Anesthesia  Physical Anesthesia Plan  ASA: III  Anesthesia Plan: General ETT   Post-op Pain Management:    Induction:   PONV Risk Score and Plan:   Airway Management Planned:   Additional Equipment:   Intra-op Plan:   Post-operative Plan:   Informed Consent: I have reviewed the patients History and Physical, chart, labs and discussed the procedure including the risks, benefits and alternatives for the proposed anesthesia with the patient or authorized representative who has indicated his/her understanding and acceptance.   Dental Advisory Given  Plan Discussed with: CRNA  Anesthesia Plan Comments:         Anesthesia Quick Evaluation

## 2018-02-05 NOTE — Anesthesia Procedure Notes (Signed)
Procedure Name: Intubation Date/Time: 02/05/2018 12:01 PM Performed by: Willette Alma, CRNA Pre-anesthesia Checklist: Patient identified, Patient being monitored, Timeout performed, Emergency Drugs available and Suction available Patient Re-evaluated:Patient Re-evaluated prior to induction Oxygen Delivery Method: Circle system utilized Preoxygenation: Pre-oxygenation with 100% oxygen Induction Type: IV induction Ventilation: Mask ventilation without difficulty Laryngoscope Size: Mac and 3 Grade View: Grade I Tube type: Oral Tube size: 7.0 mm Number of attempts: 1 Airway Equipment and Method: Stylet Placement Confirmation: ETT inserted through vocal cords under direct vision,  positive ETCO2 and breath sounds checked- equal and bilateral Secured at: 21 cm Tube secured with: Tape Dental Injury: Teeth and Oropharynx as per pre-operative assessment

## 2018-02-05 NOTE — Progress Notes (Signed)
Ice pack to incision

## 2018-02-05 NOTE — Progress Notes (Signed)
States will eat something and take a pain pill when home

## 2018-02-05 NOTE — Interval H&P Note (Signed)
History and Physical Interval Note:  02/05/2018 11:29 AM  Brenda Santana  has presented today for surgery, with the diagnosis of appendicitis  The various methods of treatment have been discussed with the patient and family. After consideration of risks, benefits and other options for treatment, the patient has consented to  Procedure(s): APPENDECTOMY LAPAROSCOPIC (N/A) as a surgical intervention .  The patient's history has been reviewed, patient examined, no change in status, stable for surgery.  I have reviewed the patient's chart and labs.  Questions were answered to the patient's satisfaction.     Sarah Ann

## 2018-02-05 NOTE — Op Note (Signed)
laparascopic appendectomy   Antoine Primas Date of operation:  02/05/2018  Indications: The patient presented with a history of  abdominal pain. Workup has revealed findings consistent with acute appendicitis.  Pre-operative Diagnosis: Hx appendiceal abscess  Post-operative Diagnosis: Same  Surgeon: Caroleen Hamman, MD, FACS  Anesthesia: General with endotracheal tube  Findings:  Inflammatory response around appendix and its mesentery. Thick mesentery.  Estimated Blood Loss: 10cc         Specimens: appendix         Complications:  none  Procedure Details  The patient was seen again in the preop area. The options of surgery versus observation were reviewed with the patient and/or family. The risks of bleeding, infection, recurrence of symptoms, negative laparoscopy, potential for an open procedure, bowel injury, abscess or infection, were all reviewed as well. The patient was taken to Operating Room, identified as Brenda Santana and the procedure verified as laparoscopic appendectomy. A Time Out was held and the above information confirmed.  The patient was placed in the supine position and general anesthesia was induced.  Antibiotic prophylaxis was administered and VT E prophylaxis was in place.   The abdomen was prepped and draped in a sterile fashion. An infraumbilical incision was made. A cutdown technique was used to enter the abdominal cavity. Two vicryl stitches were placed on the fascia and a Hasson trocar inserted. Pneumoperitoneum obtained. Two 5 mm ports were placed under direct visualization.   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 The appendix was carefully dissected. The mesoappendix was divided with Harmonic scalpel. The base of the appendix was dissected out and divided with a standard load Endo GIA.The appendix was placed in a Endo Catch bag and removed via the Hasson port. The right lower  quadrant and pelvis was then irrigated with  normal saline which was aspirated. Inspection  failed to identify any additional bleeding and there were no signs of bowel injury. Again the right lower quadrant was inspected there was no sign of bleeding or bowel injury therefore pneumoperitoneum was released, all ports were removed.  The umbilical fascia was closed with 0 Vicryl interrupted sutures and the skin incisions were approximated with subcuticular 4-0 Monocryl. Dermabond was placed The patient tolerated the procedure well, there were no complications. The sponge lap and needle count were correct at the end of the procedure.  The patient was taken to the recovery room in stable condition to be admitted for continued care.    Caroleen Hamman, MD FACS

## 2018-02-05 NOTE — Transfer of Care (Signed)
Immediate Anesthesia Transfer of Care Note  Patient: Brenda Santana  Procedure(s) Performed: APPENDECTOMY LAPAROSCOPIC (N/A )  Patient Location: PACU  Anesthesia Type:General  Level of Consciousness: awake, alert  and oriented  Airway & Oxygen Therapy: Patient Spontanous Breathing and Patient connected to nasal cannula oxygen  Post-op Assessment: Report given to RN and Post -op Vital signs reviewed and stable  Post vital signs: stable  Last Vitals:  Vitals Value Taken Time  BP 126/67 02/05/2018  1:25 PM  Temp    Pulse 80 02/05/2018  1:27 PM  Resp 21 02/05/2018  1:27 PM  SpO2 98 % 02/05/2018  1:27 PM  Vitals shown include unvalidated device data.  Last Pain:  Vitals:   02/05/18 1040  TempSrc: Temporal  PainSc: 0-No pain         Complications: No apparent anesthesia complications

## 2018-02-05 NOTE — Anesthesia Post-op Follow-up Note (Signed)
Anesthesia QCDR form completed.        

## 2018-02-06 ENCOUNTER — Telehealth: Payer: Self-pay | Admitting: Surgery

## 2018-02-06 LAB — SURGICAL PATHOLOGY

## 2018-02-06 NOTE — Telephone Encounter (Signed)
Patient is calling and is complaining of being in a lot of pain, said her pain level is at a 10. She said she had to double up on her pills due to the pain. She can be reached at [phone]. Please call patient and advise.

## 2018-02-06 NOTE — Telephone Encounter (Signed)
Advised patient not to double up on her pain meds. Use ibuprofen in between the pain medication. Use a ice pack on the surgery area. Denies any fever or chills, no vomiting. The patient is aware to call back for any questions or concerns.

## 2018-02-07 LAB — SURGICAL PATHOLOGY

## 2018-02-07 NOTE — Anesthesia Postprocedure Evaluation (Signed)
Anesthesia Post Note  Patient: Brenda Santana  Procedure(s) Performed: APPENDECTOMY LAPAROSCOPIC (N/A )  Patient location during evaluation: PACU Anesthesia Type: General Level of consciousness: awake and alert Pain management: pain level controlled Vital Signs Assessment: post-procedure vital signs reviewed and stable Respiratory status: spontaneous breathing, nonlabored ventilation, respiratory function stable and patient connected to nasal cannula oxygen Cardiovascular status: blood pressure returned to baseline and stable Postop Assessment: no apparent nausea or vomiting Anesthetic complications: no     Last Vitals:  Vitals:   02/05/18 1434 02/05/18 1457  BP: (!) 151/76 114/66  Pulse: 69 74  Resp: 14   Temp: (!) 35.9 C   SpO2: 99% 96%    Last Pain:  Vitals:   02/06/18 0844  TempSrc:   PainSc: 3                  Molli Barrows

## 2018-02-09 ENCOUNTER — Ambulatory Visit: Payer: Self-pay | Admitting: Adult Health

## 2018-02-11 ENCOUNTER — Encounter: Payer: Self-pay | Admitting: Gastroenterology

## 2018-02-13 ENCOUNTER — Ambulatory Visit (INDEPENDENT_AMBULATORY_CARE_PROVIDER_SITE_OTHER): Payer: Medicare Other | Admitting: Adult Health

## 2018-02-13 ENCOUNTER — Encounter: Payer: Self-pay | Admitting: Adult Health

## 2018-02-13 VITALS — BP 106/62 | HR 66 | Resp 16 | Ht 65.0 in | Wt 237.0 lb

## 2018-02-13 DIAGNOSIS — E1165 Type 2 diabetes mellitus with hyperglycemia: Secondary | ICD-10-CM

## 2018-02-13 DIAGNOSIS — Z7901 Long term (current) use of anticoagulants: Secondary | ICD-10-CM | POA: Diagnosis not present

## 2018-02-13 DIAGNOSIS — K3532 Acute appendicitis with perforation and localized peritonitis, without abscess: Secondary | ICD-10-CM | POA: Diagnosis not present

## 2018-02-13 LAB — POCT GLYCOSYLATED HEMOGLOBIN (HGB A1C): Hemoglobin A1C: 6.5 % — AB (ref 4.0–5.6)

## 2018-02-13 LAB — POCT INR: INR: 2.1 (ref 2.0–3.0)

## 2018-02-13 MED ORDER — PHENTERMINE HCL 37.5 MG PO CAPS: 37.5000 mg | ORAL_CAPSULE | ORAL | 1 refills | Status: DC

## 2018-02-13 NOTE — Patient Instructions (Signed)
Diabetes Mellitus and Nutrition When you have diabetes (diabetes mellitus), it is very important to have healthy eating habits because your blood sugar (glucose) levels are greatly affected by what you eat and drink. Eating healthy foods in the appropriate amounts, at about the same times every day, can help you:  Control your blood glucose.  Lower your risk of heart disease.  Improve your blood pressure.  Reach or maintain a healthy weight.  Every person with diabetes is different, and each person has different needs for a meal plan. Your health care provider may recommend that you work with a diet and nutrition specialist (dietitian) to make a meal plan that is best for you. Your meal plan may vary depending on factors such as:  The calories you need.  The medicines you take.  Your weight.  Your blood glucose, blood pressure, and cholesterol levels.  Your activity level.  Other health conditions you have, such as heart or kidney disease.  How do carbohydrates affect me? Carbohydrates affect your blood glucose level more than any other type of food. Eating carbohydrates naturally increases the amount of glucose in your blood. Carbohydrate counting is a method for keeping track of how many carbohydrates you eat. Counting carbohydrates is important to keep your blood glucose at a healthy level, especially if you use insulin or take certain oral diabetes medicines. It is important to know how many carbohydrates you can safely have in each meal. This is different for every person. Your dietitian can help you calculate how many carbohydrates you should have at each meal and for snack. Foods that contain carbohydrates include:  Bread, cereal, rice, pasta, and crackers.  Potatoes and corn.  Peas, beans, and lentils.  Milk and yogurt.  Fruit and juice.  Desserts, such as cakes, cookies, ice cream, and candy.  How does alcohol affect me? Alcohol can cause a sudden decrease in blood  glucose (hypoglycemia), especially if you use insulin or take certain oral diabetes medicines. Hypoglycemia can be a life-threatening condition. Symptoms of hypoglycemia (sleepiness, dizziness, and confusion) are similar to symptoms of having too much alcohol. If your health care provider says that alcohol is safe for you, follow these guidelines:  Limit alcohol intake to no more than 1 drink per day for nonpregnant women and 2 drinks per day for men. One drink equals 12 oz of beer, 5 oz of wine, or 1 oz of hard liquor.  Do not drink on an empty stomach.  Keep yourself hydrated with water, diet soda, or unsweetened iced tea.  Keep in mind that regular soda, juice, and other mixers may contain a lot of sugar and must be counted as carbohydrates.  What are tips for following this plan? Reading food labels  Start by checking the serving size on the label. The amount of calories, carbohydrates, fats, and other nutrients listed on the label are based on one serving of the food. Many foods contain more than one serving per package.  Check the total grams (g) of carbohydrates in one serving. You can calculate the number of servings of carbohydrates in one serving by dividing the total carbohydrates by 15. For example, if a food has 30 g of total carbohydrates, it would be equal to 2 servings of carbohydrates.  Check the number of grams (g) of saturated and trans fats in one serving. Choose foods that have low or no amount of these fats.  Check the number of milligrams (mg) of sodium in one serving. Most people   should limit total sodium intake to less than 2,300 mg per day.  Always check the nutrition information of foods labeled as "low-fat" or "nonfat". These foods may be higher in added sugar or refined carbohydrates and should be avoided.  Talk to your dietitian to identify your daily goals for nutrients listed on the label. Shopping  Avoid buying canned, premade, or processed foods. These  foods tend to be high in fat, sodium, and added sugar.  Shop around the outside edge of the grocery store. This includes fresh fruits and vegetables, bulk grains, fresh meats, and fresh dairy. Cooking  Use low-heat cooking methods, such as baking, instead of high-heat cooking methods like deep frying.  Cook using healthy oils, such as olive, canola, or sunflower oil.  Avoid cooking with butter, cream, or high-fat meats. Meal planning  Eat meals and snacks regularly, preferably at the same times every day. Avoid going long periods of time without eating.  Eat foods high in fiber, such as fresh fruits, vegetables, beans, and whole grains. Talk to your dietitian about how many servings of carbohydrates you can eat at each meal.  Eat 4-6 ounces of lean protein each day, such as lean meat, chicken, fish, eggs, or tofu. 1 ounce is equal to 1 ounce of meat, chicken, or fish, 1 egg, or 1/4 cup of tofu.  Eat some foods each day that contain healthy fats, such as avocado, nuts, seeds, and fish. Lifestyle   Check your blood glucose regularly.  Exercise at least 30 minutes 5 or more days each week, or as told by your health care provider.  Take medicines as told by your health care provider.  Do not use any products that contain nicotine or tobacco, such as cigarettes and e-cigarettes. If you need help quitting, ask your health care provider.  Work with a counselor or diabetes educator to identify strategies to manage stress and any emotional and social challenges. What are some questions to ask my health care provider?  Do I need to meet with a diabetes educator?  Do I need to meet with a dietitian?  What number can I call if I have questions?  When are the best times to check my blood glucose? Where to find more information:  American Diabetes Association: diabetes.org/food-and-fitness/food  Academy of Nutrition and Dietetics:  www.eatright.org/resources/health/diseases-and-conditions/diabetes  National Institute of Diabetes and Digestive and Kidney Diseases (NIH): www.niddk.nih.gov/health-information/diabetes/overview/diet-eating-physical-activity Summary  A healthy meal plan will help you control your blood glucose and maintain a healthy lifestyle.  Working with a diet and nutrition specialist (dietitian) can help you make a meal plan that is best for you.  Keep in mind that carbohydrates and alcohol have immediate effects on your blood glucose levels. It is important to count carbohydrates and to use alcohol carefully. This information is not intended to replace advice given to you by your health care provider. Make sure you discuss any questions you have with your health care provider. Document Released: 01/13/2005 Document Revised: 05/23/2016 Document Reviewed: 05/23/2016 [employer] Interactive Patient Education  2018 [employer]  

## 2018-02-13 NOTE — Progress Notes (Signed)
[facility] [place], [place] [address]  Internal MEDICINE  Office Visit Note  Patient Name: Brenda Santana  [id number]  [id number]  Date of Service: 02/13/2018  Chief Complaint  Patient presents with  . Diabetes    follow up gi procedure     HPI Pt here for follow up after outpatient removal of appendix.  Pt had an abscess around appendix. The surgeon placed a percutaneous drain to treat the abscess.  This week, she had a colonoscopy, which was followed by an appendectomy.  She is now a week post-op and feeling better.  She denies further complaints at this time.  She is understandablly still sore. Her diabetes appears well controlled, with her current A1C being 6.5.  She is currently taking Invokamet, and Victoza.      Current Medication: Outpatient Encounter Medications as of 02/13/2018  Medication Sig Note  . acyclovir (ZOVIRAX) 400 MG tablet Take 400 mg by mouth 2 (two) times daily.   Marland Kitchen albuterol (PROVENTIL HFA) 108 (90 Base) MCG/ACT inhaler Inhale 2 puffs into the lungs every 6 (six) hours as needed for wheezing or shortness of breath.    . allopurinol (ZYLOPRIM) 300 MG tablet TAKE ONE TAB AT NIGHT FOR GOUT (Patient taking differently: Take 300 mg by mouth at bedtime. )   . aspirin EC 81 MG tablet Take 81 mg by mouth daily.   . Biotin 5 MG CAPS Take 5 mg by mouth daily.    . cetirizine (ZYRTEC ALLERGY) 10 MG tablet Take 10 mg by mouth daily as needed for allergies.    . chlorthalidone (HYGROTON) 25 MG tablet TAKE 1 TABLET BY MOUTH DAILY EVERY MORNING (Patient taking differently: Take 12.5 mg by mouth daily. )   . clobetasol cream (TEMOVATE) 5.32 % Apply 1 application topically 2 (two) times daily as needed (for eczema on hands).    . docusate sodium (COLACE) 100 MG capsule Take 100 mg by mouth daily.    Marland Kitchen doxycycline (VIBRA-TABS) 100 MG tablet Take 100 mg by mouth 2 (two) times daily.  01/31/2018: Continuous  . famotidine (PEPCID) 20 MG tablet Take 20  mg by mouth daily as needed for heartburn or indigestion.    . Ferrous Sulfate (IRON) 325 (65 Fe) MG TABS Take 325 mg by mouth 3 (three) times daily.  01/31/2018: Stopped for procedure  . Fluticasone-Salmeterol (ADVAIR DISKUS) 500-50 MCG/DOSE AEPB Inhale 1 puff into the lungs 2 (two) times daily as needed (for shortness of breath or wheezing).    . gabapentin (NEURONTIN) 800 MG tablet Take 800 mg by mouth 3 (three) times daily as needed (for pain).    . INCRUSE ELLIPTA 62.5 MCG/INH AEPB TAKE 1 PUFF BY MOUTH EVERY DAY   . INVOKAMET 50-500 MG TABS TAKE 1 TABLET BY MOUTH TWICE A DAY (Patient taking differently: Take 1 tablet by mouth 2 (two) times daily. )   . ipratropium-albuterol (DUONEB) 0.5-2.5 (3) MG/3ML SOLN Take 3 mLs by nebulization every 6 (six) hours as needed (for shortness of breath or wheezing).    Marland Kitchen LINZESS 145 MCG CAPS capsule TAKE ONE CAPSULE BY MOUTH ONCE DAILY FOR CONSTIPATION (Patient taking differently: Take 145 mcg by mouth daily before breakfast. )   . lisinopril (PRINIVIL,ZESTRIL) 10 MG tablet TAKE 1 TAB DAILY IN MORNING (Patient taking differently: Take 10 mg by mouth daily. )   . metoprolol tartrate (LOPRESSOR) 50 MG tablet TAKE 1 TABLET BY MOUTH TWICE A DAY (Patient taking differently: Take 50 mg  by mouth 2 (two) times daily. )   . montelukast (SINGULAIR) 10 MG tablet TAKE 1 TABLET BY MOUTH DAILY FOR ASTHMA (Patient taking differently: Take 10 mg by mouth daily. )   . oxymetazoline (AFRIN) 0.05 % nasal spray Place 1 spray into both nostrils 2 (two) times daily as needed for congestion.   . phentermine 37.5 MG capsule Take 1 capsule (37.5 mg total) by mouth every morning.   . predniSONE (DELTASONE) 5 MG tablet TAKE 2 TABLETS BY MOUTH EVERY DAY (Patient taking differently: Take 15 mg by mouth daily with breakfast. )   . traMADol (ULTRAM) 50 MG tablet Take 50 mg by mouth every 8 (eight) hours as needed for moderate pain.    Marland Kitchen tretinoin (RETIN-A) 0.025 % cream Apply 1 application  topically at bedtime as needed (for eczema on face).    Marland Kitchen VICTOZA 18 MG/3ML SOPN TAKE 1.8 MG EVERY MORNING (Patient taking differently: Inject 1.8 mg into the skin daily. )   . warfarin (COUMADIN) 5 MG tablet Take 7.5 mg by mouth See admin instructions. Take 7.5 mg by mouth daily except do not take ANY on Tuesday. 01/31/2018: Stopped for procedure on 09/30  . BD PEN NEEDLE NANO U/F 32G X 4 MM MISC USE DAILY WITH VICTOZA (Patient not taking: Reported on 01/31/2018)   . HYDROcodone-acetaminophen (NORCO/VICODIN) 5-325 MG tablet Take 1-2 tablets by mouth every 6 (six) hours as needed for moderate pain. (Patient not taking: Reported on 02/13/2018)   . [DISCONTINUED] colchicine 0.6 MG tablet Take 1 tablet (0.6 mg total) by mouth daily. Take 2 tablets at onset of pain, then may take 1 more 1 hour later if pain continues. (Patient not taking: Reported on 01/31/2018)   . [DISCONTINUED] cyclobenzaprine (FLEXERIL) 5 MG tablet Take 1 tablet (5 mg total) by mouth 3 (three) times daily as needed for muscle spasms. (Patient not taking: Reported on 01/31/2018)   . [DISCONTINUED] dicyclomine (BENTYL) 10 MG capsule Take 1 capsule (10 mg total) by mouth 4 (four) times daily -  before meals and at bedtime. (Patient not taking: Reported on 01/31/2018)    No facility-administered encounter medications on file as of 02/13/2018.     Surgical History: Past Surgical History:  Procedure Laterality Date  . APPENDECTOMY    . COLONOSCOPY WITH PROPOFOL N/A 02/02/2018   Procedure: COLONOSCOPY WITH PROPOFOL;  Surgeon: Jonathon Bellows, MD;  Location: [facility] ENDOSCOPY;  Service: Gastroenterology;  Laterality: N/A;  . LAPAROSCOPIC APPENDECTOMY N/A 02/05/2018   Procedure: APPENDECTOMY LAPAROSCOPIC;  Surgeon: Jules Husbands, MD;  Location: [facility] ORS;  Service: General;  Laterality: N/A;  . right arm surgery    . TUBAL LIGATION      Medical History: Past Medical History:  Diagnosis Date  . Asthma   . Atopic dermatitis   . Constipation   .  COPD (chronic obstructive pulmonary disease) ([place])   . Diabetes mellitus without complication ([place])   . DVT (deep venous thrombosis) ([place])   . GERD (gastroesophageal reflux disease)   . Hyperlipidemia   . Hypertension   . Rheumatoid arthritis ([place])     Family History: Family History  Problem Relation Age of Onset  . Breast cancer Mother   . Hypertension Mother   . Diabetes Mother   . Hypertension Father   . Heart failure Father     Social History   Socioeconomic History  . Marital status: Single    Spouse name: Not on file  . Number of children: Not on file  .  Years of education: Not on file  . Highest education level: Not on file  Occupational History  . Not on file  Social Needs  . Financial resource strain: Not on file  . Food insecurity:    Worry: Not on file    Inability: Not on file  . Transportation needs:    Medical: Not on file    Non-medical: Not on file  Tobacco Use  . Smoking status: Former Research scientist (life sciences)  . Smokeless tobacco: Never Used  Substance and Sexual Activity  . Alcohol use: No  . Drug use: No  . Sexual activity: Yes  Lifestyle  . Physical activity:    Days per week: Not on file    Minutes per session: Not on file  . Stress: Not on file  Relationships  . Social connections:    Talks on phone: Not on file    Gets together: Not on file    Attends religious service: Not on file    Active member of club or organization: Not on file    Attends meetings of clubs or organizations: Not on file    Relationship status: Not on file  . Intimate partner violence:    Fear of current or ex partner: Not on file    Emotionally abused: Not on file    Physically abused: Not on file    Forced sexual activity: Not on file  Other Topics Concern  . Not on file  Social History Narrative  . Not on file      Review of Systems  Constitutional: Negative for chills, fatigue and unexpected weight change.  HENT: Negative for congestion, rhinorrhea, sneezing and  sore throat.   Eyes: Negative for photophobia, pain and redness.  Respiratory: Negative for cough, chest tightness and shortness of breath.   Cardiovascular: Negative for chest pain and palpitations.  Gastrointestinal: Negative for abdominal pain, constipation, diarrhea, nausea and vomiting.  Endocrine: Negative.   Genitourinary: Negative for dysuria and frequency.  Musculoskeletal: Negative for arthralgias, back pain, joint swelling and neck pain.  Skin: Negative for rash.  Allergic/Immunologic: Negative.   Neurological: Negative for tremors and numbness.  Hematological: Negative for adenopathy. Does not bruise/bleed easily.  Psychiatric/Behavioral: Negative for behavioral problems and sleep disturbance. The patient is not nervous/anxious.     Vital Signs: BP 106/62   Pulse 66   Resp 16   Ht 5\' 5"  (1.651 m)   Wt 237 lb (107.5 kg)   BMI 39.44 kg/m    Physical Exam  Constitutional: She is oriented to person, place, and time. She appears well-developed and well-nourished. No distress.  HENT:  Head: Normocephalic and atraumatic.  Mouth/Throat: Oropharynx is clear and moist. No oropharyngeal exudate.  Eyes: Pupils are equal, round, and reactive to light. EOM are normal.  Neck: Normal range of motion. Neck supple. No JVD present. No tracheal deviation present. No thyromegaly present.  Cardiovascular: Normal rate, regular rhythm and normal heart sounds. Exam reveals no gallop and no friction rub.  No murmur heard. Pulmonary/Chest: Effort normal and breath sounds normal. No respiratory distress. She has no wheezes. She has no rales. She exhibits no tenderness.  Abdominal: Soft. There is no tenderness. There is no guarding.  Musculoskeletal: Normal range of motion.  Lymphadenopathy:    She has no cervical adenopathy.  Neurological: She is alert and oriented to person, place, and time. No cranial nerve deficit.  Skin: Skin is warm and dry. She is not diaphoretic.  Psychiatric: She has  a normal mood  and affect. Her behavior is normal. Judgment and thought content normal.  Nursing note and vitals reviewed.  Assessment/Plan:  1. Uncontrolled type 2 diabetes mellitus with hyperglycemia (HCC) A1C today 6.5, continue current therapy, follow up in 3 months.  Due to recent surgery, and infections, A1C slightly elevated.  Will reevaluate in 3 months before changing medications.  - POCT HgB A1C  2. Long term (current) use of anticoagulants Pt had blood clot in right arm 6 years ago.  She remains on coumadin.  INR 2.1 today. Continue current therapy. - POCT INR  3. Appendicitis with perforation Resolved.  Pt had appendectomy one week ago.  Healing well at this time.   4. Morbid obesity ([place]) Obesity Counseling: Risk Assessment: An assessment of behavioral risk factors was made today and includes lack of exercise sedentary lifestyle, lack of portion control and poor dietary habits.  Risk Modification Advice: She was counseled on portion control guidelines. Restricting daily caloric intake to. . The detrimental long term effects of obesity on her health and ongoing poor compliance was also discussed with the patient.  There is a liability release in patients' chart. There has been a 10 minute discussion about the side effects including but not limited to elevated blood pressure, anxiety, lack of sleep and dry mouth. Pt understands and will like to start/continue on appetite suppressant at this time. There will be one month RX given at the time of visit with proper follow up. [place] diet plan with restricted calories is given to the pt. Pt understands and agrees with  plan of treatment - phentermine 37.5 MG capsule; Take 1 capsule (37.5 mg total) by mouth every morning.  Dispense: 30 capsule; Refill: 1  General Counseling: Kristine verbalizes understanding of the findings of todays visit and agrees with plan of treatment. I have discussed any further diagnostic evaluation that may be  needed or ordered today. We also reviewed her medications today. she has been encouraged to call the office with any questions or concerns that should arise related to todays visit.    Orders Placed This Encounter  Procedures  . POCT HgB A1C  . POCT INR    No orders of the defined types were placed in this encounter.   Time spent: 25 Minutes   This patient was seen by Orson Gear AGNP-C in Collaboration with Dr Lavera Guise as a part of collaborative care agreement     Kendell Bane AGNP-C Internal medicine

## 2018-02-15 ENCOUNTER — Other Ambulatory Visit: Payer: Self-pay

## 2018-02-15 MED ORDER — ACYCLOVIR 400 MG PO TABS: 400.0000 mg | ORAL_TABLET | Freq: Two times a day (BID) | ORAL | 1 refills | Status: DC

## 2018-02-19 ENCOUNTER — Ambulatory Visit (INDEPENDENT_AMBULATORY_CARE_PROVIDER_SITE_OTHER): Payer: Medicare Other | Admitting: Surgery

## 2018-02-19 ENCOUNTER — Encounter: Payer: Self-pay | Admitting: Surgery

## 2018-02-19 ENCOUNTER — Other Ambulatory Visit: Payer: Self-pay

## 2018-02-19 VITALS — BP 126/84 | HR 87 | Temp 97.0°F | Wt 238.0 lb

## 2018-02-19 DIAGNOSIS — Z09 Encounter for follow-up examination after completed treatment for conditions other than malignant neoplasm: Secondary | ICD-10-CM

## 2018-02-19 NOTE — Patient Instructions (Addendum)

## 2018-02-19 NOTE — Progress Notes (Signed)
S/p Lap appy 10/7 Path d/w pt Doing very well No fevers , minimal pain + PO  PE: NAD Abd: soft, nt, incisions healing well, no infection  A/p Doing very well No heavy lifting RTC prn

## 2018-02-23 ENCOUNTER — Other Ambulatory Visit: Payer: Self-pay | Admitting: Internal Medicine

## 2018-03-04 ENCOUNTER — Other Ambulatory Visit: Payer: Self-pay | Admitting: Internal Medicine

## 2018-03-15 ENCOUNTER — Other Ambulatory Visit: Payer: Self-pay

## 2018-03-15 MED ORDER — ACYCLOVIR 400 MG PO TABS: 400.0000 mg | ORAL_TABLET | Freq: Two times a day (BID) | ORAL | 1 refills | Status: DC

## 2018-03-22 ENCOUNTER — Encounter: Payer: Self-pay | Admitting: Adult Health

## 2018-03-22 ENCOUNTER — Ambulatory Visit (INDEPENDENT_AMBULATORY_CARE_PROVIDER_SITE_OTHER): Payer: Medicare Other | Admitting: Adult Health

## 2018-03-22 VITALS — BP 110/80 | HR 76 | Resp 16 | Ht 65.0 in | Wt 237.0 lb

## 2018-03-22 DIAGNOSIS — E1165 Type 2 diabetes mellitus with hyperglycemia: Secondary | ICD-10-CM | POA: Diagnosis not present

## 2018-03-22 DIAGNOSIS — Z7901 Long term (current) use of anticoagulants: Secondary | ICD-10-CM | POA: Diagnosis not present

## 2018-03-22 DIAGNOSIS — Z86718 Personal history of other venous thrombosis and embolism: Secondary | ICD-10-CM

## 2018-03-22 LAB — POCT INR: INR: 2.4 (ref 2.0–3.0)

## 2018-03-22 NOTE — Progress Notes (Signed)
inr 2.4 pt 28.6

## 2018-03-22 NOTE — Patient Instructions (Signed)
Diabetes Mellitus and Nutrition When you have diabetes (diabetes mellitus), it is very important to have healthy eating habits because your blood sugar (glucose) levels are greatly affected by what you eat and drink. Eating healthy foods in the appropriate amounts, at about the same times every day, can help you:  Control your blood glucose.  Lower your risk of heart disease.  Improve your blood pressure.  Reach or maintain a healthy weight.  Every person with diabetes is different, and each person has different needs for a meal plan. Your health care provider may recommend that you work with a diet and nutrition specialist (dietitian) to make a meal plan that is best for you. Your meal plan may vary depending on factors such as:  The calories you need.  The medicines you take.  Your weight.  Your blood glucose, blood pressure, and cholesterol levels.  Your activity level.  Other health conditions you have, such as heart or kidney disease.  How do carbohydrates affect me? Carbohydrates affect your blood glucose level more than any other type of food. Eating carbohydrates naturally increases the amount of glucose in your blood. Carbohydrate counting is a method for keeping track of how many carbohydrates you eat. Counting carbohydrates is important to keep your blood glucose at a healthy level, especially if you use insulin or take certain oral diabetes medicines. It is important to know how many carbohydrates you can safely have in each meal. This is different for every person. Your dietitian can help you calculate how many carbohydrates you should have at each meal and for snack. Foods that contain carbohydrates include:  Bread, cereal, rice, pasta, and crackers.  Potatoes and corn.  Peas, beans, and lentils.  Milk and yogurt.  Fruit and juice.  Desserts, such as cakes, cookies, ice cream, and candy.  How does alcohol affect me? Alcohol can cause a sudden decrease in blood  glucose (hypoglycemia), especially if you use insulin or take certain oral diabetes medicines. Hypoglycemia can be a life-threatening condition. Symptoms of hypoglycemia (sleepiness, dizziness, and confusion) are similar to symptoms of having too much alcohol. If your health care provider says that alcohol is safe for you, follow these guidelines:  Limit alcohol intake to no more than 1 drink per day for nonpregnant women and 2 drinks per day for men. One drink equals 12 oz of beer, 5 oz of wine, or 1 oz of hard liquor.  Do not drink on an empty stomach.  Keep yourself hydrated with water, diet soda, or unsweetened iced tea.  Keep in mind that regular soda, juice, and other mixers may contain a lot of sugar and must be counted as carbohydrates.  What are tips for following this plan? Reading food labels  Start by checking the serving size on the label. The amount of calories, carbohydrates, fats, and other nutrients listed on the label are based on one serving of the food. Many foods contain more than one serving per package.  Check the total grams (g) of carbohydrates in one serving. You can calculate the number of servings of carbohydrates in one serving by dividing the total carbohydrates by 15. For example, if a food has 30 g of total carbohydrates, it would be equal to 2 servings of carbohydrates.  Check the number of grams (g) of saturated and trans fats in one serving. Choose foods that have low or no amount of these fats.  Check the number of milligrams (mg) of sodium in one serving. Most people   should limit total sodium intake to less than 2,300 mg per day.  Always check the nutrition information of foods labeled as "low-fat" or "nonfat". These foods may be higher in added sugar or refined carbohydrates and should be avoided.  Talk to your dietitian to identify your daily goals for nutrients listed on the label. Shopping  Avoid buying canned, premade, or processed foods. These  foods tend to be high in fat, sodium, and added sugar.  Shop around the outside edge of the grocery store. This includes fresh fruits and vegetables, bulk grains, fresh meats, and fresh dairy. Cooking  Use low-heat cooking methods, such as baking, instead of high-heat cooking methods like deep frying.  Cook using healthy oils, such as olive, canola, or sunflower oil.  Avoid cooking with butter, cream, or high-fat meats. Meal planning  Eat meals and snacks regularly, preferably at the same times every day. Avoid going long periods of time without eating.  Eat foods high in fiber, such as fresh fruits, vegetables, beans, and whole grains. Talk to your dietitian about how many servings of carbohydrates you can eat at each meal.  Eat 4-6 ounces of lean protein each day, such as lean meat, chicken, fish, eggs, or tofu. 1 ounce is equal to 1 ounce of meat, chicken, or fish, 1 egg, or 1/4 cup of tofu.  Eat some foods each day that contain healthy fats, such as avocado, nuts, seeds, and fish. Lifestyle   Check your blood glucose regularly.  Exercise at least 30 minutes 5 or more days each week, or as told by your health care provider.  Take medicines as told by your health care provider.  Do not use any products that contain nicotine or tobacco, such as cigarettes and e-cigarettes. If you need help quitting, ask your health care provider.  Work with a counselor or diabetes educator to identify strategies to manage stress and any emotional and social challenges. What are some questions to ask my health care provider?  Do I need to meet with a diabetes educator?  Do I need to meet with a dietitian?  What number can I call if I have questions?  When are the best times to check my blood glucose? Where to find more information:  American Diabetes Association: diabetes.org/food-and-fitness/food  Academy of Nutrition and Dietetics:  www.eatright.org/resources/health/diseases-and-conditions/diabetes  National Institute of Diabetes and Digestive and Kidney Diseases (NIH): www.niddk.nih.gov/health-information/diabetes/overview/diet-eating-physical-activity Summary  A healthy meal plan will help you control your blood glucose and maintain a healthy lifestyle.  Working with a diet and nutrition specialist (dietitian) can help you make a meal plan that is best for you.  Keep in mind that carbohydrates and alcohol have immediate effects on your blood glucose levels. It is important to count carbohydrates and to use alcohol carefully. This information is not intended to replace advice given to you by your health care provider. Make sure you discuss any questions you have with your health care provider. Document Released: 01/13/2005 Document Revised: 05/23/2016 Document Reviewed: 05/23/2016 [employer] Interactive Patient Education  2018 [employer]  

## 2018-03-22 NOTE — Progress Notes (Signed)
[facility] [place], [place] [address]  Internal MEDICINE  Office Visit Note  Patient Name: Brenda Santana  [id number]  [id number]  Date of Service: 03/23/2018  Chief Complaint  Patient presents with  . Diabetes  . Medical Management of Chronic Issues    weight loss management  . Atrial Fibrillation    HPI Pt is here for follow up on DM, chronic anticoagulation, and weight loss management. Pt has not been taking her blood sugars lately.  Her last A1C was 6.5.  Her current INR is 2.4.  Her weight has maintained since her last visit, and she continues to deny chest pain, SOB, palpitations or other symptoms.     Current Medication: Outpatient Encounter Medications as of 03/22/2018  Medication Sig Note  . acyclovir (ZOVIRAX) 400 MG tablet Take 1 tablet (400 mg total) by mouth 2 (two) times daily.   Marland Kitchen acyclovir ointment (ZOVIRAX) 5 % APPLY TOPICALLY EVERY 3 (THREE) HOURS.   Marland Kitchen albuterol (PROVENTIL HFA) 108 (90 Base) MCG/ACT inhaler Inhale 2 puffs into the lungs every 6 (six) hours as needed for wheezing or shortness of breath.    . allopurinol (ZYLOPRIM) 300 MG tablet TAKE ONE TAB AT NIGHT FOR GOUT (Patient taking differently: Take 300 mg by mouth at bedtime. )   . aspirin EC 81 MG tablet Take 81 mg by mouth daily.   . BD PEN NEEDLE NANO U/F 32G X 4 MM MISC USE DAILY WITH VICTOZA   . Biotin 5 MG CAPS Take 5 mg by mouth daily.    . cetirizine (ZYRTEC ALLERGY) 10 MG tablet Take 10 mg by mouth daily as needed for allergies.    . chlorthalidone (HYGROTON) 25 MG tablet TAKE 1 TABLET BY MOUTH DAILY EVERY MORNING (Patient taking differently: Take 12.5 mg by mouth daily. )   . clobetasol cream (TEMOVATE) 8.29 % Apply 1 application topically 2 (two) times daily as needed (for eczema on hands).    . diclofenac sodium (VOLTAREN) 1 % GEL APPLY 4 G TOPICALLY FOUR (4) TIMES A DAY.   Marland Kitchen docusate sodium (COLACE) 100 MG capsule Take 100 mg by mouth daily.    Marland Kitchen doxycycline  (VIBRA-TABS) 100 MG tablet Take 100 mg by mouth 2 (two) times daily.  01/31/2018: Continuous  . famotidine (PEPCID) 20 MG tablet Take 20 mg by mouth daily as needed for heartburn or indigestion.    . Ferrous Sulfate (IRON) 325 (65 Fe) MG TABS Take 325 mg by mouth 3 (three) times daily.  01/31/2018: Stopped for procedure  . Fluticasone-Salmeterol (ADVAIR DISKUS) 500-50 MCG/DOSE AEPB Inhale 1 puff into the lungs 2 (two) times daily as needed (for shortness of breath or wheezing).    . gabapentin (NEURONTIN) 800 MG tablet Take 800 mg by mouth 3 (three) times daily as needed (for pain).    . INCRUSE ELLIPTA 62.5 MCG/INH AEPB TAKE 1 PUFF BY MOUTH EVERY DAY   . INVOKAMET 50-500 MG TABS TAKE 1 TABLET BY MOUTH TWICE A DAY (Patient taking differently: Take 1 tablet by mouth 2 (two) times daily. )   . ipratropium-albuterol (DUONEB) 0.5-2.5 (3) MG/3ML SOLN Take 3 mLs by nebulization every 6 (six) hours as needed (for shortness of breath or wheezing).    Marland Kitchen linaclotide (LINZESS) 145 MCG CAPS capsule Take 1 capsule (145 mcg total) by mouth daily before breakfast.   . lisinopril (PRINIVIL,ZESTRIL) 10 MG tablet TAKE 1 TAB DAILY IN MORNING (Patient taking differently: Take 10 mg by mouth daily. )   .  metoprolol tartrate (LOPRESSOR) 50 MG tablet TAKE 1 TABLET BY MOUTH TWICE A DAY (Patient taking differently: Take 50 mg by mouth 2 (two) times daily. )   . montelukast (SINGULAIR) 10 MG tablet TAKE 1 TABLET BY MOUTH DAILY FOR ASTHMA (Patient taking differently: Take 10 mg by mouth daily. )   . oxymetazoline (AFRIN) 0.05 % nasal spray Place 1 spray into both nostrils 2 (two) times daily as needed for congestion.   . phentermine 37.5 MG capsule Take 1 capsule (37.5 mg total) by mouth every morning.   . predniSONE (DELTASONE) 5 MG tablet TAKE 2 TABLETS BY MOUTH EVERY DAY   . traMADol (ULTRAM) 50 MG tablet Take 50 mg by mouth every 8 (eight) hours as needed for moderate pain.    Marland Kitchen tretinoin (RETIN-A) 0.025 % cream Apply 1  application topically at bedtime as needed (for eczema on face).    Marland Kitchen VICTOZA 18 MG/3ML SOPN TAKE 1.8 MG EVERY MORNING (Patient taking differently: Inject 1.8 mg into the skin daily. )   . warfarin (COUMADIN) 5 MG tablet TAKE 1 AND 1/2 TABS (7.5) DAILY OR AS DIRECTED   . [DISCONTINUED] HYDROcodone-acetaminophen (NORCO/VICODIN) 5-325 MG tablet Take 1-2 tablets by mouth every 6 (six) hours as needed for moderate pain. (Patient not taking: Reported on 03/22/2018)    No facility-administered encounter medications on file as of 03/22/2018.     Surgical History: Past Surgical History:  Procedure Laterality Date  . APPENDECTOMY    . COLONOSCOPY WITH PROPOFOL N/A 02/02/2018   Procedure: COLONOSCOPY WITH PROPOFOL;  Surgeon: Jonathon Bellows, MD;  Location: [facility] ENDOSCOPY;  Service: Gastroenterology;  Laterality: N/A;  . LAPAROSCOPIC APPENDECTOMY N/A 02/05/2018   Procedure: APPENDECTOMY LAPAROSCOPIC;  Surgeon: Jules Husbands, MD;  Location: [facility] ORS;  Service: General;  Laterality: N/A;  . right arm surgery    . TUBAL LIGATION      Medical History: Past Medical History:  Diagnosis Date  . Asthma   . Atopic dermatitis   . Constipation   . COPD (chronic obstructive pulmonary disease) ([place])   . Diabetes mellitus without complication ([place])   . DVT (deep venous thrombosis) ([place])   . GERD (gastroesophageal reflux disease)   . Hyperlipidemia   . Hypertension   . Rheumatoid arthritis ([place])     Family History: Family History  Problem Relation Age of Onset  . Breast cancer Mother   . Hypertension Mother   . Diabetes Mother   . Hypertension Father   . Heart failure Father     Social History   Socioeconomic History  . Marital status: Single    Spouse name: Not on file  . Number of children: Not on file  . Years of education: Not on file  . Highest education level: Not on file  Occupational History  . Not on file  Social Needs  . Financial resource strain: Not on file  . Food insecurity:     Worry: Not on file    Inability: Not on file  . Transportation needs:    Medical: Not on file    Non-medical: Not on file  Tobacco Use  . Smoking status: Former Research scientist (life sciences)  . Smokeless tobacco: Never Used  Substance and Sexual Activity  . Alcohol use: No  . Drug use: No  . Sexual activity: Yes  Lifestyle  . Physical activity:    Days per week: Not on file    Minutes per session: Not on file  . Stress: Not on file  Relationships  .  Social connections:    Talks on phone: Not on file    Gets together: Not on file    Attends religious service: Not on file    Active member of club or organization: Not on file    Attends meetings of clubs or organizations: Not on file    Relationship status: Not on file  . Intimate partner violence:    Fear of current or ex partner: Not on file    Emotionally abused: Not on file    Physically abused: Not on file    Forced sexual activity: Not on file  Other Topics Concern  . Not on file  Social History Narrative  . Not on file    Review of Systems  Constitutional: Negative for chills, fatigue and unexpected weight change.  HENT: Negative for congestion, rhinorrhea, sneezing and sore throat.   Eyes: Negative for photophobia, pain and redness.  Respiratory: Negative for cough, chest tightness and shortness of breath.   Cardiovascular: Negative for chest pain and palpitations.  Gastrointestinal: Negative for abdominal pain, constipation, diarrhea, nausea and vomiting.  Endocrine: Negative.   Genitourinary: Negative for dysuria and frequency.  Musculoskeletal: Negative for arthralgias, back pain, joint swelling and neck pain.  Skin: Negative for rash.  Allergic/Immunologic: Negative.   Neurological: Negative for tremors and numbness.  Hematological: Negative for adenopathy. Does not bruise/bleed easily.  Psychiatric/Behavioral: Negative for behavioral problems and sleep disturbance. The patient is not nervous/anxious.     Vital Signs: BP  110/80 (BP Location: Left Arm, Patient Position: Sitting, Cuff Size: Normal)   Pulse 76   Resp 16   Ht 5\' 5"  (1.651 m)   Wt 237 lb (107.5 kg)   SpO2 97%   BMI 39.44 kg/m    Physical Exam  Constitutional: She is oriented to person, place, and time. She appears well-developed and well-nourished. No distress.  HENT:  Head: Normocephalic and atraumatic.  Mouth/Throat: Oropharynx is clear and moist. No oropharyngeal exudate.  Eyes: Pupils are equal, round, and reactive to light. EOM are normal.  Neck: Normal range of motion. Neck supple. No JVD present. No tracheal deviation present. No thyromegaly present.  Cardiovascular: Normal rate, regular rhythm and normal heart sounds. Exam reveals no gallop and no friction rub.  No murmur heard. Pulmonary/Chest: Effort normal and breath sounds normal. No respiratory distress. She has no wheezes. She has no rales. She exhibits no tenderness.  Abdominal: Soft. There is no tenderness. There is no guarding.  Musculoskeletal: Normal range of motion.  Lymphadenopathy:    She has no cervical adenopathy.  Neurological: She is alert and oriented to person, place, and time. No cranial nerve deficit.  Skin: Skin is warm and dry. She is not diaphoretic.  Psychiatric: She has a normal mood and affect. Her behavior is normal. Judgment and thought content normal.  Nursing note and vitals reviewed.  Assessment/Plan: 1. Personal history of venous thrombosis and embolism Patient has history of blood clot.  She is on chronic anti-coagulation and denies any recent issues with her blood clot.  2. Anticoagulant long-term use Patient's INR is 2.4 today continue current Coumadin dose. - POCT INR  3. Uncontrolled type 2 diabetes mellitus with hyperglycemia ([place]) Most recent A1c is 6.4.  Patient does not take her blood sugar at home regularly so she is unsure of what her blood sugar has been running.  Patient will follow-up in a couple months for recheck A1c.  I  encouraged her to take her blood sugars at least once  a day at home.  General Counseling: kherington meraz understanding of the findings of todays visit and agrees with plan of treatment. I have discussed any further diagnostic evaluation that may be needed or ordered today. We also reviewed her medications today. she has been encouraged to call the office with any questions or concerns that should arise related to todays visit.    Orders Placed This Encounter  Procedures  . POCT INR    No orders of the defined types were placed in this encounter.   Time spent: 25 Minutes   This patient was seen by Orson Gear AGNP-C in Collaboration with Dr Lavera Guise as a part of collaborative care agreement     Kendell Bane AGNP-C Internal medicine

## 2018-03-31 ENCOUNTER — Other Ambulatory Visit: Payer: Self-pay | Admitting: Adult Health

## 2018-04-08 ENCOUNTER — Other Ambulatory Visit: Payer: Self-pay | Admitting: Internal Medicine

## 2018-04-09 ENCOUNTER — Other Ambulatory Visit: Payer: Self-pay

## 2018-04-09 MED ORDER — ACYCLOVIR 400 MG PO TABS: 400.0000 mg | ORAL_TABLET | Freq: Two times a day (BID) | ORAL | 1 refills | Status: DC

## 2018-04-10 ENCOUNTER — Other Ambulatory Visit: Payer: Self-pay | Admitting: Adult Health

## 2018-04-11 LAB — COMPREHENSIVE METABOLIC PANEL
ALT: 14 IU/L (ref 0–32)
AST: 16 IU/L (ref 0–40)
Albumin/Globulin Ratio: 1.7 (ref 1.2–2.2)
Albumin: 4.4 g/dL (ref 3.5–5.5)
Alkaline Phosphatase: 120 IU/L — ABNORMAL HIGH (ref 39–117)
BUN/Creatinine Ratio: 16 (ref 9–23)
BUN: 17 mg/dL (ref 6–24)
Bilirubin Total: 0.4 mg/dL (ref 0.0–1.2)
CO2: 25 mmol/L (ref 20–29)
Calcium: 10.2 mg/dL (ref 8.7–10.2)
Chloride: 97 mmol/L (ref 96–106)
Creatinine, Ser: 1.04 mg/dL — ABNORMAL HIGH (ref 0.57–1.00)
GFR calc Af Amer: 69 mL/min/{1.73_m2} (ref >59)
GFR calc non Af Amer: 60 mL/min/{1.73_m2} (ref >59)
Globulin, Total: 2.6 g/dL (ref 1.5–4.5)
Glucose: 107 mg/dL — ABNORMAL HIGH (ref 65–99)
Potassium: 4.1 mmol/L (ref 3.5–5.2)
Sodium: 139 mmol/L (ref 134–144)
Total Protein: 7 g/dL (ref 6.0–8.5)

## 2018-04-11 LAB — CBC WITH DIFFERENTIAL/PLATELET
Basophils Absolute: 0 10*3/uL (ref 0.0–0.2)
Basos: 0 %
EOS (ABSOLUTE): 0.1 10*3/uL (ref 0.0–0.4)
Eos: 2 %
Hematocrit: 42.9 % (ref 34.0–46.6)
Hemoglobin: 14.2 g/dL (ref 11.1–15.9)
Immature Grans (Abs): 0 10*3/uL (ref 0.0–0.1)
Immature Granulocytes: 0 %
Lymphocytes Absolute: 2.4 10*3/uL (ref 0.7–3.1)
Lymphs: 29 %
MCH: 31.3 pg (ref 26.6–33.0)
MCHC: 33.1 g/dL (ref 31.5–35.7)
MCV: 95 fL (ref 79–97)
Monocytes Absolute: 0.8 10*3/uL (ref 0.1–0.9)
Monocytes: 10 %
Neutrophils Absolute: 4.7 10*3/uL (ref 1.4–7.0)
Neutrophils: 59 %
Platelets: 355 10*3/uL (ref 150–450)
RBC: 4.53 x10E6/uL (ref 3.77–5.28)
RDW: 13 % (ref 12.3–15.4)
WBC: 8.1 10*3/uL (ref 3.4–10.8)

## 2018-04-11 LAB — LIPID PANEL WITH LDL/HDL RATIO
Cholesterol, Total: 214 mg/dL — ABNORMAL HIGH (ref 100–199)
HDL: 64 mg/dL (ref >39)
LDL Calculated: 133 mg/dL — ABNORMAL HIGH (ref 0–99)
LDl/HDL Ratio: 2.1 ratio (ref 0.0–3.2)
Triglycerides: 83 mg/dL (ref 0–149)
VLDL Cholesterol Cal: 17 mg/dL (ref 5–40)

## 2018-04-11 LAB — TSH: TSH: 1.3 u[IU]/mL (ref 0.450–4.500)

## 2018-04-11 LAB — T4, FREE: Free T4: 1.29 ng/dL (ref 0.82–1.77)

## 2018-04-13 ENCOUNTER — Ambulatory Visit (INDEPENDENT_AMBULATORY_CARE_PROVIDER_SITE_OTHER): Payer: Medicare Other | Admitting: Adult Health

## 2018-04-13 ENCOUNTER — Encounter: Payer: Self-pay | Admitting: Adult Health

## 2018-04-13 VITALS — BP 122/80 | HR 73 | Resp 16 | Ht 65.0 in | Wt 238.0 lb

## 2018-04-13 DIAGNOSIS — Z0000 Encounter for general adult medical examination without abnormal findings: Secondary | ICD-10-CM

## 2018-04-13 DIAGNOSIS — Z7901 Long term (current) use of anticoagulants: Secondary | ICD-10-CM

## 2018-04-13 DIAGNOSIS — J449 Chronic obstructive pulmonary disease, unspecified: Secondary | ICD-10-CM

## 2018-04-13 DIAGNOSIS — E119 Type 2 diabetes mellitus without complications: Secondary | ICD-10-CM

## 2018-04-13 DIAGNOSIS — I10 Essential (primary) hypertension: Secondary | ICD-10-CM

## 2018-04-13 DIAGNOSIS — N939 Abnormal uterine and vaginal bleeding, unspecified: Secondary | ICD-10-CM

## 2018-04-13 DIAGNOSIS — G4733 Obstructive sleep apnea (adult) (pediatric): Secondary | ICD-10-CM

## 2018-04-13 DIAGNOSIS — R300 Dysuria: Secondary | ICD-10-CM

## 2018-04-13 DIAGNOSIS — Z0001 Encounter for general adult medical examination with abnormal findings: Secondary | ICD-10-CM

## 2018-04-13 LAB — POCT INR: INR: 1.1 — AB (ref 2.0–3.0)

## 2018-04-13 NOTE — Patient Instructions (Signed)
Diabetes Mellitus and Nutrition When you have diabetes (diabetes mellitus), it is very important to have healthy eating habits because your blood sugar (glucose) levels are greatly affected by what you eat and drink. Eating healthy foods in the appropriate amounts, at about the same times every day, can help you:  Control your blood glucose.  Lower your risk of heart disease.  Improve your blood pressure.  Reach or maintain a healthy weight.  Every person with diabetes is different, and each person has different needs for a meal plan. Your health care provider may recommend that you work with a diet and nutrition specialist (dietitian) to make a meal plan that is best for you. Your meal plan may vary depending on factors such as:  The calories you need.  The medicines you take.  Your weight.  Your blood glucose, blood pressure, and cholesterol levels.  Your activity level.  Other health conditions you have, such as heart or kidney disease.  How do carbohydrates affect me? Carbohydrates affect your blood glucose level more than any other type of food. Eating carbohydrates naturally increases the amount of glucose in your blood. Carbohydrate counting is a method for keeping track of how many carbohydrates you eat. Counting carbohydrates is important to keep your blood glucose at a healthy level, especially if you use insulin or take certain oral diabetes medicines. It is important to know how many carbohydrates you can safely have in each meal. This is different for every person. Your dietitian can help you calculate how many carbohydrates you should have at each meal and for snack. Foods that contain carbohydrates include:  Bread, cereal, rice, pasta, and crackers.  Potatoes and corn.  Peas, beans, and lentils.  Milk and yogurt.  Fruit and juice.  Desserts, such as cakes, cookies, ice cream, and candy.  How does alcohol affect me? Alcohol can cause a sudden decrease in blood  glucose (hypoglycemia), especially if you use insulin or take certain oral diabetes medicines. Hypoglycemia can be a life-threatening condition. Symptoms of hypoglycemia (sleepiness, dizziness, and confusion) are similar to symptoms of having too much alcohol. If your health care provider says that alcohol is safe for you, follow these guidelines:  Limit alcohol intake to no more than 1 drink per day for nonpregnant women and 2 drinks per day for men. One drink equals 12 oz of beer, 5 oz of wine, or 1 oz of hard liquor.  Do not drink on an empty stomach.  Keep yourself hydrated with water, diet soda, or unsweetened iced tea.  Keep in mind that regular soda, juice, and other mixers may contain a lot of sugar and must be counted as carbohydrates.  What are tips for following this plan? Reading food labels  Start by checking the serving size on the label. The amount of calories, carbohydrates, fats, and other nutrients listed on the label are based on one serving of the food. Many foods contain more than one serving per package.  Check the total grams (g) of carbohydrates in one serving. You can calculate the number of servings of carbohydrates in one serving by dividing the total carbohydrates by 15. For example, if a food has 30 g of total carbohydrates, it would be equal to 2 servings of carbohydrates.  Check the number of grams (g) of saturated and trans fats in one serving. Choose foods that have low or no amount of these fats.  Check the number of milligrams (mg) of sodium in one serving. Most people   should limit total sodium intake to less than 2,300 mg per day.  Always check the nutrition information of foods labeled as "low-fat" or "nonfat". These foods may be higher in added sugar or refined carbohydrates and should be avoided.  Talk to your dietitian to identify your daily goals for nutrients listed on the label. Shopping  Avoid buying canned, premade, or processed foods. These  foods tend to be high in fat, sodium, and added sugar.  Shop around the outside edge of the grocery store. This includes fresh fruits and vegetables, bulk grains, fresh meats, and fresh dairy. Cooking  Use low-heat cooking methods, such as baking, instead of high-heat cooking methods like deep frying.  Cook using healthy oils, such as olive, canola, or sunflower oil.  Avoid cooking with butter, cream, or high-fat meats. Meal planning  Eat meals and snacks regularly, preferably at the same times every day. Avoid going long periods of time without eating.  Eat foods high in fiber, such as fresh fruits, vegetables, beans, and whole grains. Talk to your dietitian about how many servings of carbohydrates you can eat at each meal.  Eat 4-6 ounces of lean protein each day, such as lean meat, chicken, fish, eggs, or tofu. 1 ounce is equal to 1 ounce of meat, chicken, or fish, 1 egg, or 1/4 cup of tofu.  Eat some foods each day that contain healthy fats, such as avocado, nuts, seeds, and fish. Lifestyle   Check your blood glucose regularly.  Exercise at least 30 minutes 5 or more days each week, or as told by your health care provider.  Take medicines as told by your health care provider.  Do not use any products that contain nicotine or tobacco, such as cigarettes and e-cigarettes. If you need help quitting, ask your health care provider.  Work with a counselor or diabetes educator to identify strategies to manage stress and any emotional and social challenges. What are some questions to ask my health care provider?  Do I need to meet with a diabetes educator?  Do I need to meet with a dietitian?  What number can I call if I have questions?  When are the best times to check my blood glucose? Where to find more information:  American Diabetes Association: diabetes.org/food-and-fitness/food  Academy of Nutrition and Dietetics:  www.eatright.org/resources/health/diseases-and-conditions/diabetes  National Institute of Diabetes and Digestive and Kidney Diseases (NIH): www.niddk.nih.gov/health-information/diabetes/overview/diet-eating-physical-activity Summary  A healthy meal plan will help you control your blood glucose and maintain a healthy lifestyle.  Working with a diet and nutrition specialist (dietitian) can help you make a meal plan that is best for you.  Keep in mind that carbohydrates and alcohol have immediate effects on your blood glucose levels. It is important to count carbohydrates and to use alcohol carefully. This information is not intended to replace advice given to you by your health care provider. Make sure you discuss any questions you have with your health care provider. Document Released: 01/13/2005 Document Revised: 05/23/2016 Document Reviewed: 05/23/2016 [employer] Interactive Patient Education  2018 [employer]  

## 2018-04-13 NOTE — Progress Notes (Signed)
[facility] [place], [place] [address]  Internal MEDICINE  Office Visit Note  Patient Name: Brenda Santana  [id number]  [id number]  Date of Service: 04/13/2018  Chief Complaint  Patient presents with  . Annual Exam    medicare annual exam   . Diabetes  . Hypertension  . Atrial Fibrillation  . Hyperlipidemia  . Vaginal Bleeding    bleeding started after 3 years      HPI Pt is here for routine health maintenance examination.  She is a well-appearing 57 year old African-American female.  She has a history of diabetes, hypertension, atrial fibrillation, and hyperlipidemia.  She denies any issues with her chronic medical conditions.  However she does report that after 3 years of not having a menstrual cycle she has become sexually active with a new partner and now has been noticing some spotting bleeding over the last 2 months.  She denies any pain or discomfort.  She denies tobacco, alcohol, or illicit drug use.   Current Medication: Outpatient Encounter Medications as of 04/13/2018  Medication Sig Note  . acyclovir (ZOVIRAX) 400 MG tablet Take 1 tablet (400 mg total) by mouth 2 (two) times daily.   Marland Kitchen acyclovir ointment (ZOVIRAX) 5 % APPLY TOPICALLY EVERY 3 (THREE) HOURS.   Marland Kitchen albuterol (PROVENTIL HFA) 108 (90 Base) MCG/ACT inhaler Inhale 2 puffs into the lungs every 6 (six) hours as needed for wheezing or shortness of breath.    . allopurinol (ZYLOPRIM) 300 MG tablet TAKE ONE TAB AT NIGHT FOR GOUT (Patient taking differently: Take 300 mg by mouth at bedtime. )   . aspirin EC 81 MG tablet Take 81 mg by mouth daily.   . BD PEN NEEDLE NANO U/F 32G X 4 MM MISC USE DAILY WITH VICTOZA   . Biotin 5 MG CAPS Take 5 mg by mouth daily.    . Canagliflozin-metFORMIN HCl (INVOKAMET) 50-500 MG TABS Take 1 tablet by mouth 2 (two) times daily.   . cetirizine (ZYRTEC ALLERGY) 10 MG tablet Take 10 mg by mouth daily as needed for allergies.    . chlorthalidone (HYGROTON) 25  MG tablet TAKE 1 TABLET BY MOUTH DAILY EVERY MORNING (Patient taking differently: Take 12.5 mg by mouth daily. )   . clobetasol cream (TEMOVATE) 3.76 % Apply 1 application topically 2 (two) times daily as needed (for eczema on hands).    . diclofenac sodium (VOLTAREN) 1 % GEL APPLY 4 G TOPICALLY FOUR (4) TIMES A DAY.   Marland Kitchen docusate sodium (COLACE) 100 MG capsule Take 100 mg by mouth daily.    Marland Kitchen doxycycline (VIBRA-TABS) 100 MG tablet Take 100 mg by mouth 2 (two) times daily.  01/31/2018: Continuous  . famotidine (PEPCID) 20 MG tablet Take 20 mg by mouth daily as needed for heartburn or indigestion.    . Ferrous Sulfate (IRON) 325 (65 Fe) MG TABS Take 325 mg by mouth 3 (three) times daily.  01/31/2018: Stopped for procedure  . Fluticasone-Salmeterol (ADVAIR DISKUS) 500-50 MCG/DOSE AEPB Inhale 1 puff into the lungs 2 (two) times daily as needed (for shortness of breath or wheezing).    . gabapentin (NEURONTIN) 800 MG tablet Take 800 mg by mouth 3 (three) times daily as needed (for pain).    . INCRUSE ELLIPTA 62.5 MCG/INH AEPB TAKE 1 PUFF BY MOUTH EVERY DAY   . ipratropium-albuterol (DUONEB) 0.5-2.5 (3) MG/3ML SOLN Take 3 mLs by nebulization every 6 (six) hours as needed (for shortness of breath or wheezing).    Marland Kitchen  linaclotide (LINZESS) 145 MCG CAPS capsule Take 1 capsule (145 mcg total) by mouth daily before breakfast.   . lisinopril (PRINIVIL,ZESTRIL) 10 MG tablet TAKE 1 TAB DAILY IN MORNING (Patient taking differently: Take 10 mg by mouth daily. )   . metoprolol tartrate (LOPRESSOR) 50 MG tablet TAKE 1 TABLET BY MOUTH TWICE A DAY (Patient taking differently: Take 50 mg by mouth 2 (two) times daily. )   . montelukast (SINGULAIR) 10 MG tablet TAKE 1 TABLET BY MOUTH DAILY FOR ASTHMA (Patient taking differently: Take 10 mg by mouth daily. )   . oxymetazoline (AFRIN) 0.05 % nasal spray Place 1 spray into both nostrils 2 (two) times daily as needed for congestion.   . phentermine 37.5 MG capsule Take 1 capsule  (37.5 mg total) by mouth every morning.   . predniSONE (DELTASONE) 5 MG tablet TAKE 2 TABLETS BY MOUTH EVERY DAY   . SYMBICORT 160-4.5 MCG/ACT inhaler INHALE 2 PUFF TWICE A DAY   . traMADol (ULTRAM) 50 MG tablet Take 50 mg by mouth every 8 (eight) hours as needed for moderate pain.    Marland Kitchen tretinoin (RETIN-A) 0.025 % cream Apply 1 application topically at bedtime as needed (for eczema on face).    Marland Kitchen VICTOZA 18 MG/3ML SOPN TAKE 1.8 MG EVERY MORNING (Patient taking differently: Inject 1.8 mg into the skin daily. )   . warfarin (COUMADIN) 5 MG tablet TAKE 1 AND 1/2 TABS (7.5) DAILY OR AS DIRECTED    No facility-administered encounter medications on file as of 04/13/2018.     Surgical History: Past Surgical History:  Procedure Laterality Date  . APPENDECTOMY    . COLONOSCOPY WITH PROPOFOL N/A 02/02/2018   Procedure: COLONOSCOPY WITH PROPOFOL;  Surgeon: Jonathon Bellows, MD;  Location[facility];  Service: Gastroenterology;  Laterality: N/A;  . LAPAROSCOPIC APPENDECTOMY N/A 02/05/2018   Procedure: APPENDECTOMY LAPAROSCOPIC;  Surgeon: Jules Husbands, MD;  Location: [facility] ORS;  Service: General;  Laterality: N/A;  . right arm surgery    . TUBAL LIGATION      Medical History: Past Medical History:  Diagnosis Date  . Asthma   . Atopic dermatitis   . Constipation   . COPD (chronic obstructive pulmonary disease) ([place])   . Diabetes mellitus without complication ([place])   . DVT (deep venous thrombosis) ([place])   . GERD (gastroesophageal reflux disease)   . Hyperlipidemia   . Hypertension   . Rheumatoid arthritis ([place])     Family History: Family History  Problem Relation Age of Onset  . Breast cancer Mother   . Hypertension Mother   . Diabetes Mother   . Hypertension Father   . Heart failure Father       Review of Systems   Vital Signs: BP 122/80 (BP Location: Left Arm, Patient Position: Sitting, Cuff Size: Normal)   Pulse 73   Resp 16   Ht '5\' 5"'$  (1.651 m)   Wt 238 lb (108 kg)    SpO2 98%   BMI 39.61 kg/m    Physical Exam Chest:     Breasts:        Right: Normal. No swelling, bleeding, inverted nipple, mass, nipple discharge, skin change or tenderness.        Left: Normal. No swelling, bleeding, inverted nipple, mass, nipple discharge, skin change or tenderness.     Comments: Exam Chaperoned by Dorna Bloom CMA     LABS: Recent Results (from the past 2160 hour(s))  POCT INR     Status: Abnormal  Collection Time: 01/17/18  9:57 AM  Result Value Ref Range   INR 1.6 (A) 2.0 - 3.0    Comment: 1.6 inr  and 18.8  take coumadin '5mg'$  2 tab today and continue taking 7.5 daily expect tuesday and follow up next friday  POCT INR     Status: Abnormal   Collection Time: 01/29/18  2:28 PM  Result Value Ref Range   INR 3.2 (A) 2.0 - 3.0    Comment: Inr3.2 and Pt 38.1 as per heather pt going for procedure for colonscopy stopped coumadin  today and start back 24 hrs after procedure and follow up wek to see pcp   Basic metabolic panel     Status: Abnormal   Collection Time: 02/01/18  9:33 AM  Result Value Ref Range   Sodium 138 135 - 145 mmol/L   Potassium 4.0 3.5 - 5.1 mmol/L   Chloride 101 98 - 111 mmol/L   CO2 28 22 - 32 mmol/L   Glucose, Bld 122 (H) 70 - 99 mg/dL   BUN 23 (H) 6 - 20 mg/dL   Creatinine, Ser 0.79 0.44 - 1.00 mg/dL   Calcium 9.9 8.9 - 10.3 mg/dL   GFR calc non Af Amer >60 >60 mL/min   GFR calc Af Amer >60 >60 mL/min    Comment: (NOTE) The eGFR has been calculated using the CKD EPI equation. This calculation has not been validated in all clinical situations. eGFR's persistently <60 mL/min signify possible Chronic Kidney Disease.    Anion gap 9 5 - 15    Comment: Performed at [facility], [address] [place], [place] [address]  CBC     Status: Abnormal   Collection Time: 02/01/18  9:33 AM  Result Value Ref Range   WBC 12.1 (H) 3.6 - 11.0 K/uL   RBC 4.33 3.80 - 5.20 MIL/uL   Hemoglobin 14.4 12.0 - 16.0 g/dL   HCT 43.3 35.0 -  47.0 %   MCV 99.9 80.0 - 100.0 fL   MCH 33.3 26.0 - 34.0 pg   MCHC 33.3 32.0 - 36.0 g/dL   RDW 15.2 (H) 11.5 - 14.5 %   Platelets 329 150 - 440 K/uL    Comment: Performed at [facility], [address] [place], [place] [address]  Protime-INR     Status: None   Collection Time: 02/02/18  9:59 AM  Result Value Ref Range   Prothrombin Time 14.3 11.4 - 15.2 seconds   INR 1.12     Comment: Performed at [facility], [address] [place], [place] [address]  Glucose, capillary     Status: Abnormal   Collection Time: 02/02/18 10:42 AM  Result Value Ref Range   Glucose-Capillary 105 (H) 70 - 99 mg/dL  Surgical pathology     Status: None   Collection Time: 02/02/18 11:24 AM  Result Value Ref Range   SURGICAL PATHOLOGY      Surgical Pathology CASE: [id number] PATIENT: Reeta Zollinger Surgical Pathology Report     SPECIMEN SUBMITTED: A. 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 COLON POLYP, ASCENDING; COLD BIOPSY: - COLONIC MUCOSA WITH PROMINENT LYMPHOID AGGREGATE. - NEGATIVE FOR DYSPLASIA AND MALIGNANCY.  GROSS DESCRIPTION: A. Labeled: Ascending colon polyp cbx Received: In formalin Tissue fragment(s): 1 Size: 0.5 cm Description: Pink-tan fragment Entirely submitted in one cassette.    Final Diagnosis performed  by Quay Burow, MD.   Electronically signed 02/06/2018 11:39:26AM The electronic signature indicates that the named Attending Pathologist has evaluated the specimen  Technical component performed at [facility], [address], [place], [place] [address] Lab: [phone] Dir: Rush Farmer, MD, MMM  Profess ional component performed at [facility], [facility], [place], [place], [place] [address] Lab: [phone] Dir: Dellia Nims. Rubinas, MD   Glucose,  capillary     Status: Abnormal   Collection Time: 02/05/18 10:35 AM  Result Value Ref Range   Glucose-Capillary 116 (H) 70 - 99 mg/dL  Pregnancy, urine POC     Status: None   Collection Time: 02/05/18 10:39 AM  Result Value Ref Range   Preg Test, Ur NEGATIVE NEGATIVE    Comment:        THE SENSITIVITY OF THIS METHODOLOGY IS >24 mIU/mL   Surgical pathology     Status: None   Collection Time: 02/05/18  1:02 PM  Result Value Ref Range   SURGICAL PATHOLOGY      Surgical Pathology CASE: [id number] PATIENT: Carrine Hullum Surgical Pathology Report     SPECIMEN SUBMITTED: A. Appendix  CLINICAL HISTORY: None provided  PRE-OPERATIVE DIAGNOSIS: Appendicitis  POST-OPERATIVE DIAGNOSIS: Same as pre-op     DIAGNOSIS: A. APPENDIX; APPENDECTOMY: - RUPTURED ACUTE APPENDICITIS WITH PERIAPPENDICEAL ABSCESS. - REACTIVE PERIAPPENDICEAL LYMPH NODE. - NEGATIVE FOR MALIGNANCY.  GROSS DESCRIPTION: A. Labeled: Appendix Received: In formalin Size: 8.2 x 1.5 x 0.9 cm External surface: Diffusely shaggy purple-tan with fibrous adhesions and adherent slightly firm chalky yellow fat Perforation: None grossly identified Fecalith: No Description: The proximal margin is inked blue. In the attached fibrofatty tissue there is a dilated area adjacent to the appendix containing fecal material as well as tan nodularity and a 0.5 cm possible lymph node.  Block summary: 1-3 - representative perpendicular margin, longitudinal tip and c ross-sections including possible lymph node   Final Diagnosis performed by Quay Burow, MD.   Electronically signed 02/07/2018 1:56:54PM The electronic signature indicates that the named Attending Pathologist has evaluated the specimen  Technical component performed at [facility], [address] [place], [place] [address] Lab: [phone] Dir: Rush Farmer, MD, MMM  Professional component performed at [facility], [facility], [place], [place],  [address] Lab: [phone] Dir: Dellia Nims. Rubinas, MD   Glucose, capillary     Status: Abnormal   Collection Time: 02/05/18  1:28 PM  Result Value Ref Range   Glucose-Capillary 116 (H) 70 - 99 mg/dL  POCT INR     Status: None   Collection Time: 02/13/18 11:13 AM  Result Value Ref Range   INR 2.1 2.0 - 3.0    Comment: pt 25.7  POCT HgB A1C     Status: Abnormal   Collection Time: 02/13/18 11:19 AM  Result Value Ref Range   Hemoglobin A1C 6.5 (A) 4.0 - 5.6 %   HbA1c POC (<> result, manual entry)     HbA1c, POC (prediabetic range)     HbA1c, POC (controlled diabetic range)    POCT INR     Status: None   Collection Time: 03/22/18  9:17 AM  Result Value Ref Range   INR 2.4 2.0 - 3.0    Comment: pt 28.6  POCT INR     Status: Abnormal   Collection Time: 04/13/18  9:25 AM  Result Value Ref Range   INR 1.1 (A) 2.0 - 3.0    Comment: PT 13.5    Assessment/Plan: 1. Encounter  for general adult medical examination with abnormal findings Awaiting patient's lab work.  She reports she had a labs drawn on Tuesday however no results are available.  Called [facility] and they are supposed to be faxing for these results.  2. Anticoagulant long-term use Patient INR was 1.1 today.  She currently uses 5 mg tablets of Coumadin.  She reports that she takes 1-1/2 tablets (7.5 mg ) 6 days a week.  She skips Tuesdays.  Based on her INR of 1.1 will have patient take one half of a tablet (2.39m) on Tuesday, continue the current dose of 7.5 mg the rest of the week.  Patient will return in 1 week for INR check. - POCT INR  3. Vaginal bleeding Patient referred to gynecology for abnormal menstruating. Also added on lab work for lMontrosehormone and follicle-stimulating hormone. - Ambulatory referral to Gynecology  4. Morbid obesity (HCC) Obesity Counseling: Risk Assessment: An assessment of behavioral risk factors was made today and includes lack of exercise sedentary lifestyle, lack of  portion control and poor dietary habits.  Risk Modification Advice: She was counseled on portion control guidelines. Restricting daily caloric intake to. . The detrimental long term effects of obesity on her health and ongoing poor compliance was also discussed with the patient.  5. Diabetes mellitus without complication (HCC) Last AC0Bwas 6.5.  Patient's blood sugars appear well controlled and she should continue taking her occasions as prescribed. Diabetes Counseling:  1. Addition of ACE inh/ ARB'S for nephroprotection. Microalbumin is updated  2. Diabetic foot care, prevention of complications. Podiatry consult 3. Exercise and lose weight.  4. Diabetic eye examination, Diabetic eye exam is updated  5. Monitor blood sugar closlely. nutrition counseling.  6. Sign and symptoms of hypoglycemia including shaking sweating,confusion and headaches.  6. Essential (primary) hypertension Stable, to new current medications.  7. Chronic obstructive pulmonary disease, unspecified COPD type ([place] Stable, patient is to continue treating symptoms with supportive measures.  She will also continue to use her inhalers as prescribed.  8. OSA (obstructive sleep apnea) Patient has been diagnosed OSA however is not able to use a CPAP.  She wears that she turned in her machine and is not interested in 1.  We did discuss long-term effects of untreated OSA patient verbalized understanding states she cannot sleep with mask.  9. Normal foot exam Patient's diabetic foot exam performed at this visit.  10. Dysuria - UA/M w/rflx Culture, Routine  General Counseling: Dakisha verbalizes understanding of the findings of todays visit and agrees with plan of treatment. I have discussed any further diagnostic evaluation that may be needed or ordered today. We also reviewed her medications today. she has been encouraged to call the office with any questions or concerns that should arise related to todays visit.   Orders  Placed This Encounter  Procedures  . UA/M w/rflx Culture, Routine  . Ambulatory referral to Gynecology  . POCT INR    No orders of the defined types were placed in this encounter.   Time spent: 25 Minutes   This patient was seen by AOrson GearAGNP-C in Collaboration with Dr FLavera Guiseas a part of collaborative care agreement    AKendell BaneAGNP-C Internal Medicine

## 2018-04-16 NOTE — Addendum Note (Signed)
Addended by: Radene Knee on: 04/16/2018 01:38 PM   Modules accepted: Orders

## 2018-04-23 ENCOUNTER — Ambulatory Visit: Payer: Self-pay

## 2018-04-23 ENCOUNTER — Other Ambulatory Visit: Payer: Self-pay | Admitting: Adult Health

## 2018-04-25 ENCOUNTER — Other Ambulatory Visit: Payer: Self-pay | Admitting: Adult Health

## 2018-04-26 ENCOUNTER — Other Ambulatory Visit: Payer: Self-pay

## 2018-04-28 ENCOUNTER — Other Ambulatory Visit: Payer: Self-pay | Admitting: Adult Health

## 2018-05-01 ENCOUNTER — Other Ambulatory Visit: Payer: Self-pay | Admitting: Adult Health

## 2018-05-01 ENCOUNTER — Ambulatory Visit (INDEPENDENT_AMBULATORY_CARE_PROVIDER_SITE_OTHER): Payer: Medicare Other | Admitting: Nurse Practitioner

## 2018-05-01 ENCOUNTER — Encounter: Payer: Self-pay | Admitting: Nurse Practitioner

## 2018-05-01 VITALS — BP 136/84 | HR 75 | Resp 16 | Ht 65.0 in | Wt 241.4 lb

## 2018-05-01 DIAGNOSIS — E119 Type 2 diabetes mellitus without complications: Secondary | ICD-10-CM

## 2018-05-01 DIAGNOSIS — J449 Chronic obstructive pulmonary disease, unspecified: Secondary | ICD-10-CM

## 2018-05-01 DIAGNOSIS — I10 Essential (primary) hypertension: Secondary | ICD-10-CM | POA: Diagnosis not present

## 2018-05-01 LAB — FSH/LH
FSH: 64.4 m[IU]/mL
LH: 41.1 m[IU]/mL

## 2018-05-01 LAB — SPECIMEN STATUS REPORT

## 2018-05-01 MED ORDER — PHENTERMINE HCL 37.5 MG PO TABS: 37.5000 mg | ORAL_TABLET | Freq: Every day | ORAL | 1 refills | Status: DC

## 2018-05-01 NOTE — Progress Notes (Signed)
[facility] [place], [place] [address]  Internal MEDICINE  Office Visit Note  Patient Name: Brenda Santana  [id number]  [id number]  Date of Service: 05/02/2018  Chief Complaint  Patient presents with  . Medical Management of Chronic Issues    follow up weight management, medication refill    The patient is here for routine follow up of weight management. Has been taking phentermine off and on to help with losing weight. Has done well without negative side effects. She has lost close to 15 pounds since starting this program. In recent weeks, she has gained back four pounds. This is mostly due to increased sweets and carbohydrates intake over the recent holidays. Blood pressure is well controlled. She has no complaint of palpitations or chest pain, or constipation due to taking phentermine.       Current Medication: Outpatient Encounter Medications as of 05/01/2018  Medication Sig Note  . acyclovir (ZOVIRAX) 400 MG tablet Take 1 tablet (400 mg total) by mouth 2 (two) times daily.   Marland Kitchen acyclovir ointment (ZOVIRAX) 5 % APPLY TOPICALLY EVERY 3 (THREE) HOURS.   Marland Kitchen albuterol (PROVENTIL HFA) 108 (90 Base) MCG/ACT inhaler Inhale 2 puffs into the lungs every 6 (six) hours as needed for wheezing or shortness of breath.    . allopurinol (ZYLOPRIM) 300 MG tablet TAKE ONE TAB AT NIGHT FOR GOUT (Patient taking differently: Take 300 mg by mouth at bedtime. )   . aspirin EC 81 MG tablet Take 81 mg by mouth daily.   . BD PEN NEEDLE NANO U/F 32G X 4 MM MISC USE DAILY WITH VICTOZA   . Biotin 5 MG CAPS Take 5 mg by mouth daily.    . cetirizine (ZYRTEC ALLERGY) 10 MG tablet Take 10 mg by mouth daily as needed for allergies.    . chlorthalidone (HYGROTON) 25 MG tablet TAKE 1 TABLET BY MOUTH DAILY EVERY MORNING (Patient taking differently: Take 12.5 mg by mouth daily. )   . clobetasol cream (TEMOVATE) 8.18 % Apply 1 application topically 2 (two) times daily as needed (for eczema on  hands).    . diclofenac sodium (VOLTAREN) 1 % GEL APPLY 4 G TOPICALLY FOUR (4) TIMES A DAY.   Marland Kitchen docusate sodium (COLACE) 100 MG capsule Take 100 mg by mouth daily.    Marland Kitchen doxycycline (VIBRA-TABS) 100 MG tablet Take 100 mg by mouth 2 (two) times daily.  01/31/2018: Continuous  . famotidine (PEPCID) 20 MG tablet Take 20 mg by mouth daily as needed for heartburn or indigestion.    . Ferrous Sulfate (IRON) 325 (65 Fe) MG TABS Take 325 mg by mouth 3 (three) times daily.  01/31/2018: Stopped for procedure  . Fluticasone-Salmeterol (ADVAIR DISKUS) 500-50 MCG/DOSE AEPB Inhale 1 puff into the lungs 2 (two) times daily as needed (for shortness of breath or wheezing).    . gabapentin (NEURONTIN) 800 MG tablet Take 800 mg by mouth 3 (three) times daily as needed (for pain).    . INCRUSE ELLIPTA 62.5 MCG/INH AEPB TAKE 1 PUFF BY MOUTH EVERY DAY   . INVOKAMET 50-500 MG TABS TAKE 1 TABLET BY MOUTH TWICE A DAY   . ipratropium-albuterol (DUONEB) 0.5-2.5 (3) MG/3ML SOLN Take 3 mLs by nebulization every 6 (six) hours as needed (for shortness of breath or wheezing).    Marland Kitchen linaclotide (LINZESS) 145 MCG CAPS capsule Take 1 capsule (145 mcg total) by mouth daily before breakfast.   . lisinopril (PRINIVIL,ZESTRIL) 10 MG tablet TAKE 1 TAB  DAILY IN MORNING (Patient taking differently: Take 10 mg by mouth daily. )   . metoprolol tartrate (LOPRESSOR) 50 MG tablet TAKE 1 TABLET BY MOUTH TWICE A DAY (Patient taking differently: Take 50 mg by mouth 2 (two) times daily. )   . montelukast (SINGULAIR) 10 MG tablet TAKE 1 TABLET BY MOUTH DAILY FOR ASTHMA (Patient taking differently: Take 10 mg by mouth daily. )   . oxymetazoline (AFRIN) 0.05 % nasal spray Place 1 spray into both nostrils 2 (two) times daily as needed for congestion.   . predniSONE (DELTASONE) 5 MG tablet TAKE 2 TABLETS BY MOUTH EVERY DAY   . SYMBICORT 160-4.5 MCG/ACT inhaler INHALE 2 PUFF TWICE A DAY   . traMADol (ULTRAM) 50 MG tablet Take 50 mg by mouth every 8 (eight)  hours as needed for moderate pain.    Marland Kitchen tretinoin (RETIN-A) 0.025 % cream Apply 1 application topically at bedtime as needed (for eczema on face).    Marland Kitchen VICTOZA 18 MG/3ML SOPN TAKE 1.8 MG EVERY MORNING (Patient taking differently: Inject 1.8 mg into the skin daily. )   . warfarin (COUMADIN) 5 MG tablet TAKE 1 AND 1/2 TABS (7.5) DAILY OR AS DIRECTED   . [DISCONTINUED] phentermine 37.5 MG capsule Take 1 capsule (37.5 mg total) by mouth every morning.   . phentermine (ADIPEX-P) 37.5 MG tablet Take 1 tablet (37.5 mg total) by mouth daily before breakfast.    No facility-administered encounter medications on file as of 05/01/2018.     Surgical History: Past Surgical History:  Procedure Laterality Date  . APPENDECTOMY    . COLONOSCOPY WITH PROPOFOL N/A 02/02/2018   Procedure: COLONOSCOPY WITH PROPOFOL;  Surgeon: Jonathon Bellows, MD;  Location: [facility] ENDOSCOPY;  Service: Gastroenterology;  Laterality: N/A;  . LAPAROSCOPIC APPENDECTOMY N/A 02/05/2018   Procedure: APPENDECTOMY LAPAROSCOPIC;  Surgeon: Jules Husbands, MD;  Location: [facility] ORS;  Service: General;  Laterality: N/A;  . right arm surgery    . TUBAL LIGATION      Medical History: Past Medical History:  Diagnosis Date  . Asthma   . Atopic dermatitis   . Constipation   . COPD (chronic obstructive pulmonary disease) ([place])   . Diabetes mellitus without complication ([place])   . DVT (deep venous thrombosis) ([place])   . GERD (gastroesophageal reflux disease)   . Hyperlipidemia   . Hypertension   . Rheumatoid arthritis ([place])     Family History: Family History  Problem Relation Age of Onset  . Breast cancer Mother   . Hypertension Mother   . Diabetes Mother   . Hypertension Father   . Heart failure Father     Social History   Socioeconomic History  . Marital status: Single    Spouse name: Not on file  . Number of children: Not on file  . Years of education: Not on file  . Highest education level: Not on file  Occupational History   . Not on file  Social Needs  . Financial resource strain: Not on file  . Food insecurity:    Worry: Not on file    Inability: Not on file  . Transportation needs:    Medical: Not on file    Non-medical: Not on file  Tobacco Use  . Smoking status: Former Research scientist (life sciences)  . Smokeless tobacco: Never Used  Substance and Sexual Activity  . Alcohol use: No  . Drug use: No  . Sexual activity: Yes  Lifestyle  . Physical activity:    Days per week:  Not on file    Minutes per session: Not on file  . Stress: Not on file  Relationships  . Social connections:    Talks on phone: Not on file    Gets together: Not on file    Attends religious service: Not on file    Active member of club or organization: Not on file    Attends meetings of clubs or organizations: Not on file    Relationship status: Not on file  . Intimate partner violence:    Fear of current or ex partner: Not on file    Emotionally abused: Not on file    Physically abused: Not on file    Forced sexual activity: Not on file  Other Topics Concern  . Not on file  Social History Narrative  . Not on file      Review of Systems  Constitutional: Negative for activity change, chills, diaphoresis, fatigue, fever and unexpected weight change.       Weight gain 4 pounds since her most recent visit.   HENT: Negative for congestion, ear pain, postnasal drip, sinus pressure, sinus pain and sore throat.   Respiratory: Negative for cough, shortness of breath and wheezing.        Uses inhalers as needed and as prescribed   Cardiovascular: Negative for chest pain, palpitations and leg swelling.  Gastrointestinal: Negative for abdominal pain, constipation, diarrhea, nausea and vomiting.  Endocrine: Negative for cold intolerance, heat intolerance, polydipsia, polyphagia and polyuria.       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   Skin:  Negative for color change and rash.  Allergic/Immunologic: Positive for environmental allergies. Negative for food allergies.  Neurological: Negative for dizziness and headaches.  Hematological: Negative for adenopathy. Does not bruise/bleed easily.  Psychiatric/Behavioral: Negative for agitation, behavioral problems (depression) and sleep disturbance. The patient is not nervous/anxious.     Today's Vitals   05/01/18 0946  BP: 136/84  Pulse: 75  Resp: 16  SpO2: 96%  Weight: 241 lb 6.4 oz (109.5 kg)  Height: 5\' 5"  (1.651 m)    Physical Exam Vitals signs and nursing note reviewed.  Constitutional:      General: She is not in acute distress.    Appearance: Normal appearance. She is well-developed. She is not diaphoretic.  HENT:     Head: Normocephalic and atraumatic.     Mouth/Throat:     Pharynx: No oropharyngeal exudate.  Eyes:     Pupils: Pupils are equal, round, and reactive to light.  Neck:     Musculoskeletal: Normal range of motion and neck supple.     Thyroid: No thyromegaly.     Vascular: No JVD.     Trachea: No tracheal deviation.  Cardiovascular:     Rate and Rhythm: Normal rate and regular rhythm.     Heart sounds: Normal heart sounds. No murmur. No friction rub. No gallop.   Pulmonary:     Effort: Pulmonary effort is normal. No respiratory distress.     Breath sounds: Normal breath sounds. No wheezing or rales.  Chest:     Chest wall: No tenderness.  Abdominal:     General: Bowel sounds are normal.     Palpations: Abdomen is soft.     Tenderness: There is no abdominal tenderness.  Lymphadenopathy:     Cervical: No cervical adenopathy.  Skin:    General:  Skin is warm and dry.     Capillary Refill: Capillary refill takes 2 to 3 seconds.  Neurological:     Mental Status: She is alert and oriented to person, place, and time.     Cranial Nerves: No cranial nerve deficit.  Psychiatric:        Mood and Affect: Mood normal.        Behavior: Behavior normal.         Thought Content: Thought content normal.        Judgment: Judgment normal.    Assessment/Plan: 1. Diabetes mellitus without complication ([place]) Blood sugars well controlled. Continue dm medication as prescribed. Check HgbA1c at next visit.   2. Essential (primary) hypertension Stable. Continue bp medication as prescribed   3. Morbid obesity ([place]) May continue phentermine daily. Limit calorie intake to 1500 calories per day. Incorporate exercise into daily routine. - phentermine (ADIPEX-P) 37.5 MG tablet; Take 1 tablet (37.5 mg total) by mouth daily before breakfast.  Dispense: 30 tablet; Refill: 1  4. Chronic obstructive pulmonary disease, unspecified COPD type ([place]) Continue inhalers and respiratory medication as prescribed   General Counseling: Jadalynn verbalizes understanding of the findings of todays visit and agrees with plan of treatment. I have discussed any further diagnostic evaluation that may be needed or ordered today. We also reviewed her medications today. she has been encouraged to call the office with any questions or concerns that should arise related to todays visit.   There is a liability release in patients' chart. There has been a 10 minute discussion about the side effects including but not limited to elevated blood pressure, anxiety, lack of sleep and dry mouth. Pt understands and will like to start/continue on appetite suppressant at this time. There will be one month RX given at the time of visit with proper follow up. [place] diet plan with restricted calories is given to the pt. Pt understands and agrees with  plan of treatment  This patient was seen by Leretha Pol FNP Collaboration with Dr Lavera Guise as a part of collaborative care agreement  Meds ordered this encounter  Medications  . phentermine (ADIPEX-P) 37.5 MG tablet    Sig: Take 1 tablet (37.5 mg total) by mouth daily before breakfast.    Dispense:  30 tablet    Refill:  1    Patient given  GoodRx card. Price per Good Rx listed as $9.35.    Order Specific Question:   Supervising Provider    Answer:   Lavera Guise [[id number]]    Time spent: 61 Minutes      Dr Lavera Guise Internal medicine

## 2018-05-03 ENCOUNTER — Encounter: Payer: Medicare Other | Admitting: Obstetrics and Gynecology

## 2018-05-04 ENCOUNTER — Other Ambulatory Visit: Payer: Self-pay

## 2018-05-04 MED ORDER — ACYCLOVIR 400 MG PO TABS: 400.0000 mg | ORAL_TABLET | Freq: Two times a day (BID) | ORAL | 1 refills | Status: DC

## 2018-05-09 ENCOUNTER — Encounter: Payer: Self-pay | Admitting: Nurse Practitioner

## 2018-05-10 ENCOUNTER — Encounter: Payer: Medicare Other | Admitting: Obstetrics and Gynecology

## 2018-05-20 ENCOUNTER — Other Ambulatory Visit: Payer: Self-pay | Admitting: Adult Health

## 2018-05-28 ENCOUNTER — Encounter: Payer: Self-pay | Admitting: Adult Health

## 2018-05-28 ENCOUNTER — Ambulatory Visit (INDEPENDENT_AMBULATORY_CARE_PROVIDER_SITE_OTHER): Payer: Medicare Other | Admitting: Adult Health

## 2018-05-28 ENCOUNTER — Other Ambulatory Visit: Payer: Self-pay | Admitting: Adult Health

## 2018-05-28 ENCOUNTER — Telehealth: Payer: Self-pay | Admitting: Adult Health

## 2018-05-28 VITALS — BP 122/88 | HR 100 | Resp 16 | Ht 65.0 in | Wt 240.0 lb

## 2018-05-28 DIAGNOSIS — Z7901 Long term (current) use of anticoagulants: Secondary | ICD-10-CM | POA: Diagnosis not present

## 2018-05-28 DIAGNOSIS — E1165 Type 2 diabetes mellitus with hyperglycemia: Secondary | ICD-10-CM

## 2018-05-28 DIAGNOSIS — I10 Essential (primary) hypertension: Secondary | ICD-10-CM

## 2018-05-28 LAB — POCT GLYCOSYLATED HEMOGLOBIN (HGB A1C): Hemoglobin A1C: 7.2 % — AB (ref 4.0–5.6)

## 2018-05-28 LAB — POCT INR: INR: 4 — AB (ref 2.0–3.0)

## 2018-05-28 MED ORDER — UMECLIDINIUM BROMIDE 62.5 MCG/INH IN AEPB: 1.0000 | INHALATION_SPRAY | Freq: Every day | RESPIRATORY_TRACT | 1 refills | Status: DC

## 2018-05-28 MED ORDER — ALBUTEROL SULFATE HFA 108 (90 BASE) MCG/ACT IN AERS: 2.0000 | INHALATION_SPRAY | Freq: Four times a day (QID) | RESPIRATORY_TRACT | 2 refills | Status: DC | PRN

## 2018-05-28 MED ORDER — ALLOPURINOL 300 MG PO TABS: ORAL_TABLET | ORAL | 3 refills | Status: DC

## 2018-05-28 MED ORDER — IPRATROPIUM-ALBUTEROL 20-100 MCG/ACT IN AERS: 1.0000 | INHALATION_SPRAY | Freq: Four times a day (QID) | RESPIRATORY_TRACT | 2 refills | Status: DC

## 2018-05-28 MED ORDER — PHENTERMINE HCL 37.5 MG PO TABS: 37.5000 mg | ORAL_TABLET | Freq: Every day | ORAL | 0 refills | Status: DC

## 2018-05-28 NOTE — Progress Notes (Signed)
[facility] [place],  [id number]  Internal MEDICINE  Office Visit Note  Patient Name: Brenda Santana  [id number]  [id number]  Date of Service: 06/03/2018  Chief Complaint  Patient presents with  . Diabetes    A1C 7.2  . Hypertension  . Hyperlipidemia  . Medical Management of Chronic Issues    weight loss refill   . Atrial Fibrillation    inr 4.0 Pt 47.9 patient was not taking half on Tuesdays , states she forgot what Takota Cahalan told her to do.    HPI Patient is here for follow-up on diabetes, hypertension, hyperlipidemia, as well as medical management of weight loss and chronic anticoagulation due to atrial fibrillation.  Patient's A1c 7.2 today.  Her blood pressures current well controlled as well as her hyperlipidemia.  She is requesting to restart phentermine at this time which will be provided today.  Also her INR was 4 today because she apparently forgot the instructions that gave her at her last visit.     Current Medication: Outpatient Encounter Medications as of 05/28/2018  Medication Sig Note  . acyclovir (ZOVIRAX) 400 MG tablet Take 1 tablet (400 mg total) by mouth 2 (two) times daily.   Marland Kitchen acyclovir ointment (ZOVIRAX) 5 % APPLY TOPICALLY EVERY 3 (THREE) HOURS.   Marland Kitchen albuterol (PROVENTIL HFA) 108 (90 Base) MCG/ACT inhaler Inhale 2 puffs into the lungs every 6 (six) hours as needed for wheezing or shortness of breath.   Marland Kitchen aspirin EC 81 MG tablet Take 81 mg by mouth daily.   . BD PEN NEEDLE NANO U/F 32G X 4 MM MISC USE DAILY WITH VICTOZA   . Biotin 5 MG CAPS Take 5 mg by mouth daily.    . cetirizine (ZYRTEC ALLERGY) 10 MG tablet Take 10 mg by mouth daily as needed for allergies.    . chlorthalidone (HYGROTON) 25 MG tablet TAKE 1 TABLET BY MOUTH DAILY EVERY MORNING (Patient taking differently: Take 12.5 mg by mouth daily. )   . clobetasol cream (TEMOVATE) 7.25 % Apply 1 application topically 2 (two) times daily as needed (for eczema on hands).    .  diclofenac sodium (VOLTAREN) 1 % GEL APPLY 4 G TOPICALLY FOUR (4) TIMES A DAY.   Marland Kitchen docusate sodium (COLACE) 100 MG capsule Take 100 mg by mouth daily.    Marland Kitchen doxycycline (VIBRA-TABS) 100 MG tablet Take 100 mg by mouth 2 (two) times daily.  01/31/2018: Continuous  . famotidine (PEPCID) 20 MG tablet Take 20 mg by mouth daily as needed for heartburn or indigestion.    . Ferrous Sulfate (IRON) 325 (65 Fe) MG TABS Take 325 mg by mouth 3 (three) times daily.  01/31/2018: Stopped for procedure  . Fluticasone-Salmeterol (ADVAIR DISKUS) 500-50 MCG/DOSE AEPB Inhale 1 puff into the lungs 2 (two) times daily as needed (for shortness of breath or wheezing).    . gabapentin (NEURONTIN) 800 MG tablet Take 800 mg by mouth 3 (three) times daily as needed (for pain).    . INVOKAMET 50-500 MG TABS TAKE 1 TABLET BY MOUTH TWICE A DAY   . ipratropium-albuterol (DUONEB) 0.5-2.5 (3) MG/3ML SOLN Take 3 mLs by nebulization every 6 (six) hours as needed (for shortness of breath or wheezing).    Marland Kitchen linaclotide (LINZESS) 145 MCG CAPS capsule Take 1 capsule (145 mcg total) by mouth daily before breakfast.   . lisinopril (PRINIVIL,ZESTRIL) 10 MG tablet TAKE 1 TAB DAILY IN MORNING (Patient taking differently: Take 10 mg by mouth  daily. )   . metoprolol tartrate (LOPRESSOR) 50 MG tablet TAKE 1 TABLET BY MOUTH TWICE A DAY (Patient taking differently: Take 50 mg by mouth 2 (two) times daily. )   . montelukast (SINGULAIR) 10 MG tablet TAKE 1 TABLET BY MOUTH DAILY FOR ASTHMA (Patient taking differently: Take 10 mg by mouth daily. )   . oxymetazoline (AFRIN) 0.05 % nasal spray Place 1 spray into both nostrils 2 (two) times daily as needed for congestion.   . predniSONE (DELTASONE) 5 MG tablet TAKE 2 TABLETS BY MOUTH EVERY DAY   . SYMBICORT 160-4.5 MCG/ACT inhaler INHALE 2 PUFF TWICE A DAY   . traMADol (ULTRAM) 50 MG tablet Take 50 mg by mouth every 8 (eight) hours as needed for moderate pain.    Marland Kitchen tretinoin (RETIN-A) 0.025 % cream Apply 1  application topically at bedtime as needed (for eczema on face).    Marland Kitchen umeclidinium bromide (INCRUSE ELLIPTA) 62.5 MCG/INH AEPB Inhale 1 puff into the lungs daily.   Marland Kitchen VICTOZA 18 MG/3ML SOPN TAKE 1.8 MG EVERY MORNING (Patient taking differently: Inject 1.8 mg into the skin daily. )   . warfarin (COUMADIN) 5 MG tablet TAKE 1 AND 1/2 TABS (7.5) DAILY OR AS DIRECTED   . [DISCONTINUED] albuterol (PROVENTIL HFA) 108 (90 Base) MCG/ACT inhaler Inhale 2 puffs into the lungs every 6 (six) hours as needed for wheezing or shortness of breath.    . [DISCONTINUED] allopurinol (ZYLOPRIM) 300 MG tablet TAKE ONE TAB AT NIGHT FOR GOUT (Patient taking differently: Take 300 mg by mouth at bedtime. )   . [DISCONTINUED] INCRUSE ELLIPTA 62.5 MCG/INH AEPB TAKE 1 PUFF BY MOUTH EVERY DAY   . [DISCONTINUED] phentermine (ADIPEX-P) 37.5 MG tablet Take 1 tablet (37.5 mg total) by mouth daily before breakfast.   . [DISCONTINUED] phentermine (ADIPEX-P) 37.5 MG tablet Take 1 tablet (37.5 mg total) by mouth daily before breakfast.   . Ipratropium-Albuterol (COMBIVENT RESPIMAT) 20-100 MCG/ACT AERS respimat Inhale 1 puff into the lungs every 6 (six) hours.   . phentermine (ADIPEX-P) 37.5 MG tablet Take 1 tablet (37.5 mg total) by mouth daily before breakfast.    No facility-administered encounter medications on file as of 05/28/2018.     Surgical History: Past Surgical History:  Procedure Laterality Date  . APPENDECTOMY    . COLONOSCOPY WITH PROPOFOL N/A 02/02/2018   Procedure: COLONOSCOPY WITH PROPOFOL;  Surgeon: Jonathon Bellows, MD;  Location: [facility] ENDOSCOPY;  Service: Gastroenterology;  Laterality: N/A;  . LAPAROSCOPIC APPENDECTOMY N/A 02/05/2018   Procedure: APPENDECTOMY LAPAROSCOPIC;  Surgeon: Jules Husbands, MD;  Location: [facility] ORS;  Service: General;  Laterality: N/A;  . right arm surgery    . TUBAL LIGATION      Medical History: Past Medical History:  Diagnosis Date  . Asthma   . Atopic dermatitis   . Constipation   .  COPD (chronic obstructive pulmonary disease) ([place])   . Diabetes mellitus without complication ([place])   . DVT (deep venous thrombosis) ([place])   . GERD (gastroesophageal reflux disease)   . Hyperlipidemia   . Hypertension   . Rheumatoid arthritis ([place])     Family History: Family History  Problem Relation Age of Onset  . Breast cancer Mother   . Hypertension Mother   . Diabetes Mother   . Hypertension Father   . Heart failure Father     Social History   Socioeconomic History  . Marital status: Single    Spouse name: Not on file  . Number of  children: Not on file  . Years of education: Not on file  . Highest education level: Not on file  Occupational History  . Not on file  Social Needs  . Financial resource strain: Not on file  . Food insecurity:    Worry: Not on file    Inability: Not on file  . Transportation needs:    Medical: Not on file    Non-medical: Not on file  Tobacco Use  . Smoking status: Former Research scientist (life sciences)  . Smokeless tobacco: Never Used  Substance and Sexual Activity  . Alcohol use: No  . Drug use: No  . Sexual activity: Yes  Lifestyle  . Physical activity:    Days per week: Not on file    Minutes per session: Not on file  . Stress: Not on file  Relationships  . Social connections:    Talks on phone: Not on file    Gets together: Not on file    Attends religious service: Not on file    Active member of club or organization: Not on file    Attends meetings of clubs or organizations: Not on file    Relationship status: Not on file  . Intimate partner violence:    Fear of current or ex partner: Not on file    Emotionally abused: Not on file    Physically abused: Not on file    Forced sexual activity: Not on file  Other Topics Concern  . Not on file  Social History Narrative  . Not on file      Review of Systems  Constitutional: Negative for chills, fatigue and unexpected weight change.  HENT: Negative for congestion, rhinorrhea, sneezing and  sore throat.   Eyes: Negative for photophobia, pain and redness.  Respiratory: Negative for cough, chest tightness and shortness of breath.   Cardiovascular: Negative for chest pain and palpitations.  Gastrointestinal: Negative for abdominal pain, constipation, diarrhea, nausea and vomiting.  Endocrine: Negative.   Genitourinary: Negative for dysuria and frequency.  Musculoskeletal: Negative for arthralgias, back pain, joint swelling and neck pain.  Skin: Negative for rash.  Allergic/Immunologic: Negative.   Neurological: Negative for tremors and numbness.  Hematological: Negative for adenopathy. Does not bruise/bleed easily.  Psychiatric/Behavioral: Negative for behavioral problems and sleep disturbance. The patient is not nervous/anxious.     Vital Signs: BP 122/88   Pulse 100   Resp 16   Ht 5\' 5"  (1.651 m)   Wt 240 lb (108.9 kg)   SpO2 98%   BMI 39.94 kg/m    Physical Exam Vitals signs and nursing note reviewed.  Constitutional:      General: She is not in acute distress.    Appearance: She is well-developed. She is not diaphoretic.  HENT:     Head: Normocephalic and atraumatic.     Mouth/Throat:     Pharynx: No oropharyngeal exudate.  Eyes:     Pupils: Pupils are equal, round, and reactive to light.  Neck:     Musculoskeletal: Normal range of motion and neck supple.     Thyroid: No thyromegaly.     Vascular: No JVD.     Trachea: No tracheal deviation.  Cardiovascular:     Rate and Rhythm: Normal rate and regular rhythm.     Heart sounds: Normal heart sounds. No murmur. No friction rub. No gallop.   Pulmonary:     Effort: Pulmonary effort is normal. No respiratory distress.     Breath sounds: Normal breath sounds. No wheezing  or rales.  Chest:     Chest wall: No tenderness.  Abdominal:     Palpations: Abdomen is soft.     Tenderness: There is no abdominal tenderness. There is no guarding.  Musculoskeletal: Normal range of motion.  Lymphadenopathy:      Cervical: No cervical adenopathy.  Skin:    General: Skin is warm and dry.  Neurological:     Mental Status: She is alert and oriented to person, place, and time.     Cranial Nerves: No cranial nerve deficit.  Psychiatric:        Behavior: Behavior normal.        Thought Content: Thought content normal.        Judgment: Judgment normal.    Assessment/Plan: 1. Uncontrolled type 2 diabetes mellitus with hyperglycemia ([place]) Continue current medications at this time.  Reiterated the importance of diet control along with medications for her diabetes.  Patient verbalized understanding. - POCT HgB A1C  2. Encounter for current long-term use of anticoagulants Patient will skip today's dose of Coumadin.  She will take a half dose tomorrow and resume her normal 1.5 tablet dose beyond that daily.  She will come back for a recheck in 2 weeks.  - POCT INR  3. Essential (primary) hypertension Stable, continue medications  4. Morbid obesity (HCC) Obesity Counseling: Risk Assessment: An assessment of behavioral risk factors was made today and includes lack of exercise sedentary lifestyle, lack of portion control and poor dietary habits.  Risk Modification Advice: She was counseled on portion control guidelines. Restricting daily caloric intake to. . The detrimental long term effects of obesity on her health and ongoing poor compliance was also discussed with the patient.    General Counseling: Brenda Santana understanding of the findings of todays visit and agrees with plan of treatment. I have discussed any further diagnostic evaluation that may be needed or ordered today. We also reviewed her medications today. she has been encouraged to call the office with any questions or concerns that should arise related to todays visit.    Orders Placed This Encounter  Procedures  . POCT INR  . POCT HgB A1C    Meds ordered this encounter  Medications  . DISCONTD: phentermine (ADIPEX-P) 37.5 MG  tablet    Sig: Take 1 tablet (37.5 mg total) by mouth daily before breakfast.    Dispense:  30 tablet    Refill:  0    Patient given GoodRx card. Price per Good Rx listed as $9.35.  . umeclidinium bromide (INCRUSE ELLIPTA) 62.5 MCG/INH AEPB    Sig: Inhale 1 puff into the lungs daily.    Dispense:  90 each    Refill:  1  . albuterol (PROVENTIL HFA) 108 (90 Base) MCG/ACT inhaler    Sig: Inhale 2 puffs into the lungs every 6 (six) hours as needed for wheezing or shortness of breath.    Dispense:  3 Inhaler    Refill:  2  . Ipratropium-Albuterol (COMBIVENT RESPIMAT) 20-100 MCG/ACT AERS respimat    Sig: Inhale 1 puff into the lungs every 6 (six) hours.    Dispense:  3 Inhaler    Refill:  2  . phentermine (ADIPEX-P) 37.5 MG tablet    Sig: Take 1 tablet (37.5 mg total) by mouth daily before breakfast.    Dispense:  30 tablet    Refill:  0    Time spent: 25 Minutes   This patient was seen by Orson Gear AGNP-C in Collaboration with  Dr Lavera Guise as a part of collaborative care agreement     Kendell Bane AGNP-C Internal medicine

## 2018-05-28 NOTE — Telephone Encounter (Signed)
Can you please send in her prescription for phentermine to [facility] instead of [facility] , cheaper at [facility]

## 2018-05-29 MED ORDER — PHENTERMINE HCL 37.5 MG PO TABS: 37.5000 mg | ORAL_TABLET | Freq: Every day | ORAL | 0 refills | Status: DC

## 2018-06-03 ENCOUNTER — Encounter: Payer: Self-pay | Admitting: Adult Health

## 2018-06-06 ENCOUNTER — Other Ambulatory Visit: Payer: Self-pay

## 2018-06-06 MED ORDER — ACYCLOVIR 400 MG PO TABS: 400.0000 mg | ORAL_TABLET | Freq: Two times a day (BID) | ORAL | 1 refills | Status: DC

## 2018-06-11 ENCOUNTER — Ambulatory Visit: Payer: Self-pay

## 2018-06-25 ENCOUNTER — Ambulatory Visit (INDEPENDENT_AMBULATORY_CARE_PROVIDER_SITE_OTHER): Payer: Medicare Other | Admitting: Internal Medicine

## 2018-06-25 ENCOUNTER — Encounter: Payer: Self-pay | Admitting: Internal Medicine

## 2018-06-25 VITALS — BP 102/68 | HR 105 | Temp 97.7°F | Resp 18 | Ht 65.0 in | Wt 239.0 lb

## 2018-06-25 DIAGNOSIS — I10 Essential (primary) hypertension: Secondary | ICD-10-CM

## 2018-06-25 DIAGNOSIS — R062 Wheezing: Secondary | ICD-10-CM

## 2018-06-25 DIAGNOSIS — J988 Other specified respiratory disorders: Secondary | ICD-10-CM | POA: Diagnosis not present

## 2018-06-25 MED ORDER — AZITHROMYCIN 250 MG PO TABS: ORAL_TABLET | ORAL | 0 refills | Status: DC

## 2018-06-25 MED ORDER — PREDNISONE 10 MG PO TABS: ORAL_TABLET | ORAL | 0 refills | Status: DC

## 2018-06-25 MED ORDER — METHYLPREDNISOLONE ACETATE 80 MG/ML IJ SUSP
80.0000 mg | Freq: Once | INTRAMUSCULAR | Status: AC
  Administered 2018-06-25: 40 mg via INTRAMUSCULAR

## 2018-06-25 NOTE — Patient Instructions (Signed)

## 2018-06-25 NOTE — Progress Notes (Addendum)
[facility] [place], [place] [address]  Pulmonary Sleep Medicine   Office Visit Note  Patient Name: Brenda Santana DOB: 28-Mar-1961 MRN [id number]  Date of Service: 06/25/2018  Complaints/HPI: Patient reports she is here for a sick visit.  She has been experiencing cough and shortness of breath.  She reports orthopnea at night.  She has been using her inhalers and nebulizers however feels like she is getting worse.  She denies any fever, chest pain or palpitations.  ROS  General: (-) fever, (-) chills, (-) night sweats, (-) weakness Skin: (-) rashes, (-) itching,. Eyes: (-) visual changes, (-) redness, (-) itching. Nose and Sinuses: (-) nasal stuffiness or itchiness, (-) postnasal drip, (-) nosebleeds, (-) sinus trouble. Mouth and Throat: (-) sore throat, (-) hoarseness. Neck: (-) swollen glands, (-) enlarged thyroid, (-) neck pain. Respiratory: + cough, (-) bloody sputum, + shortness of breath, + wheezing. Cardiovascular: - ankle swelling, (-) chest pain. Lymphatic: (-) lymph node enlargement. Neurologic: (-) numbness, (-) tingling. Psychiatric: (-) anxiety, (-) depression   Current Medication: Outpatient Encounter Medications as of 06/25/2018  Medication Sig Note  . acyclovir (ZOVIRAX) 400 MG tablet Take 1 tablet (400 mg total) by mouth 2 (two) times daily.   Marland Kitchen acyclovir ointment (ZOVIRAX) 5 % APPLY TOPICALLY EVERY 3 (THREE) HOURS.   Marland Kitchen albuterol (PROVENTIL HFA) 108 (90 Base) MCG/ACT inhaler Inhale 2 puffs into the lungs every 6 (six) hours as needed for wheezing or shortness of breath.   . allopurinol (ZYLOPRIM) 300 MG tablet TAKE ONE TAB AT NIGHT FOR GOUT   . aspirin EC 81 MG tablet Take 81 mg by mouth daily.   . BD PEN NEEDLE NANO U/F 32G X 4 MM MISC USE DAILY WITH VICTOZA   . Biotin 5 MG CAPS Take 5 mg by mouth daily.    . cetirizine (ZYRTEC ALLERGY) 10 MG tablet Take 10 mg by mouth daily as needed for allergies.    . chlorthalidone (HYGROTON) 25  MG tablet TAKE 1 TABLET BY MOUTH DAILY EVERY MORNING (Patient taking differently: Take 12.5 mg by mouth daily. )   . clobetasol cream (TEMOVATE) 8.31 % Apply 1 application topically 2 (two) times daily as needed (for eczema on hands).    . diclofenac sodium (VOLTAREN) 1 % GEL APPLY 4 G TOPICALLY FOUR (4) TIMES A DAY.   Marland Kitchen docusate sodium (COLACE) 100 MG capsule Take 100 mg by mouth daily.    Marland Kitchen doxycycline (VIBRA-TABS) 100 MG tablet Take 100 mg by mouth 2 (two) times daily.  01/31/2018: Continuous  . famotidine (PEPCID) 20 MG tablet Take 20 mg by mouth daily as needed for heartburn or indigestion.    . Ferrous Sulfate (IRON) 325 (65 Fe) MG TABS Take 325 mg by mouth 3 (three) times daily.  01/31/2018: Stopped for procedure  . Fluticasone-Salmeterol (ADVAIR DISKUS) 500-50 MCG/DOSE AEPB Inhale 1 puff into the lungs 2 (two) times daily as needed (for shortness of breath or wheezing).    . gabapentin (NEURONTIN) 800 MG tablet Take 800 mg by mouth 3 (three) times daily as needed (for pain).    . INVOKAMET 50-500 MG TABS TAKE 1 TABLET BY MOUTH TWICE A DAY   . Ipratropium-Albuterol (COMBIVENT RESPIMAT) 20-100 MCG/ACT AERS respimat Inhale 1 puff into the lungs every 6 (six) hours.   Marland Kitchen ipratropium-albuterol (DUONEB) 0.5-2.5 (3) MG/3ML SOLN Take 3 mLs by nebulization every 6 (six) hours as needed (for shortness of breath or wheezing).    Marland Kitchen linaclotide (  LINZESS) 145 MCG CAPS capsule Take 1 capsule (145 mcg total) by mouth daily before breakfast.   . lisinopril (PRINIVIL,ZESTRIL) 10 MG tablet TAKE 1 TAB DAILY IN MORNING (Patient taking differently: Take 10 mg by mouth daily. )   . metoprolol tartrate (LOPRESSOR) 50 MG tablet TAKE 1 TABLET BY MOUTH TWICE A DAY (Patient taking differently: Take 50 mg by mouth 2 (two) times daily. )   . montelukast (SINGULAIR) 10 MG tablet TAKE 1 TABLET BY MOUTH DAILY FOR ASTHMA (Patient taking differently: Take 10 mg by mouth daily. )   . oxymetazoline (AFRIN) 0.05 % nasal spray Place  1 spray into both nostrils 2 (two) times daily as needed for congestion.   . phentermine (ADIPEX-P) 37.5 MG tablet Take 1 tablet (37.5 mg total) by mouth daily before breakfast.   . predniSONE (DELTASONE) 5 MG tablet TAKE 2 TABLETS BY MOUTH EVERY DAY   . SYMBICORT 160-4.5 MCG/ACT inhaler INHALE 2 PUFF TWICE A DAY   . traMADol (ULTRAM) 50 MG tablet Take 50 mg by mouth every 8 (eight) hours as needed for moderate pain.    Marland Kitchen tretinoin (RETIN-A) 0.025 % cream Apply 1 application topically at bedtime as needed (for eczema on face).    Marland Kitchen umeclidinium bromide (INCRUSE ELLIPTA) 62.5 MCG/INH AEPB Inhale 1 puff into the lungs daily.   Marland Kitchen VICTOZA 18 MG/3ML SOPN TAKE 1.8 MG EVERY MORNING (Patient taking differently: Inject 1.8 mg into the skin daily. )   . warfarin (COUMADIN) 5 MG tablet TAKE 1 AND 1/2 TABS (7.5) DAILY OR AS DIRECTED   . azithromycin (ZITHROMAX) 250 MG tablet Take as directed   . predniSONE (DELTASONE) 10 MG tablet Use per dose pack   . [EXPIRED] methylPREDNISolone acetate (DEPO-MEDROL) injection 80 mg     No facility-administered encounter medications on file as of 06/25/2018.     Surgical History: Past Surgical History:  Procedure Laterality Date  . APPENDECTOMY    . COLONOSCOPY WITH PROPOFOL N/A 02/02/2018   Procedure: COLONOSCOPY WITH PROPOFOL;  Surgeon: Jonathon Bellows, MD;  Location[facility] ENDOSCOPY;  Service: Gastroenterology;  Laterality: N/A;  . LAPAROSCOPIC APPENDECTOMY N/A 02/05/2018   Procedure: APPENDECTOMY LAPAROSCOPIC;  Surgeon: Jules Husbands, MD;  Location: [facility] ORS;  Service: General;  Laterality: N/A;  . right arm surgery    . TUBAL LIGATION      Medical History: Past Medical History:  Diagnosis Date  . Asthma   . Atopic dermatitis   . Constipation   . COPD (chronic obstructive pulmonary disease) ([place])   . Diabetes mellitus without complication ([place])   . DVT (deep venous thrombosis) ([place])   . GERD (gastroesophageal reflux disease)   . Hyperlipidemia   .  Hypertension   . Rheumatoid arthritis ([place])     Family History: Family History  Problem Relation Age of Onset  . Breast cancer Mother   . Hypertension Mother   . Diabetes Mother   . Hypertension Father   . Heart failure Father     Social History: Social History   Socioeconomic History  . Marital status: Single    Spouse name: Not on file  . Number of children: Not on file  . Years of education: Not on file  . Highest education level: Not on file  Occupational History  . Not on file  Social Needs  . Financial resource strain: Not on file  . Food insecurity:    Worry: Not on file    Inability: Not on file  . Transportation  needs:    Medical: Not on file    Non-medical: Not on file  Tobacco Use  . Smoking status: Former Research scientist (life sciences)  . Smokeless tobacco: Never Used  Substance and Sexual Activity  . Alcohol use: No  . Drug use: No  . Sexual activity: Yes  Lifestyle  . Physical activity:    Days per week: Not on file    Minutes per session: Not on file  . Stress: Not on file  Relationships  . Social connections:    Talks on phone: Not on file    Gets together: Not on file    Attends religious service: Not on file    Active member of club or organization: Not on file    Attends meetings of clubs or organizations: Not on file    Relationship status: Not on file  . Intimate partner violence:    Fear of current or ex partner: Not on file    Emotionally abused: Not on file    Physically abused: Not on file    Forced sexual activity: Not on file  Other Topics Concern  . Not on file  Social History Narrative  . Not on file    Vital Signs: Blood pressure 102/68, pulse (!) 105, temperature 97.7 F (36.5 C), temperature source Oral, resp. rate 18, height 5\' 5"  (1.651 m), weight 239 lb (108.4 kg), SpO2 96 %.  Examination: General Appearance: The patient is well-developed, well-nourished, and in no distress. Skin: Gross inspection of skin unremarkable. Head:  normocephalic, no gross deformities. Eyes: no gross deformities noted. ENT: ears appear grossly normal no exudates. Neck: Supple. No thyromegaly. No LAD. Respiratory: course breath sounds. Cardiovascular: Normal S1 and S2 without murmur or rub. Extremities: No cyanosis. pulses are equal. Neurologic: Alert and oriented. No involuntary movements.  LABS: Recent Results (from the past 2160 hour(s))  CBC with Differential/Platelet     Status: None   Collection Time: 04/10/18  9:43 AM  Result Value Ref Range   WBC 8.1 3.4 - 10.8 x10E3/uL   RBC 4.53 3.77 - 5.28 x10E6/uL   Hemoglobin 14.2 11.1 - 15.9 g/dL   Hematocrit 42.9 34.0 - 46.6 %   MCV 95 79 - 97 fL   MCH 31.3 26.6 - 33.0 pg   MCHC 33.1 31.5 - 35.7 g/dL   RDW 13.0 12.3 - 15.4 %    Comment: **Effective May 07, 2018, the RDW pediatric reference**   interval will be removed and the adult reference interval   will be changing to:                             Female 11.7 - 15.4                                                      Female 11.6 - 15.4    Platelets 355 150 - 450 x10E3/uL   Neutrophils 59 Not Estab. %   Lymphs 29 Not Estab. %   Monocytes 10 Not Estab. %   Eos 2 Not Estab. %   Basos 0 Not Estab. %   Neutrophils Absolute 4.7 1.4 - 7.0 x10E3/uL   Lymphocytes Absolute 2.4 0.7 - 3.1 x10E3/uL   Monocytes Absolute 0.8 0.1 - 0.9 x10E3/uL   EOS (ABSOLUTE) 0.1 0.0 -  0.4 x10E3/uL   Basophils Absolute 0.0 0.0 - 0.2 x10E3/uL   Immature Granulocytes 0 Not Estab. %   Immature Grans (Abs) 0.0 0.0 - 0.1 x10E3/uL  Comprehensive metabolic panel     Status: Abnormal   Collection Time: 04/10/18  9:43 AM  Result Value Ref Range   Glucose 107 (H) 65 - 99 mg/dL   BUN 17 6 - 24 mg/dL   Creatinine, Ser 1.04 (H) 0.57 - 1.00 mg/dL   GFR calc non Af Amer 60 >59 mL/min/1.73   GFR calc Af Amer 69 >59 mL/min/1.73   BUN/Creatinine Ratio 16 9 - 23   Sodium 139 134 - 144 mmol/L   Potassium 4.1 3.5 - 5.2 mmol/L   Chloride 97 96 - 106 mmol/L    CO2 25 20 - 29 mmol/L   Calcium 10.2 8.7 - 10.2 mg/dL   Total Protein 7.0 6.0 - 8.5 g/dL   Albumin 4.4 3.5 - 5.5 g/dL   Globulin, Total 2.6 1.5 - 4.5 g/dL   Albumin/Globulin Ratio 1.7 1.2 - 2.2   Bilirubin Total 0.4 0.0 - 1.2 mg/dL   Alkaline Phosphatase 120 (H) 39 - 117 IU/L   AST 16 0 - 40 IU/L   ALT 14 0 - 32 IU/L  Lipid Panel With LDL/HDL Ratio     Status: Abnormal   Collection Time: 04/10/18  9:43 AM  Result Value Ref Range   Cholesterol, Total 214 (H) 100 - 199 mg/dL   Triglycerides 83 0 - 149 mg/dL   HDL 64 >39 mg/dL   VLDL Cholesterol Cal 17 5 - 40 mg/dL   LDL Calculated 133 (H) 0 - 99 mg/dL   LDl/HDL Ratio 2.1 0.0 - 3.2 ratio    Comment:                                     LDL/HDL Ratio                                             Men  Women                               1/2 Avg.Risk  1.0    1.5                                   Avg.Risk  3.6    3.2                                2X Avg.Risk  6.2    5.0                                3X Avg.Risk  8.0    6.1   T4, free     Status: None   Collection Time: 04/10/18  9:43 AM  Result Value Ref Range   Free T4 1.29 0.82 - 1.77 ng/dL  TSH     Status: None   Collection Time: 04/10/18  9:43 AM  Result Value Ref Range   TSH 1.300 0.450 - 4.500  uIU/mL  FSH/LH     Status: None   Collection Time: 04/10/18  9:43 AM  Result Value Ref Range   LH 41.1 mIU/mL    Comment:                     Adult Female:                       Follicular phase      2.4 -  12.6                       Ovulation phase      14.0 -  95.6                       Luteal phase          1.0 -  11.4                       Postmenopausal        7.7 -  58.5    FSH 64.4 mIU/mL    Comment:                     Adult Female:                       Follicular phase      3.5 -  12.5                       Ovulation phase       4.7 -  21.5                       Luteal phase          1.7 -   7.7                       Postmenopausal       25.8 - 134.8   Specimen status  report     Status: None   Collection Time: 04/10/18  9:43 AM  Result Value Ref Range   specimen status report Comment     Comment: Written Authorization Written Authorization Written Authorization Received. Authorization received from [employer] [facility] [employer] 05-01-2018 Logged by Horace Porteous   POCT INR     Status: Abnormal   Collection Time: 04/13/18  9:25 AM  Result Value Ref Range   INR 1.1 (A) 2.0 - 3.0    Comment: PT 13.5  POCT INR     Status: Abnormal   Collection Time: 05/28/18  9:57 AM  Result Value Ref Range   INR 4.0 (A) 2.0 - 3.0    Comment: pt inr 4.0 /47.9   POCT HgB A1C     Status: Abnormal   Collection Time: 05/28/18 10:00 AM  Result Value Ref Range   Hemoglobin A1C 7.2 (A) 4.0 - 5.6 %   HbA1c POC (<> result, manual entry)     HbA1c, POC (prediabetic range)     HbA1c, POC (controlled diabetic range)      Radiology: No results found.  No results found.  No results found.    Assessment and Plan: Patient Active Problem List   Diagnosis Date Noted  . Long term (current) use of anticoagulants 01/17/2018  . Appendicitis with perforation   . Uncontrolled type 2 diabetes mellitus with  hyperglycemia ([place]) 11/09/2017  . 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 Mucopurulent chronic bronchitis ([place]) 11/09/2017  . Personal history of venous thrombosis and embolism 10/06/2017  . Phlebitis and thrombophlebitis of other sites 06/09/2017  . Obstructive sleep apnea of adult 06/09/2017  . Gout, unspecified 06/09/2017  . Herpesviral vulvovaginitis 06/09/2017  . Type 2 diabetes mellitus with diabetic neuropathy, unspecified ([place]) 06/09/2017  . Essential (primary) hypertension 06/09/2017  . Chronic anticoagulation 06/09/2017  . Pain medication agreement signed 01/05/2017  . Subacromial bursitis of left shoulder joint 10/19/2016  . Chronic shoulder bursitis, right 06/21/2016  . Chronic pain of left knee 07/14/2014  . Myofascial muscle pain 07/14/2014  . Low back pain 03/18/2014  . Chronic  obstructive pulmonary disease ([place]) 05/01/2013  . Upper respiratory infection 05/01/2013  . Acne 09/12/2012  . Hand dermatitis 09/12/2012  . DVT (deep venous thrombosis) ([place]) 08/31/2012  . Constipation 08/16/2012  . Esophageal reflux disease 08/16/2012  . Asthma 04/10/2012  . Tobacco use disorder 04/02/2012  . Morbid obesity ([place]) 03/01/2012  . Dermatitis 01/11/2012  . Depressive disorder 06/22/2010  . Diabetes mellitus without complication ([place]) 24/40/1027  . Encounter for long-term (current) use of other medications 08/17/2009    1. Respiratory infection Patient provided with prescription for azithromycin.  I am hopeful that the antibacterial as well as antiviral properties and azithromycin will help her recover from this like infection.  Patient started to start prednisone taper tomorrow as she received injection for Depo-Medrol in office today. - predniSONE (DELTASONE) 10 MG tablet; Use per dose pack  Dispense: 21 tablet; Refill: 0 - azithromycin (ZITHROMAX) 250 MG tablet; Take as directed  Dispense: 6 tablet; Refill: 0  2. Wheezing Venogram Depo-Medrol injection supplied to patient in office today.  Instructed patient, continue taking her inhalers nebulizers as prescribed.  Return to clinic or go to the [facility] room if wheezing or shortness of breath or uncontrolled by home medications. - methylPREDNISolone acetate (DEPO-MEDROL) injection 80 mg  3. Essential (primary) hypertension Patient's blood pressure stable at this time.  However it is noted that she is tachycardic at 105 bpm.  Likely due to coughing and infection.  We will continue under future visits.  4. Morbid obesity ([place]) Obesity Counseling: Risk Assessment: An assessment of behavioral risk factors was made today and includes lack of exercise sedentary lifestyle, lack of portion control and poor dietary habits.  Risk Modification Advice: She was counseled on portion control guidelines. Restricting daily caloric intake to.  . The detrimental long term effects of obesity on her health and ongoing poor compliance was also discussed with the patient.    General Counseling: I have discussed the findings of the evaluation and examination with Judeen Hammans.  I have also discussed any further diagnostic evaluation thatmay be needed or ordered today. Zaharah verbalizes understanding of the findings of todays visit. We also reviewed her medications today and discussed drug interactions and side effects including but not limited excessive drowsiness and altered mental states. We also discussed that there is always a risk not just to her but also people around her. she has been encouraged to call the office with any questions or concerns that should arise related to todays visit.    Time spent: 25 This patient was seen by Orson Gear AGNP-C in Collaboration with Dr. Devona Konig as a part of collaborative care agreement.   I have personally obtained a history, examined the patient, evaluated laboratory and imaging results, formulated the assessment and plan and placed orders.  Allyne Gee, MD [facility] Pulmonary and Critical Care Sleep medicine

## 2018-07-05 ENCOUNTER — Other Ambulatory Visit: Payer: Self-pay | Admitting: Adult Health

## 2018-07-08 ENCOUNTER — Other Ambulatory Visit: Payer: Self-pay | Admitting: Adult Health

## 2018-07-10 ENCOUNTER — Other Ambulatory Visit: Payer: Self-pay

## 2018-07-10 ENCOUNTER — Ambulatory Visit
Admission: RE | Admit: 2018-07-10 | Discharge: 2018-07-10 | Disposition: A | Discharge status: Home / Self Care | Payer: Medicare Other | Source: Ambulatory Visit | Attending: Adult Health | Admitting: Adult Health

## 2018-07-10 ENCOUNTER — Ambulatory Visit (INDEPENDENT_AMBULATORY_CARE_PROVIDER_SITE_OTHER): Payer: Medicare Other | Admitting: Adult Health

## 2018-07-10 VITALS — BP 118/80 | HR 86 | Temp 97.7°F | Resp 16 | Ht 63.0 in | Wt 247.0 lb

## 2018-07-10 DIAGNOSIS — I10 Essential (primary) hypertension: Secondary | ICD-10-CM | POA: Diagnosis not present

## 2018-07-10 DIAGNOSIS — Z7901 Long term (current) use of anticoagulants: Secondary | ICD-10-CM | POA: Diagnosis not present

## 2018-07-10 DIAGNOSIS — M79602 Pain in left arm: Secondary | ICD-10-CM | POA: Diagnosis present

## 2018-07-10 LAB — POCT INR: INR: 1 — AB (ref 2.0–3.0)

## 2018-07-10 MED ORDER — TRAMADOL HCL 50 MG PO TABS: 50.0000 mg | ORAL_TABLET | Freq: Three times a day (TID) | ORAL | 0 refills | Status: DC | PRN

## 2018-07-10 NOTE — Progress Notes (Signed)
[facility] [place], [place] [address]  Internal MEDICINE  Office Visit Note  Patient Name: Brenda Santana  [id number]  [id number]  Date of Service: 07/11/2018  Chief Complaint  Patient presents with  . Arm Pain    left lower arm pain , feels like something pulling in the arm , feels like blood clot      HPI Pt is here for a sick visit. Pt reports 3-4 days of increasingly worse left arm pain.  She reports a history of blood clot in her right arm and this feels similar.  She denies any redness, warmth or swelling.  She reports pain with palpation, and movement.  She denies lifting or moving anything heavy.  She denies any trauma or other issues.  Her INR in office is 1.0.       Current Medication:  Outpatient Encounter Medications as of 07/10/2018  Medication Sig Note  . acyclovir (ZOVIRAX) 400 MG tablet Take 1 tablet (400 mg total) by mouth 2 (two) times daily.   Marland Kitchen acyclovir ointment (ZOVIRAX) 5 % APPLY TOPICALLY EVERY 3 (THREE) HOURS.   Marland Kitchen albuterol (PROVENTIL HFA) 108 (90 Base) MCG/ACT inhaler Inhale 2 puffs into the lungs every 6 (six) hours as needed for wheezing or shortness of breath.   . allopurinol (ZYLOPRIM) 300 MG tablet TAKE ONE TAB AT NIGHT FOR GOUT   . aspirin EC 81 MG tablet Take 81 mg by mouth daily.   Marland Kitchen azithromycin (ZITHROMAX) 250 MG tablet Take as directed   . BD PEN NEEDLE NANO U/F 32G X 4 MM MISC USE DAILY WITH VICTOZA   . Biotin 5 MG CAPS Take 5 mg by mouth daily.    . cetirizine (ZYRTEC ALLERGY) 10 MG tablet Take 10 mg by mouth daily as needed for allergies.    . chlorthalidone (HYGROTON) 25 MG tablet TAKE 1 TABLET BY MOUTH EVERY DAY IN THE MORNING   . clobetasol cream (TEMOVATE) 1.19 % Apply 1 application topically 2 (two) times daily as needed (for eczema on hands).    . diclofenac sodium (VOLTAREN) 1 % GEL APPLY 4 G TOPICALLY FOUR (4) TIMES A DAY.   Marland Kitchen docusate sodium (COLACE) 100 MG capsule Take 100 mg by mouth daily.    Marland Kitchen  doxycycline (VIBRA-TABS) 100 MG tablet Take 100 mg by mouth 2 (two) times daily.  01/31/2018: Continuous  . famotidine (PEPCID) 20 MG tablet Take 20 mg by mouth daily as needed for heartburn or indigestion.    . Ferrous Sulfate (IRON) 325 (65 Fe) MG TABS Take 325 mg by mouth 3 (three) times daily.  01/31/2018: Stopped for procedure  . Fluticasone-Salmeterol (ADVAIR DISKUS) 500-50 MCG/DOSE AEPB Inhale 1 puff into the lungs 2 (two) times daily as needed (for shortness of breath or wheezing).    . gabapentin (NEURONTIN) 800 MG tablet Take 800 mg by mouth 3 (three) times daily as needed (for pain).    . INVOKAMET 50-500 MG TABS TAKE 1 TABLET BY MOUTH TWICE A DAY   . Ipratropium-Albuterol (COMBIVENT RESPIMAT) 20-100 MCG/ACT AERS respimat Inhale 1 puff into the lungs every 6 (six) hours.   Marland Kitchen ipratropium-albuterol (DUONEB) 0.5-2.5 (3) MG/3ML SOLN Take 3 mLs by nebulization every 6 (six) hours as needed (for shortness of breath or wheezing).    Marland Kitchen LINZESS 145 MCG CAPS capsule TAKE 1 CAPSULE (145 MCG TOTAL) BY MOUTH DAILY BEFORE BREAKFAST.   Marland Kitchen lisinopril (PRINIVIL,ZESTRIL) 10 MG tablet TAKE 1 TAB DAILY IN MORNING (Patient taking  differently: Take 10 mg by mouth daily. )   . metoprolol tartrate (LOPRESSOR) 50 MG tablet TAKE 1 TABLET BY MOUTH TWICE A DAY (Patient taking differently: Take 50 mg by mouth 2 (two) times daily. )   . montelukast (SINGULAIR) 10 MG tablet TAKE 1 TABLET BY MOUTH DAILY FOR ASTHMA (Patient taking differently: Take 10 mg by mouth daily. )   . oxymetazoline (AFRIN) 0.05 % nasal spray Place 1 spray into both nostrils 2 (two) times daily as needed for congestion.   . phentermine (ADIPEX-P) 37.5 MG tablet Take 1 tablet (37.5 mg total) by mouth daily before breakfast.   . predniSONE (DELTASONE) 10 MG tablet Use per dose pack   . predniSONE (DELTASONE) 5 MG tablet TAKE 2 TABLETS BY MOUTH EVERY DAY   . SYMBICORT 160-4.5 MCG/ACT inhaler INHALE 2 PUFF TWICE A DAY   . traMADol (ULTRAM) 50 MG tablet  Take 1 tablet (50 mg total) by mouth every 8 (eight) hours as needed for moderate pain.   Marland Kitchen tretinoin (RETIN-A) 0.025 % cream Apply 1 application topically at bedtime as needed (for eczema on face).    Marland Kitchen umeclidinium bromide (INCRUSE ELLIPTA) 62.5 MCG/INH AEPB Inhale 1 puff into the lungs daily.   Marland Kitchen VICTOZA 18 MG/3ML SOPN TAKE 1.8 MG EVERY MORNING (Patient taking differently: Inject 1.8 mg into the skin daily. )   . warfarin (COUMADIN) 5 MG tablet TAKE 1 AND 1/2 TABS (7.5) DAILY OR AS DIRECTED   . [DISCONTINUED] traMADol (ULTRAM) 50 MG tablet Take 50 mg by mouth every 8 (eight) hours as needed for moderate pain.     No facility-administered encounter medications on file as of 07/10/2018.       Medical History: Past Medical History:  Diagnosis Date  . Asthma   . Atopic dermatitis   . Constipation   . COPD (chronic obstructive pulmonary disease) ([place])   . Diabetes mellitus without complication ([place])   . DVT (deep venous thrombosis) ([place])   . GERD (gastroesophageal reflux disease)   . Hyperlipidemia   . Hypertension   . Rheumatoid arthritis ([facility])      Vital Signs: BP 118/80 (BP Location: Left Arm, Patient Position: Sitting, Cuff Size: Normal)   Pulse 86   Temp 97.7 F (36.5 C) (Oral)   Resp 16   Ht 5\' 3"  (1.6 m)   Wt 247 lb (112 kg)   SpO2 97%   BMI 43.75 kg/m    Review of Systems  Constitutional: Negative for chills, fatigue and unexpected weight change.  HENT: Negative for congestion, rhinorrhea, sneezing and sore throat.   Eyes: Negative for photophobia, pain and redness.  Respiratory: Negative for cough, chest tightness and shortness of breath.   Cardiovascular: Negative for chest pain and palpitations.  Gastrointestinal: Negative for abdominal pain, constipation, diarrhea, nausea and vomiting.  Endocrine: Negative.   Genitourinary: Negative for dysuria and frequency.  Musculoskeletal: Negative for arthralgias, back pain, joint swelling and neck pain.       Left arm  Pulling/squeezing pain.   Skin: Negative for rash.  Allergic/Immunologic: Negative.   Neurological: Negative for tremors and numbness.  Hematological: Negative for adenopathy. Does not bruise/bleed easily.  Psychiatric/Behavioral: Negative for behavioral problems and sleep disturbance. The patient is not nervous/anxious.     Physical Exam Vitals signs and nursing note reviewed.  Constitutional:      General: She is not in acute distress.    Appearance: She is well-developed. She is not diaphoretic.  HENT:  Head: Normocephalic and atraumatic.     Mouth/Throat:     Pharynx: No oropharyngeal exudate.  Eyes:     Pupils: Pupils are equal, round, and reactive to light.  Neck:     Musculoskeletal: Normal range of motion and neck supple.     Thyroid: No thyromegaly.     Vascular: No JVD.     Trachea: No tracheal deviation.  Cardiovascular:     Rate and Rhythm: Normal rate and regular rhythm.     Heart sounds: Normal heart sounds. No murmur. No friction rub. No gallop.   Pulmonary:     Effort: Pulmonary effort is normal. No respiratory distress.     Breath sounds: Normal breath sounds. No wheezing or rales.  Chest:     Chest wall: No tenderness.  Abdominal:     Palpations: Abdomen is soft.     Tenderness: There is no abdominal tenderness. There is no guarding.  Musculoskeletal: Normal range of motion.     Comments: Left arm pain, no swelling, good pulses. Color normal.  Lymphadenopathy:     Cervical: No cervical adenopathy.  Skin:    General: Skin is warm and dry.  Neurological:     Mental Status: She is alert and oriented to person, place, and time.     Cranial Nerves: No cranial nerve deficit.  Psychiatric:        Behavior: Behavior normal.        Thought Content: Thought content normal.        Judgment: Judgment normal.    Assessment/Plan: 1. Left arm pain Patient had stat left upper extremity venous ultrasound.  It showed no evidence of blood clot.  Patient is  tearful in the office post ultrasound and tramadol prescription is given to help patient with acute pain at the time.  Patient has good pulses and cap refill in her affected extremity. - US Venous Img Upper Uni Left; Future - traMADol (ULTRAM) 50 MG tablet; Take 1 tablet (50 mg total) by mouth every 8 (eight) hours as needed for moderate pain.  Dispense: 20 tablet; Refill: 0  2. Anticoagulant long-term use INR today 1.0.  Patient will take for milligrams of Coumadin today.  And continue with her 25 mg a day beyond today.  She will continue to take 5 mg every Tuesday.  Recheck INR in 2 weeks. - POCT INR  3. Essential (primary) hypertension Stable, continue current medication a try.  General Counseling: sydnee lamour understanding of the findings of todays visit and agrees with plan of treatment. I have discussed any further diagnostic evaluation that may be needed or ordered today. We also reviewed her medications today. she has been encouraged to call the office with any questions or concerns that should arise related to todays visit.   Orders Placed This Encounter  Procedures  . US Venous Img Upper Uni Left  . POCT INR    Meds ordered this encounter  Medications  . traMADol (ULTRAM) 50 MG tablet    Sig: Take 1 tablet (50 mg total) by mouth every 8 (eight) hours as needed for moderate pain.    Dispense:  20 tablet    Refill:  0    Time spent: 25 Minutes  This patient was seen by Orson Gear AGNP-C in Collaboration with Dr Lavera Guise as a part of collaborative care agreement.  Kendell Bane AGNP-C Internal Medicine

## 2018-07-11 ENCOUNTER — Encounter: Payer: Self-pay | Admitting: Adult Health

## 2018-07-17 ENCOUNTER — Encounter: Payer: Self-pay | Admitting: Adult Health

## 2018-07-17 ENCOUNTER — Ambulatory Visit (INDEPENDENT_AMBULATORY_CARE_PROVIDER_SITE_OTHER): Payer: Medicare Other | Admitting: Adult Health

## 2018-07-17 VITALS — BP 112/82 | HR 86 | Resp 16 | Ht 63.0 in | Wt 246.0 lb

## 2018-07-17 DIAGNOSIS — M542 Cervicalgia: Secondary | ICD-10-CM | POA: Diagnosis not present

## 2018-07-17 DIAGNOSIS — M79602 Pain in left arm: Secondary | ICD-10-CM

## 2018-07-17 DIAGNOSIS — Z7901 Long term (current) use of anticoagulants: Secondary | ICD-10-CM | POA: Diagnosis not present

## 2018-07-17 LAB — POCT INR: INR: 1 — AB (ref 2.0–3.0)

## 2018-07-17 MED ORDER — WARFARIN SODIUM 5 MG PO TABS: ORAL_TABLET | ORAL | 3 refills | Status: DC

## 2018-07-17 MED ORDER — PREDNISONE 10 MG PO TABS: ORAL_TABLET | ORAL | 0 refills | Status: DC

## 2018-07-17 NOTE — Progress Notes (Signed)
[facility] [place], [place] [address]  Internal MEDICINE  Office Visit Note  Patient Name: Brenda Santana  [id number]  [id number]  Date of Service: 07/17/2018  Chief Complaint  Patient presents with  . Arm Pain    still hurting     HPI  Pt is here for follow up on INR as well as her arm pain.  She continues to reports left arm pain.  She mostly complains of pain from wrist to elbow. However, she also reports that the pain radiates from her neck and down her arm completely.  She is here today requesting a referral.  Her INR is 1.0 today.  We will increase her coumadin.  She reports she has taken her normal dose without fail.    Current Medication: Outpatient Encounter Medications as of 07/17/2018  Medication Sig Note  . acyclovir (ZOVIRAX) 400 MG tablet Take 1 tablet (400 mg total) by mouth 2 (two) times daily.   Marland Kitchen acyclovir ointment (ZOVIRAX) 5 % APPLY TOPICALLY EVERY 3 (THREE) HOURS.   Marland Kitchen albuterol (PROVENTIL HFA) 108 (90 Base) MCG/ACT inhaler Inhale 2 puffs into the lungs every 6 (six) hours as needed for wheezing or shortness of breath.   . allopurinol (ZYLOPRIM) 300 MG tablet TAKE ONE TAB AT NIGHT FOR GOUT   . aspirin EC 81 MG tablet Take 81 mg by mouth daily.   Marland Kitchen azithromycin (ZITHROMAX) 250 MG tablet Take as directed   . BD PEN NEEDLE NANO U/F 32G X 4 MM MISC USE DAILY WITH VICTOZA   . Biotin 5 MG CAPS Take 5 mg by mouth daily.    . cetirizine (ZYRTEC ALLERGY) 10 MG tablet Take 10 mg by mouth daily as needed for allergies.    . chlorthalidone (HYGROTON) 25 MG tablet TAKE 1 TABLET BY MOUTH EVERY DAY IN THE MORNING   . clobetasol cream (TEMOVATE) 0.96 % Apply 1 application topically 2 (two) times daily as needed (for eczema on hands).    . diclofenac sodium (VOLTAREN) 1 % GEL APPLY 4 G TOPICALLY FOUR (4) TIMES A DAY.   Marland Kitchen docusate sodium (COLACE) 100 MG capsule Take 100 mg by mouth daily.    Marland Kitchen doxycycline (VIBRA-TABS) 100 MG tablet Take 100 mg by mouth  2 (two) times daily.  01/31/2018: Continuous  . famotidine (PEPCID) 20 MG tablet Take 20 mg by mouth daily as needed for heartburn or indigestion.    . Ferrous Sulfate (IRON) 325 (65 Fe) MG TABS Take 325 mg by mouth 3 (three) times daily.  01/31/2018: Stopped for procedure  . Fluticasone-Salmeterol (ADVAIR DISKUS) 500-50 MCG/DOSE AEPB Inhale 1 puff into the lungs 2 (two) times daily as needed (for shortness of breath or wheezing).    . gabapentin (NEURONTIN) 800 MG tablet Take 800 mg by mouth 3 (three) times daily as needed (for pain).    . INVOKAMET 50-500 MG TABS TAKE 1 TABLET BY MOUTH TWICE A DAY   . Ipratropium-Albuterol (COMBIVENT RESPIMAT) 20-100 MCG/ACT AERS respimat Inhale 1 puff into the lungs every 6 (six) hours.   Marland Kitchen ipratropium-albuterol (DUONEB) 0.5-2.5 (3) MG/3ML SOLN Take 3 mLs by nebulization every 6 (six) hours as needed (for shortness of breath or wheezing).    Marland Kitchen LINZESS 145 MCG CAPS capsule TAKE 1 CAPSULE (145 MCG TOTAL) BY MOUTH DAILY BEFORE BREAKFAST.   Marland Kitchen lisinopril (PRINIVIL,ZESTRIL) 10 MG tablet TAKE 1 TAB DAILY IN MORNING (Patient taking differently: Take 10 mg by mouth daily. )   . metoprolol  tartrate (LOPRESSOR) 50 MG tablet TAKE 1 TABLET BY MOUTH TWICE A DAY (Patient taking differently: Take 50 mg by mouth 2 (two) times daily. )   . montelukast (SINGULAIR) 10 MG tablet TAKE 1 TABLET BY MOUTH DAILY FOR ASTHMA (Patient taking differently: Take 10 mg by mouth daily. )   . oxymetazoline (AFRIN) 0.05 % nasal spray Place 1 spray into both nostrils 2 (two) times daily as needed for congestion.   . phentermine (ADIPEX-P) 37.5 MG tablet Take 1 tablet (37.5 mg total) by mouth daily before breakfast.   . predniSONE (DELTASONE) 10 MG tablet Use per dose pack   . predniSONE (DELTASONE) 5 MG tablet TAKE 2 TABLETS BY MOUTH EVERY DAY   . SYMBICORT 160-4.5 MCG/ACT inhaler INHALE 2 PUFF TWICE A DAY   . traMADol (ULTRAM) 50 MG tablet Take 1 tablet (50 mg total) by mouth every 8 (eight) hours as  needed for moderate pain.   Marland Kitchen tretinoin (RETIN-A) 0.025 % cream Apply 1 application topically at bedtime as needed (for eczema on face).    Marland Kitchen umeclidinium bromide (INCRUSE ELLIPTA) 62.5 MCG/INH AEPB Inhale 1 puff into the lungs daily.   Marland Kitchen VICTOZA 18 MG/3ML SOPN TAKE 1.8 MG EVERY MORNING (Patient taking differently: Inject 1.8 mg into the skin daily. )   . warfarin (COUMADIN) 5 MG tablet TAKE 1 AND 1/2 TABS (7.5) DAILY OR AS DIRECTED    No facility-administered encounter medications on file as of 07/17/2018.     Surgical History: Past Surgical History:  Procedure Laterality Date  . APPENDECTOMY    . COLONOSCOPY WITH PROPOFOL N/A 02/02/2018   Procedure: COLONOSCOPY WITH PROPOFOL;  Surgeon: Jonathon Bellows, MD;  Location: [facility] ENDOSCOPY;  Service: Gastroenterology;  Laterality: N/A;  . LAPAROSCOPIC APPENDECTOMY N/A 02/05/2018   Procedure: APPENDECTOMY LAPAROSCOPIC;  Surgeon: Jules Husbands, MD;  Location: [facility] ORS;  Service: General;  Laterality: N/A;  . right arm surgery    . TUBAL LIGATION      Medical History: Past Medical History:  Diagnosis Date  . Asthma   . Atopic dermatitis   . Constipation   . COPD (chronic obstructive pulmonary disease) ([place])   . Diabetes mellitus without complication ([place])   . DVT (deep venous thrombosis) ([place])   . GERD (gastroesophageal reflux disease)   . Hyperlipidemia   . Hypertension   . Rheumatoid arthritis ([place])     Family History: Family History  Problem Relation Age of Onset  . Breast cancer Mother   . Hypertension Mother   . Diabetes Mother   . Hypertension Father   . Heart failure Father     Social History   Socioeconomic History  . Marital status: Single    Spouse name: Not on file  . Number of children: Not on file  . Years of education: Not on file  . Highest education level: Not on file  Occupational History  . Not on file  Social Needs  . Financial resource strain: Not on file  . Food insecurity:    Worry: Not on file     Inability: Not on file  . Transportation needs:    Medical: Not on file    Non-medical: Not on file  Tobacco Use  . Smoking status: Former Research scientist (life sciences)  . Smokeless tobacco: Never Used  Substance and Sexual Activity  . Alcohol use: No  . Drug use: No  . Sexual activity: Yes  Lifestyle  . Physical activity:    Days per week: Not on file  Minutes per session: Not on file  . Stress: Not on file  Relationships  . Social connections:    Talks on phone: Not on file    Gets together: Not on file    Attends religious service: Not on file    Active member of club or organization: Not on file    Attends meetings of clubs or organizations: Not on file    Relationship status: Not on file  . Intimate partner violence:    Fear of current or ex partner: Not on file    Emotionally abused: Not on file    Physically abused: Not on file    Forced sexual activity: Not on file  Other Topics Concern  . Not on file  Social History Narrative  . Not on file      Review of Systems  Constitutional: Negative for chills, fatigue and unexpected weight change.  HENT: Negative for congestion, rhinorrhea, sneezing and sore throat.   Eyes: Negative for photophobia, pain and redness.  Respiratory: Negative for cough, chest tightness and shortness of breath.   Cardiovascular: Negative for chest pain and palpitations.  Gastrointestinal: Negative for abdominal pain, constipation, diarrhea, nausea and vomiting.  Endocrine: Negative.   Genitourinary: Negative for dysuria and frequency.  Musculoskeletal: Negative for arthralgias, back pain, joint swelling and neck pain.  Skin: Negative for rash.  Allergic/Immunologic: Negative.   Neurological: Negative for tremors and numbness.  Hematological: Negative for adenopathy. Does not bruise/bleed easily.  Psychiatric/Behavioral: Negative for behavioral problems and sleep disturbance. The patient is not nervous/anxious.     Vital Signs: BP 112/82   Pulse 86    Resp 16   Ht 5\' 3"  (1.6 m)   Wt 246 lb (111.6 kg)   SpO2 98%   BMI 43.58 kg/m    Physical Exam Vitals signs and nursing note reviewed.  Constitutional:      General: She is not in acute distress.    Appearance: She is well-developed. She is not diaphoretic.  HENT:     Head: Normocephalic and atraumatic.     Mouth/Throat:     Pharynx: No oropharyngeal exudate.  Eyes:     Pupils: Pupils are equal, round, and reactive to light.  Neck:     Musculoskeletal: Normal range of motion and neck supple.     Thyroid: No thyromegaly.     Vascular: No JVD.     Trachea: No tracheal deviation.  Cardiovascular:     Rate and Rhythm: Normal rate and regular rhythm.     Heart sounds: Normal heart sounds. No murmur. No friction rub. No gallop.   Pulmonary:     Effort: Pulmonary effort is normal. No respiratory distress.     Breath sounds: Normal breath sounds. No wheezing or rales.  Chest:     Chest wall: No tenderness.  Abdominal:     Palpations: Abdomen is soft.     Tenderness: There is no abdominal tenderness. There is no guarding.  Musculoskeletal: Normal range of motion.  Lymphadenopathy:     Cervical: No cervical adenopathy.  Skin:    General: Skin is warm and dry.  Neurological:     Mental Status: She is alert and oriented to person, place, and time.     Cranial Nerves: No cranial nerve deficit.  Psychiatric:        Behavior: Behavior normal.        Thought Content: Thought content normal.        Judgment: Judgment normal.  Assessment/Plan: 1. Anticoagulant long-term use Take an extra tablet today.  Increase coumadin to 5mg  on Tuesday and friday. Continue 2.5mg  on Monday, Wednesday, Thursday, Saturday, and Sunday.   - POCT INR - warfarin (COUMADIN) 5 MG tablet; Take a whole tablet on Tue and Friday.  Take a 1/2 tablet on Mon, Wednesday, Thursday, Saturday and sunday  Dispense: 135 tablet; Refill: 3  2. Neck pain Pt will be given prednisone for inflammation and pain in neck  and left arm.  Referral to ortho per her request.  - Ambulatory referral to Orthopedic Surgery - predniSONE (DELTASONE) 10 MG tablet; Use per dose pack  Dispense: 21 tablet; Refill: 0  3. Arm pain, diffuse, left - Ambulatory referral to Orthopedic Surgery  General Counseling: maya arcand understanding of the findings of todays visit and agrees with plan of treatment. I have discussed any further diagnostic evaluation that may be needed or ordered today. We also reviewed her medications today. she has been encouraged to call the office with any questions or concerns that should arise related to todays visit.    Orders Placed This Encounter  Procedures  . POCT INR    No orders of the defined types were placed in this encounter.   Time spent: 25 Minutes   This patient was seen by Orson Gear AGNP-C in Collaboration with Dr Lavera Guise as a part of collaborative care agreement     Kendell Bane AGNP-C Internal medicine

## 2018-07-22 ENCOUNTER — Other Ambulatory Visit: Payer: Self-pay | Admitting: Internal Medicine

## 2018-07-24 ENCOUNTER — Ambulatory Visit: Payer: Medicare Other

## 2018-08-01 ENCOUNTER — Other Ambulatory Visit: Payer: Self-pay

## 2018-08-01 MED ORDER — ALBUTEROL SULFATE HFA 108 (90 BASE) MCG/ACT IN AERS: 2.0000 | INHALATION_SPRAY | Freq: Four times a day (QID) | RESPIRATORY_TRACT | 2 refills | Status: DC | PRN

## 2018-08-06 ENCOUNTER — Other Ambulatory Visit: Payer: Self-pay

## 2018-08-06 ENCOUNTER — Other Ambulatory Visit: Payer: Self-pay | Admitting: Internal Medicine

## 2018-08-06 MED ORDER — ACYCLOVIR 400 MG PO TABS: 400.0000 mg | ORAL_TABLET | Freq: Two times a day (BID) | ORAL | 1 refills | Status: DC

## 2018-08-09 ENCOUNTER — Other Ambulatory Visit: Payer: Self-pay | Admitting: Adult Health

## 2018-08-09 DIAGNOSIS — M79602 Pain in left arm: Secondary | ICD-10-CM

## 2018-08-09 NOTE — Telephone Encounter (Signed)
Last visit 07/17/18 and next 08/27/18 last tramadol refills on 3/10 with no refills

## 2018-08-13 ENCOUNTER — Other Ambulatory Visit: Payer: Self-pay

## 2018-08-13 MED ORDER — MONTELUKAST SODIUM 10 MG PO TABS: ORAL_TABLET | ORAL | 0 refills | Status: DC

## 2018-08-27 ENCOUNTER — Encounter: Payer: Self-pay | Admitting: Adult Health

## 2018-08-27 ENCOUNTER — Ambulatory Visit (INDEPENDENT_AMBULATORY_CARE_PROVIDER_SITE_OTHER): Payer: Medicare Other | Admitting: Adult Health

## 2018-08-27 ENCOUNTER — Other Ambulatory Visit: Payer: Self-pay

## 2018-08-27 DIAGNOSIS — M542 Cervicalgia: Secondary | ICD-10-CM

## 2018-08-27 DIAGNOSIS — B009 Herpesviral infection, unspecified: Secondary | ICD-10-CM

## 2018-08-27 DIAGNOSIS — Z7901 Long term (current) use of anticoagulants: Secondary | ICD-10-CM

## 2018-08-27 DIAGNOSIS — I10 Essential (primary) hypertension: Secondary | ICD-10-CM | POA: Diagnosis not present

## 2018-08-27 DIAGNOSIS — E1165 Type 2 diabetes mellitus with hyperglycemia: Secondary | ICD-10-CM

## 2018-08-27 MED ORDER — PREDNISONE 5 MG PO TABS: 10.0000 mg | ORAL_TABLET | Freq: Every day | ORAL | 1 refills | Status: DC

## 2018-08-27 MED ORDER — ACYCLOVIR 400 MG PO TABS: 400.0000 mg | ORAL_TABLET | Freq: Two times a day (BID) | ORAL | 1 refills | Status: DC

## 2018-08-27 NOTE — Patient Instructions (Signed)
Diabetes Mellitus and Nutrition, Adult  When you have diabetes (diabetes mellitus), it is very important to have healthy eating habits because your blood sugar (glucose) levels are greatly affected by what you eat and drink. Eating healthy foods in the appropriate amounts, at about the same times every day, can help you:  · Control your blood glucose.  · Lower your risk of heart disease.  · Improve your blood pressure.  · Reach or maintain a healthy weight.  Every person with diabetes is different, and each person has different needs for a meal plan. Your health care provider may recommend that you work with a diet and nutrition specialist (dietitian) to make a meal plan that is best for you. Your meal plan may vary depending on factors such as:  · The calories you need.  · The medicines you take.  · Your weight.  · Your blood glucose, blood pressure, and cholesterol levels.  · Your activity level.  · Other health conditions you have, such as heart or kidney disease.  How do carbohydrates affect me?  Carbohydrates, also called carbs, affect your blood glucose level more than any other type of food. Eating carbs naturally raises the amount of glucose in your blood. Carb counting is a method for keeping track of how many carbs you eat. Counting carbs is important to keep your blood glucose at a healthy level, especially if you use insulin or take certain oral diabetes medicines.  It is important to know how many carbs you can safely have in each meal. This is different for every person. Your dietitian can help you calculate how many carbs you should have at each meal and for each snack.  Foods that contain carbs include:  · Bread, cereal, rice, pasta, and crackers.  · Potatoes and corn.  · Peas, beans, and lentils.  · Milk and yogurt.  · Fruit and juice.  · Desserts, such as cakes, cookies, ice cream, and candy.  How does alcohol affect me?  Alcohol can cause a sudden decrease in blood glucose (hypoglycemia),  especially if you use insulin or take certain oral diabetes medicines. Hypoglycemia can be a life-threatening condition. Symptoms of hypoglycemia (sleepiness, dizziness, and confusion) are similar to symptoms of having too much alcohol.  If your health care provider says that alcohol is safe for you, follow these guidelines:  · Limit alcohol intake to no more than 1 drink per day for nonpregnant women and 2 drinks per day for men. One drink equals 12 oz of beer, 5 oz of wine, or 1½ oz of hard liquor.  · Do not drink on an empty stomach.  · Keep yourself hydrated with water, diet soda, or unsweetened iced tea.  · Keep in mind that regular soda, juice, and other mixers may contain a lot of sugar and must be counted as carbs.  What are tips for following this plan?    Reading food labels  · Start by checking the serving size on the "Nutrition Facts" label of packaged foods and drinks. The amount of calories, carbs, fats, and other nutrients listed on the label is based on one serving of the item. Many items contain more than one serving per package.  · Check the total grams (g) of carbs in one serving. You can calculate the number of servings of carbs in one serving by dividing the total carbs by 15. For example, if a food has 30 g of total carbs, it would be equal to 2   servings of carbs.  · Check the number of grams (g) of saturated and trans fats in one serving. Choose foods that have low or no amount of these fats.  · Check the number of milligrams (mg) of salt (sodium) in one serving. Most people should limit total sodium intake to less than 2,300 mg per day.  · Always check the nutrition information of foods labeled as "low-fat" or "nonfat". These foods may be higher in added sugar or refined carbs and should be avoided.  · Talk to your dietitian to identify your daily goals for nutrients listed on the label.  Shopping  · Avoid buying canned, premade, or processed foods. These foods tend to be high in fat, sodium,  and added sugar.  · Shop around the outside edge of the grocery store. This includes fresh fruits and vegetables, bulk grains, fresh meats, and fresh dairy.  Cooking  · Use low-heat cooking methods, such as baking, instead of high-heat cooking methods like deep frying.  · Cook using healthy oils, such as olive, canola, or sunflower oil.  · Avoid cooking with butter, cream, or high-fat meats.  Meal planning  · Eat meals and snacks regularly, preferably at the same times every day. Avoid going long periods of time without eating.  · Eat foods high in fiber, such as fresh fruits, vegetables, beans, and whole grains. Talk to your dietitian about how many servings of carbs you can eat at each meal.  · Eat 4-6 ounces (oz) of lean protein each day, such as lean meat, chicken, fish, eggs, or tofu. One oz of lean protein is equal to:  ? 1 oz of meat, chicken, or fish.  ? 1 egg.  ? ¼ cup of tofu.  · Eat some foods each day that contain healthy fats, such as avocado, nuts, seeds, and fish.  Lifestyle  · Check your blood glucose regularly.  · Exercise regularly as told by your health care provider. This may include:  ? 150 minutes of moderate-intensity or vigorous-intensity exercise each week. This could be brisk walking, biking, or water aerobics.  ? Stretching and doing strength exercises, such as yoga or weightlifting, at least 2 times a week.  · Take medicines as told by your health care provider.  · Do not use any products that contain nicotine or tobacco, such as cigarettes and e-cigarettes. If you need help quitting, ask your health care provider.  · Work with a counselor or diabetes educator to identify strategies to manage stress and any emotional and social challenges.  Questions to ask a health care provider  · Do I need to meet with a diabetes educator?  · Do I need to meet with a dietitian?  · What number can I call if I have questions?  · When are the best times to check my blood glucose?  Where to find more  information:  · American Diabetes Association: diabetes.org  · Academy of Nutrition and Dietetics: www.eatright.org  · National Institute of Diabetes and Digestive and Kidney Diseases (NIH): www.niddk.nih.gov  Summary  · A healthy meal plan will help you control your blood glucose and maintain a healthy lifestyle.  · Working with a diet and nutrition specialist (dietitian) can help you make a meal plan that is best for you.  · Keep in mind that carbohydrates (carbs) and alcohol have immediate effects on your blood glucose levels. It is important to count carbs and to use alcohol carefully.  This information is not intended to   replace advice given to you by your health care provider. Make sure you discuss any questions you have with your health care provider.  Document Released: 01/13/2005 Document Revised: 11/16/2016 Document Reviewed: 05/23/2016  Elsevier Interactive Patient Education © 2019 Elsevier Inc.

## 2018-08-27 NOTE — Progress Notes (Signed)
[facility] [place], [place] [address]  Internal MEDICINE  Telephone Visit  Patient Name: Brenda Santana  [id number]  [id number]  Date of Service: 08/27/2018  I connected with the patient at [place] by telephone and verified the patients identity using two identifiers.  I discussed the limitations, risks, security and privacy concerns of performing an evaluation and management service by telephone and the availability of in person appointments. I also discussed with the patient that there may be a patient responsible charge related to the service.  The patient expressed understanding and agrees to proceed.    Chief Complaint  Patient presents with  . Telephone Assessment  . Telephone Screen  . Diabetes    HPI   Pt reports overall she is doing well.  She denies an recent issues.  She is in need of a Hga1c check, as well as an INR. Her last A1C was 7.2, and her last INR was 1.0.  She will come into the office in the next week to have these checked.  She reports her blood sugars have been okay.  She can not remember any numbers specifically.    Current Medication: Outpatient Encounter Medications as of 08/27/2018  Medication Sig Note  . acyclovir (ZOVIRAX) 400 MG tablet Take 1 tablet (400 mg total) by mouth 2 (two) times daily.   Marland Kitchen acyclovir ointment (ZOVIRAX) 5 % APPLY TOPICALLY EVERY 3 (THREE) HOURS.   Marland Kitchen albuterol (PROVENTIL HFA) 108 (90 Base) MCG/ACT inhaler Inhale 2 puffs into the lungs every 6 (six) hours as needed for wheezing or shortness of breath.   . allopurinol (ZYLOPRIM) 300 MG tablet TAKE ONE TAB AT NIGHT FOR GOUT   . aspirin EC 81 MG tablet Take 81 mg by mouth daily.   Marland Kitchen azithromycin (ZITHROMAX) 250 MG tablet Take as directed   . BD PEN NEEDLE NANO U/F 32G X 4 MM MISC USE DAILY WITH VICTOZA   . Biotin 5 MG CAPS Take 5 mg by mouth daily.    . cetirizine (ZYRTEC ALLERGY) 10 MG tablet Take 10 mg by mouth daily as needed for allergies.    .  chlorthalidone (HYGROTON) 25 MG tablet TAKE 1 TABLET BY MOUTH EVERY DAY IN THE MORNING   . clobetasol cream (TEMOVATE) 5.10 % Apply 1 application topically 2 (two) times daily as needed (for eczema on hands).    . diclofenac sodium (VOLTAREN) 1 % GEL APPLY 4 G TOPICALLY FOUR (4) TIMES A DAY.   Marland Kitchen docusate sodium (COLACE) 100 MG capsule Take 100 mg by mouth daily.    Marland Kitchen doxycycline (VIBRA-TABS) 100 MG tablet Take 100 mg by mouth 2 (two) times daily.  01/31/2018: Continuous  . famotidine (PEPCID) 20 MG tablet Take 20 mg by mouth daily as needed for heartburn or indigestion.    . Ferrous Sulfate (IRON) 325 (65 Fe) MG TABS Take 325 mg by mouth 3 (three) times daily.  01/31/2018: Stopped for procedure  . Fluticasone-Salmeterol (ADVAIR DISKUS) 500-50 MCG/DOSE AEPB Inhale 1 puff into the lungs 2 (two) times daily as needed (for shortness of breath or wheezing).    . gabapentin (NEURONTIN) 800 MG tablet Take 800 mg by mouth 3 (three) times daily as needed (for pain).    . INVOKAMET 50-500 MG TABS TAKE 1 TABLET BY MOUTH TWICE A DAY   . Ipratropium-Albuterol (COMBIVENT RESPIMAT) 20-100 MCG/ACT AERS respimat Inhale 1 puff into the lungs every 6 (six) hours.   Marland Kitchen ipratropium-albuterol (DUONEB) 0.5-2.5 (3) MG/3ML SOLN  Take 3 mLs by nebulization every 6 (six) hours as needed (for shortness of breath or wheezing).    Marland Kitchen lidocaine (XYLOCAINE) 5 % ointment APPLY A SMALL AMOUNT TO AFFECTED AREA 2-3 TIMES A DAY   . LINZESS 145 MCG CAPS capsule TAKE 1 CAPSULE (145 MCG TOTAL) BY MOUTH DAILY BEFORE BREAKFAST.   Marland Kitchen lisinopril (PRINIVIL,ZESTRIL) 10 MG tablet TAKE 1 TAB DAILY IN MORNING (Patient taking differently: Take 10 mg by mouth daily. )   . metoprolol tartrate (LOPRESSOR) 50 MG tablet TAKE 1 TABLET BY MOUTH TWICE A DAY (Patient taking differently: Take 50 mg by mouth 2 (two) times daily. )   . montelukast (SINGULAIR) 10 MG tablet TAKE 1 TABLET BY MOUTH DAILY FOR ASTHMA   . oxymetazoline (AFRIN) 0.05 % nasal spray Place 1  spray into both nostrils 2 (two) times daily as needed for congestion.   . phentermine (ADIPEX-P) 37.5 MG tablet Take 1 tablet (37.5 mg total) by mouth daily before breakfast.   . SYMBICORT 160-4.5 MCG/ACT inhaler INHALE 2 PUFF TWICE A DAY   . traMADol (ULTRAM) 50 MG tablet Take 1 tablet (50 mg total) by mouth every 8 (eight) hours as needed for moderate pain.   Marland Kitchen tretinoin (RETIN-A) 0.025 % cream Apply 1 application topically at bedtime as needed (for eczema on face).    Marland Kitchen umeclidinium bromide (INCRUSE ELLIPTA) 62.5 MCG/INH AEPB Inhale 1 puff into the lungs daily.   Marland Kitchen VICTOZA 18 MG/3ML SOPN TAKE 1.8 MG EVERY MORNING (Patient taking differently: Inject 1.8 mg into the skin daily. )   . warfarin (COUMADIN) 5 MG tablet Take a whole tablet on Tue and Friday.  Take a 1/2 tablet on Mon, Wednesday, Thursday, Saturday and sunday   . [DISCONTINUED] acyclovir (ZOVIRAX) 400 MG tablet Take 1 tablet (400 mg total) by mouth 2 (two) times daily.   . [DISCONTINUED] predniSONE (DELTASONE) 10 MG tablet Use per dose pack   . predniSONE (DELTASONE) 5 MG tablet Take 2 tablets (10 mg total) by mouth daily with breakfast.    No facility-administered encounter medications on file as of 08/27/2018.     Surgical History: Past Surgical History:  Procedure Laterality Date  . APPENDECTOMY    . COLONOSCOPY WITH PROPOFOL N/A 02/02/2018   Procedure: COLONOSCOPY WITH PROPOFOL;  Surgeon: Jonathon Bellows, MD;  Location: [facility] ENDOSCOPY;  Service: Gastroenterology;  Laterality: N/A;  . LAPAROSCOPIC APPENDECTOMY N/A 02/05/2018   Procedure: APPENDECTOMY LAPAROSCOPIC;  Surgeon: Jules Husbands, MD;  Location: [facility] ORS;  Service: General;  Laterality: N/A;  . right arm surgery    . TUBAL LIGATION      Medical History: Past Medical History:  Diagnosis Date  . Asthma   . Atopic dermatitis   . Constipation   . COPD (chronic obstructive pulmonary disease) ([place])   . Diabetes mellitus without complication ([place])   . DVT (deep venous  thrombosis) ([place])   . GERD (gastroesophageal reflux disease)   . Hyperlipidemia   . Hypertension   . Rheumatoid arthritis ([place])     Family History: Family History  Problem Relation Age of Onset  . Breast cancer Mother   . Hypertension Mother   . Diabetes Mother   . Hypertension Father   . Heart failure Father     Social History   Socioeconomic History  . Marital status: Single    Spouse name: Not on file  . Number of children: Not on file  . Years of education: Not on file  . Highest education  level: Not on file  Occupational History  . Not on file  Social Needs  . Financial resource strain: Not on file  . Food insecurity:    Worry: Not on file    Inability: Not on file  . Transportation needs:    Medical: Not on file    Non-medical: Not on file  Tobacco Use  . Smoking status: Former Research scientist (life sciences)  . Smokeless tobacco: Never Used  Substance and Sexual Activity  . Alcohol use: No  . Drug use: No  . Sexual activity: Yes  Lifestyle  . Physical activity:    Days per week: Not on file    Minutes per session: Not on file  . Stress: Not on file  Relationships  . Social connections:    Talks on phone: Not on file    Gets together: Not on file    Attends religious service: Not on file    Active member of club or organization: Not on file    Attends meetings of clubs or organizations: Not on file    Relationship status: Not on file  . Intimate partner violence:    Fear of current or ex partner: Not on file    Emotionally abused: Not on file    Physically abused: Not on file    Forced sexual activity: Not on file  Other Topics Concern  . Not on file  Social History Narrative  . Not on file      Review of Systems  Constitutional: Negative for chills, fatigue and unexpected weight change.  HENT: Negative for congestion, rhinorrhea, sneezing and sore throat.   Eyes: Negative for photophobia, pain and redness.  Respiratory: Negative for cough, chest tightness and  shortness of breath.   Cardiovascular: Negative for chest pain and palpitations.  Gastrointestinal: Negative for abdominal pain, constipation, diarrhea, nausea and vomiting.  Endocrine: Negative.   Genitourinary: Negative for dysuria and frequency.  Musculoskeletal: Negative for arthralgias, back pain, joint swelling and neck pain.  Skin: Negative for rash.  Allergic/Immunologic: Negative.   Neurological: Negative for tremors and numbness.  Hematological: Negative for adenopathy. Does not bruise/bleed easily.  Psychiatric/Behavioral: Negative for behavioral problems and sleep disturbance. The patient is not nervous/anxious.     Vital Signs: There were no vitals taken for this visit.   Observation/Objective:  Pt speaking in full sentences, conversing with ease.  Sounds at baseline.    Assessment/Plan: 1. Uncontrolled type 2 diabetes mellitus with hyperglycemia ([place]) Pt will come in to get A1C checked in the next week.  Stable at this time.    2. Anticoagulant long-term use Last INR on file, 1.0.  Will recheck at office visit in a week.  Continue current dosing of coumadin.  3. Essential (primary) hypertension Stable, continue present management.   4. Neck pain Refilled patients Prednisone - predniSONE (DELTASONE) 5 MG tablet; Take 2 tablets (10 mg total) by mouth daily with breakfast.  Dispense: 60 tablet; Refill: 1  5. Morbid obesity (HCC) Obesity Counseling: Risk Assessment: An assessment of behavioral risk factors was made today and includes lack of exercise sedentary lifestyle, lack of portion control and poor dietary habits.  Risk Modification Advice: She was counseled on portion control guidelines. Restricting daily caloric intake to. . The detrimental long term effects of obesity on her health and ongoing poor compliance was also discussed with the patient.  6. Herpes simplex Refilled patients acyclovir.  - acyclovir (ZOVIRAX) 400 MG tablet; Take 1 tablet (400 mg  total) by mouth 2 (  two) times daily.  Dispense: 60 tablet; Refill: 1  General Counseling: Lurene verbalizes understanding of the findings of today's phone visit and agrees with plan of treatment. I have discussed any further diagnostic evaluation that may be needed or ordered today. We also reviewed her medications today. she has been encouraged to call the office with any questions or concerns that should arise related to todays visit.    No orders of the defined types were placed in this encounter.   Meds ordered this encounter  Medications  . acyclovir (ZOVIRAX) 400 MG tablet    Sig: Take 1 tablet (400 mg total) by mouth 2 (two) times daily.    Dispense:  60 tablet    Refill:  1  . predniSONE (DELTASONE) 5 MG tablet    Sig: Take 2 tablets (10 mg total) by mouth daily with breakfast.    Dispense:  60 tablet    Refill:  1    Time spent: 11 Minutes    Orson Gear AGNP-C Internal medicine

## 2018-08-30 ENCOUNTER — Other Ambulatory Visit: Payer: Self-pay

## 2018-08-30 DIAGNOSIS — M542 Cervicalgia: Secondary | ICD-10-CM

## 2018-08-31 ENCOUNTER — Other Ambulatory Visit: Payer: Self-pay | Admitting: Adult Health

## 2018-08-31 ENCOUNTER — Other Ambulatory Visit: Payer: Self-pay

## 2018-08-31 DIAGNOSIS — B009 Herpesviral infection, unspecified: Secondary | ICD-10-CM

## 2018-08-31 MED ORDER — LISINOPRIL 10 MG PO TABS: ORAL_TABLET | ORAL | 1 refills | Status: DC

## 2018-09-03 ENCOUNTER — Other Ambulatory Visit: Payer: Self-pay

## 2018-09-03 ENCOUNTER — Other Ambulatory Visit: Payer: Self-pay | Admitting: Adult Health

## 2018-09-03 ENCOUNTER — Ambulatory Visit (INDEPENDENT_AMBULATORY_CARE_PROVIDER_SITE_OTHER): Payer: Medicare Other

## 2018-09-03 DIAGNOSIS — Z7901 Long term (current) use of anticoagulants: Secondary | ICD-10-CM | POA: Diagnosis not present

## 2018-09-03 DIAGNOSIS — E1165 Type 2 diabetes mellitus with hyperglycemia: Secondary | ICD-10-CM

## 2018-09-03 LAB — POCT GLYCOSYLATED HEMOGLOBIN (HGB A1C): Hemoglobin A1C: 7 % — AB (ref 4.0–5.6)

## 2018-09-03 LAB — POCT INR: INR: 1.1 — AB (ref 2.0–3.0)

## 2018-09-03 MED ORDER — PHENTERMINE HCL 37.5 MG PO TABS: 37.5000 mg | ORAL_TABLET | Freq: Every day | ORAL | 0 refills | Status: DC

## 2018-09-03 MED ORDER — BENAZEPRIL HCL 10 MG PO TABS: 10.0000 mg | ORAL_TABLET | Freq: Every day | ORAL | 3 refills | Status: DC

## 2018-09-03 NOTE — Progress Notes (Signed)
PT/inr 1.1 12.8 Per Adam take 5mg  tues and Thursday , rest of week 2.5 mg recheck one week . a1c  7.0

## 2018-09-03 NOTE — Progress Notes (Signed)
Refilled patients Phentermine at this time now that her arm issues have resolved.

## 2018-09-12 ENCOUNTER — Ambulatory Visit: Payer: Medicare Other

## 2018-09-17 ENCOUNTER — Ambulatory Visit: Payer: Medicare Other

## 2018-09-19 ENCOUNTER — Other Ambulatory Visit: Payer: Self-pay | Admitting: Adult Health

## 2018-09-19 DIAGNOSIS — B009 Herpesviral infection, unspecified: Secondary | ICD-10-CM

## 2018-10-02 ENCOUNTER — Other Ambulatory Visit: Payer: Self-pay

## 2018-10-02 MED ORDER — MONTELUKAST SODIUM 10 MG PO TABS: ORAL_TABLET | ORAL | 1 refills | Status: DC

## 2018-10-04 ENCOUNTER — Ambulatory Visit (INDEPENDENT_AMBULATORY_CARE_PROVIDER_SITE_OTHER): Payer: Medicare Other | Admitting: Adult Health

## 2018-10-04 ENCOUNTER — Encounter: Payer: Self-pay | Admitting: Adult Health

## 2018-10-04 ENCOUNTER — Other Ambulatory Visit: Payer: Self-pay

## 2018-10-04 VITALS — BP 110/72 | HR 85 | Temp 98.1°F | Resp 16 | Ht 66.0 in | Wt 247.0 lb

## 2018-10-04 DIAGNOSIS — I10 Essential (primary) hypertension: Secondary | ICD-10-CM

## 2018-10-04 DIAGNOSIS — E1165 Type 2 diabetes mellitus with hyperglycemia: Secondary | ICD-10-CM

## 2018-10-04 DIAGNOSIS — M542 Cervicalgia: Secondary | ICD-10-CM | POA: Diagnosis not present

## 2018-10-04 DIAGNOSIS — Z7901 Long term (current) use of anticoagulants: Secondary | ICD-10-CM | POA: Diagnosis not present

## 2018-10-04 LAB — POCT INR: INR: 1 — AB (ref 2.0–3.0)

## 2018-10-04 MED ORDER — PREDNISONE 5 MG PO TABS: 10.0000 mg | ORAL_TABLET | Freq: Every day | ORAL | 1 refills | Status: DC

## 2018-10-04 NOTE — Progress Notes (Signed)
[facility] [place], [place] [address]  Internal MEDICINE  Office Visit Note  Patient Name: Brenda Santana  [id number]  [id number]  Date of Service: 10/04/2018  Chief Complaint  Patient presents with  . Medical Management of Chronic Issues    weight loss management   . Atrial Fibrillation    HPI  Pt is here for follow up on INR, as well as weight loss management. She has not lost any weight and seems to be maintaining at 247 pounds. Her INR today is 1.0, she is currently taking 5mg  of coumadin on tues and Thursday.  She is takin 2.5mg  the other days.  Will increase her coumadin today.    Current Medication: Outpatient Encounter Medications as of 10/04/2018  Medication Sig Note  . acyclovir (ZOVIRAX) 400 MG tablet TAKE 1 TABLET BY MOUTH TWICE A DAY   . acyclovir ointment (ZOVIRAX) 5 % APPLY TOPICALLY EVERY 3 (THREE) HOURS.   Marland Kitchen albuterol (PROVENTIL HFA) 108 (90 Base) MCG/ACT inhaler Inhale 2 puffs into the lungs every 6 (six) hours as needed for wheezing or shortness of breath.   . allopurinol (ZYLOPRIM) 300 MG tablet TAKE ONE TAB AT NIGHT FOR GOUT   . aspirin EC 81 MG tablet Take 81 mg by mouth daily.   . BD PEN NEEDLE NANO U/F 32G X 4 MM MISC USE DAILY WITH VICTOZA   . benazepril (LOTENSIN) 10 MG tablet Take 1 tablet (10 mg total) by mouth daily.   . Biotin 5 MG CAPS Take 5 mg by mouth daily.    . cetirizine (ZYRTEC ALLERGY) 10 MG tablet Take 10 mg by mouth daily as needed for allergies.    . chlorthalidone (HYGROTON) 25 MG tablet TAKE 1 TABLET BY MOUTH EVERY DAY IN THE MORNING   . clobetasol cream (TEMOVATE) 9.93 % Apply 1 application topically 2 (two) times daily as needed (for eczema on hands).    . diclofenac sodium (VOLTAREN) 1 % GEL APPLY 4 G TOPICALLY FOUR (4) TIMES A DAY.   Marland Kitchen docusate sodium (COLACE) 100 MG capsule Take 100 mg by mouth daily.    Marland Kitchen doxycycline (VIBRA-TABS) 100 MG tablet Take 100 mg by mouth 2 (two) times daily.  01/31/2018:  Continuous  . famotidine (PEPCID) 20 MG tablet Take 20 mg by mouth daily as needed for heartburn or indigestion.    . Ferrous Sulfate (IRON) 325 (65 Fe) MG TABS Take 325 mg by mouth 3 (three) times daily.  01/31/2018: Stopped for procedure  . Fluticasone-Salmeterol (ADVAIR DISKUS) 500-50 MCG/DOSE AEPB Inhale 1 puff into the lungs 2 (two) times daily as needed (for shortness of breath or wheezing).    . gabapentin (NEURONTIN) 800 MG tablet Take 800 mg by mouth 3 (three) times daily as needed (for pain).    . INVOKAMET 50-500 MG TABS TAKE 1 TABLET BY MOUTH TWICE A DAY   . Ipratropium-Albuterol (COMBIVENT RESPIMAT) 20-100 MCG/ACT AERS respimat Inhale 1 puff into the lungs every 6 (six) hours.   Marland Kitchen ipratropium-albuterol (DUONEB) 0.5-2.5 (3) MG/3ML SOLN Take 3 mLs by nebulization every 6 (six) hours as needed (for shortness of breath or wheezing).    Marland Kitchen lidocaine (XYLOCAINE) 5 % ointment APPLY A SMALL AMOUNT TO AFFECTED AREA 2-3 TIMES A DAY   . LINZESS 145 MCG CAPS capsule TAKE 1 CAPSULE (145 MCG TOTAL) BY MOUTH DAILY BEFORE BREAKFAST.   Marland Kitchen lisinopril (ZESTRIL) 10 MG tablet TAKE 1 TAB DAILY IN MORNING (Patient taking differently: Change in therapy  due to backorder on lisinopril)   . metoprolol tartrate (LOPRESSOR) 50 MG tablet TAKE 1 TABLET BY MOUTH TWICE A DAY (Patient taking differently: Take 50 mg by mouth 2 (two) times daily. )   . montelukast (SINGULAIR) 10 MG tablet TAKE 1 TABLET BY MOUTH DAILY FOR ASTHMA   . oxymetazoline (AFRIN) 0.05 % nasal spray Place 1 spray into both nostrils 2 (two) times daily as needed for congestion.   . phentermine (ADIPEX-P) 37.5 MG tablet Take 1 tablet (37.5 mg total) by mouth daily before breakfast.   . predniSONE (DELTASONE) 5 MG tablet Take 2 tablets (10 mg total) by mouth daily with breakfast.   . SYMBICORT 160-4.5 MCG/ACT inhaler INHALE 2 PUFF TWICE A DAY   . traMADol (ULTRAM) 50 MG tablet Take 1 tablet (50 mg total) by mouth every 8 (eight) hours as needed for  moderate pain.   Marland Kitchen tretinoin (RETIN-A) 0.025 % cream Apply 1 application topically at bedtime as needed (for eczema on face).    Marland Kitchen umeclidinium bromide (INCRUSE ELLIPTA) 62.5 MCG/INH AEPB Inhale 1 puff into the lungs daily.   Marland Kitchen VICTOZA 18 MG/3ML SOPN TAKE 1.8 MG EVERY MORNING (Patient taking differently: Inject 1.8 mg into the skin daily. )   . warfarin (COUMADIN) 5 MG tablet Take a whole tablet on Tue and Friday.  Take a 1/2 tablet on Mon, Wednesday, Thursday, Saturday and sunday (Patient taking differently: Take a whole tablet on Tue and Thurs .Marland Kitchen Sunday, Monday Wednesday Friday take half tabs)   . [DISCONTINUED] azithromycin (ZITHROMAX) 250 MG tablet Take as directed (Patient not taking: Reported on 10/04/2018)    No facility-administered encounter medications on file as of 10/04/2018.     Surgical History: Past Surgical History:  Procedure Laterality Date  . APPENDECTOMY    . COLONOSCOPY WITH PROPOFOL N/A 02/02/2018   Procedure: COLONOSCOPY WITH PROPOFOL;  Surgeon: Jonathon Bellows, MD;  Location: [facility] ENDOSCOPY;  Service: Gastroenterology;  Laterality: N/A;  . LAPAROSCOPIC APPENDECTOMY N/A 02/05/2018   Procedure: APPENDECTOMY LAPAROSCOPIC;  Surgeon: Jules Husbands, MD;  Location: [facility] ORS;  Service: General;  Laterality: N/A;  . right arm surgery    . TUBAL LIGATION      Medical History: Past Medical History:  Diagnosis Date  . Asthma   . Atopic dermatitis   . Constipation   . COPD (chronic obstructive pulmonary disease) ([place])   . Diabetes mellitus without complication ([place])   . DVT (deep venous thrombosis) ([place])   . GERD (gastroesophageal reflux disease)   . Hyperlipidemia   . Hypertension   . Rheumatoid arthritis ([place])     Family History: Family History  Problem Relation Age of Onset  . Breast cancer Mother   . Hypertension Mother   . Diabetes Mother   . Hypertension Father   . Heart failure Father     Social History   Socioeconomic History  . Marital status: Single     Spouse name: Not on file  . Number of children: Not on file  . Years of education: Not on file  . Highest education level: Not on file  Occupational History  . Not on file  Social Needs  . Financial resource strain: Not on file  . Food insecurity:    Worry: Not on file    Inability: Not on file  . Transportation needs:    Medical: Not on file    Non-medical: Not on file  Tobacco Use  . Smoking status: Former Research scientist (life sciences)  . Smokeless tobacco:  Never Used  Substance and Sexual Activity  . Alcohol use: No  . Drug use: No  . Sexual activity: Yes  Lifestyle  . Physical activity:    Days per week: Not on file    Minutes per session: Not on file  . Stress: Not on file  Relationships  . Social connections:    Talks on phone: Not on file    Gets together: Not on file    Attends religious service: Not on file    Active member of club or organization: Not on file    Attends meetings of clubs or organizations: Not on file    Relationship status: Not on file  . Intimate partner violence:    Fear of current or ex partner: Not on file    Emotionally abused: Not on file    Physically abused: Not on file    Forced sexual activity: Not on file  Other Topics Concern  . Not on file  Social History Narrative  . Not on file      Review of Systems  Constitutional: Negative for chills, fatigue and unexpected weight change.  HENT: Negative for congestion, rhinorrhea, sneezing and sore throat.   Eyes: Negative for photophobia, pain and redness.  Respiratory: Negative for cough, chest tightness and shortness of breath.   Cardiovascular: Negative for chest pain and palpitations.  Gastrointestinal: Negative for abdominal pain, constipation, diarrhea, nausea and vomiting.  Endocrine: Negative.   Genitourinary: Negative for dysuria and frequency.  Musculoskeletal: Negative for arthralgias, back pain, joint swelling and neck pain.  Skin: Negative for rash.  Allergic/Immunologic: Negative.    Neurological: Negative for tremors and numbness.  Hematological: Negative for adenopathy. Does not bruise/bleed easily.  Psychiatric/Behavioral: Negative for behavioral problems and sleep disturbance. The patient is not nervous/anxious.     Vital Signs: BP 110/72 (BP Location: Left Arm, Patient Position: Sitting, Cuff Size: Normal)   Pulse 85   Temp 98.1 F (36.7 C)   Resp 16   Ht 5\' 6"  (1.676 m)   Wt 247 lb (112 kg)   SpO2 99%   BMI 39.87 kg/m    Physical Exam Vitals signs and nursing note reviewed.  Constitutional:      General: She is not in acute distress.    Appearance: She is well-developed. She is not diaphoretic.  HENT:     Head: Normocephalic and atraumatic.     Mouth/Throat:     Pharynx: No oropharyngeal exudate.  Eyes:     Pupils: Pupils are equal, round, and reactive to light.  Neck:     Musculoskeletal: Normal range of motion and neck supple.     Thyroid: No thyromegaly.     Vascular: No JVD.     Trachea: No tracheal deviation.  Cardiovascular:     Rate and Rhythm: Normal rate and regular rhythm.     Heart sounds: Normal heart sounds. No murmur. No friction rub. No gallop.   Pulmonary:     Effort: Pulmonary effort is normal. No respiratory distress.     Breath sounds: Normal breath sounds. No wheezing or rales.  Chest:     Chest wall: No tenderness.  Abdominal:     Palpations: Abdomen is soft.     Tenderness: There is no abdominal tenderness. There is no guarding.  Musculoskeletal: Normal range of motion.  Lymphadenopathy:     Cervical: No cervical adenopathy.  Skin:    General: Skin is warm and dry.  Neurological:     Mental Status:  She is alert and oriented to person, place, and time.     Cranial Nerves: No cranial nerve deficit.  Psychiatric:        Behavior: Behavior normal.        Thought Content: Thought content normal.        Judgment: Judgment normal.     Assessment/Plan: 1. Essential (primary) hypertension Stable, continue present  management.  BP today 110/72.  2. Neck pain Take prednisone dose pack for inflamation, and return to office if - predniSONE (DELTASONE) 5 MG tablet; Take 2 tablets (10 mg total) by mouth daily with breakfast.  Dispense: 60 tablet; Refill: 1  3. Encounter for current long-term use of anticoagulants INR 1.0, increase coumadin as discussed. Follow up in 3 weeks for recheck. - POCT INR  4. Morbid obesity (HCC) Obesity Counseling: Risk Assessment: An assessment of behavioral risk factors was made today and includes lack of exercise sedentary lifestyle, lack of portion control and poor dietary habits.  Risk Modification Advice: She was counseled on portion control guidelines. Restricting daily caloric intake to. . The detrimental long term effects of obesity on her health and ongoing poor compliance was also discussed with the patient.   5. Uncontrolled type 2 diabetes mellitus with hyperglycemia (HCC) Stable, continue present management. Last a1c 7.0  General Counseling: Emilly verbalizes understanding of the findings of todays visit and agrees with plan of treatment. I have discussed any further diagnostic evaluation that may be needed or ordered today. We also reviewed her medications today. she has been encouraged to call the office with any questions or concerns that should arise related to todays visit.    Orders Placed This Encounter  Procedures  . POCT INR    No orders of the defined types were placed in this encounter.   Time spent: 20 Minutes   This patient was seen by Orson Gear AGNP-C in Collaboration with Dr Lavera Guise as a part of collaborative care agreement     Kendell Bane AGNP-C Internal medicine

## 2018-10-12 ENCOUNTER — Other Ambulatory Visit: Payer: Self-pay | Admitting: Internal Medicine

## 2018-10-12 ENCOUNTER — Ambulatory Visit: Payer: Medicare Other

## 2018-10-17 ENCOUNTER — Other Ambulatory Visit: Payer: Self-pay

## 2018-10-17 DIAGNOSIS — B009 Herpesviral infection, unspecified: Secondary | ICD-10-CM

## 2018-10-17 MED ORDER — ACYCLOVIR 400 MG PO TABS: 400.0000 mg | ORAL_TABLET | Freq: Two times a day (BID) | ORAL | 1 refills | Status: DC

## 2018-10-29 ENCOUNTER — Other Ambulatory Visit: Payer: Self-pay

## 2018-10-29 MED ORDER — LINACLOTIDE 145 MCG PO CAPS: ORAL_CAPSULE | ORAL | 3 refills | Status: DC

## 2018-11-05 ENCOUNTER — Other Ambulatory Visit: Payer: Self-pay | Admitting: Adult Health

## 2018-11-05 MED ORDER — METOPROLOL TARTRATE 50 MG PO TABS: 50.0000 mg | ORAL_TABLET | Freq: Two times a day (BID) | ORAL | 3 refills | Status: DC

## 2018-11-10 ENCOUNTER — Other Ambulatory Visit: Payer: Self-pay | Admitting: Adult Health

## 2018-11-10 DIAGNOSIS — B009 Herpesviral infection, unspecified: Secondary | ICD-10-CM

## 2018-11-13 ENCOUNTER — Other Ambulatory Visit: Payer: Self-pay

## 2018-11-13 ENCOUNTER — Ambulatory Visit (INDEPENDENT_AMBULATORY_CARE_PROVIDER_SITE_OTHER): Payer: Medicare Other

## 2018-11-13 DIAGNOSIS — Z7901 Long term (current) use of anticoagulants: Secondary | ICD-10-CM | POA: Diagnosis not present

## 2018-11-13 LAB — POCT INR: INR: 1.2 — AB (ref 2.0–3.0)

## 2018-11-13 MED ORDER — ZOSTAVAX 19400 UNT/0.65ML ~~LOC~~ SUSR: 0.6500 mL | Freq: Once | SUBCUTANEOUS | 0 refills | Status: AC

## 2018-11-13 NOTE — Progress Notes (Signed)
Pt Inr 1.2 as per dr Humphrey Rolls advised pt take coumadin 5 mg daily and  Follow up next week for inr check and office visit

## 2018-11-19 ENCOUNTER — Other Ambulatory Visit: Payer: Self-pay

## 2018-11-19 DIAGNOSIS — Z20822 Contact with and (suspected) exposure to covid-19: Secondary | ICD-10-CM

## 2018-11-22 ENCOUNTER — Ambulatory Visit: Payer: Medicare Other

## 2018-11-22 LAB — NOVEL CORONAVIRUS, NAA: SARS-CoV-2, NAA: NOT DETECTED

## 2018-11-24 ENCOUNTER — Other Ambulatory Visit: Payer: Self-pay | Admitting: Internal Medicine

## 2018-11-26 ENCOUNTER — Other Ambulatory Visit: Payer: Self-pay

## 2018-11-26 MED ORDER — ACYCLOVIR 5 % EX OINT: TOPICAL_OINTMENT | CUTANEOUS | 1 refills | Status: DC

## 2018-11-28 ENCOUNTER — Encounter: Payer: Self-pay | Admitting: Adult Health

## 2018-11-28 ENCOUNTER — Ambulatory Visit (INDEPENDENT_AMBULATORY_CARE_PROVIDER_SITE_OTHER): Payer: Medicare Other | Admitting: Adult Health

## 2018-11-28 ENCOUNTER — Telehealth: Payer: Self-pay | Admitting: Internal Medicine

## 2018-11-28 ENCOUNTER — Other Ambulatory Visit: Payer: Self-pay

## 2018-11-28 VITALS — BP 113/66 | HR 74 | Resp 16 | Ht 63.0 in | Wt 246.0 lb

## 2018-11-28 DIAGNOSIS — Z7901 Long term (current) use of anticoagulants: Secondary | ICD-10-CM | POA: Diagnosis not present

## 2018-11-28 DIAGNOSIS — I10 Essential (primary) hypertension: Secondary | ICD-10-CM

## 2018-11-28 DIAGNOSIS — E782 Mixed hyperlipidemia: Secondary | ICD-10-CM

## 2018-11-28 DIAGNOSIS — E1165 Type 2 diabetes mellitus with hyperglycemia: Secondary | ICD-10-CM

## 2018-11-28 LAB — POCT GLYCOSYLATED HEMOGLOBIN (HGB A1C): Hemoglobin A1C: 7.3 % — AB (ref 4.0–5.6)

## 2018-11-28 LAB — POCT INR: INR: 1.5 — AB (ref 2.0–3.0)

## 2018-11-28 NOTE — Telephone Encounter (Signed)
Patient informed of COVID results

## 2018-11-28 NOTE — Progress Notes (Signed)
[facility] [address] [place], [place] [address]  Internal MEDICINE  Office Visit Note  Patient Name: Brenda Santana  [id number]  [id number]  Date of Service: 11/28/2018  Chief Complaint  Patient presents with  . Medical Management of Chronic Issues  . Diabetes  . Hyperlipidemia  . Hypertension    HPI  Pt is here for follow up on DM, HLD, and HTN.  Pts blood pressure is well controlled at this visit.  She denies any chest pain, sob or palpitations.  Her A1C is 7.3 today.  It is slightly up from 7.0 at last check. We once again discussed dietary modifications to improve her number.  Her INR is 1.5 today. She is currently taking 5mg  daily of coumadin.         Current Medication: Outpatient Encounter Medications as of 11/28/2018  Medication Sig Note  . acyclovir (ZOVIRAX) 400 MG tablet TAKE 1 TABLET BY MOUTH TWICE A DAY   . acyclovir ointment (ZOVIRAX) 5 % APPLY TOPICALLY EVERY 3 (THREE) HOURS.   Marland Kitchen allopurinol (ZYLOPRIM) 300 MG tablet TAKE ONE TAB AT NIGHT FOR GOUT   . aspirin EC 81 MG tablet Take 81 mg by mouth daily.   . BD PEN NEEDLE NANO U/F 32G X 4 MM MISC USE DAILY WITH VICTOZA   . benazepril (LOTENSIN) 10 MG tablet Take 1 tablet (10 mg total) by mouth daily.   . Biotin 5 MG CAPS Take 5 mg by mouth daily.    . cetirizine (ZYRTEC ALLERGY) 10 MG tablet Take 10 mg by mouth daily as needed for allergies.    . chlorthalidone (HYGROTON) 25 MG tablet TAKE 1 TABLET BY MOUTH EVERY DAY IN THE MORNING   . clobetasol cream (TEMOVATE) 2.95 % Apply 1 application topically 2 (two) times daily as needed (for eczema on hands).    . diclofenac sodium (VOLTAREN) 1 % GEL APPLY 4 G TOPICALLY FOUR (4) TIMES A DAY.   Marland Kitchen docusate sodium (COLACE) 100 MG capsule Take 100 mg by mouth daily.    Marland Kitchen doxycycline (VIBRA-TABS) 100 MG tablet Take 100 mg by mouth 2 (two) times daily.  01/31/2018: Continuous  . famotidine (PEPCID) 20 MG tablet Take 20 mg by mouth daily as needed for heartburn or  indigestion.    . Ferrous Sulfate (IRON) 325 (65 Fe) MG TABS Take 325 mg by mouth 3 (three) times daily.  01/31/2018: Stopped for procedure  . Fluticasone-Salmeterol (ADVAIR DISKUS) 500-50 MCG/DOSE AEPB Inhale 1 puff into the lungs 2 (two) times daily as needed (for shortness of breath or wheezing).    . gabapentin (NEURONTIN) 800 MG tablet Take 800 mg by mouth 3 (three) times daily as needed (for pain).    . INVOKAMET 50-500 MG TABS TAKE 1 TABLET BY MOUTH TWICE A DAY   . Ipratropium-Albuterol (COMBIVENT RESPIMAT) 20-100 MCG/ACT AERS respimat Inhale 1 puff into the lungs every 6 (six) hours.   Marland Kitchen ipratropium-albuterol (DUONEB) 0.5-2.5 (3) MG/3ML SOLN Take 3 mLs by nebulization every 6 (six) hours as needed (for shortness of breath or wheezing).    Marland Kitchen lidocaine (XYLOCAINE) 5 % ointment APPLY A SMALL AMOUNT TO AFFECTED AREA 2-3 TIMES A DAY   . linaclotide (LINZESS) 145 MCG CAPS capsule TAKE 1 CAPSULE (145 MCG TOTAL) BY MOUTH DAILY BEFORE BREAKFAST.   Marland Kitchen lisinopril (ZESTRIL) 10 MG tablet TAKE 1 TAB DAILY IN MORNING (Patient taking differently: Change in therapy due to backorder on lisinopril)   . metoprolol tartrate (LOPRESSOR) 50 MG  tablet Take 1 tablet (50 mg total) by mouth 2 (two) times daily.   . montelukast (SINGULAIR) 10 MG tablet TAKE 1 TABLET BY MOUTH DAILY FOR ASTHMA   . oxymetazoline (AFRIN) 0.05 % nasal spray Place 1 spray into both nostrils 2 (two) times daily as needed for congestion.   . phentermine (ADIPEX-P) 37.5 MG tablet Take 1 tablet (37.5 mg total) by mouth daily before breakfast.   . predniSONE (DELTASONE) 5 MG tablet Take 2 tablets (10 mg total) by mouth daily with breakfast.   . PROAIR HFA 108 (90 Base) MCG/ACT inhaler INHALE 2 PUFFS INTO THE LUNGS EVERY 4 (FOUR) HOURS AS NEEDED FOR WHEEZING OR SHORTNESS OF BREATH.   . SYMBICORT 160-4.5 MCG/ACT inhaler INHALE 2 PUFF TWICE A DAY   . traMADol (ULTRAM) 50 MG tablet Take 1 tablet (50 mg total) by mouth every 8 (eight) hours as needed  for moderate pain.   Marland Kitchen tretinoin (RETIN-A) 0.025 % cream Apply 1 application topically at bedtime as needed (for eczema on face).    Marland Kitchen umeclidinium bromide (INCRUSE ELLIPTA) 62.5 MCG/INH AEPB Inhale 1 puff into the lungs daily.   Marland Kitchen VICTOZA 18 MG/3ML SOPN TAKE 1.8 MG EVERY MORNING (Patient taking differently: Inject 1.8 mg into the skin daily. )   . warfarin (COUMADIN) 5 MG tablet Take a whole tablet on Tue and Friday.  Take a 1/2 tablet on Mon, Wednesday, Thursday, Saturday and sunday (Patient taking differently: Take a whole tablet on Tue and Thurs .Marland Kitchen Sunday, Monday Wednesday Friday take half tabs)    No facility-administered encounter medications on file as of 11/28/2018.     Surgical History: Past Surgical History:  Procedure Laterality Date  . APPENDECTOMY    . COLONOSCOPY WITH PROPOFOL N/A 02/02/2018   Procedure: COLONOSCOPY WITH PROPOFOL;  Surgeon: Jonathon Bellows, MD;  Location: [facility] ENDOSCOPY;  Service: Gastroenterology;  Laterality: N/A;  . LAPAROSCOPIC APPENDECTOMY N/A 02/05/2018   Procedure: APPENDECTOMY LAPAROSCOPIC;  Surgeon: Jules Husbands, MD;  Location: [facility] ORS;  Service: General;  Laterality: N/A;  . right arm surgery    . TUBAL LIGATION      Medical History: Past Medical History:  Diagnosis Date  . Asthma   . Atopic dermatitis   . Constipation   . COPD (chronic obstructive pulmonary disease) ([place])   . Diabetes mellitus without complication ([place])   . DVT (deep venous thrombosis) ([place])   . GERD (gastroesophageal reflux disease)   . Hyperlipidemia   . Hypertension   . Rheumatoid arthritis ([place])     Family History: Family History  Problem Relation Age of Onset  . Breast cancer Mother   . Hypertension Mother   . Diabetes Mother   . Hypertension Father   . Heart failure Father     Social History   Socioeconomic History  . Marital status: Single    Spouse name: Not on file  . Number of children: Not on file  . Years of education: Not on file  . Highest  education level: Not on file  Occupational History  . Not on file  Social Needs  . Financial resource strain: Not on file  . Food insecurity    Worry: Not on file    Inability: Not on file  . Transportation needs    Medical: Not on file    Non-medical: Not on file  Tobacco Use  . Smoking status: Former Research scientist (life sciences)  . Smokeless tobacco: Never Used  Substance and Sexual Activity  . Alcohol use: No  .  Drug use: No  . Sexual activity: Yes  Lifestyle  . Physical activity    Days per week: Not on file    Minutes per session: Not on file  . Stress: Not on file  Relationships  . Social Herbalist on phone: Not on file    Gets together: Not on file    Attends religious service: Not on file    Active member of club or organization: Not on file    Attends meetings of clubs or organizations: Not on file    Relationship status: Not on file  . Intimate partner violence    Fear of current or ex partner: Not on file    Emotionally abused: Not on file    Physically abused: Not on file    Forced sexual activity: Not on file  Other Topics Concern  . Not on file  Social History Narrative  . Not on file      Review of Systems  Constitutional: Negative for chills, fatigue and unexpected weight change.  HENT: Negative for congestion, rhinorrhea, sneezing and sore throat.   Eyes: Negative for photophobia, pain and redness.  Respiratory: Negative for cough, chest tightness and shortness of breath.   Cardiovascular: Negative for chest pain and palpitations.  Gastrointestinal: Negative for abdominal pain, constipation, diarrhea, nausea and vomiting.  Endocrine: Negative.   Genitourinary: Negative for dysuria and frequency.  Musculoskeletal: Negative for arthralgias, back pain, joint swelling and neck pain.  Skin: Negative for rash.  Allergic/Immunologic: Negative.   Neurological: Negative for tremors and numbness.  Hematological: Negative for adenopathy. Does not bruise/bleed  easily.  Psychiatric/Behavioral: Negative for behavioral problems and sleep disturbance. The patient is not nervous/anxious.     Vital Signs: BP 113/66   Pulse 74   Resp 16   Ht 5\' 3"  (1.6 m)   Wt 246 lb (111.6 kg)   SpO2 99%   BMI 43.58 kg/m    Physical Exam Vitals signs and nursing note reviewed.  Constitutional:      General: She is not in acute distress.    Appearance: She is well-developed. She is not diaphoretic.  HENT:     Head: Normocephalic and atraumatic.     Mouth/Throat:     Pharynx: No oropharyngeal exudate.  Eyes:     Pupils: Pupils are equal, round, and reactive to light.  Neck:     Musculoskeletal: Normal range of motion and neck supple.     Thyroid: No thyromegaly.     Vascular: No JVD.     Trachea: No tracheal deviation.  Cardiovascular:     Rate and Rhythm: Normal rate and regular rhythm.     Heart sounds: Normal heart sounds. No murmur. No friction rub. No gallop.   Pulmonary:     Effort: Pulmonary effort is normal. No respiratory distress.     Breath sounds: Normal breath sounds. No wheezing or rales.  Chest:     Chest wall: No tenderness.  Abdominal:     Palpations: Abdomen is soft.     Tenderness: There is no abdominal tenderness. There is no guarding.  Musculoskeletal: Normal range of motion.  Lymphadenopathy:     Cervical: No cervical adenopathy.  Skin:    General: Skin is warm and dry.  Neurological:     Mental Status: She is alert and oriented to person, place, and time.     Cranial Nerves: No cranial nerve deficit.  Psychiatric:        Behavior: Behavior  normal.        Thought Content: Thought content normal.        Judgment: Judgment normal.    Assessment/Plan: 1. Uncontrolled type 2 diabetes mellitus with hyperglycemia (HCC) A1C 7.3 today.  - POCT HgB A1C  2. Encounter for current long-term use of anticoagulants Take 10 mg on Wednesdays, continue with 5mg  on all the other days of the week.  Return for recheck in 2 weeks.  -  POCT INR  3. Essential (primary) hypertension Stable, continue present management.   4. Morbid obesity ([place]) Obesity Counseling: Risk Assessment: An assessment of behavioral risk factors was made today and includes lack of exercise sedentary lifestyle, lack of portion control and poor dietary habits.  Risk Modification Advice: She was counseled on portion control guidelines. Restricting daily caloric intake to. . The detrimental long term effects of obesity on her health and ongoing poor compliance was also discussed with the patient.  5. Mixed hyperlipidemia Stable, continue present management.  General Counseling: shaquandra galano understanding of the findings of todays visit and agrees with plan of treatment. I have discussed any further diagnostic evaluation that may be needed or ordered today. We also reviewed her medications today. she has been encouraged to call the office with any questions or concerns that should arise related to todays visit.    Orders Placed This Encounter  Procedures  . POCT HgB A1C  . POCT INR    No orders of the defined types were placed in this encounter.   Time spent: 15 Minutes   This patient was seen by Orson Gear AGNP-C in Collaboration with Dr Lavera Guise as a part of collaborative care agreement     Kendell Bane AGNP-C Internal medicine

## 2018-12-03 ENCOUNTER — Other Ambulatory Visit: Payer: Self-pay | Admitting: Adult Health

## 2018-12-03 DIAGNOSIS — B009 Herpesviral infection, unspecified: Secondary | ICD-10-CM

## 2018-12-05 ENCOUNTER — Other Ambulatory Visit: Payer: Self-pay | Admitting: Adult Health

## 2018-12-05 DIAGNOSIS — M542 Cervicalgia: Secondary | ICD-10-CM

## 2018-12-10 ENCOUNTER — Ambulatory Visit: Payer: Medicare Other

## 2018-12-27 ENCOUNTER — Other Ambulatory Visit: Payer: Self-pay | Admitting: Internal Medicine

## 2018-12-27 DIAGNOSIS — B009 Herpesviral infection, unspecified: Secondary | ICD-10-CM

## 2018-12-28 ENCOUNTER — Other Ambulatory Visit: Payer: Self-pay | Admitting: Adult Health

## 2019-01-01 ENCOUNTER — Other Ambulatory Visit: Payer: Self-pay | Admitting: Adult Health

## 2019-01-15 ENCOUNTER — Other Ambulatory Visit: Payer: Self-pay

## 2019-01-15 ENCOUNTER — Ambulatory Visit (INDEPENDENT_AMBULATORY_CARE_PROVIDER_SITE_OTHER): Payer: Medicare Other

## 2019-01-15 ENCOUNTER — Other Ambulatory Visit: Payer: Self-pay | Admitting: Adult Health

## 2019-01-15 DIAGNOSIS — Z7901 Long term (current) use of anticoagulants: Secondary | ICD-10-CM

## 2019-01-15 LAB — POCT INR: INR: 1.5 — AB (ref 2.0–3.0)

## 2019-01-15 MED ORDER — TIZANIDINE HCL 4 MG PO TABS: 4.0000 mg | ORAL_TABLET | Freq: Three times a day (TID) | ORAL | 1 refills | Status: DC

## 2019-01-15 NOTE — Progress Notes (Signed)
Pt Inr 1.5 /17.9 , Tues and Thursday take a whole tablet and Sunday ,Monday, Wednesday and Friday , Patient has been taking two tablets on Wednesday and a whole tablet the rest of the week, will recheck in one week

## 2019-01-15 NOTE — Addendum Note (Signed)
Addended by: Lenon Oms on: 01/15/2019 08:48 AM   Modules accepted: Level of Service

## 2019-01-18 ENCOUNTER — Other Ambulatory Visit: Payer: Self-pay

## 2019-01-18 DIAGNOSIS — B009 Herpesviral infection, unspecified: Secondary | ICD-10-CM

## 2019-01-18 MED ORDER — ACYCLOVIR 400 MG PO TABS: 400.0000 mg | ORAL_TABLET | Freq: Two times a day (BID) | ORAL | 1 refills | Status: DC

## 2019-01-22 ENCOUNTER — Other Ambulatory Visit: Payer: Self-pay

## 2019-01-22 MED ORDER — INVOKAMET 50-500 MG PO TABS: 1.0000 | ORAL_TABLET | Freq: Two times a day (BID) | ORAL | 6 refills | Status: DC

## 2019-01-24 ENCOUNTER — Other Ambulatory Visit: Payer: Self-pay

## 2019-01-24 ENCOUNTER — Ambulatory Visit (INDEPENDENT_AMBULATORY_CARE_PROVIDER_SITE_OTHER): Payer: Medicare Other

## 2019-01-24 DIAGNOSIS — E538 Deficiency of other specified B group vitamins: Secondary | ICD-10-CM | POA: Diagnosis not present

## 2019-01-24 DIAGNOSIS — Z7901 Long term (current) use of anticoagulants: Secondary | ICD-10-CM | POA: Diagnosis not present

## 2019-01-24 LAB — POCT INR: INR: 1.1 — AB (ref 2.0–3.0)

## 2019-01-24 NOTE — Progress Notes (Signed)
Pt INR 1.1 and PT 13.2 as per adam take coumadin 5 mg tuesday ,thurs and saturday and take sun,mon,wed and Friday take 2.5 mg coumadin and follow  Up in 1 week

## 2019-01-30 ENCOUNTER — Other Ambulatory Visit: Payer: Self-pay | Admitting: Adult Health

## 2019-01-30 DIAGNOSIS — M542 Cervicalgia: Secondary | ICD-10-CM

## 2019-01-31 ENCOUNTER — Ambulatory Visit (INDEPENDENT_AMBULATORY_CARE_PROVIDER_SITE_OTHER): Payer: Medicare Other

## 2019-01-31 ENCOUNTER — Other Ambulatory Visit: Payer: Self-pay

## 2019-01-31 DIAGNOSIS — Z7901 Long term (current) use of anticoagulants: Secondary | ICD-10-CM

## 2019-01-31 LAB — POCT INR: INR: 1.1 — AB (ref 2.0–3.0)

## 2019-01-31 MED ORDER — WARFARIN SODIUM 5 MG PO TABS: ORAL_TABLET | ORAL | 3 refills | Status: DC

## 2019-01-31 NOTE — Progress Notes (Signed)
Pt INR 1.1 and PT 13.5 as per heather take coumadin 5 mg mon,tues,thurs,sat,sunday and wed and Friday  Take 1/2 tab and check in 1 week

## 2019-02-04 ENCOUNTER — Other Ambulatory Visit: Payer: Self-pay

## 2019-02-04 MED ORDER — ALBUTEROL SULFATE HFA 108 (90 BASE) MCG/ACT IN AERS: INHALATION_SPRAY | RESPIRATORY_TRACT | 3 refills | Status: DC

## 2019-02-07 ENCOUNTER — Ambulatory Visit: Payer: Medicare Other

## 2019-02-10 ENCOUNTER — Other Ambulatory Visit: Payer: Self-pay | Admitting: Adult Health

## 2019-02-10 ENCOUNTER — Other Ambulatory Visit: Payer: Self-pay | Admitting: Internal Medicine

## 2019-02-13 ENCOUNTER — Ambulatory Visit (INDEPENDENT_AMBULATORY_CARE_PROVIDER_SITE_OTHER): Payer: Medicare Other

## 2019-02-13 ENCOUNTER — Other Ambulatory Visit: Payer: Self-pay

## 2019-02-13 DIAGNOSIS — Z7901 Long term (current) use of anticoagulants: Secondary | ICD-10-CM

## 2019-02-13 LAB — POCT INR: INR: 1.4 — AB (ref 2.0–3.0)

## 2019-02-13 NOTE — Progress Notes (Signed)
Pt inr check , pt will take 5mg  coumadin daily and recheck in one week, follow up with adam and dr Humphrey Rolls

## 2019-02-20 ENCOUNTER — Ambulatory Visit: Payer: Medicare Other

## 2019-02-21 ENCOUNTER — Other Ambulatory Visit: Payer: Self-pay

## 2019-02-21 ENCOUNTER — Encounter: Payer: Self-pay | Admitting: Internal Medicine

## 2019-02-21 ENCOUNTER — Ambulatory Visit (INDEPENDENT_AMBULATORY_CARE_PROVIDER_SITE_OTHER): Payer: Medicare Other | Admitting: Internal Medicine

## 2019-02-21 ENCOUNTER — Ambulatory Visit (INDEPENDENT_AMBULATORY_CARE_PROVIDER_SITE_OTHER): Payer: Medicare Other

## 2019-02-21 VITALS — BP 128/70 | HR 71 | Temp 97.8°F | Resp 16 | Ht 66.0 in | Wt 248.0 lb

## 2019-02-21 DIAGNOSIS — G5603 Carpal tunnel syndrome, bilateral upper limbs: Secondary | ICD-10-CM

## 2019-02-21 DIAGNOSIS — J449 Chronic obstructive pulmonary disease, unspecified: Secondary | ICD-10-CM

## 2019-02-21 DIAGNOSIS — I10 Essential (primary) hypertension: Secondary | ICD-10-CM

## 2019-02-21 DIAGNOSIS — E782 Mixed hyperlipidemia: Secondary | ICD-10-CM

## 2019-02-21 DIAGNOSIS — R0602 Shortness of breath: Secondary | ICD-10-CM | POA: Diagnosis not present

## 2019-02-21 DIAGNOSIS — I82721 Chronic embolism and thrombosis of deep veins of right upper extremity: Secondary | ICD-10-CM

## 2019-02-21 DIAGNOSIS — Z7901 Long term (current) use of anticoagulants: Secondary | ICD-10-CM | POA: Diagnosis not present

## 2019-02-21 LAB — POCT INR: INR: 1.7 — AB (ref 2.0–3.0)

## 2019-02-21 MED ORDER — WARFARIN SODIUM 1 MG PO TABS: ORAL_TABLET | ORAL | 6 refills | Status: DC

## 2019-02-21 NOTE — Progress Notes (Signed)
PT/INR WAS 20.4/ 1.7 ,pt was seen by dr Humphrey Rolls and advised

## 2019-02-21 NOTE — Progress Notes (Signed)
[facility] [place], [place] [address]  Internal MEDICINE  Office Visit Note  Patient Name: Brenda Santana  [id number]  [id number]  Date of Service: 03/22/2019  Chief Complaint  Patient presents with  . COPD  . Medical Management of Chronic Issues    Ct chest and PFT    HPI Pt is seen for multiple problems, She is here for pulmonary follow up however appointment is changed to medical follow up due to multiple complaints. 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 C/O bilateral hands pain and numbness ?? Carpal tunnel. She was suppose to see neurology    DM is under better control. She had complicated course for perforated acute appendicitis last year. She sees pain management. Pt was seeing Rheumatology for RA as well   Current Medication: Outpatient Encounter Medications as of 02/21/2019  Medication Sig Note  . acyclovir (ZOVIRAX) 400 MG tablet Take 1 tablet (400 mg total) by mouth 2 (two) times daily.   Marland Kitchen acyclovir ointment (ZOVIRAX) 5 % APPLY TOPICALLY EVERY 3 (THREE) HOURS.   Marland Kitchen albuterol (PROAIR HFA) 108 (90 Base) MCG/ACT inhaler INHALE 2 PUFFS INTO THE LUNGS EVERY 4 (FOUR) HOURS AS NEEDED FOR WHEEZING OR SHORTNESS OF BREATH.   Marland Kitchen allopurinol (ZYLOPRIM) 300 MG tablet TAKE ONE TAB AT NIGHT FOR GOUT   . aspirin EC 81 MG tablet Take 81 mg by mouth daily.   . BD PEN NEEDLE NANO U/F 32G X 4 MM MISC USE DAILY WITH VICTOZA   . benazepril (LOTENSIN) 10 MG tablet Take 1 tablet (10 mg total) by mouth daily.   . Biotin 5 MG CAPS Take 5 mg by mouth daily.    . Canagliflozin-metFORMIN HCl (INVOKAMET) 50-500 MG TABS Take 1 tablet by mouth 2 (two) times daily.   . cetirizine (ZYRTEC ALLERGY) 10 MG tablet Take 10 mg by mouth daily as needed for allergies.    . chlorthalidone (HYGROTON) 25 MG tablet TAKE 1 TABLET BY MOUTH EVERY DAY IN THE MORNING   . clobetasol cream (TEMOVATE) 2.77 %  Apply 1 application topically 2 (two) times daily as needed (for eczema on hands).    . diclofenac sodium (VOLTAREN) 1 % GEL APPLY 4 G TOPICALLY FOUR (4) TIMES A DAY.   Marland Kitchen docusate sodium (COLACE) 100 MG capsule Take 100 mg by mouth daily.    Marland Kitchen doxycycline (VIBRA-TABS) 100 MG tablet Take 100 mg by mouth 2 (two) times daily.  01/31/2018: Continuous  . famotidine (PEPCID) 20 MG tablet Take 20 mg by mouth daily as needed for heartburn or indigestion.    . Ferrous Sulfate (IRON) 325 (65 Fe) MG TABS Take 325 mg by mouth 3 (three) times daily.  01/31/2018: Stopped for procedure  . Fluticasone-Salmeterol (ADVAIR DISKUS) 500-50 MCG/DOSE AEPB Inhale 1 puff into the lungs 2 (two) times daily as needed (for shortness of breath or wheezing).    . gabapentin (NEURONTIN) 800 MG tablet Take 800 mg by mouth 3 (three) times daily as needed (for pain).    Marland Kitchen ipratropium-albuterol (DUONEB) 0.5-2.5 (3) MG/3ML SOLN Take 3 mLs by nebulization every 6 (six) hours as needed (for shortness of breath or wheezing).    Marland Kitchen lidocaine (XYLOCAINE) 5 % ointment APPLY A SMALL AMOUNT TO AFFECTED AREA 2-3 TIMES A DAY   . lisinopril (ZESTRIL) 10 MG tablet TAKE 1 TAB DAILY IN MORNING (Patient taking differently: Change in therapy due to backorder  on lisinopril)   . metoprolol tartrate (LOPRESSOR) 50 MG tablet Take 1 tablet (50 mg total) by mouth 2 (two) times daily.   . montelukast (SINGULAIR) 10 MG tablet TAKE 1 TABLET BY MOUTH DAILY FOR ASTHMA   . oxymetazoline (AFRIN) 0.05 % nasal spray Place 1 spray into both nostrils 2 (two) times daily as needed for congestion.   . phentermine (ADIPEX-P) 37.5 MG tablet Take 1 tablet (37.5 mg total) by mouth daily before breakfast.   . SYMBICORT 160-4.5 MCG/ACT inhaler INHALE 2 PUFF TWICE A DAY   . tiZANidine (ZANAFLEX) 4 MG tablet TAKE 1 TABLET (4 MG TOTAL) BY MOUTH 3 (THREE) TIMES DAILY.   . traMADol (ULTRAM) 50 MG tablet Take 1 tablet (50 mg total) by mouth every 8 (eight) hours as needed for  moderate pain.   Marland Kitchen tretinoin (RETIN-A) 0.025 % cream Apply 1 application topically at bedtime as needed (for eczema on face).    Marland Kitchen umeclidinium bromide (INCRUSE ELLIPTA) 62.5 MCG/INH AEPB Take one puff every day   . VICTOZA 18 MG/3ML SOPN INJECT 1.8 MG INTO THE SKIN EVERY MORNING   . warfarin (COUMADIN) 5 MG tablet Take a whole tablet on Monday, Tuesday, Thursday, Saturday and Sunday.  Take a 1/2 tablet on Wednesday and Friday.   . [DISCONTINUED] Ipratropium-Albuterol (COMBIVENT RESPIMAT) 20-100 MCG/ACT AERS respimat Inhale 1 puff into the lungs every 6 (six) hours.   . [DISCONTINUED] linaclotide (LINZESS) 145 MCG CAPS capsule TAKE 1 CAPSULE (145 MCG TOTAL) BY MOUTH DAILY BEFORE BREAKFAST.   . [DISCONTINUED] predniSONE (DELTASONE) 5 MG tablet TAKE 2 TABLETS (10 MG TOTAL) BY MOUTH DAILY WITH BREAKFAST. (Patient not taking: Reported on 03/01/2019)   . warfarin (COUMADIN) 1 MG tablet Take one tab as needed with coumadin 5 mg qd    No facility-administered encounter medications on file as of 02/21/2019.     Surgical History: Past Surgical History:  Procedure Laterality Date  . APPENDECTOMY    . COLONOSCOPY WITH PROPOFOL N/A 02/02/2018   Procedure: COLONOSCOPY WITH PROPOFOL;  Surgeon: Jonathon Bellows, MD;  Location: [facility] ENDOSCOPY;  Service: Gastroenterology;  Laterality: N/A;  . LAPAROSCOPIC APPENDECTOMY N/A 02/05/2018   Procedure: APPENDECTOMY LAPAROSCOPIC;  Surgeon: Jules Husbands, MD;  Location: [facility] ORS;  Service: General;  Laterality: N/A;  . right arm surgery    . TUBAL LIGATION      Medical History: Past Medical History:  Diagnosis Date  . Asthma   . Atopic dermatitis   . Constipation   . COPD (chronic obstructive pulmonary disease) ([place])   . Diabetes mellitus without complication ([place])   . DVT (deep venous thrombosis) ([place])   . GERD (gastroesophageal reflux disease)   . Hyperlipidemia   . Hypertension   . Rheumatoid arthritis ([place])     Family History: Family History  Problem  Relation Age of Onset  . Breast cancer Mother   . Hypertension Mother   . Diabetes Mother   . Hypertension Father   . Heart failure Father     Social History   Socioeconomic History  . Marital status: Single    Spouse name: Not on file  . Number of children: Not on file  . Years of education: Not on file  . Highest education level: Not on file  Occupational History  . Not on file  Social Needs  . Financial resource strain: Not on file  . Food insecurity    Worry: Not on file    Inability: Not on file  . Transportation  needs    Medical: Not on file    Non-medical: Not on file  Tobacco Use  . Smoking status: Former Research scientist (life sciences)  . Smokeless tobacco: Never Used  Substance and Sexual Activity  . Alcohol use: No  . Drug use: No  . Sexual activity: Yes  Lifestyle  . Physical activity    Days per week: Not on file    Minutes per session: Not on file  . Stress: Not on file  Relationships  . Social Herbalist on phone: Not on file    Gets together: Not on file    Attends religious service: Not on file    Active member of club or organization: Not on file    Attends meetings of clubs or organizations: Not on file    Relationship status: Not on file  . Intimate partner violence    Fear of current or ex partner: Not on file    Emotionally abused: Not on file    Physically abused: Not on file    Forced sexual activity: Not on file  Other Topics Concern  . Not on file  Social History Narrative  . Not on file      Review of Systems  Constitutional: Negative for chills, diaphoresis and fatigue.  HENT: Negative for ear pain, postnasal drip and sinus pressure.   Eyes: Negative for photophobia, discharge, redness, itching and visual disturbance.  Respiratory: Positive for apnea and shortness of breath. Negative for cough and wheezing.   Cardiovascular: Negative for chest pain, palpitations and leg swelling.  Gastrointestinal: Negative for abdominal pain,  constipation, diarrhea, nausea and vomiting.  Genitourinary: Negative for dysuria and flank pain.  Musculoskeletal: Positive for arthralgias and back pain. Negative for gait problem and neck pain.  Skin: Negative for color change.  Allergic/Immunologic: Negative for environmental allergies and food allergies.  Neurological: Negative for dizziness and headaches.  Hematological: Does not bruise/bleed easily.  Psychiatric/Behavioral: Negative for agitation, behavioral problems (depression) and hallucinations.    Vital Signs: BP 128/70   Pulse 71   Temp 97.8 F (36.6 C)   Resp 16   Ht _0  (1.676 m)   Wt 248 lb (112.5 kg)   SpO2 96%   BMI 40.03 kg/m    Physical Exam Constitutional:      Appearance: Normal appearance. She is obese.  HENT:     Head: Normocephalic and atraumatic.  Neck:     Musculoskeletal: Neck rigidity present.  Cardiovascular:     Rate and Rhythm: Normal rate and regular rhythm.     Pulses: Normal pulses.  Pulmonary:     Breath sounds: Rhonchi present.  Musculoskeletal:        General: Swelling present.     Right lower leg: Edema present.     Left lower leg: Edema present.     Comments: PIP Soft tissue swelling   Skin:    General: Skin is warm and dry.  Neurological:     Mental Status: She is alert.     Assessment/Plan: 1. Shortness of breath - Pt needs further work up, she will need to see pulmonary as well. She does not use CPAP which is a main concern  - Spirometry with Graph - Sed Rate (ESR) - Rheumatoid Factor - ECHOCARDIOGRAM COMPLETE; Future  2. Mixed hyperlipidemia - Check fasting lipid profile, continue to watch, low fat, low cholesterol diet   3. Essential (primary) hypertension - Controlled with meds   4. Chronic obstructive pulmonary disease, unspecified  COPD type ([place]) - Pt is on multiple MDI, she is not sure which one she is on since she gets samples as well. Will check with pharmacy   5. Bilateral carpal tunnel syndrome -  Follow up Neurology  - Sed Rate (ESR) - Rheumatoid Factor - Fe+TIBC+Fer - ANA w/Reflex if Positive; Future - TSH; Future - T4, free; Future   6. 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 Will continue   General Counseling: Masie verbalizes understanding of the findings of todays visit and agrees with plan of treatment. I have discussed any further diagnostic evaluation that may be needed or ordered today. We also reviewed her medications today. she has been encouraged to call the office with any questions or concerns that should arise related to todays visit.   Orders Placed This Encounter  Procedures  . Sed Rate (ESR)  . Rheumatoid Factor  . Fe+TIBC+Fer  . ANA w/Reflex if Positive  . CBC with Differential/Platelet  . Lipid Panel With LDL/HDL Ratio  . TSH  . T4, free  . Comprehensive metabolic panel  . ECHOCARDIOGRAM COMPLETE  . Spirometry with Graph    Meds ordered this encounter  Medications  . warfarin (COUMADIN) 1 MG tablet    Sig: Take one tab as needed with coumadin 5 mg qd    Dispense:  30 tablet    Refill:  6    Time spent:25 Minutes   Dr Lavera Guise Internal medicine

## 2019-02-25 ENCOUNTER — Other Ambulatory Visit: Payer: Self-pay

## 2019-02-25 ENCOUNTER — Ambulatory Visit (INDEPENDENT_AMBULATORY_CARE_PROVIDER_SITE_OTHER): Payer: Medicare Other | Admitting: Nurse Practitioner

## 2019-02-25 ENCOUNTER — Encounter: Payer: Self-pay | Admitting: Nurse Practitioner

## 2019-02-25 VITALS — BP 110/63 | HR 91 | Temp 97.2°F | Resp 16 | Ht 66.0 in | Wt 244.0 lb

## 2019-02-25 DIAGNOSIS — L02411 Cutaneous abscess of right axilla: Secondary | ICD-10-CM

## 2019-02-25 MED ORDER — SULFAMETHOXAZOLE-TRIMETHOPRIM 800-160 MG PO TABS: 1.0000 | ORAL_TABLET | Freq: Two times a day (BID) | ORAL | 0 refills | Status: DC

## 2019-02-25 NOTE — Progress Notes (Signed)
[facility] [place], [place] [address]  Internal MEDICINE  Office Visit Note  Patient Name: Brenda Santana  [id number]  EI:[id number]  Date of Service: 03/02/2019  Chief Complaint  Patient presents with  . Recurrent Skin Infections    under right arm, use hot cloth to press it down and feels like it has spread      Pt is here for a sick visit. Patient here today for boil under right arm in axilla region. Patient has an extensive history of boils that require lancing. Patient in obvious discomfort, complaining of pain, irritation, redness and warmth of affected area. Denies fevers or chills. Currently on prophylaxis doxycycline for boils and skin infections. Verbal consent obtained for incision and drainage.    Procedure: Area cleansed with CHG skin cleanser followed by alcohol swabs. Injected surrounding tissue with 4cc 1% lidocaine, waited 3-4 minutes for lidocaine to start absorbing. 1cm incision made into upper mid-axilla area. Drainage consisted of brown/tan discharge, malodorous and hardened tissue. Second incision made medial to first, 1/2cm incision made, further tan drainage from this incision. Culture swab was used to obtain culture of drainage. Area was cleansed again with CHG cleanser, 4X4 placed on area to absorb further drainage, wrapped in kerlix. Instructions were given to patient to buy CHG cleanser and wash affected area 2X daily, keep area clean at all times, place 4X4 for drainage and wrap arm to keep in place. Instructed patient on complications such as further infection and to call with symptoms of a fever, increased pain, increased drainage, redness or warmth.     Current Medication:  Outpatient Encounter Medications as of 02/25/2019  Medication Sig Note  . acyclovir (ZOVIRAX) 400 MG tablet Take 1 tablet (400 mg total) by mouth 2 (two) times daily.   Marland Kitchen acyclovir ointment (ZOVIRAX) 5 % APPLY TOPICALLY EVERY 3 (THREE) HOURS.   Marland Kitchen albuterol  (PROAIR HFA) 108 (90 Base) MCG/ACT inhaler INHALE 2 PUFFS INTO THE LUNGS EVERY 4 (FOUR) HOURS AS NEEDED FOR WHEEZING OR SHORTNESS OF BREATH.   Marland Kitchen allopurinol (ZYLOPRIM) 300 MG tablet TAKE ONE TAB AT NIGHT FOR GOUT   . aspirin EC 81 MG tablet Take 81 mg by mouth daily.   . BD PEN NEEDLE NANO U/F 32G X 4 MM MISC USE DAILY WITH VICTOZA   . benazepril (LOTENSIN) 10 MG tablet Take 1 tablet (10 mg total) by mouth daily.   . Biotin 5 MG CAPS Take 5 mg by mouth daily.    . Canagliflozin-metFORMIN HCl (INVOKAMET) 50-500 MG TABS Take 1 tablet by mouth 2 (two) times daily.   . cetirizine (ZYRTEC ALLERGY) 10 MG tablet Take 10 mg by mouth daily as needed for allergies.    . chlorthalidone (HYGROTON) 25 MG tablet TAKE 1 TABLET BY MOUTH EVERY DAY IN THE MORNING   . clobetasol cream (TEMOVATE) [id number] % Apply 1 application topically 2 (two) times daily as needed (for eczema on hands).    . diclofenac sodium (VOLTAREN) 1 % GEL APPLY 4 G TOPICALLY FOUR (4) TIMES A DAY.   Marland Kitchen docusate sodium (COLACE) 100 MG capsule Take 100 mg by mouth daily.    Marland Kitchen doxycycline (VIBRA-TABS) 100 MG tablet Take 100 mg by mouth 2 (two) times daily.  01/31/2018: Continuous  . famotidine (PEPCID) 20 MG tablet Take 20 mg by mouth daily as needed for heartburn or indigestion.    . Ferrous Sulfate (IRON) 325 (65 Fe) MG TABS Take 325 mg by mouth 3 (three)  times daily.  01/31/2018: Stopped for procedure  . Fluticasone-Salmeterol (ADVAIR DISKUS) 500-50 MCG/DOSE AEPB Inhale 1 puff into the lungs 2 (two) times daily as needed (for shortness of breath or wheezing).    . gabapentin (NEURONTIN) 800 MG tablet Take 800 mg by mouth 3 (three) times daily as needed (for pain).    Marland Kitchen ipratropium-albuterol (DUONEB) 0.5-2.5 (3) MG/3ML SOLN Take 3 mLs by nebulization every 6 (six) hours as needed (for shortness of breath or wheezing).    Marland Kitchen lidocaine (XYLOCAINE) 5 % ointment APPLY A SMALL AMOUNT TO AFFECTED AREA 2-3 TIMES A DAY   . lisinopril (ZESTRIL) 10 MG tablet  TAKE 1 TAB DAILY IN MORNING (Patient taking differently: Change in therapy due to backorder on lisinopril)   . metoprolol tartrate (LOPRESSOR) 50 MG tablet Take 1 tablet (50 mg total) by mouth 2 (two) times daily.   . montelukast (SINGULAIR) 10 MG tablet TAKE 1 TABLET BY MOUTH DAILY FOR ASTHMA   . oxymetazoline (AFRIN) 0.05 % nasal spray Place 1 spray into both nostrils 2 (two) times daily as needed for congestion.   . phentermine (ADIPEX-P) 37.5 MG tablet Take 1 tablet (37.5 mg total) by mouth daily before breakfast.   . SYMBICORT 160-4.5 MCG/ACT inhaler INHALE 2 PUFF TWICE A DAY   . tiZANidine (ZANAFLEX) 4 MG tablet TAKE 1 TABLET (4 MG TOTAL) BY MOUTH 3 (THREE) TIMES DAILY.   . traMADol (ULTRAM) 50 MG tablet Take 1 tablet (50 mg total) by mouth every 8 (eight) hours as needed for moderate pain.   Marland Kitchen tretinoin (RETIN-A) 0.025 % cream Apply 1 application topically at bedtime as needed (for eczema on face).    Marland Kitchen umeclidinium bromide (INCRUSE ELLIPTA) 62.5 MCG/INH AEPB Take one puff every day   . VICTOZA 18 MG/3ML SOPN INJECT 1.8 MG INTO THE SKIN EVERY MORNING   . warfarin (COUMADIN) 1 MG tablet Take one tab as needed with coumadin 5 mg qd   . warfarin (COUMADIN) 5 MG tablet Take a whole tablet on Monday, Tuesday, Thursday, Saturday and Sunday.  Take a 1/2 tablet on Wednesday and Friday.   . [DISCONTINUED] Ipratropium-Albuterol (COMBIVENT RESPIMAT) 20-100 MCG/ACT AERS respimat Inhale 1 puff into the lungs every 6 (six) hours.   . [DISCONTINUED] linaclotide (LINZESS) 145 MCG CAPS capsule TAKE 1 CAPSULE (145 MCG TOTAL) BY MOUTH DAILY BEFORE BREAKFAST.   . [DISCONTINUED] predniSONE (DELTASONE) 5 MG tablet TAKE 2 TABLETS (10 MG TOTAL) BY MOUTH DAILY WITH BREAKFAST. (Patient not taking: Reported on 03/01/2019)   . sulfamethoxazole-trimethoprim (BACTRIM DS) 800-160 MG tablet Take 1 tablet by mouth 2 (two) times daily.    No facility-administered encounter medications on file as of 02/25/2019.        Medical History: Past Medical History:  Diagnosis Date  . Asthma   . Atopic dermatitis   . Constipation   . COPD (chronic obstructive pulmonary disease) ([place])   . Diabetes mellitus without complication ([place])   . DVT (deep venous thrombosis) ([place])   . GERD (gastroesophageal reflux disease)   . Hyperlipidemia   . Hypertension   . Rheumatoid arthritis ([place])      Today's Vitals   02/25/19 1401  BP: 110/63  Pulse: 91  Resp: 16  Temp: (!) 97.2 F (36.2 C)  Weight: 244 lb (110.7 kg)   Body mass index is 39.38 kg/m.  Review of Systems  Constitutional: Positive for activity change and fatigue. Negative for fever.  HENT: Negative for congestion, postnasal drip, rhinorrhea and sinus pressure.  Respiratory: Negative for chest tightness, shortness of breath and wheezing.   Cardiovascular: Negative for chest pain.  Gastrointestinal: Negative for constipation, diarrhea, nausea and vomiting.  Skin:       Boil to right underarm/arm pit area  Allergic/Immunologic: Negative for environmental allergies.  Neurological: Negative for dizziness and headaches.  Hematological: Positive for adenopathy.  Psychiatric/Behavioral: The patient is nervous/anxious.     Physical Exam Vitals signs and nursing note reviewed.  Constitutional:      General: She is in acute distress.     Appearance: She is well-developed. She is obese.  HENT:     Head: Normocephalic.     Nose: Nose normal.  Eyes:     Pupils: Pupils are equal, round, and reactive to light.  Neck:     Musculoskeletal: Normal range of motion and neck supple.  Cardiovascular:     Rate and Rhythm: Normal rate and regular rhythm.     Heart sounds: Normal heart sounds.  Pulmonary:     Effort: Pulmonary effort is normal.     Breath sounds: Normal breath sounds.  Abdominal:     Palpations: Abdomen is soft.     Tenderness: There is no abdominal tenderness.  Musculoskeletal: Normal range of motion.  Skin:    General: Skin is  warm and dry.     Findings: Erythema present.     Comments: Boil under right arm, upper, mid-axilla region. Surrounding skin warm, red and tender. See procedure area of note for I/D.  Neurological:     Mental Status: She is alert and oriented to person, place, and time.  Psychiatric:        Attention and Perception: Attention and perception normal.        Mood and Affect: Affect normal. Mood is anxious.        Speech: Speech normal.        Behavior: Behavior normal. Behavior is cooperative.        Thought Content: Thought content normal.        Cognition and Memory: Cognition and memory normal.        Judgment: Judgment normal.    Assessment/Plan: 1. Abscess of axilla, right Incision and drainage performed, see above. Start bactrim DS bid for 14 days. Keep wound clean and dry. Will follow up later this week for follow up. Check INR as bactrim can interfere with warfarin levels.  - sulfamethoxazole-trimethoprim (BACTRIM DS) 800-160 MG tablet; Take 1 tablet by mouth 2 (two) times daily.  Dispense: 28 tablet; Refill: 0 - Anaerobic and Aerobic Culture   General Counseling: Penelopi verbalizes understanding of the findings of todays visit and agrees with plan of treatment. I have discussed any further diagnostic evaluation that may be needed or ordered today. We also reviewed her medications today. she has been encouraged to call the office with any questions or concerns that should arise related to todays visit.    Counseling:  This patient was seen by Leretha Pol FNP Collaboration with Dr Lavera Guise as a part of collaborative care agreement  Orders Placed This Encounter  Procedures  . Anaerobic and Aerobic Culture    Meds ordered this encounter  Medications  . sulfamethoxazole-trimethoprim (BACTRIM DS) 800-160 MG tablet    Sig: Take 1 tablet by mouth 2 (two) times daily.    Dispense:  28 tablet    Refill:  0    Patient to hold doxycycline while taking bactrim. Will check INR  on Friday, 03/01/2019.    Order  Specific Question:   Supervising Provider    Answer:   Lavera Guise [id number]    Time spent: 30 Minutes

## 2019-02-27 ENCOUNTER — Other Ambulatory Visit: Payer: Self-pay | Admitting: Adult Health

## 2019-02-27 NOTE — Progress Notes (Signed)
Waiting on culture results.

## 2019-02-28 ENCOUNTER — Ambulatory Visit: Payer: Medicare Other | Admitting: Adult Health

## 2019-02-28 ENCOUNTER — Ambulatory Visit: Payer: Medicare Other

## 2019-03-01 ENCOUNTER — Ambulatory Visit (INDEPENDENT_AMBULATORY_CARE_PROVIDER_SITE_OTHER): Payer: Medicare Other | Admitting: Nurse Practitioner

## 2019-03-01 ENCOUNTER — Other Ambulatory Visit: Payer: Self-pay | Admitting: Adult Health

## 2019-03-01 ENCOUNTER — Other Ambulatory Visit: Payer: Self-pay

## 2019-03-01 ENCOUNTER — Encounter: Payer: Self-pay | Admitting: Nurse Practitioner

## 2019-03-01 VITALS — BP 111/54 | HR 73 | Temp 98.0°F | Resp 16 | Ht 66.0 in | Wt 243.6 lb

## 2019-03-01 DIAGNOSIS — L02411 Cutaneous abscess of right axilla: Secondary | ICD-10-CM | POA: Insufficient documentation

## 2019-03-01 DIAGNOSIS — J449 Chronic obstructive pulmonary disease, unspecified: Secondary | ICD-10-CM

## 2019-03-01 DIAGNOSIS — Z7901 Long term (current) use of anticoagulants: Secondary | ICD-10-CM

## 2019-03-01 DIAGNOSIS — Z7952 Long term (current) use of systemic steroids: Secondary | ICD-10-CM

## 2019-03-01 DIAGNOSIS — E1165 Type 2 diabetes mellitus with hyperglycemia: Secondary | ICD-10-CM | POA: Diagnosis not present

## 2019-03-01 DIAGNOSIS — M79621 Pain in right upper arm: Secondary | ICD-10-CM

## 2019-03-01 LAB — ANAEROBIC AND AEROBIC CULTURE

## 2019-03-01 LAB — POCT GLYCOSYLATED HEMOGLOBIN (HGB A1C): Hemoglobin A1C: 6.8 % — AB (ref 4.0–5.6)

## 2019-03-01 LAB — POCT INR: INR: 2 (ref 2.0–3.0)

## 2019-03-01 MED ORDER — PREDNISONE 5 MG PO TABS: 10.0000 mg | ORAL_TABLET | Freq: Every day | ORAL | 1 refills | Status: DC

## 2019-03-01 MED ORDER — OXYCODONE-ACETAMINOPHEN 5-325 MG PO TABS: 1.0000 | ORAL_TABLET | Freq: Four times a day (QID) | ORAL | 0 refills | Status: DC | PRN

## 2019-03-01 NOTE — Progress Notes (Signed)
[facility] [place], [place] [address]  Internal MEDICINE  Office Visit Note  Patient Name: Brenda Santana  [id number]  EI:[id number]  Date of Service: 03/01/2019  Chief Complaint  Patient presents with  . Follow-up    wound check, still giving a little issue, still hurts and burns, spot or arm thats sore  . Diabetes  . Hypertension  . Hyperlipidemia    Patient here today for boil under right arm in axilla region. Was incised and drained on Monday, 02/27/2019. Continues to be very tender to touch, however, this has improved a little since her visit on Monday. She reports the pain as 7-8/10 in severity. . The effected area is smaller in size, but there appears to be second abscess formation adjacent to abscess which was previously incised and drained.  Patient has an extensive history of boils that require lancing.  Denies fevers or chills. Currently on prophylaxis doxycycline for boils and skin infections. Verbal consent obtained for incision and drainage. Blood sugars are doing very well. Her HgbA1c is 6.8 today. She reports no issues with diabetes.  Her INR is 2.0 today. Will continue with original dosing of warfarin and will recheck in one week due to antibiotic therapy.       Current Medication: Outpatient Encounter Medications as of 03/01/2019  Medication Sig Note  . acyclovir (ZOVIRAX) 400 MG tablet Take 1 tablet (400 mg total) by mouth 2 (two) times daily.   Marland Kitchen acyclovir ointment (ZOVIRAX) 5 % APPLY TOPICALLY EVERY 3 (THREE) HOURS.   Marland Kitchen albuterol (PROAIR HFA) 108 (90 Base) MCG/ACT inhaler INHALE 2 PUFFS INTO THE LUNGS EVERY 4 (FOUR) HOURS AS NEEDED FOR WHEEZING OR SHORTNESS OF BREATH.   Marland Kitchen allopurinol (ZYLOPRIM) 300 MG tablet TAKE ONE TAB AT NIGHT FOR GOUT   . aspirin EC 81 MG tablet Take 81 mg by mouth daily.   . BD PEN NEEDLE NANO U/F 32G X 4 MM MISC USE DAILY WITH VICTOZA   . benazepril (LOTENSIN) 10 MG tablet Take 1 tablet (10 mg total) by mouth  daily.   . Biotin 5 MG CAPS Take 5 mg by mouth daily.    . Canagliflozin-metFORMIN HCl (INVOKAMET) 50-500 MG TABS Take 1 tablet by mouth 2 (two) times daily.   . cetirizine (ZYRTEC ALLERGY) 10 MG tablet Take 10 mg by mouth daily as needed for allergies.    . chlorthalidone (HYGROTON) 25 MG tablet TAKE 1 TABLET BY MOUTH EVERY DAY IN THE MORNING   . clobetasol cream (TEMOVATE) [id number] % Apply 1 application topically 2 (two) times daily as needed (for eczema on hands).    . diclofenac sodium (VOLTAREN) 1 % GEL APPLY 4 G TOPICALLY FOUR (4) TIMES A DAY.   Marland Kitchen docusate sodium (COLACE) 100 MG capsule Take 100 mg by mouth daily.    Marland Kitchen doxycycline (VIBRA-TABS) 100 MG tablet Take 100 mg by mouth 2 (two) times daily.  01/31/2018: Continuous  . famotidine (PEPCID) 20 MG tablet Take 20 mg by mouth daily as needed for heartburn or indigestion.    . Ferrous Sulfate (IRON) 325 (65 Fe) MG TABS Take 325 mg by mouth 3 (three) times daily.  01/31/2018: Stopped for procedure  . Fluticasone-Salmeterol (ADVAIR DISKUS) 500-50 MCG/DOSE AEPB Inhale 1 puff into the lungs 2 (two) times daily as needed (for shortness of breath or wheezing).    . gabapentin (NEURONTIN) 800 MG tablet Take 800 mg by mouth 3 (three) times daily as needed (for pain).    Marland Kitchen  Ipratropium-Albuterol (COMBIVENT RESPIMAT) 20-100 MCG/ACT AERS respimat Inhale 1 puff into the lungs every 6 (six) hours.   Marland Kitchen ipratropium-albuterol (DUONEB) 0.5-2.5 (3) MG/3ML SOLN Take 3 mLs by nebulization every 6 (six) hours as needed (for shortness of breath or wheezing).    Marland Kitchen lidocaine (XYLOCAINE) 5 % ointment APPLY A SMALL AMOUNT TO AFFECTED AREA 2-3 TIMES A DAY   . LINZESS 145 MCG CAPS capsule TAKE 1 CAPSULE (145 MCG TOTAL) BY MOUTH DAILY BEFORE BREAKFAST.   Marland Kitchen lisinopril (ZESTRIL) 10 MG tablet TAKE 1 TAB DAILY IN MORNING (Patient taking differently: Change in therapy due to backorder on lisinopril)   . metoprolol tartrate (LOPRESSOR) 50 MG tablet Take 1 tablet (50 mg total) by  mouth 2 (two) times daily.   . montelukast (SINGULAIR) 10 MG tablet TAKE 1 TABLET BY MOUTH DAILY FOR ASTHMA   . oxymetazoline (AFRIN) 0.05 % nasal spray Place 1 spray into both nostrils 2 (two) times daily as needed for congestion.   . phentermine (ADIPEX-P) 37.5 MG tablet Take 1 tablet (37.5 mg total) by mouth daily before breakfast.   . sulfamethoxazole-trimethoprim (BACTRIM DS) 800-160 MG tablet Take 1 tablet by mouth 2 (two) times daily.   . SYMBICORT 160-4.5 MCG/ACT inhaler INHALE 2 PUFF TWICE A DAY   . tiZANidine (ZANAFLEX) 4 MG tablet TAKE 1 TABLET (4 MG TOTAL) BY MOUTH 3 (THREE) TIMES DAILY.   . traMADol (ULTRAM) 50 MG tablet Take 1 tablet (50 mg total) by mouth every 8 (eight) hours as needed for moderate pain.   Marland Kitchen tretinoin (RETIN-A) 0.025 % cream Apply 1 application topically at bedtime as needed (for eczema on face).    Marland Kitchen umeclidinium bromide (INCRUSE ELLIPTA) 62.5 MCG/INH AEPB Take one puff every day   . VICTOZA 18 MG/3ML SOPN INJECT 1.8 MG INTO THE SKIN EVERY MORNING   . warfarin (COUMADIN) 1 MG tablet Take one tab as needed with coumadin 5 mg qd   . warfarin (COUMADIN) 5 MG tablet Take a whole tablet on Monday, Tuesday, Thursday, Saturday and Sunday.  Take a 1/2 tablet on Wednesday and Friday.   Marland Kitchen oxyCODONE-acetaminophen (PERCOCET/ROXICET) 5-325 MG tablet Take 1 tablet by mouth every 6 (six) hours as needed for severe pain.   . predniSONE (DELTASONE) 5 MG tablet Take 2 tablets (10 mg total) by mouth daily with breakfast.   . [DISCONTINUED] oxyCODONE-acetaminophen (PERCOCET/ROXICET) 5-325 MG tablet Take 1 tablet by mouth every 6 (six) hours as needed for severe pain.   . [DISCONTINUED] predniSONE (DELTASONE) 5 MG tablet TAKE 2 TABLETS (10 MG TOTAL) BY MOUTH DAILY WITH BREAKFAST. (Patient not taking: Reported on 03/01/2019)    No facility-administered encounter medications on file as of 03/01/2019.     Surgical History: Past Surgical History:  Procedure Laterality Date  .  APPENDECTOMY    . COLONOSCOPY WITH PROPOFOL N/A 02/02/2018   Procedure: COLONOSCOPY WITH PROPOFOL;  Surgeon: Jonathon Bellows, MD;  Location: [facility] ENDOSCOPY;  Service: Gastroenterology;  Laterality: N/A;  . LAPAROSCOPIC APPENDECTOMY N/A 02/05/2018   Procedure: APPENDECTOMY LAPAROSCOPIC;  Surgeon: Jules Husbands, MD;  Location: [facility] ORS;  Service: General;  Laterality: N/A;  . right arm surgery    . TUBAL LIGATION      Medical History: Past Medical History:  Diagnosis Date  . Asthma   . Atopic dermatitis   . Constipation   . COPD (chronic obstructive pulmonary disease) ([place])   . Diabetes mellitus without complication ([place])   . DVT (deep venous thrombosis) ([place])   .  GERD (gastroesophageal reflux disease)   . Hyperlipidemia   . Hypertension   . Rheumatoid arthritis ([place])     Family History: Family History  Problem Relation Age of Onset  . Breast cancer Mother   . Hypertension Mother   . Diabetes Mother   . Hypertension Father   . Heart failure Father     Social History   Socioeconomic History  . Marital status: Single    Spouse name: Not on file  . Number of children: Not on file  . Years of education: Not on file  . Highest education level: Not on file  Occupational History  . Not on file  Social Needs  . Financial resource strain: Not on file  . Food insecurity    Worry: Not on file    Inability: Not on file  . Transportation needs    Medical: Not on file    Non-medical: Not on file  Tobacco Use  . Smoking status: Former Research scientist (life sciences)  . Smokeless tobacco: Never Used  Substance and Sexual Activity  . Alcohol use: No  . Drug use: No  . Sexual activity: Yes  Lifestyle  . Physical activity    Days per week: Not on file    Minutes per session: Not on file  . Stress: Not on file  Relationships  . Social Herbalist on phone: Not on file    Gets together: Not on file    Attends religious service: Not on file    Active member of club or organization: Not on  file    Attends meetings of clubs or organizations: Not on file    Relationship status: Not on file  . Intimate partner violence    Fear of current or ex partner: Not on file    Emotionally abused: Not on file    Physically abused: Not on file    Forced sexual activity: Not on file  Other Topics Concern  . Not on file  Social History Narrative  . Not on file      Review of Systems  Constitutional: Positive for activity change and fatigue.  HENT: Negative for congestion, postnasal drip, sinus pressure and sinus pain.   Respiratory: Negative for shortness of breath and wheezing.   Cardiovascular: Negative for chest pain and palpitations.  Gastrointestinal: Negative for constipation, diarrhea, nausea and vomiting.  Endocrine: Negative for cold intolerance, heat intolerance, polydipsia and polyuria.       Blood sugars doing well    Musculoskeletal: Positive for arthralgias.       Right upper arm pain  Skin:       Boil to right underarm/arm pit area. Red, tender, smaller than prior visits. Upper arm very tender.   Allergic/Immunologic: Positive for environmental allergies.  Neurological: Negative for dizziness and light-headedness.  Hematological: Positive for adenopathy.  Psychiatric/Behavioral: Negative for dysphoric mood. The patient is not nervous/anxious.     Today's Vitals   03/01/19 0837  BP: (!) 111/54  Pulse: 73  Resp: 16  Temp: 98 F (36.7 C)  SpO2: 93%  Weight: 243 lb 9.6 oz (110.5 kg)  Height: 5\' 6"  (1.676 m)   Body mass index is 39.32 kg/m.  Physical Exam Vitals signs and nursing note reviewed.  Constitutional:      General: She is in acute distress.     Appearance: She is well-developed.  HENT:     Head: Normocephalic and atraumatic.  Eyes:     Pupils: Pupils are equal, round,  and reactive to light.  Neck:     Musculoskeletal: Normal range of motion and neck supple.  Cardiovascular:     Rate and Rhythm: Normal rate and regular rhythm.     Heart  sounds: Normal heart sounds.  Pulmonary:     Effort: Pulmonary effort is normal.     Breath sounds: Normal breath sounds.  Abdominal:     Palpations: Abdomen is soft.     Tenderness: There is no abdominal tenderness.  Musculoskeletal: Normal range of motion.  Skin:    General: Skin is warm and dry.     Findings: Erythema present.     Comments: Well healing procedure scar in right upper axillary region. There is new, warm, tender abscess-like lesion adjacent to initial lesion. This is more medial than the initial lesion.  Skin intact with no drainage currently noted. No axillary lymphadenitis.   Neurological:     Mental Status: She is alert and oriented to person, place, and time.  Psychiatric:        Mood and Affect: Mood normal.        Behavior: Behavior normal.    Assessment/Plan: 1. Abscess of axilla, right There is new abscess-like lesion adjacent to initial lesion. Very tender. Overall, arm is improving. Continue antibiotics as prescribed. Keep lesion clean and dry. Dressing supplies were given to the patient. Short term prescription for oxycodone/APAP 5/325mg  given. Sent #20 tablets to her pharmacy.  - oxyCODONE-acetaminophen (PERCOCET/ROXICET) 5-325 MG tablet; Take 1 tablet by mouth every 6 (six) hours as needed for severe pain.  Dispense: 20 tablet; Refill: 0  2. Pain in right upper arm Short term prescription for oxycodone/APAP 5/325mg  given. Sent #20 tablets to her pharmacy. Reviewed risks and possible side effects associated with taking opiates, benzodiazepines and other CNS depressants. Combination of these could cause dizziness and drowsiness. Advised patient not to drive or operate machinery when taking these medications, as patient's and other's life can be at risk and will have consequences. Patient verbalized understanding in this matter. Dependence and abuse for these drugs will be monitored closely. A Controlled substance policy and procedure is on file which allows [facility] to order a urine drug screen test at any visit. Patient understands and agrees with the plan - oxyCODONE-acetaminophen (PERCOCET/ROXICET) 5-325 MG tablet; Take 1 tablet by mouth every 6 (six) hours as needed for severe pain.  Dispense: 20 tablet; Refill: 0  3. Type 2 diabetes mellitus with hyperglycemia, unspecified whether long term insulin use (HCC) - POCT HgB A1C 6.8 today. Continue diabetic medication as prescribed.   4. Chronic obstructive pulmonary disease, unspecified COPD type ([employer]) Continue inhalers and respiratory medications as prescribed. Continue prednisone 10mg  daily to prevent flares.  - predniSONE (DELTASONE) 5 MG tablet; Take 2 tablets (10 mg total) by mouth daily with breakfast.  Dispense: 60 tablet; Refill: 1  5. Encounter for current long-term use of anticoagulants - POCT INR 2.0 today. Continue warfarin at current dosing regimen. Recheck in one week.   6. Long term (current) use of systemic steroids Patient currently on prednisone 10mg  daily due to asthma/COPD. A BMD was done 2017 which showed normal bone density of the femur and osteopenia in lumbar spine. She takes daily calcium with vitamin d.   General Counseling: shayde rempfer understanding of the findings of todays visit and agrees with plan of treatment. I have discussed any further diagnostic evaluation that may be needed or ordered today. We also reviewed her medications today. she has been  encouraged to call the office with any questions or concerns that should arise related to todays visit.  Diabetes Counseling:  1. Addition of ACE inh/ ARB'S for nephroprotection. Microalbumin is updated  2. Diabetic foot care, prevention of complications. Podiatry consult 3. Exercise and lose weight.  4. Diabetic eye examination, Diabetic eye exam is updated  5. Monitor blood sugar closlely. nutrition counseling.  6. Sign and symptoms of hypoglycemia including shaking sweating,confusion and  headaches.  This patient was seen by Leretha Pol FNP Collaboration with Dr Lavera Guise as a part of collaborative care agreement  Orders Placed This Encounter  Procedures  . POCT HgB A1C  . POCT INR    Meds ordered this encounter  Medications  . predniSONE (DELTASONE) 5 MG tablet    Sig: Take 2 tablets (10 mg total) by mouth daily with breakfast.    Dispense:  60 tablet    Refill:  1    Order Specific Question:   Supervising Provider    Answer:   Lavera Guise [place]  . DISCONTD: oxyCODONE-acetaminophen (PERCOCET/ROXICET) 5-325 MG tablet    Sig: Take 1 tablet by mouth every 6 (six) hours as needed for severe pain.    Dispense:  20 tablet    Refill:  0    Order Specific Question:   Supervising Provider    Answer:   Lavera Guise [id number]  . oxyCODONE-acetaminophen (PERCOCET/ROXICET) 5-325 MG tablet    Sig: Take 1 tablet by mouth every 6 (six) hours as needed for severe pain.    Dispense:  20 tablet    Refill:  0    Order Specific Question:   Supervising Provider    Answer:   Lavera Guise [id number]    Time spent: 89 Minutes      Dr Lavera Guise Internal medicine

## 2019-03-03 NOTE — Progress Notes (Signed)
Patient treated with 14 days bactrim. First recheck looks good

## 2019-03-07 ENCOUNTER — Encounter: Payer: Self-pay | Admitting: Adult Health

## 2019-03-07 ENCOUNTER — Ambulatory Visit (INDEPENDENT_AMBULATORY_CARE_PROVIDER_SITE_OTHER): Payer: Medicare Other | Admitting: Adult Health

## 2019-03-07 ENCOUNTER — Other Ambulatory Visit: Payer: Self-pay

## 2019-03-07 VITALS — BP 112/61 | HR 74 | Temp 97.1°F | Resp 16 | Ht 65.0 in | Wt 244.0 lb

## 2019-03-07 DIAGNOSIS — Z7901 Long term (current) use of anticoagulants: Secondary | ICD-10-CM | POA: Diagnosis not present

## 2019-03-07 DIAGNOSIS — L02411 Cutaneous abscess of right axilla: Secondary | ICD-10-CM

## 2019-03-07 DIAGNOSIS — M79621 Pain in right upper arm: Secondary | ICD-10-CM | POA: Diagnosis not present

## 2019-03-07 LAB — POCT INR: INR: 1.8 — AB (ref 2.0–3.0)

## 2019-03-07 NOTE — Progress Notes (Signed)
[facility] [place], [place] [address]  Internal MEDICINE  Office Visit Note  Patient Name: Brenda Santana  [id number]  [id number]  Date of Service: 03/07/2019  Chief Complaint  Patient presents with  . Diabetes  . Hyperlipidemia  . Hypertension  . Follow-up    HPI Patient is here for follow-up on right under arm boil that was drained previously. Boil that was drained is healing well. A second boil has started to form on right arm around the bicep area. Patient states this boil has been forming for several days and is starting to cause her pain. Is still taking her 14 day course of Bactrim and is holding off taking doxycycline at this time. Denies fevers.    Current Medication: Outpatient Encounter Medications as of 03/07/2019  Medication Sig Note  . acyclovir (ZOVIRAX) 400 MG tablet Take 1 tablet (400 mg total) by mouth 2 (two) times daily.   Marland Kitchen acyclovir ointment (ZOVIRAX) 5 % APPLY TOPICALLY EVERY 3 (THREE) HOURS.   Marland Kitchen albuterol (PROAIR HFA) 108 (90 Base) MCG/ACT inhaler INHALE 2 PUFFS INTO THE LUNGS EVERY 4 (FOUR) HOURS AS NEEDED FOR WHEEZING OR SHORTNESS OF BREATH.   Marland Kitchen allopurinol (ZYLOPRIM) 300 MG tablet TAKE ONE TAB AT NIGHT FOR GOUT   . aspirin EC 81 MG tablet Take 81 mg by mouth daily.   . BD PEN NEEDLE NANO U/F 32G X 4 MM MISC USE DAILY WITH VICTOZA   . benazepril (LOTENSIN) 10 MG tablet Take 1 tablet (10 mg total) by mouth daily.   . Biotin 5 MG CAPS Take 5 mg by mouth daily.    . Canagliflozin-metFORMIN HCl (INVOKAMET) 50-500 MG TABS Take 1 tablet by mouth 2 (two) times daily.   . cetirizine (ZYRTEC ALLERGY) 10 MG tablet Take 10 mg by mouth daily as needed for allergies.    . chlorthalidone (HYGROTON) 25 MG tablet TAKE 1 TABLET BY MOUTH EVERY DAY IN THE MORNING   . clobetasol cream (TEMOVATE) [id number] % Apply 1 application topically 2 (two) times daily as needed (for eczema on hands).    . COMBIVENT RESPIMAT 20-100 MCG/ACT AERS respimat INHALE 1  PUFF INTO THE LUNGS EVERY 6 (SIX) HOURS.   Marland Kitchen diclofenac sodium (VOLTAREN) 1 % GEL APPLY 4 G TOPICALLY FOUR (4) TIMES A DAY.   Marland Kitchen docusate sodium (COLACE) 100 MG capsule Take 100 mg by mouth daily.    Marland Kitchen doxycycline (VIBRA-TABS) 100 MG tablet Take 100 mg by mouth 2 (two) times daily.  01/31/2018: Continuous  . famotidine (PEPCID) 20 MG tablet Take 20 mg by mouth daily as needed for heartburn or indigestion.    . Ferrous Sulfate (IRON) 325 (65 Fe) MG TABS Take 325 mg by mouth 3 (three) times daily.  01/31/2018: Stopped for procedure  . Fluticasone-Salmeterol (ADVAIR DISKUS) 500-50 MCG/DOSE AEPB Inhale 1 puff into the lungs 2 (two) times daily as needed (for shortness of breath or wheezing).    . gabapentin (NEURONTIN) 800 MG tablet Take 800 mg by mouth 3 (three) times daily as needed (for pain).    Marland Kitchen ipratropium-albuterol (DUONEB) 0.5-2.5 (3) MG/3ML SOLN Take 3 mLs by nebulization every 6 (six) hours as needed (for shortness of breath or wheezing).    Marland Kitchen lidocaine (XYLOCAINE) 5 % ointment APPLY A SMALL AMOUNT TO AFFECTED AREA 2-3 TIMES A DAY   . LINZESS 145 MCG CAPS capsule TAKE 1 CAPSULE (145 MCG TOTAL) BY MOUTH DAILY BEFORE BREAKFAST.   Marland Kitchen lisinopril (ZESTRIL) 10 MG  tablet TAKE 1 TAB DAILY IN MORNING (Patient taking differently: Change in therapy due to backorder on lisinopril)   . metoprolol tartrate (LOPRESSOR) 50 MG tablet Take 1 tablet (50 mg total) by mouth 2 (two) times daily.   . montelukast (SINGULAIR) 10 MG tablet TAKE 1 TABLET BY MOUTH DAILY FOR ASTHMA   . oxyCODONE-acetaminophen (PERCOCET/ROXICET) 5-325 MG tablet Take 1 tablet by mouth every 6 (six) hours as needed for severe pain.   Marland Kitchen oxymetazoline (AFRIN) 0.05 % nasal spray Place 1 spray into both nostrils 2 (two) times daily as needed for congestion.   . phentermine (ADIPEX-P) 37.5 MG tablet Take 1 tablet (37.5 mg total) by mouth daily before breakfast.   . predniSONE (DELTASONE) 5 MG tablet Take 2 tablets (10 mg total) by mouth daily with  breakfast.   . sulfamethoxazole-trimethoprim (BACTRIM DS) 800-160 MG tablet Take 1 tablet by mouth 2 (two) times daily.   . SYMBICORT 160-4.5 MCG/ACT inhaler INHALE 2 PUFF TWICE A DAY   . tiZANidine (ZANAFLEX) 4 MG tablet TAKE 1 TABLET (4 MG TOTAL) BY MOUTH 3 (THREE) TIMES DAILY.   . traMADol (ULTRAM) 50 MG tablet Take 1 tablet (50 mg total) by mouth every 8 (eight) hours as needed for moderate pain.   Marland Kitchen tretinoin (RETIN-A) 0.025 % cream Apply 1 application topically at bedtime as needed (for eczema on face).    Marland Kitchen umeclidinium bromide (INCRUSE ELLIPTA) 62.5 MCG/INH AEPB Take one puff every day   . VICTOZA 18 MG/3ML SOPN INJECT 1.8 MG INTO THE SKIN EVERY MORNING   . warfarin (COUMADIN) 1 MG tablet Take one tab as needed with coumadin 5 mg qd   . warfarin (COUMADIN) 5 MG tablet Take a whole tablet on Monday, Tuesday, Thursday, Saturday and Sunday.  Take a 1/2 tablet on Wednesday and Friday.    No facility-administered encounter medications on file as of 03/07/2019.     Surgical History: Past Surgical History:  Procedure Laterality Date  . APPENDECTOMY    . COLONOSCOPY WITH PROPOFOL N/A 02/02/2018   Procedure: COLONOSCOPY WITH PROPOFOL;  Surgeon: Jonathon Bellows, MD;  Location: [facility] ENDOSCOPY;  Service: Gastroenterology;  Laterality: N/A;  . LAPAROSCOPIC APPENDECTOMY N/A 02/05/2018   Procedure: APPENDECTOMY LAPAROSCOPIC;  Surgeon: Jules Husbands, MD;  Location: [facility] ORS;  Service: General;  Laterality: N/A;  . right arm surgery    . TUBAL LIGATION      Medical History: Past Medical History:  Diagnosis Date  . Asthma   . Atopic dermatitis   . Constipation   . COPD (chronic obstructive pulmonary disease) ([place])   . Diabetes mellitus without complication ([place])   . DVT (deep venous thrombosis) ([place])   . GERD (gastroesophageal reflux disease)   . Hyperlipidemia   . Hypertension   . Rheumatoid arthritis ([place])     Family History: Family History  Problem Relation Age of Onset  . Breast  cancer Mother   . Hypertension Mother   . Diabetes Mother   . Hypertension Father   . Heart failure Father     Social History   Socioeconomic History  . Marital status: Single    Spouse name: Not on file  . Number of children: Not on file  . Years of education: Not on file  . Highest education level: Not on file  Occupational History  . Not on file  Social Needs  . Financial resource strain: Not on file  . Food insecurity    Worry: Not on file  Inability: Not on file  . Transportation needs    Medical: Not on file    Non-medical: Not on file  Tobacco Use  . Smoking status: Former Research scientist (life sciences)  . Smokeless tobacco: Never Used  Substance and Sexual Activity  . Alcohol use: No  . Drug use: No  . Sexual activity: Yes  Lifestyle  . Physical activity    Days per week: Not on file    Minutes per session: Not on file  . Stress: Not on file  Relationships  . Social Herbalist on phone: Not on file    Gets together: Not on file    Attends religious service: Not on file    Active member of club or organization: Not on file    Attends meetings of clubs or organizations: Not on file    Relationship status: Not on file  . Intimate partner violence    Fear of current or ex partner: Not on file    Emotionally abused: Not on file    Physically abused: Not on file    Forced sexual activity: Not on file  Other Topics Concern  . Not on file  Social History Narrative  . Not on file    Review of Systems  Constitutional: Negative for chills, fatigue and unexpected weight change.  HENT: Negative for congestion, rhinorrhea, sneezing and sore throat.   Eyes: Negative for photophobia, pain and redness.  Respiratory: Negative for cough, chest tightness and shortness of breath.   Cardiovascular: Negative for chest pain and palpitations.  Gastrointestinal: Negative for abdominal pain, constipation, diarrhea, nausea and vomiting.  Endocrine: Negative.   Genitourinary: Negative  for dysuria and frequency.  Musculoskeletal: Negative for arthralgias, back pain, joint swelling and neck pain.  Skin: Negative for rash.       Boil on right upper arm  Allergic/Immunologic: Negative.   Neurological: Negative for tremors and numbness.  Hematological: Negative for adenopathy. Does not bruise/bleed easily.  Psychiatric/Behavioral: Negative for behavioral problems and sleep disturbance. The patient is not nervous/anxious.       Vital Signs: BP 112/61   Pulse 74   Temp (!) 97.1 F (36.2 C)   Resp 16   Ht 5\' 5"  (1.651 m)   Wt 244 lb (110.7 kg)   SpO2 98%   BMI 40.60 kg/m    Physical Exam Vitals signs and nursing note reviewed.  Constitutional:      General: She is not in acute distress.    Appearance: She is well-developed. She is not diaphoretic.  HENT:     Head: Normocephalic and atraumatic.     Mouth/Throat:     Pharynx: No oropharyngeal exudate.  Eyes:     Pupils: Pupils are equal, round, and reactive to light.  Neck:     Musculoskeletal: Normal range of motion and neck supple.     Thyroid: No thyromegaly.     Vascular: No JVD.     Trachea: No tracheal deviation.  Cardiovascular:     Rate and Rhythm: Normal rate and regular rhythm.     Heart sounds: Normal heart sounds. No murmur. No friction rub. No gallop.   Pulmonary:     Effort: Pulmonary effort is normal. No respiratory distress.     Breath sounds: Normal breath sounds. No wheezing or rales.  Chest:     Chest wall: No tenderness.  Abdominal:     Palpations: Abdomen is soft.     Tenderness: There is no abdominal tenderness. There is  no guarding.  Musculoskeletal: Normal range of motion.  Lymphadenopathy:     Cervical: No cervical adenopathy.  Skin:    General: Skin is warm and dry.     Comments: Incision from previous abscess healing well. Non-tender, pink, no erythema. Second boil forming on right upper arm, warm to touch, hardened under skin, tender.  Neurological:     Mental Status:  She is alert and oriented to person, place, and time.     Cranial Nerves: No cranial nerve deficit.  Psychiatric:        Behavior: Behavior normal.        Thought Content: Thought content normal.        Judgment: Judgment normal.     Assessment/Plan: 1. Abscess of axilla, right Healing well. No evidence of abscess formation in this area.  2. Pain in right upper arm Formation of abscess in this area with redness, warmth and tenderness. Informed patient to continue plan for antibiotic therapy. Discussed with patient about calling [facility] dermatology as this is where she has been seen and treated before for same issues. In the meantime, if new boil becomes painful or bigger to contact office back for potential drainage.  3. Encounter for current long-term use of anticoagulants Double dose tonight then continue with current dosage. Will continue to monitor. - POCT INR    General Counseling: Brenda Santana verbalizes understanding of the findings of todays visit and agrees with plan of treatment. I have discussed any further diagnostic evaluation that may be needed or ordered today. We also reviewed her medications today. she has been encouraged to call the office with any questions or concerns that should arise related to todays visit.    Orders Placed This Encounter  Procedures  . POCT INR    No orders of the defined types were placed in this encounter.   Time spent: 25 Minutes   This patient was seen by Orson Gear AGNP-C in Collaboration with Dr Lavera Guise as a part of collaborative care agreement     Kendell Bane AGNP-C Internal medicine

## 2019-03-13 ENCOUNTER — Telehealth: Payer: Self-pay

## 2019-03-13 NOTE — Telephone Encounter (Signed)
Left message advising pt of ultrasound appt. Brenda Santana

## 2019-03-15 ENCOUNTER — Ambulatory Visit (INDEPENDENT_AMBULATORY_CARE_PROVIDER_SITE_OTHER): Payer: Medicare Other

## 2019-03-15 ENCOUNTER — Ambulatory Visit: Payer: Medicare Other

## 2019-03-15 ENCOUNTER — Other Ambulatory Visit: Payer: Self-pay

## 2019-03-15 DIAGNOSIS — R0602 Shortness of breath: Secondary | ICD-10-CM

## 2019-03-15 DIAGNOSIS — Z7952 Long term (current) use of systemic steroids: Secondary | ICD-10-CM

## 2019-03-15 DIAGNOSIS — Z23 Encounter for immunization: Secondary | ICD-10-CM | POA: Diagnosis not present

## 2019-03-15 DIAGNOSIS — Z7901 Long term (current) use of anticoagulants: Secondary | ICD-10-CM

## 2019-03-15 LAB — POCT INR: INR: 1.7 — AB (ref 2.0–3.0)

## 2019-03-15 NOTE — Progress Notes (Signed)
PT/INR 21.0 / 1.7 Per Heather add 1 mg tab to her 5 mg fri, sat, sun, mon-thurs 5 mg tab recheck 1 week

## 2019-03-22 ENCOUNTER — Other Ambulatory Visit: Payer: Self-pay

## 2019-03-22 ENCOUNTER — Ambulatory Visit (INDEPENDENT_AMBULATORY_CARE_PROVIDER_SITE_OTHER): Payer: Medicare Other

## 2019-03-22 DIAGNOSIS — Z7901 Long term (current) use of anticoagulants: Secondary | ICD-10-CM

## 2019-03-22 LAB — POCT INR: INR: 2.5 (ref 2.0–3.0)

## 2019-03-22 NOTE — Progress Notes (Signed)
PT/INR 29.6/2.5 Per Brenda Santana add 1 mg tab to her 5 mg fri, sat, sun, and take 1 tab of coumadin 5 mg mon, wed thurs  recheck 1 week

## 2019-03-26 LAB — LIPID PANEL WITH LDL/HDL RATIO
Cholesterol, Total: 203 mg/dL — ABNORMAL HIGH (ref 100–199)
HDL: 59 mg/dL (ref >39)
LDL Chol Calc (NIH): 125 mg/dL — ABNORMAL HIGH (ref 0–99)
LDL/HDL Ratio: 2.1 ratio (ref 0.0–3.2)
Triglycerides: 107 mg/dL (ref 0–149)
VLDL Cholesterol Cal: 19 mg/dL (ref 5–40)

## 2019-03-26 LAB — COMPREHENSIVE METABOLIC PANEL WITH GFR
ALT: 13 [IU]/L (ref 0–32)
AST: 20 [IU]/L (ref 0–40)
Albumin/Globulin Ratio: 1.4 (ref 1.2–2.2)
Albumin: 4.2 g/dL (ref 3.8–4.9)
Alkaline Phosphatase: 128 [IU]/L — ABNORMAL HIGH (ref 39–117)
BUN/Creatinine Ratio: 24 — ABNORMAL HIGH (ref 9–23)
BUN: 23 mg/dL (ref 6–24)
Bilirubin Total: 0.2 mg/dL (ref 0.0–1.2)
CO2: 23 mmol/L (ref 20–29)
Calcium: 9.5 mg/dL (ref 8.7–10.2)
Chloride: 100 mmol/L (ref 96–106)
Creatinine, Ser: 0.94 mg/dL (ref 0.57–1.00)
GFR calc Af Amer: 77 mL/min/{1.73_m2}
GFR calc non Af Amer: 67 mL/min/{1.73_m2}
Globulin, Total: 2.9 g/dL (ref 1.5–4.5)
Glucose: 121 mg/dL — ABNORMAL HIGH (ref 65–99)
Potassium: 4 mmol/L (ref 3.5–5.2)
Sodium: 139 mmol/L (ref 134–144)
Total Protein: 7.1 g/dL (ref 6.0–8.5)

## 2019-03-26 LAB — CBC WITH DIFFERENTIAL/PLATELET
Basophils Absolute: 0 10*3/uL (ref 0.0–0.2)
Basos: 0 %
EOS (ABSOLUTE): 0.2 10*3/uL (ref 0.0–0.4)
Eos: 3 %
Hematocrit: 39.5 % (ref 34.0–46.6)
Hemoglobin: 13.2 g/dL (ref 11.1–15.9)
Immature Grans (Abs): 0 10*3/uL (ref 0.0–0.1)
Immature Granulocytes: 0 %
Lymphocytes Absolute: 1.6 10*3/uL (ref 0.7–3.1)
Lymphs: 20 %
MCH: 31.2 pg (ref 26.6–33.0)
MCHC: 33.4 g/dL (ref 31.5–35.7)
MCV: 93 fL (ref 79–97)
Monocytes Absolute: 0.7 10*3/uL (ref 0.1–0.9)
Monocytes: 8 %
Neutrophils Absolute: 5.5 10*3/uL (ref 1.4–7.0)
Neutrophils: 69 %
Platelets: 337 10*3/uL (ref 150–450)
RBC: 4.23 x10E6/uL (ref 3.77–5.28)
RDW: 13.1 % (ref 11.7–15.4)
WBC: 8.1 10*3/uL (ref 3.4–10.8)

## 2019-03-26 LAB — T4, FREE: Free T4: 1.11 ng/dL (ref 0.82–1.77)

## 2019-03-26 LAB — RHEUMATOID FACTOR: Rheumatoid fact SerPl-aCnc: 13.9 IU/mL (ref 0.0–13.9)

## 2019-03-26 LAB — IRON,TIBC AND FERRITIN PANEL
Ferritin: 292 ng/mL — ABNORMAL HIGH (ref 15–150)
Iron Saturation: 31 % (ref 15–55)
Iron: 79 ug/dL (ref 27–159)
Total Iron Binding Capacity: 251 ug/dL (ref 250–450)
UIBC: 172 ug/dL (ref 131–425)

## 2019-03-26 LAB — ANA W/REFLEX IF POSITIVE: Anti Nuclear Antibody (ANA): NEGATIVE

## 2019-03-26 LAB — SEDIMENTATION RATE: Sed Rate: 43 mm/hr — ABNORMAL HIGH (ref 0–40)

## 2019-03-26 LAB — TSH: TSH: 1.09 u[IU]/mL (ref 0.450–4.500)

## 2019-03-27 ENCOUNTER — Other Ambulatory Visit: Payer: Self-pay | Admitting: Internal Medicine

## 2019-03-27 DIAGNOSIS — R0602 Shortness of breath: Secondary | ICD-10-CM

## 2019-03-27 DIAGNOSIS — R059 Cough, unspecified: Secondary | ICD-10-CM

## 2019-03-27 DIAGNOSIS — J411 Mucopurulent chronic bronchitis: Secondary | ICD-10-CM

## 2019-03-27 DIAGNOSIS — R05 Cough: Secondary | ICD-10-CM

## 2019-04-01 ENCOUNTER — Ambulatory Visit: Payer: Medicare Other

## 2019-04-05 ENCOUNTER — Other Ambulatory Visit: Payer: Self-pay

## 2019-04-05 ENCOUNTER — Ambulatory Visit (INDEPENDENT_AMBULATORY_CARE_PROVIDER_SITE_OTHER): Payer: Medicare Other

## 2019-04-05 ENCOUNTER — Ambulatory Visit: Payer: Medicare Other

## 2019-04-05 DIAGNOSIS — Z7901 Long term (current) use of anticoagulants: Secondary | ICD-10-CM | POA: Diagnosis not present

## 2019-04-05 DIAGNOSIS — J449 Chronic obstructive pulmonary disease, unspecified: Secondary | ICD-10-CM

## 2019-04-05 LAB — POCT INR: INR: 2.2 (ref 2.0–3.0)

## 2019-04-05 MED ORDER — PREDNISONE 5 MG PO TABS: 10.0000 mg | ORAL_TABLET | Freq: Every day | ORAL | 0 refills | Status: DC

## 2019-04-05 NOTE — Progress Notes (Signed)
PT/INR  26.9 /2.2 continue with current treatment , pt will be seen back on 12/15 for follow up

## 2019-04-12 ENCOUNTER — Telehealth: Payer: Self-pay

## 2019-04-12 NOTE — Telephone Encounter (Signed)
LMOM FOR PATIENT TO SCREEN AND CONFIRM 04-16-19 OV. 

## 2019-04-13 ENCOUNTER — Other Ambulatory Visit: Payer: Self-pay | Admitting: Adult Health

## 2019-04-13 DIAGNOSIS — B009 Herpesviral infection, unspecified: Secondary | ICD-10-CM

## 2019-04-16 ENCOUNTER — Encounter: Payer: Self-pay | Admitting: Adult Health

## 2019-04-16 ENCOUNTER — Ambulatory Visit (INDEPENDENT_AMBULATORY_CARE_PROVIDER_SITE_OTHER): Payer: Medicare Other | Admitting: Adult Health

## 2019-04-16 ENCOUNTER — Other Ambulatory Visit: Payer: Self-pay

## 2019-04-16 VITALS — BP 113/61 | HR 88 | Temp 97.3°F | Resp 16 | Ht 65.0 in | Wt 251.0 lb

## 2019-04-16 DIAGNOSIS — E782 Mixed hyperlipidemia: Secondary | ICD-10-CM | POA: Diagnosis not present

## 2019-04-16 DIAGNOSIS — Z0001 Encounter for general adult medical examination with abnormal findings: Secondary | ICD-10-CM | POA: Diagnosis not present

## 2019-04-16 DIAGNOSIS — R300 Dysuria: Secondary | ICD-10-CM

## 2019-04-16 DIAGNOSIS — E119 Type 2 diabetes mellitus without complications: Secondary | ICD-10-CM

## 2019-04-16 DIAGNOSIS — L732 Hidradenitis suppurativa: Secondary | ICD-10-CM

## 2019-04-16 DIAGNOSIS — Z124 Encounter for screening for malignant neoplasm of cervix: Secondary | ICD-10-CM

## 2019-04-16 DIAGNOSIS — Z7901 Long term (current) use of anticoagulants: Secondary | ICD-10-CM | POA: Diagnosis not present

## 2019-04-16 DIAGNOSIS — B009 Herpesviral infection, unspecified: Secondary | ICD-10-CM | POA: Diagnosis not present

## 2019-04-16 DIAGNOSIS — J302 Other seasonal allergic rhinitis: Secondary | ICD-10-CM

## 2019-04-16 DIAGNOSIS — Z1231 Encounter for screening mammogram for malignant neoplasm of breast: Secondary | ICD-10-CM

## 2019-04-16 LAB — POCT INR: INR: 3.3 — AB (ref 2.0–3.0)

## 2019-04-16 MED ORDER — ACYCLOVIR 400 MG PO TABS: 400.0000 mg | ORAL_TABLET | Freq: Two times a day (BID) | ORAL | 1 refills | Status: DC

## 2019-04-16 MED ORDER — ATORVASTATIN CALCIUM 10 MG PO TABS: 10.0000 mg | ORAL_TABLET | Freq: Every day | ORAL | 1 refills | Status: DC

## 2019-04-16 MED ORDER — DOXYCYCLINE HYCLATE 100 MG PO TABS: 100.0000 mg | ORAL_TABLET | Freq: Two times a day (BID) | ORAL | 3 refills | Status: DC

## 2019-04-16 MED ORDER — CETIRIZINE HCL 10 MG PO TABS: 10.0000 mg | ORAL_TABLET | Freq: Every day | ORAL | 2 refills | Status: DC | PRN

## 2019-04-16 MED ORDER — INVOKAMET 50-500 MG PO TABS: 1.0000 | ORAL_TABLET | Freq: Two times a day (BID) | ORAL | 0 refills | Status: DC

## 2019-04-16 NOTE — Progress Notes (Signed)
[facility], [place] [address]  Internal MEDICINE  Office Visit Note  Patient Name: Brenda Santana  [id number]  [id number]  Date of Service: 05/01/2019  Chief Complaint  Patient presents with  . Medical Management of Chronic Issues    review echo   . Annual Exam  . Diabetes  . Atrial Fibrillation     HPI Pt is here for routine health maintenance examination. Well appearing 57 year old [place] American female, no acute issues to discuss. Review of lab work reveals slightly elevated lipid panel, discussed diet modification and statin therapy. Blood pressure stable today at 113/61, denies chest pain, dizziness or headaches. INR check today of 3.3, will adjust medications as necessary. History of multiple abscess under bilateral axilla, stable at this time, would like to resume prophylaxis doxycycline, is scheduled to see dermatology next month. Reports well balanced diet, no difficulties with elimination, stress levels balanced, reports sleeping well each night, attempting to exercise more, denies shortness of breath or heart palpitations.   Currently taking 1 pill M-Th 2 pills Fri, Sat, Sun change to 1 pill M-F 2 pills Sat and Sun  Current Medication: Outpatient Encounter Medications as of 04/16/2019  Medication Sig Note  . acyclovir (ZOVIRAX) 400 MG tablet Take 1 tablet (400 mg total) by mouth 2 (two) times daily.   Marland Kitchen albuterol (PROAIR HFA) 108 (90 Base) MCG/ACT inhaler INHALE 2 PUFFS INTO THE LUNGS EVERY 4 (FOUR) HOURS AS NEEDED FOR WHEEZING OR SHORTNESS OF BREATH.   Marland Kitchen allopurinol (ZYLOPRIM) 300 MG tablet TAKE ONE TAB AT NIGHT FOR GOUT   . aspirin EC 81 MG tablet Take 81 mg by mouth daily.   . BD PEN NEEDLE NANO U/F 32G X 4 MM MISC USE DAILY WITH VICTOZA   . benazepril (LOTENSIN) 10 MG tablet Take 1 tablet (10 mg total) by mouth daily.   . Biotin 5 MG CAPS Take 5 mg by mouth daily.    . Canagliflozin-metFORMIN HCl (INVOKAMET) 50-500 MG TABS Take  1 tablet by mouth 2 (two) times daily.   . cetirizine (ZYRTEC ALLERGY) 10 MG tablet Take 1 tablet (10 mg total) by mouth daily as needed for allergies.   . chlorthalidone (HYGROTON) 25 MG tablet TAKE 1 TABLET BY MOUTH EVERY DAY IN THE MORNING   . clobetasol cream (TEMOVATE) 2.70 % Apply 1 application topically 2 (two) times daily as needed (for eczema on hands).    . COMBIVENT RESPIMAT 20-100 MCG/ACT AERS respimat INHALE 1 PUFF INTO THE LUNGS EVERY 6 (SIX) HOURS.   Marland Kitchen diclofenac sodium (VOLTAREN) 1 % GEL APPLY 4 G TOPICALLY FOUR (4) TIMES A DAY.   Marland Kitchen docusate sodium (COLACE) 100 MG capsule Take 100 mg by mouth daily.    Marland Kitchen doxycycline (VIBRA-TABS) 100 MG tablet Take 1 tablet (100 mg total) by mouth 2 (two) times daily.   . famotidine (PEPCID) 20 MG tablet Take 20 mg by mouth daily as needed for heartburn or indigestion.    . Ferrous Sulfate (IRON) 325 (65 Fe) MG TABS Take 325 mg by mouth 3 (three) times daily.  01/31/2018: Stopped for procedure  . gabapentin (NEURONTIN) 800 MG tablet Take 800 mg by mouth 3 (three) times daily as needed (for pain).    Marland Kitchen ipratropium-albuterol (DUONEB) 0.5-2.5 (3) MG/3ML SOLN Take 3 mLs by nebulization every 6 (six) hours as needed (for shortness of breath or wheezing).    Marland Kitchen lidocaine (XYLOCAINE) 5 % ointment APPLY A SMALL AMOUNT TO AFFECTED  AREA 2-3 TIMES A DAY   . LINZESS 145 MCG CAPS capsule TAKE 1 CAPSULE (145 MCG TOTAL) BY MOUTH DAILY BEFORE BREAKFAST.   . metoprolol tartrate (LOPRESSOR) 50 MG tablet Take 1 tablet (50 mg total) by mouth 2 (two) times daily.   . montelukast (SINGULAIR) 10 MG tablet TAKE 1 TABLET BY MOUTH DAILY FOR ASTHMA   . oxyCODONE-acetaminophen (PERCOCET/ROXICET) 5-325 MG tablet Take 1 tablet by mouth every 6 (six) hours as needed for severe pain.   Marland Kitchen oxymetazoline (AFRIN) 0.05 % nasal spray Place 1 spray into both nostrils 2 (two) times daily as needed for congestion.   . phentermine (ADIPEX-P) 37.5 MG tablet Take 1 tablet (37.5 mg total) by  mouth daily before breakfast.   . sulfamethoxazole-trimethoprim (BACTRIM DS) 800-160 MG tablet Take 1 tablet by mouth 2 (two) times daily.   Marland Kitchen tiZANidine (ZANAFLEX) 4 MG tablet TAKE 1 TABLET (4 MG TOTAL) BY MOUTH 3 (THREE) TIMES DAILY.   . traMADol (ULTRAM) 50 MG tablet Take 1 tablet (50 mg total) by mouth every 8 (eight) hours as needed for moderate pain.   Marland Kitchen tretinoin (RETIN-A) 0.025 % cream Apply 1 application topically at bedtime as needed (for eczema on face).    Marland Kitchen umeclidinium bromide (INCRUSE ELLIPTA) 62.5 MCG/INH AEPB Take one puff every day   . VICTOZA 18 MG/3ML SOPN INJECT 1.8 MG INTO THE SKIN EVERY MORNING   . warfarin (COUMADIN) 1 MG tablet Take one tab as needed with coumadin 5 mg qd   . warfarin (COUMADIN) 5 MG tablet Take a whole tablet on Monday, Tuesday, Thursday, Saturday and Sunday.  Take a 1/2 tablet on Wednesday and Friday.   . [DISCONTINUED] acyclovir (ZOVIRAX) 400 MG tablet TAKE 1 TABLET BY MOUTH TWICE A DAY   . [DISCONTINUED] acyclovir ointment (ZOVIRAX) 5 % APPLY TOPICALLY EVERY 3 (THREE) HOURS.   . [DISCONTINUED] Canagliflozin-metFORMIN HCl (INVOKAMET) 50-500 MG TABS Take 1 tablet by mouth 2 (two) times daily.   . [DISCONTINUED] cetirizine (ZYRTEC ALLERGY) 10 MG tablet Take 10 mg by mouth daily as needed for allergies.    . [DISCONTINUED] doxycycline (VIBRA-TABS) 100 MG tablet Take 100 mg by mouth 2 (two) times daily.  01/31/2018: Continuous  . [DISCONTINUED] predniSONE (DELTASONE) 5 MG tablet Take 2 tablets (10 mg total) by mouth daily with breakfast.   . atorvastatin (LIPITOR) 10 MG tablet Take 1 tablet (10 mg total) by mouth daily.   . [DISCONTINUED] acyclovir (ZOVIRAX) 400 MG tablet Take 1 tablet (400 mg total) by mouth 2 (two) times daily.   . [DISCONTINUED] Fluticasone-Salmeterol (ADVAIR DISKUS) 500-50 MCG/DOSE AEPB Inhale 1 puff into the lungs 2 (two) times daily as needed (for shortness of breath or wheezing).    . [DISCONTINUED] Ipratropium-Albuterol (COMBIVENT  RESPIMAT) 20-100 MCG/ACT AERS respimat Inhale 1 puff into the lungs every 6 (six) hours.   . [DISCONTINUED] linaclotide (LINZESS) 145 MCG CAPS capsule TAKE 1 CAPSULE (145 MCG TOTAL) BY MOUTH DAILY BEFORE BREAKFAST.   . [DISCONTINUED] lisinopril (ZESTRIL) 10 MG tablet TAKE 1 TAB DAILY IN MORNING (Patient taking differently: Change in therapy due to backorder on lisinopril)   . [DISCONTINUED] predniSONE (DELTASONE) 5 MG tablet TAKE 2 TABLETS (10 MG TOTAL) BY MOUTH DAILY WITH BREAKFAST. (Patient not taking: Reported on 03/01/2019)   . [DISCONTINUED] SYMBICORT 160-4.5 MCG/ACT inhaler INHALE 2 PUFF TWICE A DAY    No facility-administered encounter medications on file as of 04/16/2019.    Surgical History: Past Surgical History:  Procedure Laterality Date  .  APPENDECTOMY    . COLONOSCOPY WITH PROPOFOL N/A 02/02/2018   Procedure: COLONOSCOPY WITH PROPOFOL;  Surgeon: Jonathon Bellows, MD;  Location: [facility] ENDOSCOPY;  Service: Gastroenterology;  Laterality: N/A;  . LAPAROSCOPIC APPENDECTOMY N/A 02/05/2018   Procedure: APPENDECTOMY LAPAROSCOPIC;  Surgeon: Jules Husbands, MD;  Location: [facility] ORS;  Service: General;  Laterality: N/A;  . right arm surgery    . TUBAL LIGATION      Medical History: Past Medical History:  Diagnosis Date  . Asthma   . Atopic dermatitis   . Constipation   . COPD (chronic obstructive pulmonary disease) ([place])   . Diabetes mellitus without complication ([place])   . DVT (deep venous thrombosis) ([place])   . GERD (gastroesophageal reflux disease)   . Hyperlipidemia   . Hypertension   . Rheumatoid arthritis ([place])     Family History: Family History  Problem Relation Age of Onset  . Breast cancer Mother   . Hypertension Mother   . Diabetes Mother   . Hypertension Father   . Heart failure Father       Review of Systems  Constitutional: Negative for chills, fatigue and unexpected weight change.  HENT: Negative for congestion, rhinorrhea, sneezing and sore throat.   Eyes:  Negative for photophobia, pain and redness.  Respiratory: Negative for cough, chest tightness and shortness of breath.   Cardiovascular: Negative for chest pain and palpitations.  Gastrointestinal: Negative for abdominal pain, constipation, diarrhea, nausea and vomiting.  Endocrine: Negative.   Genitourinary: Negative for dysuria and frequency.  Musculoskeletal: Negative for arthralgias, back pain, joint swelling and neck pain.  Skin: Negative for rash.  Allergic/Immunologic: Negative.   Neurological: Negative for tremors and numbness.  Hematological: Negative for adenopathy. Does not bruise/bleed easily.  Psychiatric/Behavioral: Negative for behavioral problems and sleep disturbance. The patient is not nervous/anxious.     Vital Signs: BP 113/61   Pulse 88   Temp (!) 97.3 F (36.3 C)   Resp 16   Ht '5\' 5"'  (1.651 m)   Wt 251 lb (113.9 kg)   SpO2 99%   BMI 41.77 kg/m    Physical Exam Vitals and nursing note reviewed. Exam conducted with a chaperone present Casey Burkitt, DNP student).  Constitutional:      General: She is not in acute distress.    Appearance: She is well-developed. She is not diaphoretic.  HENT:     Head: Normocephalic and atraumatic.     Mouth/Throat:     Pharynx: No oropharyngeal exudate.  Eyes:     Pupils: Pupils are equal, round, and reactive to light.  Neck:     Thyroid: No thyromegaly.     Vascular: No JVD.     Trachea: No tracheal deviation.  Cardiovascular:     Rate and Rhythm: Normal rate and regular rhythm.     Heart sounds: Normal heart sounds. No murmur. No friction rub. No gallop.   Pulmonary:     Effort: Pulmonary effort is normal. No respiratory distress.     Breath sounds: Normal breath sounds. No wheezing or rales.  Chest:     Chest wall: No tenderness.  Abdominal:     Palpations: Abdomen is soft.     Tenderness: There is no abdominal tenderness. There is no guarding.  Musculoskeletal:        General: Normal range of motion.      Cervical back: Normal range of motion and neck supple.  Feet:     Comments: Sensation intact to bilateral feet Proprioception intact  to bilateral feet No evidence of wounds Lymphadenopathy:     Cervical: No cervical adenopathy.  Skin:    General: Skin is warm and dry.  Neurological:     Mental Status: She is alert and oriented to person, place, and time.     Cranial Nerves: No cranial nerve deficit.  Psychiatric:        Behavior: Behavior normal.        Thought Content: Thought content normal.        Judgment: Judgment normal.     LABS: Recent Results (from the past 2160 hour(s))  POCT INR     Status: Abnormal   Collection Time: 02/13/19 12:24 PM  Result Value Ref Range   INR 1.4 (A) 2.0 - 3.0    Comment: pt 16.9  POCT INR     Status: Abnormal   Collection Time: 02/21/19  8:43 AM  Result Value Ref Range   INR 1.7 (A) 2.0 - 3.0    Comment: PT 20.4  Anaerobic and Aerobic Culture     Status: Abnormal   Collection Time: 02/25/19  2:59 PM   Specimen: Axilla, Right; Abscess   ABSCESS  Result Value Ref Range   Anaerobic Culture Final report (A)    Result 1 Comment (A)     Comment: Mixed anaerobic organisms, none predominating. Heavy growth    Aerobic Culture Final report    Result 1 Comment     Comment: Mixed skin flora including multiple gram negative rods. Scant growth   POCT HgB A1C     Status: Abnormal   Collection Time: 03/01/19  8:52 AM  Result Value Ref Range   Hemoglobin A1C 6.8 (A) 4.0 - 5.6 %   HbA1c POC (<> result, manual entry)     HbA1c, POC (prediabetic range)     HbA1c, POC (controlled diabetic range)    POCT INR     Status: None   Collection Time: 03/01/19  8:59 AM  Result Value Ref Range   INR 2.0 2.0 - 3.0    Comment: PT 23.5  POCT INR     Status: Abnormal   Collection Time: 03/07/19 12:03 PM  Result Value Ref Range   INR 1.8 (A) 2.0 - 3.0    Comment: PT-22.0  POCT INR     Status: Abnormal   Collection Time: 03/15/19 12:03 PM  Result Value  Ref Range   INR 1.7 (A) 2.0 - 3.0    Comment: pt 21.0  POCT INR     Status: None   Collection Time: 03/22/19  9:08 AM  Result Value Ref Range   INR 2.5 2.0 - 3.0    Comment: PT/INR 29.6/2.5  Per Heather add 1 mg tab to her 5 mg fri, sat, sun, mon-thurs 5 mg tab recheck 1 week  Sed Rate (ESR)     Status: Abnormal   Collection Time: 03/25/19  9:22 AM  Result Value Ref Range   Sed Rate 43 (H) 0 - 40 mm/hr  Rheumatoid Factor     Status: None   Collection Time: 03/25/19  9:22 AM  Result Value Ref Range   Rhuematoid fact SerPl-aCnc 13.9 0.0 - 13.9 IU/mL  Fe+TIBC+Fer     Status: Abnormal   Collection Time: 03/25/19  9:22 AM  Result Value Ref Range   Total Iron Binding Capacity 251 250 - 450 ug/dL   UIBC 172 131 - 425 ug/dL   Iron 79 27 - 159 ug/dL   Iron Saturation 31 15 -  55 %   Ferritin 292 (H) 15 - 150 ng/mL  Comprehensive metabolic panel     Status: Abnormal   Collection Time: 03/25/19  9:22 AM  Result Value Ref Range   Glucose 121 (H) 65 - 99 mg/dL   BUN 23 6 - 24 mg/dL   Creatinine, Ser 0.94 0.57 - 1.00 mg/dL   GFR calc non Af Amer 67 >59 mL/min/1.73   GFR calc Af Amer 77 >59 mL/min/1.73   BUN/Creatinine Ratio 24 (H) 9 - 23   Sodium 139 134 - 144 mmol/L   Potassium 4.0 3.5 - 5.2 mmol/L   Chloride 100 96 - 106 mmol/L   CO2 23 20 - 29 mmol/L   Calcium 9.5 8.7 - 10.2 mg/dL   Total Protein 7.1 6.0 - 8.5 g/dL   Albumin 4.2 3.8 - 4.9 g/dL   Globulin, Total 2.9 1.5 - 4.5 g/dL   Albumin/Globulin Ratio 1.4 1.2 - 2.2   Bilirubin Total 0.2 0.0 - 1.2 mg/dL   Alkaline Phosphatase 128 (H) 39 - 117 IU/L   AST 20 0 - 40 IU/L   ALT 13 0 - 32 IU/L  CBC with Differential/Platelet     Status: None   Collection Time: 03/25/19  9:22 AM  Result Value Ref Range   WBC 8.1 3.4 - 10.8 x10E3/uL   RBC 4.23 3.77 - 5.28 x10E6/uL   Hemoglobin 13.2 11.1 - 15.9 g/dL   Hematocrit 39.5 34.0 - 46.6 %   MCV 93 79 - 97 fL   MCH 31.2 26.6 - 33.0 pg   MCHC 33.4 31.5 - 35.7 g/dL   RDW 13.1 11.7 - 15.4 %    Platelets 337 150 - 450 x10E3/uL   Neutrophils 69 Not Estab. %   Lymphs 20 Not Estab. %   Monocytes 8 Not Estab. %   Eos 3 Not Estab. %   Basos 0 Not Estab. %   Neutrophils Absolute 5.5 1.4 - 7.0 x10E3/uL   Lymphocytes Absolute 1.6 0.7 - 3.1 x10E3/uL   Monocytes Absolute 0.7 0.1 - 0.9 x10E3/uL   EOS (ABSOLUTE) 0.2 0.0 - 0.4 x10E3/uL   Basophils Absolute 0.0 0.0 - 0.2 x10E3/uL   Immature Granulocytes 0 Not Estab. %   Immature Grans (Abs) 0.0 0.0 - 0.1 x10E3/uL  Lipid Panel With LDL/HDL Ratio     Status: Abnormal   Collection Time: 03/25/19  9:22 AM  Result Value Ref Range   Cholesterol, Total 203 (H) 100 - 199 mg/dL   Triglycerides 107 0 - 149 mg/dL   HDL 59 >39 mg/dL   VLDL Cholesterol Cal 19 5 - 40 mg/dL   LDL Chol Calc (NIH) 125 (H) 0 - 99 mg/dL   LDL/HDL Ratio 2.1 0.0 - 3.2 ratio    Comment:                                     LDL/HDL Ratio                                             Men  Women                               1/2 Avg.Risk  1.0    1.5  Avg.Risk  3.6    3.2                                2X Avg.Risk  6.2    5.0                                3X Avg.Risk  8.0    6.1   T4, free     Status: None   Collection Time: 03/25/19  9:22 AM  Result Value Ref Range   Free T4 1.11 0.82 - 1.77 ng/dL  TSH     Status: None   Collection Time: 03/25/19  9:22 AM  Result Value Ref Range   TSH 1.090 0.450 - 4.500 uIU/mL  ANA w/Reflex if Positive     Status: None   Collection Time: 03/25/19  9:22 AM  Result Value Ref Range   Anti Nuclear Antibody (ANA) Negative Negative  POCT INR     Status: None   Collection Time: 04/05/19  8:48 AM  Result Value Ref Range   INR 2.2 2.0 - 3.0    Comment: pt 26.9  UA/M w/rflx Culture, Routine     Status: Abnormal   Collection Time: 04/16/19 12:00 AM   Specimen: Urine   URINE  Result Value Ref Range   Specific Gravity, UA 1.028 1.005 - 1.030   pH, UA 5.0 5.0 - 7.5   Color, UA Yellow Yellow   Appearance  Ur Cloudy (A) Clear   Leukocytes,UA 2+ (A) Negative   Protein,UA Negative Negative/Trace   Glucose, UA 3+ (A) Negative   Ketones, UA Negative Negative   RBC, UA 1+ (A) Negative   Bilirubin, UA Negative Negative   Urobilinogen, Ur 0.2 0.2 - 1.0 mg/dL   Nitrite, UA Negative Negative   Microscopic Examination See below:     Comment: Microscopic was indicated and was performed.   Urinalysis Reflex Comment     Comment: This specimen has reflexed to a Urine Culture.  Pap IG and HPV (high risk) DNA detection     Status: Abnormal   Collection Time: 04/16/19 12:00 AM  Result Value Ref Range   Interpretation NILM     Comment: NEGATIVE FOR INTRAEPITHELIAL LESION OR MALIGNANCY.   Category NIL     Comment: Negative for Intraepithelial Lesion   Infection TRIC     Comment: TRICHOMONAS VAGINALIS IS PRESENT.   Adequacy SECNI     Comment: Satisfactory for evaluation. No endocervical component is identified.   Clinician Provided ICD10 Comment     Comment: Z12.4   Performed by: Comment     Comment: Dondra Spry, Cytotechnologist (ASCP)   Note: Comment     Comment: The Pap smear is a screening test designed to aid in the detection of premalignant and malignant conditions of the uterine cervix.  It is not a diagnostic procedure and should not be used as the sole means of detecting cervical cancer.  Both false-positive and false-negative reports do occur.    Test Methodology Comment     Comment: This liquid based ThinPrep(R) pap test was screened with the use of an image guided system.    HPV, high-risk Positive (A) Negative    Comment: This nucleic acid amplification high-risk HPV test detects thirteen high-risk types (16,18,31,33,35,39,45,51,52,56,58,59,68) without differentiation. **EFFECTIVE May 06, 2019 THIS TEST WILL BE MADE NON-ORDERABLE. SEE [facility] DIRECTORY OF SERVICES FOR  ALTERNATIVES TO THE HIGH RISK HPV TEST. ANY PROFILE THAT INCLUDES THIS TEST WILL ALSO BE MADE  NON-ORDERABLE.**   NuSwab Vaginitis Plus (VG+)     Status: Abnormal   Collection Time: 04/16/19 12:00 AM  Result Value Ref Range   Atopobium vaginae High - 2 (A) Score   BVAB 2 Low - 0 Score   Megasphaera 1 High - 2 (A) Score    Comment: Calculate total score by adding the 3 individual bacterial vaginosis (BV) marker scores together.  Total score is interpreted as follows: Total score 0-1: Indicates the absence of BV. Total score   2: Indeterminate for BV. Additional clinical                  data should be evaluated to establish a                  diagnosis. Total score 3-6: Indicates the presence of BV. This test was developed and its performance characteristics determined by [facility].  It has not been cleared or approved by the Food and Drug Administration.  The FDA has determined that such clearance or approval is not necessary.    Candida albicans, NAA Negative Negative   Candida glabrata, NAA Negative Negative   Trich vag by NAA Positive (A) Negative   Chlamydia trachomatis, NAA Positive (A) Negative   Neisseria gonorrhoeae, NAA Negative Negative  Microscopic Examination     Status: Abnormal   Collection Time: 04/16/19 12:00 AM   URINE  Result Value Ref Range   WBC, UA 11-30 (A) 0 - 5 /hpf   RBC 3-10 (A) 0 - 2 /hpf   Epithelial Cells (non renal) >10 (A) 0 - 10 /hpf   Casts None seen None seen /lpf   Mucus, UA Present Not Estab.   Bacteria, UA Few None seen/Few  Urine Culture, Reflex     Status: None   Collection Time: 04/16/19 12:00 AM   URINE  Result Value Ref Range   Urine Culture, Routine Final report    Organism ID, Bacteria Comment     Comment: Mixed urogenital flora 50,000-100,000 colony forming units per mL   POCT INR     Status: Abnormal   Collection Time: 04/16/19 10:10 AM  Result Value Ref Range   INR 3.3 (A) 2.0 - 3.0    Comment: 40.1  Lipase, blood     Status: None   Collection Time: 04/26/19  6:55 PM  Result Value Ref Range   Lipase 47 11 - 51  U/L    Comment: Performed at [facility], [address] [place],  [address]  Comprehensive metabolic panel     Status: Abnormal   Collection Time: 04/26/19  6:55 PM  Result Value Ref Range   Sodium 134 (L) 135 - 145 mmol/L   Potassium 3.7 3.5 - 5.1 mmol/L   Chloride 96 (L) 98 - 111 mmol/L   CO2 26 22 - 32 mmol/L   Glucose, Bld 113 (H) 70 - 99 mg/dL   BUN 36 (H) 6 - 20 mg/dL   Creatinine, Ser 1.37 (H) 0.44 - 1.00 mg/dL   Calcium 8.9 8.9 - 10.3 mg/dL   Total Protein 7.9 6.5 - 8.1 g/dL   Albumin 3.8 3.5 - 5.0 g/dL   AST 48 (H) 15 - 41 U/L   ALT 21 0 - 44 U/L   Alkaline Phosphatase 77 38 - 126 U/L   Total Bilirubin 0.5 0.3 - 1.2 mg/dL   GFR  calc non Af Amer 42 (L) >60 mL/min   GFR calc Af Amer 49 (L) >60 mL/min   Anion gap 12 5 - 15    Comment: Performed at [facility], [address] [place], [place] [address]  CBC     Status: None   Collection Time: 04/26/19  6:55 PM  Result Value Ref Range   WBC 10.4 4.0 - 10.5 K/uL   RBC 4.46 3.87 - 5.11 MIL/uL   Hemoglobin 13.5 12.0 - 15.0 g/dL   HCT 40.6 36.0 - 46.0 %   MCV 91.0 80.0 - 100.0 fL   MCH 30.3 26.0 - 34.0 pg   MCHC 33.3 30.0 - 36.0 g/dL   RDW 14.6 11.5 - 15.5 %   Platelets 333 150 - 400 K/uL   nRBC 0.0 0.0 - 0.2 %    Comment: Performed at [facility], [address] [place], [place] [address]  Urinalysis, Complete w Microscopic     Status: Abnormal   Collection Time: 04/26/19  6:55 PM  Result Value Ref Range   Color, Urine YELLOW (A) YELLOW   APPearance HAZY (A) CLEAR   Specific Gravity, Urine 1.013 1.005 - 1.030   pH 5.0 5.0 - 8.0   Glucose, UA >=500 (A) NEGATIVE mg/dL   Hgb urine dipstick SMALL (A) NEGATIVE   Bilirubin Urine NEGATIVE NEGATIVE   Ketones, ur NEGATIVE NEGATIVE mg/dL   Protein, ur NEGATIVE NEGATIVE mg/dL   Nitrite NEGATIVE NEGATIVE   Leukocytes,Ua SMALL (A) NEGATIVE   RBC / HPF 0-5 0 - 5 RBC/hpf   WBC, UA 0-5 0 - 5 WBC/hpf   Bacteria, UA RARE (A) NONE SEEN    Squamous Epithelial / LPF 6-10 0 - 5    Comment: Performed at [facility], [address] [place], [place] [address]  Glucose, capillary     Status: Abnormal   Collection Time: 04/26/19 10:46 PM  Result Value Ref Range   Glucose-Capillary 68 (L) 70 - 99 mg/dL     Assessment/Plan: 1. Encounter for general adult medical examination with abnormal findings Well appearing 58 year old female, orders placed to have her caught up on preventative health measures.  2. Herpes simplex Stable on current therapy, continue to monitor. - acyclovir (ZOVIRAX) 400 MG tablet; Take 1 tablet (400 mg total) by mouth 2 (two) times daily.  Dispense: 60 tablet; Refill: 1  3. Mixed hyperlipidemia Routine labs revealed slight elevation in total cholesterol as well as slight elevation in LDL cholesterol. Discussed importance of maintaining a health diet and to avoid fried and fatty foods. Discussed statin therapy and the benefits of statin therapy. Start at low does, follow-up on lipid panel and adjust accordingly. - atorvastatin (LIPITOR) 10 MG tablet; Take 1 tablet (10 mg total) by mouth daily.  Dispense: 90 tablet; Refill: 1  4. Diabetes mellitus without complication ([place]) Stable on current therapy, will need to adjust medication at next visit in the new year per insurance plan as they will be stopping coverage of Invokamet. - Canagliflozin-metFORMIN HCl (INVOKAMET) 50-500 MG TABS; Take 1 tablet by mouth 2 (two) times daily.  Dispense: 180 tablet; Refill: 0  5. Seasonal allergies Stable on current therapy, continue to monitor. -Cetirizine 10 mg TABS; Take 1 tablet by mouth daily as needed for allergies. Dispense: 30 tablet; Refill: 3  6. Axillary hidradenitis suppurativa Stable on current prophylaxis therapy, continue until seen by dermatology next month for further recommendations. - doxycycline (VIBRA-TABS) 100 MG tablet; Take 1 tablet (  100 mg total) by mouth 2 (two) times daily.  Dispense: 60  tablet; Refill: 3  7. Long term (current) use of anticoagulants INR today at 3.3. Currently taking 1 tablet Monday-Thursday and 2 tablets Friday, Saturday and Sunday. Discussed changing therapy to take 1 tablet Monday-Friday and 2 tablets Saturday and Sunday. Will closely monitor INR levels and adjust medications accordingly. - POCT INR  8. Encounter for screening mammogram for malignant neoplasm of breast - MM DIGITAL SCREENING BILATERAL; Future  9. Dysuria - UA/M w/rflx Culture, Routine   General Counseling: Demari verbalizes understanding of the findings of todays visit and agrees with plan of treatment. I have discussed any further diagnostic evaluation that may be needed or ordered today. We also reviewed her medications today. she has been encouraged to call the office with any questions or concerns that should arise related to todays visit.   Orders Placed This Encounter  Procedures  . Microscopic Examination  . Urine Culture, Reflex  . MM DIGITAL SCREENING BILATERAL  . UA/M w/rflx Culture, Routine  . NuSwab Vaginitis Plus (VG+)  . POCT INR    Meds ordered this encounter  Medications  . doxycycline (VIBRA-TABS) 100 MG tablet    Sig: Take 1 tablet (100 mg total) by mouth 2 (two) times daily.    Dispense:  60 tablet    Refill:  3  . cetirizine (ZYRTEC ALLERGY) 10 MG tablet    Sig: Take 1 tablet (10 mg total) by mouth daily as needed for allergies.    Dispense:  30 tablet    Refill:  2  . acyclovir (ZOVIRAX) 400 MG tablet    Sig: Take 1 tablet (400 mg total) by mouth 2 (two) times daily.    Dispense:  60 tablet    Refill:  1  . atorvastatin (LIPITOR) 10 MG tablet    Sig: Take 1 tablet (10 mg total) by mouth daily.    Dispense:  90 tablet    Refill:  1  . Canagliflozin-metFORMIN HCl (INVOKAMET) 50-500 MG TABS    Sig: Take 1 tablet by mouth 2 (two) times daily.    Dispense:  180 tablet    Refill:  0    Time spent: 30 Minutes   This patient was seen by Orson Gear AGNP-C in Collaboration with Dr Lavera Guise as a part of collaborative care agreement    Kendell Bane AGNP-C Internal Medicine

## 2019-04-18 ENCOUNTER — Telehealth: Payer: Self-pay

## 2019-04-18 LAB — UA/M W/RFLX CULTURE, ROUTINE
Bilirubin, UA: NEGATIVE
Ketones, UA: NEGATIVE
Nitrite, UA: NEGATIVE
Protein,UA: NEGATIVE
Specific Gravity, UA: 1.028 (ref 1.005–1.030)
Urobilinogen, Ur: 0.2 mg/dL (ref 0.2–1.0)
pH, UA: 5 (ref 5.0–7.5)

## 2019-04-18 LAB — URINE CULTURE, REFLEX

## 2019-04-18 LAB — MICROSCOPIC EXAMINATION
Casts: NONE SEEN /lpf
Epithelial Cells (non renal): >10 /hpf — AB (ref 0–10)

## 2019-04-18 NOTE — Telephone Encounter (Signed)
Called advised patient of appointment at [facility] on 05/01/2019 @9 :45am [phone]

## 2019-04-19 ENCOUNTER — Other Ambulatory Visit: Payer: Self-pay | Admitting: Adult Health

## 2019-04-19 DIAGNOSIS — J449 Chronic obstructive pulmonary disease, unspecified: Secondary | ICD-10-CM

## 2019-04-20 LAB — NUSWAB VAGINITIS PLUS (VG+)
Atopobium vaginae: HIGH Score — AB
Candida albicans, NAA: NEGATIVE
Candida glabrata, NAA: NEGATIVE
Chlamydia trachomatis, NAA: POSITIVE — AB
Megasphaera 1: HIGH Score — AB
Neisseria gonorrhoeae, NAA: NEGATIVE
Trich vag by NAA: POSITIVE — AB

## 2019-04-21 LAB — PAP IG AND HPV HIGH-RISK: HPV, high-risk: POSITIVE — AB

## 2019-04-22 ENCOUNTER — Other Ambulatory Visit: Payer: Self-pay | Admitting: Adult Health

## 2019-04-22 MED ORDER — METRONIDAZOLE 500 MG PO TABS: 500.0000 mg | ORAL_TABLET | Freq: Two times a day (BID) | ORAL | 0 refills | Status: AC

## 2019-04-22 MED ORDER — AZITHROMYCIN 250 MG PO TABS: ORAL_TABLET | ORAL | 0 refills | Status: DC

## 2019-04-22 NOTE — Progress Notes (Signed)
Pt positive for Chlamydia, trichomonas, and Bacterial vaginosis.  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 Azithromycin and flagyl sent to patients pharmacy.

## 2019-04-23 ENCOUNTER — Other Ambulatory Visit: Payer: Self-pay

## 2019-04-23 ENCOUNTER — Telehealth: Payer: Self-pay | Admitting: Adult Health

## 2019-04-23 MED ORDER — ACYCLOVIR 5 % EX OINT: TOPICAL_OINTMENT | CUTANEOUS | 1 refills | Status: DC

## 2019-04-24 ENCOUNTER — Telehealth: Payer: Self-pay

## 2019-04-24 NOTE — Telephone Encounter (Signed)
-----   Message from Kendell Bane, NP sent at 04/22/2019  1:23 PM EST ----- Medications sent to pharmacy, please let patient know.  I will speak to her to answer questions when you have her on the phone.

## 2019-04-24 NOTE — Telephone Encounter (Signed)
Called pt to notify her that she is positive for trich and chlam and that her medications were sent over to pharmacy. Pt said she was notified yesterday and has picked up the medications. No questions.

## 2019-04-26 ENCOUNTER — Encounter: Payer: Self-pay | Admitting: Emergency Medicine

## 2019-04-26 ENCOUNTER — Other Ambulatory Visit: Payer: Self-pay

## 2019-04-26 ENCOUNTER — Emergency Department
Admission: EM | Admit: 2019-04-26 | Discharge: 2019-04-27 | Discharge status: Against Medical Advice | Payer: Medicare Other | Attending: Emergency Medicine | Admitting: Emergency Medicine

## 2019-04-26 DIAGNOSIS — R110 Nausea: Secondary | ICD-10-CM

## 2019-04-26 DIAGNOSIS — Z5320 Procedure and treatment not carried out because of patient's decision for unspecified reasons: Secondary | ICD-10-CM | POA: Diagnosis not present

## 2019-04-26 DIAGNOSIS — J449 Chronic obstructive pulmonary disease, unspecified: Secondary | ICD-10-CM | POA: Insufficient documentation

## 2019-04-26 DIAGNOSIS — Z87891 Personal history of nicotine dependence: Secondary | ICD-10-CM | POA: Diagnosis not present

## 2019-04-26 DIAGNOSIS — Z86718 Personal history of other venous thrombosis and embolism: Secondary | ICD-10-CM | POA: Diagnosis not present

## 2019-04-26 DIAGNOSIS — E119 Type 2 diabetes mellitus without complications: Secondary | ICD-10-CM | POA: Insufficient documentation

## 2019-04-26 DIAGNOSIS — Z79899 Other long term (current) drug therapy: Secondary | ICD-10-CM | POA: Diagnosis not present

## 2019-04-26 DIAGNOSIS — Z7901 Long term (current) use of anticoagulants: Secondary | ICD-10-CM | POA: Diagnosis not present

## 2019-04-26 DIAGNOSIS — I10 Essential (primary) hypertension: Secondary | ICD-10-CM | POA: Diagnosis not present

## 2019-04-26 DIAGNOSIS — Z7982 Long term (current) use of aspirin: Secondary | ICD-10-CM | POA: Diagnosis not present

## 2019-04-26 LAB — LIPASE, BLOOD: Lipase: 47 U/L (ref 11–51)

## 2019-04-26 LAB — CBC
HCT: 40.6 % (ref 36.0–46.0)
Hemoglobin: 13.5 g/dL (ref 12.0–15.0)
MCH: 30.3 pg (ref 26.0–34.0)
MCHC: 33.3 g/dL (ref 30.0–36.0)
MCV: 91 fL (ref 80.0–100.0)
Platelets: 333 10*3/uL (ref 150–400)
RBC: 4.46 MIL/uL (ref 3.87–5.11)
RDW: 14.6 % (ref 11.5–15.5)
WBC: 10.4 10*3/uL (ref 4.0–10.5)
nRBC: 0 % (ref 0.0–0.2)

## 2019-04-26 LAB — GLUCOSE, CAPILLARY: Glucose-Capillary: 68 mg/dL — ABNORMAL LOW (ref 70–99)

## 2019-04-26 LAB — URINALYSIS, COMPLETE (UACMP) WITH MICROSCOPIC
Bilirubin Urine: NEGATIVE
Glucose, UA: >=500 mg/dL — AB
Ketones, ur: NEGATIVE mg/dL
Nitrite: NEGATIVE
Protein, ur: NEGATIVE mg/dL
Specific Gravity, Urine: 1.013 (ref 1.005–1.030)
pH: 5 (ref 5.0–8.0)

## 2019-04-26 LAB — COMPREHENSIVE METABOLIC PANEL
ALT: 21 U/L (ref 0–44)
AST: 48 U/L — ABNORMAL HIGH (ref 15–41)
Albumin: 3.8 g/dL (ref 3.5–5.0)
Alkaline Phosphatase: 77 U/L (ref 38–126)
Anion gap: 12 (ref 5–15)
BUN: 36 mg/dL — ABNORMAL HIGH (ref 6–20)
CO2: 26 mmol/L (ref 22–32)
Calcium: 8.9 mg/dL (ref 8.9–10.3)
Chloride: 96 mmol/L — ABNORMAL LOW (ref 98–111)
Creatinine, Ser: 1.37 mg/dL — ABNORMAL HIGH (ref 0.44–1.00)
GFR calc Af Amer: 49 mL/min — ABNORMAL LOW (ref >60)
GFR calc non Af Amer: 42 mL/min — ABNORMAL LOW (ref >60)
Glucose, Bld: 113 mg/dL — ABNORMAL HIGH (ref 70–99)
Potassium: 3.7 mmol/L (ref 3.5–5.1)
Sodium: 134 mmol/L — ABNORMAL LOW (ref 135–145)
Total Bilirubin: 0.5 mg/dL (ref 0.3–1.2)
Total Protein: 7.9 g/dL (ref 6.5–8.1)

## 2019-04-26 MED ORDER — ONDANSETRON HCL 4 MG/2ML IJ SOLN
INTRAMUSCULAR | Status: AC
  Filled 2019-04-26: qty 2

## 2019-04-26 MED ORDER — SODIUM CHLORIDE 0.9 % IV BOLUS
1000.0000 mL | Freq: Once | INTRAVENOUS | Status: AC
  Administered 2019-04-26: 1000 mL via INTRAVENOUS

## 2019-04-26 MED ORDER — ONDANSETRON HCL 4 MG/2ML IJ SOLN
4.0000 mg | Freq: Once | INTRAMUSCULAR | Status: AC
  Administered 2019-04-26: 4 mg via INTRAVENOUS

## 2019-04-26 MED ORDER — ONDANSETRON 4 MG PO TBDP: ORAL_TABLET | ORAL | 0 refills | Status: DC

## 2019-04-26 MED ORDER — ONDANSETRON 4 MG PO TBDP
4.0000 mg | ORAL_TABLET | Freq: Once | ORAL | Status: AC | PRN
  Administered 2019-04-26: 19:00:00 4 mg via ORAL
  Filled 2019-04-26: qty 1

## 2019-04-26 NOTE — Discharge Instructions (Signed)
We believe your symptoms are caused by either a viral infection or possible a bad food exposure.  Either way, since your symptoms have improved, we feel it is safe for you to go home and follow up with your regular doctor.  Please read the included information and stick to a bland diet for the next two days.  Drink plenty of clear fluids, and if you were provided with a prescription, please take it according to the label instructions.  You were given some IV fluids which should help but it is important you try to stay hydrated.  I also strongly recommend that you check your blood sugar regularly before giving yourself insulin.  Your blood sugar dropped a little bit low tonight and it is likely because you have not been eating enough food while taking your insulin.  If you are not eating much food, you may want to decrease or even temporarily stop using the insulin until your stomach is feeling better and you are able to return to a normal diet.  If you develop any new or worsening symptoms, including persistent vomiting not controlled with medication, fever greater than 101, severe or worsening abdominal pain, or other symptoms that concern you, please return immediately to the Emergency Department.

## 2019-04-26 NOTE — ED Provider Notes (Signed)
[facility] Emergency Department Provider Note  ____________________________________________   First MD Initiated Contact with Patient 04/26/19 2304     (approximate)  I have reviewed the triage vital signs and the nursing notes.   HISTORY  Chief Complaint Nausea    HPI Brenda Santana is a 58 y.o. female with medical history as listed below who presents for evaluation of nausea.  She reports that she is been nauseated consistently for the last 2 days.  Nothing in particular makes it better or worse.  She said that it seems to be getting gradually worse and by tonight was severe.  She has had decreased oral intake as a result.  She recently was given prescriptions of azithromycin and metronidazole for diagnoses of BV, trichomoniasis, and chlamydia as an outpatient, but she says that she was feeling nauseated prior to starting the medications.  In spite of the nausea she says she has been compliant with the medication regimen.  She said that by tonight she felt lightheaded when she got up and felt "out of my head', but feels a bit better now.  She had some hypoglycemia in the ED and was able to tolerate 4 ounces of orange juice.  She has not had any contact with COVID-19 patients of which she is aware.  She denies fever/chills, sore throat, loss of smell and taste, chest pain, cough, shortness of breath, vomiting, abdominal pain, dysuria, and diarrhea.        Past Medical History:  Diagnosis Date  . Asthma   . Atopic dermatitis   . Constipation   . COPD (chronic obstructive pulmonary disease) ([place])   . Diabetes mellitus without complication ([place])   . DVT (deep venous thrombosis) ([place])   . GERD (gastroesophageal reflux disease)   . Hyperlipidemia   . Hypertension   . Rheumatoid arthritis [facility])     Patient Active Problem List   Diagnosis Date Noted  . Abscess of axilla, right 03/01/2019  . Pain in right upper arm 03/01/2019  . Type 2 diabetes  mellitus with hyperglycemia ([place]) 03/01/2019  . Long term (current) use of systemic steroids 03/01/2019  . Long term (current) use of anticoagulants 01/17/2018  . Appendicitis with perforation   . Uncontrolled type 2 diabetes mellitus with hyperglycemia ([place]) 11/09/2017  . 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 Mucopurulent chronic bronchitis ([place]) 11/09/2017  . Personal history of venous thrombosis and embolism 10/06/2017  . Phlebitis and thrombophlebitis of other sites 06/09/2017  . Obstructive sleep apnea of adult 06/09/2017  . Gout, unspecified 06/09/2017  . Herpesviral vulvovaginitis 06/09/2017  . Type 2 diabetes mellitus with diabetic neuropathy, unspecified ([place]) 06/09/2017  . Essential (primary) hypertension 06/09/2017  . Chronic anticoagulation 06/09/2017  . Pain medication agreement signed 01/05/2017  . Subacromial bursitis of left shoulder joint 10/19/2016  . Chronic shoulder bursitis, right 06/21/2016  . Chronic pain of left knee 07/14/2014  . Myofascial muscle pain 07/14/2014  . Low back pain 03/18/2014  . Chronic obstructive pulmonary disease ([place]) 05/01/2013  . Upper respiratory infection 05/01/2013  . Acne 09/12/2012  . Hand dermatitis 09/12/2012  . DVT (deep venous thrombosis) ([place]) 08/31/2012  . Constipation 08/16/2012  . Esophageal reflux disease 08/16/2012  . Asthma 04/10/2012  . Tobacco use disorder 04/02/2012  . Morbid obesity ([place]) 03/01/2012  . Dermatitis 01/11/2012  . Depressive disorder 06/22/2010  . Diabetes mellitus without complication ([place]) [id number]  . Encounter for current long-term use of anticoagulants 08/17/2009    Past Surgical History:  Procedure Laterality Date  . APPENDECTOMY    . COLONOSCOPY WITH PROPOFOL N/A 02/02/2018   Procedure: COLONOSCOPY WITH PROPOFOL;  Surgeon: Jonathon Bellows, MD;  Location: [facility] ENDOSCOPY;  Service: Gastroenterology;  Laterality: N/A;  . LAPAROSCOPIC APPENDECTOMY N/A 02/05/2018   Procedure: APPENDECTOMY LAPAROSCOPIC;   Surgeon: Jules Husbands, MD;  Location: [facility] ORS;  Service: General;  Laterality: N/A;  . right arm surgery    . TUBAL LIGATION      Prior to Admission medications   Medication Sig Start Date End Date Taking? Authorizing Provider  acyclovir (ZOVIRAX) 400 MG tablet Take 1 tablet (400 mg total) by mouth 2 (two) times daily. 04/16/19   Kendell Bane, NP  acyclovir ointment (ZOVIRAX) 5 % APPLY TOPICALLY EVERY 3 (THREE) HOURS. 04/23/19   Kendell Bane, NP  albuterol (PROAIR HFA) 108 (90 Base) MCG/ACT inhaler INHALE 2 PUFFS INTO THE LUNGS EVERY 4 (FOUR) HOURS AS NEEDED FOR WHEEZING OR SHORTNESS OF BREATH. 02/04/19   Kendell Bane, NP  allopurinol (ZYLOPRIM) 300 MG tablet TAKE ONE TAB AT NIGHT FOR GOUT 05/28/18   Kendell Bane, NP  aspirin EC 81 MG tablet Take 81 mg by mouth daily.    [provider]  atorvastatin (LIPITOR) 10 MG tablet Take 1 tablet (10 mg total) by mouth daily. 04/16/19   Kendell Bane, NP  azithromycin (ZITHROMAX) 250 MG tablet Take four tablets at one with food. 04/22/19   Kendell Bane, NP  BD PEN NEEDLE NANO U/F 32G X 4 MM MISC USE DAILY WITH VICTOZA 07/22/18   Ronnell Freshwater, NP  benazepril (LOTENSIN) 10 MG tablet Take 1 tablet (10 mg total) by mouth daily. 09/03/18   Kendell Bane, NP  Biotin 5 MG CAPS Take 5 mg by mouth daily.  08/12/14   [provider]  Canagliflozin-metFORMIN HCl (INVOKAMET) 50-500 MG TABS Take 1 tablet by mouth 2 (two) times daily. 04/16/19   Kendell Bane, NP  cetirizine (ZYRTEC ALLERGY) 10 MG tablet Take 1 tablet (10 mg total) by mouth daily as needed for allergies. 04/16/19   Kendell Bane, NP  chlorthalidone (HYGROTON) 25 MG tablet TAKE 1 TABLET BY MOUTH EVERY DAY IN THE MORNING 12/28/18   Kendell Bane, NP  clobetasol cream (TEMOVATE) [id number] % Apply 1 application topically 2 (two) times daily as needed (for eczema on hands).  08/22/13   [provider]  COMBIVENT RESPIMAT 20-100 MCG/ACT AERS respimat  INHALE 1 PUFF INTO THE LUNGS EVERY 6 (SIX) HOURS. 03/01/19   Ronnell Freshwater, NP  diclofenac sodium (VOLTAREN) 1 % GEL APPLY 4 G TOPICALLY FOUR (4) TIMES A DAY. 02/10/18   [provider]  docusate sodium (COLACE) 100 MG capsule Take 100 mg by mouth daily.     [provider]  doxycycline (VIBRA-TABS) 100 MG tablet Take 1 tablet (100 mg total) by mouth 2 (two) times daily. 04/16/19   Kendell Bane, NP  famotidine (PEPCID) 20 MG tablet Take 20 mg by mouth daily as needed for heartburn or indigestion.  09/04/13   [provider]  Ferrous Sulfate (IRON) 325 (65 Fe) MG TABS Take 325 mg by mouth 3 (three) times daily.     [provider]  gabapentin (NEURONTIN) 800 MG tablet Take 800 mg by mouth 3 (three) times daily as needed (for pain).     [provider]  ipratropium-albuterol (DUONEB) 0.5-2.5 (3) MG/3ML SOLN Take 3 mLs by nebulization every 6 (six) hours as  needed (for shortness of breath or wheezing).     [provider]  lidocaine (XYLOCAINE) 5 % ointment APPLY A SMALL AMOUNT TO AFFECTED AREA 2-3 TIMES A DAY 11/28/18   Lavera Guise, MD  LINZESS 145 MCG CAPS capsule TAKE 1 CAPSULE (145 MCG TOTAL) BY MOUTH DAILY BEFORE BREAKFAST. 02/27/19   Ronnell Freshwater, NP  metoprolol tartrate (LOPRESSOR) 50 MG tablet Take 1 tablet (50 mg total) by mouth 2 (two) times daily. 11/05/18   Kendell Bane, NP  metroNIDAZOLE (FLAGYL) 500 MG tablet Take 1 tablet (500 mg total) by mouth 2 (two) times daily for 7 days. 04/22/19 04/29/19  Kendell Bane, NP  montelukast (SINGULAIR) 10 MG tablet TAKE 1 TABLET BY MOUTH DAILY FOR ASTHMA 10/02/18   Ronnell Freshwater, NP  ondansetron (ZOFRAN ODT) 4 MG disintegrating tablet Allow 1-2 tablets to dissolve in your mouth every 8 hours as needed for nausea/vomiting 04/26/19   Hinda Kehr, MD  oxyCODONE-acetaminophen (PERCOCET/ROXICET) 5-325 MG tablet Take 1 tablet by mouth every 6 (six) hours as needed for severe pain.  03/01/19   Ronnell Freshwater, NP  oxymetazoline (AFRIN) 0.05 % nasal spray Place 1 spray into both nostrils 2 (two) times daily as needed for congestion.    [provider]  phentermine (ADIPEX-P) 37.5 MG tablet Take 1 tablet (37.5 mg total) by mouth daily before breakfast. 09/03/18   [place], Audie Clear, NP  predniSONE (DELTASONE) 5 MG tablet TAKE 2 TABLETS (10 MG TOTAL) BY MOUTH DAILY WITH BREAKFAST. 04/19/19   Lavera Guise, MD  sulfamethoxazole-trimethoprim (BACTRIM DS) 800-160 MG tablet Take 1 tablet by mouth 2 (two) times daily. 02/25/19   [place], Greer Ee, NP  tiZANidine (ZANAFLEX) 4 MG tablet TAKE 1 TABLET (4 MG TOTAL) BY MOUTH 3 (THREE) TIMES DAILY. 02/11/19   Lavera Guise, MD  traMADol (ULTRAM) 50 MG tablet Take 1 tablet (50 mg total) by mouth every 8 (eight) hours as needed for moderate pain. 08/09/18   Lavera Guise, MD  tretinoin (RETIN-A) 0.025 % cream Apply 1 application topically at bedtime as needed (for eczema on face).  09/25/17   [provider]  umeclidinium bromide (INCRUSE ELLIPTA) 62.5 MCG/INH AEPB Take one puff every day 01/01/19   Kendell Bane, NP  VICTOZA 18 MG/3ML SOPN INJECT 1.8 MG INTO THE SKIN EVERY MORNING 02/11/19   Lavera Guise, MD  warfarin (COUMADIN) 1 MG tablet Take one tab as needed with coumadin 5 mg qd 02/21/19   Lavera Guise, MD  warfarin (COUMADIN) 5 MG tablet Take a whole tablet on Monday, Tuesday, Thursday, Saturday and Sunday.  Take a 1/2 tablet on Wednesday and Friday. 01/31/19   Ronnell Freshwater, NP    Allergies Patient has no known allergies.  Family History  Problem Relation Age of Onset  . Breast cancer Mother   . Hypertension Mother   . Diabetes Mother   . Hypertension Father   . Heart failure Father     Social History Social History   Tobacco Use  . Smoking status: Former Research scientist (life sciences)  . Smokeless tobacco: Never Used  Substance Use Topics  . Alcohol use: No  . Drug use: No    Review of Systems Constitutional: No  fever/chills Eyes: No visual changes. ENT: No sore throat. Cardiovascular: Denies chest pain. Respiratory: Denies shortness of breath. Gastrointestinal: Severe nausea gradually worsening over the last couple of days.  No vomiting or diarrhea.  No abdominal pain. Genitourinary: Negative for  dysuria. Musculoskeletal: Negative for neck pain.  Negative for back pain. Integumentary: Negative for rash. Neurological: Negative for headaches, focal weakness or numbness.   ____________________________________________   PHYSICAL EXAM:  VITAL SIGNS: ED Triage Vitals  Enc Vitals Group     BP 04/26/19 1852 109/66     Pulse Rate 04/26/19 1852 (!) 116     Resp 04/26/19 1852 18     Temp 04/26/19 1852 99.1 F (37.3 C)     Temp Source 04/26/19 1852 Oral     SpO2 04/26/19 1852 99 %     Weight 04/26/19 1853 108.9 kg (240 lb)     Height 04/26/19 1853 1.651 m (5\' 5" )     Head Circumference --      Peak Flow --      Pain Score 04/26/19 1853 0     Pain Loc --      Pain Edu? --      Excl. in [place]? --     Constitutional: Alert and oriented.  Appears uncomfortable but is not in severe distress. Eyes: Conjunctivae are normal.  Head: Atraumatic. Nose: No congestion/rhinnorhea. Mouth/Throat: Patient is wearing a mask. Neck: No stridor.  No meningeal signs.   Cardiovascular: Mild tachycardia, regular rhythm. Good peripheral circulation. Grossly normal heart sounds. Respiratory: Normal respiratory effort.  No retractions. Gastrointestinal: Soft and nontender. No distention.  Musculoskeletal: No lower extremity tenderness nor edema. No gross deformities of extremities. Neurologic:  Normal speech and language. No gross focal neurologic deficits are appreciated.  Skin:  Skin is warm, dry and intact. Psychiatric: Mood and affect are normal. Speech and behavior are normal.  ____________________________________________   LABS (all labs ordered are listed, but only abnormal results are displayed)  Labs  Reviewed  COMPREHENSIVE METABOLIC PANEL - Abnormal; Notable for the following components:      Result Value   Sodium 134 (*)    Chloride 96 (*)    Glucose, Bld 113 (*)    BUN 36 (*)    Creatinine, Ser 1.37 (*)    AST 48 (*)    GFR calc non Af Amer 42 (*)    GFR calc Af Amer 49 (*)    All other components within normal limits  URINALYSIS, COMPLETE (UACMP) WITH MICROSCOPIC - Abnormal; Notable for the following components:   Color, Urine YELLOW (*)    APPearance HAZY (*)    Glucose, UA >=500 (*)    Hgb urine dipstick SMALL (*)    Leukocytes,Ua SMALL (*)    Bacteria, UA RARE (*)    All other components within normal limits  GLUCOSE, CAPILLARY - Abnormal; Notable for the following components:   Glucose-Capillary 68 (*)    All other components within normal limits  LIPASE, BLOOD  CBC   ____________________________________________  EKG  No indication for EKG ____________________________________________  RADIOLOGY I, Hinda Kehr, personally viewed and evaluated these images (plain radiographs) as part of my medical decision making, as well as reviewing the written report by the radiologist.  ED MD interpretation: No indication for emergent imaging  Official radiology report(s): No results found.  ____________________________________________   PROCEDURES   Procedure(s) performed (including Critical Care):  Procedures   ____________________________________________   INITIAL IMPRESSION / MDM / [place] / ED COURSE  As part of my medical decision making, I reviewed the following data within the [place] notes reviewed and incorporated, Labs reviewed , Old chart reviewed and Notes from prior ED visits   Differential diagnosis  includes, but is not limited to, viral illness, medication side effect, metabolic or electrolyte abnormality, SBO/ileus, gastritis, GERD.  Patient is well-appearing although she does appear uncomfortable at  this time.  She has a reassuring physical exam with no tenderness to palpation of her abdomen in any quadrant.  Her lab work is generally reassuring although she seems to be mildly dehydrated with a degree of acute kidney injury which is likely secondary to volume depletion given her recent nausea.  There are no other significant abnormalities although she had some mild hypoglycemia on a fingerstick blood sugar that occurred multiple hours after the initial metabolic panel.  Anion gap was normal.  She was able to tolerate 4 ounces of orange juice without difficulty which is reassuring.  No leukocytosis.  No other infectious signs or symptoms.  Urinalysis demonstrates glucosuria but without any evidence of infection and she has not had any dysuria.  I talked to the patient about her symptoms and her reassuring work-up and I recommended we finish a liter of normal saline and then discharge her home for outpatient follow-up with a prescription for Zofran.  She has already received 1 dose of Zofran 4 mg IV and 1 Zofran ODT.  She is comfortable with this plan.  I gave my usual customary return precautions.       Clinical Course as of Apr 26 5  Sat Apr 27, 2019  0002 Reportedly the patient became irate for unclear reasons and asked to have her IV removed and to leave the ED prior to the administration of her fluids.  Her nurse signed her out [place] given that she refused to stay to finish the fluids.  However I had already prepared her paperwork and her prescription has already been sent electronically to her pharmacy.   [CF]    Clinical Course User Index [CF] Hinda Kehr, MD     ____________________________________________  FINAL CLINICAL IMPRESSION(S) / ED DIAGNOSES  Final diagnoses:  Nausea     MEDICATIONS GIVEN DURING THIS VISIT:  Medications  ondansetron (ZOFRAN-ODT) disintegrating tablet 4 mg (4 mg Oral Given 04/26/19 1905)  sodium chloride 0.9 % bolus 1,000 mL  (1,000 mLs Intravenous New Bag/Given 04/26/19 2243)  ondansetron (ZOFRAN) injection 4 mg (4 mg Intravenous Given 04/26/19 2256)     ED Discharge Orders         Ordered    ondansetron (ZOFRAN ODT) 4 MG disintegrating tablet     04/26/19 2332          *Please note:  RONELLE STACHE was evaluated in Emergency Department on 04/27/2019 for the symptoms described in the history of present illness. She was evaluated in the context of the global COVID-19 pandemic, which necessitated consideration that the patient might be at risk for infection with the SARS-CoV-2 virus that causes COVID-19. Institutional protocols and algorithms that pertain to the evaluation of patients at risk for COVID-19 are in a state of rapid change based on information released by regulatory bodies including the CDC and federal and state organizations. These policies and algorithms were followed during the patient's care in the ED.  Some ED evaluations and interventions may be delayed as a result of limited staffing during the pandemic.*  Note:  This document was prepared using Dragon voice recognition software and may include unintentional dictation errors.   Hinda Kehr, MD 04/27/19 [id number]

## 2019-04-26 NOTE — ED Notes (Signed)
OJ 4 oz given to pt.

## 2019-04-26 NOTE — ED Notes (Signed)
MD Jari Pigg made ware CBG 68

## 2019-04-26 NOTE — ED Notes (Signed)
Pt st taking insulin at 0800 today. Pt unable to eat due to feeling "sick and nauseous".

## 2019-04-26 NOTE — ED Triage Notes (Signed)
Pt in via [facility] from home, reports nausea x 2 days, denies any emesis/diarrhea.  NAD noted at this time.

## 2019-04-27 NOTE — ED Notes (Signed)
Pt given d/c papers. Pt asking to be pushed to the lobby in a wheelchair. Tech called to take pt to lobby.

## 2019-04-27 NOTE — ED Notes (Signed)
This Rn in to check on pt. Pt is laying in bed I no acute distress. Asked pt if she needed assistance. Pt states "I ain't never coming back to this hospital. I wanted someone to check on my IV and make sure my fluids are going in but nobody came. I am cold and ready to go . I can go home and be ignored." Apologized to the pt that we are very busy and what would she like for this RN to do as I would help facilitate her decision. Pt sates she is ready to go. IV d/c. Fluids were not completed. Pt signed out AMA. Told pt she could now leave. Pt states "I still want my medicine." Advised pt that she refused her course of treatment and asked me to let her go. Pt now states "Let's try this again. I want my medicine, you can bring me a blanket and then I will leave." MD aware.

## 2019-04-27 NOTE — ED Notes (Signed)
Pt rang call bell. This Rn notified by ed Network engineer. Asked to send someone as this RN is tied up with another pt.

## 2019-04-29 ENCOUNTER — Ambulatory Visit: Admission: RE | Admit: 2019-04-29 | Payer: Medicare Other | Source: Ambulatory Visit

## 2019-04-30 ENCOUNTER — Other Ambulatory Visit: Payer: Self-pay

## 2019-04-30 ENCOUNTER — Other Ambulatory Visit: Payer: Self-pay | Admitting: Adult Health

## 2019-05-02 NOTE — Telephone Encounter (Signed)
Patient has been informed. Brenda Santana

## 2019-05-08 ENCOUNTER — Other Ambulatory Visit: Payer: Self-pay

## 2019-05-08 ENCOUNTER — Telehealth: Payer: Self-pay

## 2019-05-08 MED ORDER — MONTELUKAST SODIUM 10 MG PO TABS: ORAL_TABLET | ORAL | 1 refills | Status: DC

## 2019-05-08 NOTE — Telephone Encounter (Signed)
SHE[place], NURSING LIASION FROM [facility], CALLED TO GET VERBAL OK FOR PT TO GET ENHANCED HOME HEALTH POST HOSP VISIT FOR COVID. THIS WILL PROVIDE PATIENT WITH PHYSICAL THERAPY, OCCUPATIONAL THERAPY, AND NURSING. PT WILL HAVE A VIRTUAL VISIT WITHIN 18 HOURS AFTER BEING DISCHARGED IN SEVERAL DAYS. THEY WILL BE DOING VIRTUAL AND IN HOUSE VISITS AND WILL TOUCH BASE WITH PATIENT 7-10 TIMES IN THE 1ST WEEK AFTER BEING DISCHARGED AND WILL HAVE A VIRTUAL VISIT WITH A PATIENT WITHIN 36 HOURS AFTER DISCHARGE.

## 2019-05-10 ENCOUNTER — Other Ambulatory Visit: Payer: Self-pay | Admitting: Internal Medicine

## 2019-05-10 DIAGNOSIS — B009 Herpesviral infection, unspecified: Secondary | ICD-10-CM

## 2019-05-11 MED ORDER — GABAPENTIN 400 MG PO CAPS
800.00 | ORAL_CAPSULE | ORAL | Status: DC
Start: 2019-05-11 — End: 2019-05-11

## 2019-05-11 MED ORDER — ARFORMOTEROL TARTRATE 15 MCG/2ML IN NEBU
15.00 | INHALATION_SOLUTION | RESPIRATORY_TRACT | Status: DC
Start: 2019-05-11 — End: 2019-05-11

## 2019-05-11 MED ORDER — INSULIN NPH (HUMAN) (ISOPHANE) 100 UNIT/ML ~~LOC~~ SUSP
20.00 | SUBCUTANEOUS | Status: DC
Start: 2019-05-12 — End: 2019-05-11

## 2019-05-11 MED ORDER — INSULIN LISPRO 100 UNIT/ML ~~LOC~~ SOLN
0.00 | SUBCUTANEOUS | Status: DC
Start: 2019-05-11 — End: 2019-05-11

## 2019-05-11 MED ORDER — INSULIN NPH (HUMAN) (ISOPHANE) 100 UNIT/ML ~~LOC~~ SUSP
8.00 | SUBCUTANEOUS | Status: DC
Start: 2019-05-11 — End: 2019-05-11

## 2019-05-11 MED ORDER — TRAMADOL HCL 50 MG PO TABS
50.00 | ORAL_TABLET | ORAL | Status: DC
Start: ? — End: 2019-05-11

## 2019-05-11 MED ORDER — BUDESONIDE 0.25 MG/2ML IN SUSP
0.25 | RESPIRATORY_TRACT | Status: DC
Start: 2019-05-12 — End: 2019-05-11

## 2019-05-11 MED ORDER — DEXTROSE 50 % IV SOLN
12.50 | INTRAVENOUS | Status: DC
Start: ? — End: 2019-05-11

## 2019-05-11 MED ORDER — MORPHINE SULFATE 2 MG/ML IJ SOLN
2.00 | INTRAMUSCULAR | Status: DC
Start: ? — End: 2019-05-11

## 2019-05-11 MED ORDER — LIDOCAINE 5 % EX PTCH
2.00 | MEDICATED_PATCH | CUTANEOUS | Status: DC
Start: 2019-05-11 — End: 2019-05-11

## 2019-05-11 MED ORDER — CETIRIZINE HCL 10 MG PO TABS
10.00 | ORAL_TABLET | ORAL | Status: DC
Start: 2019-05-12 — End: 2019-05-11

## 2019-05-11 MED ORDER — CHLORTHALIDONE 25 MG PO TABS
25.00 | ORAL_TABLET | ORAL | Status: DC
Start: 2019-05-12 — End: 2019-05-11

## 2019-05-11 MED ORDER — NASAL SPRAY 0.05 % NA SOLN
3.00 | NASAL | Status: DC
Start: ? — End: 2019-05-11

## 2019-05-11 MED ORDER — SALINE NASAL SPRAY 0.65 % NA SOLN
1.00 | NASAL | Status: DC
Start: ? — End: 2019-05-11

## 2019-05-11 MED ORDER — IPRATROPIUM-ALBUTEROL 0.5-2.5 (3) MG/3ML IN SOLN
3.00 | RESPIRATORY_TRACT | Status: DC
Start: 2019-05-11 — End: 2019-05-11

## 2019-05-11 MED ORDER — METOPROLOL TARTRATE 50 MG PO TABS
50.00 | ORAL_TABLET | ORAL | Status: DC
Start: 2019-05-11 — End: 2019-05-11

## 2019-05-11 MED ORDER — PREDNISONE 10 MG PO TABS
10.00 | ORAL_TABLET | ORAL | Status: DC
Start: 2019-05-12 — End: 2019-05-11

## 2019-05-11 MED ORDER — MONTELUKAST SODIUM 10 MG PO TABS
10.00 | ORAL_TABLET | ORAL | Status: DC
Start: 2019-05-11 — End: 2019-05-11

## 2019-05-11 MED ORDER — ATORVASTATIN CALCIUM 10 MG PO TABS
10.00 | ORAL_TABLET | ORAL | Status: DC
Start: 2019-05-12 — End: 2019-05-11

## 2019-05-11 MED ORDER — DIPHENHYDRAMINE HCL 25 MG PO CAPS
25.00 | ORAL_CAPSULE | ORAL | Status: DC
Start: ? — End: 2019-05-11

## 2019-05-11 MED ORDER — MENTHOL 9.1 MG MT LOZG
1.00 | LOZENGE | OROMUCOSAL | Status: DC
Start: ? — End: 2019-05-11

## 2019-05-11 MED ORDER — ALBUTEROL SULFATE HFA 108 (90 BASE) MCG/ACT IN AERS
2.00 | INHALATION_SPRAY | RESPIRATORY_TRACT | Status: DC
Start: ? — End: 2019-05-11

## 2019-05-11 MED ORDER — ASPIRIN 81 MG PO CHEW
81.00 | CHEWABLE_TABLET | ORAL | Status: DC
Start: 2019-05-12 — End: 2019-05-11

## 2019-05-11 MED ORDER — ACETAMINOPHEN 500 MG PO TABS
1000.00 | ORAL_TABLET | ORAL | Status: DC
Start: 2019-05-11 — End: 2019-05-11

## 2019-05-11 MED ORDER — INSULIN LISPRO 100 UNIT/ML ~~LOC~~ SOLN
22.00 | SUBCUTANEOUS | Status: DC
Start: 2019-05-11 — End: 2019-05-11

## 2019-05-11 MED ORDER — FAMOTIDINE 20 MG PO TABS
20.00 | ORAL_TABLET | ORAL | Status: DC
Start: 2019-05-11 — End: 2019-05-11

## 2019-05-14 ENCOUNTER — Other Ambulatory Visit: Payer: Self-pay

## 2019-05-14 MED ORDER — ACCU-CHEK GUIDE VI STRP: 1.0000 | ORAL_STRIP | Freq: Every day | 1 refills | Status: DC

## 2019-05-14 MED ORDER — ACCU-CHEK GUIDE W/DEVICE KIT: 1.0000 | PACK | Freq: Once | 0 refills | Status: AC

## 2019-05-14 MED ORDER — ACCU-CHEK FASTCLIX LANCETS MISC: 3 refills | Status: DC

## 2019-05-16 ENCOUNTER — Ambulatory Visit: Payer: Medicare Other | Admitting: Adult Health

## 2019-05-17 ENCOUNTER — Telehealth: Payer: Self-pay

## 2019-05-17 NOTE — Telephone Encounter (Signed)
Called lmom informing patient of virtual appointment. klh 

## 2019-05-21 ENCOUNTER — Ambulatory Visit (INDEPENDENT_AMBULATORY_CARE_PROVIDER_SITE_OTHER): Payer: Medicare Other | Admitting: Adult Health

## 2019-05-21 ENCOUNTER — Encounter: Payer: Self-pay | Admitting: Adult Health

## 2019-05-21 DIAGNOSIS — Z1231 Encounter for screening mammogram for malignant neoplasm of breast: Secondary | ICD-10-CM | POA: Diagnosis not present

## 2019-05-21 DIAGNOSIS — J302 Other seasonal allergic rhinitis: Secondary | ICD-10-CM

## 2019-05-21 DIAGNOSIS — I10 Essential (primary) hypertension: Secondary | ICD-10-CM

## 2019-05-21 DIAGNOSIS — E119 Type 2 diabetes mellitus without complications: Secondary | ICD-10-CM

## 2019-05-21 NOTE — Progress Notes (Signed)
[facility] [place], [place] [address]  Internal MEDICINE  Telephone Visit  Patient Name: Brenda Santana  [id number]  [id number]  Date of Service: 05/21/2019  I connected with the patient at 1107 by telephone and verified the patients identity using two identifiers.   I discussed the limitations, risks, security and privacy concerns of performing an evaluation and management service by telephone and the availability of in person appointments. I also discussed with the patient that there may be a patient responsible charge related to the service.  The patient expressed understanding and agrees to proceed.    Chief Complaint  Patient presents with  . Telephone Assessment    glucose  134   . Telephone Screen  . Fatigue  . Quality Metric Gaps    diabetic eye exam   . Referral    mammogram    HPI  PT seen via video. Pt reports overall she is doing well.  Her blood sugar has been good, it was 134 mg/dl this morning.  She reports yesterday it was 104.  She is due for an A1C, however we can not do this due to virtual visit today.    Needs diabetic eye exam and mammogram to close quality metric gaps.  She reports she has scheduled this appt for eye exam.  Mammogram will be ordered at this time.   BP is well controlled per patient, Denies Chest pain, Shortness of breath, palpitations, headache, or blurred vision.  Current Medication: Outpatient Encounter Medications as of 05/21/2019  Medication Sig Note  . Accu-Chek FastClix Lancets MISC Use as directed once a daily diag E11.9   . acyclovir (ZOVIRAX) 400 MG tablet TAKE 1 TABLET BY MOUTH TWICE A DAY   . acyclovir ointment (ZOVIRAX) 5 % APPLY TOPICALLY EVERY 3 (THREE) HOURS.   Marland Kitchen albuterol (PROAIR HFA) 108 (90 Base) MCG/ACT inhaler INHALE 2 PUFFS INTO THE LUNGS EVERY 4 (FOUR) HOURS AS NEEDED FOR WHEEZING OR SHORTNESS OF BREATH.   Marland Kitchen allopurinol (ZYLOPRIM) 300 MG tablet TAKE ONE TAB AT NIGHT FOR GOUT   . aspirin EC 81  MG tablet Take 81 mg by mouth daily.   Marland Kitchen atorvastatin (LIPITOR) 10 MG tablet Take 1 tablet (10 mg total) by mouth daily.   Marland Kitchen azithromycin (ZITHROMAX) 250 MG tablet Take four tablets at one with food.   . BD PEN NEEDLE NANO U/F 32G X 4 MM MISC USE DAILY WITH VICTOZA   . benazepril (LOTENSIN) 10 MG tablet Take 1 tablet (10 mg total) by mouth daily.   . Biotin 5 MG CAPS Take 5 mg by mouth daily.    . Canagliflozin-metFORMIN HCl (INVOKAMET) 50-500 MG TABS Take 1 tablet by mouth 2 (two) times daily.   . cetirizine (ZYRTEC ALLERGY) 10 MG tablet Take 1 tablet (10 mg total) by mouth daily as needed for allergies.   . chlorthalidone (HYGROTON) 25 MG tablet TAKE 1 TABLET BY MOUTH EVERY DAY IN THE MORNING   . clobetasol cream (TEMOVATE) [id number] % Apply 1 application topically 2 (two) times daily as needed (for eczema on hands).    . COMBIVENT RESPIMAT 20-100 MCG/ACT AERS respimat INHALE 1 PUFF INTO THE LUNGS EVERY 6 (SIX) HOURS.   Marland Kitchen diclofenac sodium (VOLTAREN) 1 % GEL APPLY 4 G TOPICALLY FOUR (4) TIMES A DAY.   Marland Kitchen docusate sodium (COLACE) 100 MG capsule Take 100 mg by mouth daily.    Marland Kitchen doxycycline (VIBRA-TABS) 100 MG tablet Take 1 tablet (100 mg total) by  mouth 2 (two) times daily.   . famotidine (PEPCID) 20 MG tablet Take 20 mg by mouth daily as needed for heartburn or indigestion.    . Ferrous Sulfate (IRON) 325 (65 Fe) MG TABS Take 325 mg by mouth 3 (three) times daily.  01/31/2018: Stopped for procedure  . gabapentin (NEURONTIN) 800 MG tablet Take 800 mg by mouth 3 (three) times daily as needed (for pain).    Marland Kitchen glucose blood (ACCU-CHEK GUIDE) test strip 1 each by Other route daily. Use as instructed  Once daily diag e11.9   . ipratropium-albuterol (DUONEB) 0.5-2.5 (3) MG/3ML SOLN Take 3 mLs by nebulization every 6 (six) hours as needed (for shortness of breath or wheezing).    Marland Kitchen lidocaine (XYLOCAINE) 5 % ointment APPLY A SMALL AMOUNT TO AFFECTED AREA 2-3 TIMES A DAY   . LINZESS 145 MCG CAPS capsule TAKE 1  CAPSULE (145 MCG TOTAL) BY MOUTH DAILY BEFORE BREAKFAST.   . metoprolol tartrate (LOPRESSOR) 50 MG tablet Take 1 tablet (50 mg total) by mouth 2 (two) times daily.   . montelukast (SINGULAIR) 10 MG tablet TAKE 1 TABLET BY MOUTH DAILY FOR ASTHMA   . ondansetron (ZOFRAN ODT) 4 MG disintegrating tablet Allow 1-2 tablets to dissolve in your mouth every 8 hours as needed for nausea/vomiting   . oxyCODONE-acetaminophen (PERCOCET/ROXICET) 5-325 MG tablet Take 1 tablet by mouth every 6 (six) hours as needed for severe pain.   Marland Kitchen oxymetazoline (AFRIN) 0.05 % nasal spray Place 1 spray into both nostrils 2 (two) times daily as needed for congestion.   . phentermine (ADIPEX-P) 37.5 MG tablet Take 1 tablet (37.5 mg total) by mouth daily before breakfast.   . predniSONE (DELTASONE) 5 MG tablet TAKE 2 TABLETS (10 MG TOTAL) BY MOUTH DAILY WITH BREAKFAST.   Marland Kitchen sulfamethoxazole-trimethoprim (BACTRIM DS) 800-160 MG tablet Take 1 tablet by mouth 2 (two) times daily.   Marland Kitchen tiZANidine (ZANAFLEX) 4 MG tablet TAKE 1 TABLET (4 MG TOTAL) BY MOUTH 3 (THREE) TIMES DAILY.   . traMADol (ULTRAM) 50 MG tablet Take 1 tablet (50 mg total) by mouth every 8 (eight) hours as needed for moderate pain.   Marland Kitchen tretinoin (RETIN-A) 0.025 % cream Apply 1 application topically at bedtime as needed (for eczema on face).    Marland Kitchen umeclidinium bromide (INCRUSE ELLIPTA) 62.5 MCG/INH AEPB Take one puff every day   . VICTOZA 18 MG/3ML SOPN INJECT 1.8 MG INTO THE SKIN EVERY MORNING   . warfarin (COUMADIN) 1 MG tablet Take one tab as needed with coumadin 5 mg qd   . warfarin (COUMADIN) 5 MG tablet Take a whole tablet on Monday, Tuesday, Thursday, Saturday and Sunday.  Take a 1/2 tablet on Wednesday and Friday.    No facility-administered encounter medications on file as of 05/21/2019.    Surgical History: Past Surgical History:  Procedure Laterality Date  . APPENDECTOMY    . COLONOSCOPY WITH PROPOFOL N/A 02/02/2018   Procedure: COLONOSCOPY WITH PROPOFOL;   Surgeon: Jonathon Bellows, MD;  Location: [facility] ENDOSCOPY;  Service: Gastroenterology;  Laterality: N/A;  . LAPAROSCOPIC APPENDECTOMY N/A 02/05/2018   Procedure: APPENDECTOMY LAPAROSCOPIC;  Surgeon: Jules Husbands, MD;  Location: [facility] ORS;  Service: General;  Laterality: N/A;  . right arm surgery    . TUBAL LIGATION      Medical History: Past Medical History:  Diagnosis Date  . Asthma   . Atopic dermatitis   . Constipation   . COPD (chronic obstructive pulmonary disease) ([place])   . Diabetes  mellitus without complication ([place])   . DVT (deep venous thrombosis) ([place])   . GERD (gastroesophageal reflux disease)   . Hyperlipidemia   . Hypertension   . Rheumatoid arthritis ([place])     Family History: Family History  Problem Relation Age of Onset  . Breast cancer Mother   . Hypertension Mother   . Diabetes Mother   . Hypertension Father   . Heart failure Father     Social History   Socioeconomic History  . Marital status: Single    Spouse name: Not on file  . Number of children: Not on file  . Years of education: Not on file  . Highest education level: Not on file  Occupational History  . Not on file  Tobacco Use  . Smoking status: Former Research scientist (life sciences)  . Smokeless tobacco: Never Used  Substance and Sexual Activity  . Alcohol use: No  . Drug use: No  . Sexual activity: Yes  Other Topics Concern  . Not on file  Social History Narrative  . Not on file   Social Determinants of Health   Financial Resource Strain:   . Difficulty of Paying Living Expenses: Not on file  Food Insecurity:   . Worried About Charity fundraiser in the Last Year: Not on file  . Ran Out of Food in the Last Year: Not on file  Transportation Needs:   . Lack of Transportation (Medical): Not on file  . Lack of Transportation (Non-Medical): Not on file  Physical Activity:   . Days of Exercise per Week: Not on file  . Minutes of Exercise per Session: Not on file  Stress:   . Feeling of Stress : Not on file   Social Connections:   . Frequency of Communication with Friends and Family: Not on file  . Frequency of Social Gatherings with Friends and Family: Not on file  . Attends Religious Services: Not on file  . Active Member of Clubs or Organizations: Not on file  . Attends Archivist Meetings: Not on file  . Marital Status: Not on file  Intimate Partner Violence:   . Fear of Current or Ex-Partner: Not on file  . Emotionally Abused: Not on file  . Physically Abused: Not on file  . Sexually Abused: Not on file      Review of Systems  Constitutional: Negative for chills, fatigue and unexpected weight change.  HENT: Negative for congestion, rhinorrhea, sneezing and sore throat.   Eyes: Negative for photophobia, pain and redness.  Respiratory: Negative for cough, chest tightness and shortness of breath.   Cardiovascular: Negative for chest pain and palpitations.  Gastrointestinal: Negative for abdominal pain, constipation, diarrhea, nausea and vomiting.  Endocrine: Negative.   Genitourinary: Negative for dysuria and frequency.  Musculoskeletal: Negative for arthralgias, back pain, joint swelling and neck pain.  Skin: Negative for rash.  Allergic/Immunologic: Negative.   Neurological: Negative for tremors and numbness.  Hematological: Negative for adenopathy. Does not bruise/bleed easily.  Psychiatric/Behavioral: Negative for behavioral problems and sleep disturbance. The patient is not nervous/anxious.     Vital Signs: There were no vitals taken for this visit.   Observation/Objective:  Well appearing, NAD noted   Assessment/Plan: 1. Diabetes mellitus without complication ([place]) Blood sugars are improved, will get a1c in office at next visit.   2. Essential (primary) hypertension Controlled per patient, continue to monitor.  3. Seasonal allergies Stable, continue present therapy.  4. Encounter for screening mammogram for malignant neoplasm of breast -  MM 3D  SCREEN BREAST BILATERAL; Future  General Counseling: Brenda Santana understanding of the findings of today's phone visit and agrees with plan of treatment. I have discussed any further diagnostic evaluation that may be needed or ordered today. We also reviewed her medications today. she has been encouraged to call the office with any questions or concerns that should arise related to todays visit.    No orders of the defined types were placed in this encounter.   No orders of the defined types were placed in this encounter.   Time spent: 28 Minutes    Dr Lavera Guise Internal medicine

## 2019-05-22 ENCOUNTER — Other Ambulatory Visit: Payer: Self-pay | Admitting: Internal Medicine

## 2019-05-23 ENCOUNTER — Ambulatory Visit: Payer: Medicare Other | Admitting: Internal Medicine

## 2019-05-24 ENCOUNTER — Other Ambulatory Visit: Payer: Self-pay | Admitting: Adult Health

## 2019-05-27 ENCOUNTER — Ambulatory Visit
Admission: RE | Admit: 2019-05-27 | Discharge: 2019-05-27 | Disposition: A | Discharge status: Home / Self Care | Payer: Medicare Other | Source: Ambulatory Visit | Attending: Internal Medicine | Admitting: Internal Medicine

## 2019-05-27 ENCOUNTER — Other Ambulatory Visit: Payer: Self-pay

## 2019-05-27 DIAGNOSIS — R0602 Shortness of breath: Secondary | ICD-10-CM | POA: Diagnosis present

## 2019-05-27 DIAGNOSIS — J411 Mucopurulent chronic bronchitis: Secondary | ICD-10-CM | POA: Insufficient documentation

## 2019-05-27 DIAGNOSIS — R05 Cough: Secondary | ICD-10-CM | POA: Diagnosis not present

## 2019-05-27 DIAGNOSIS — R059 Cough, unspecified: Secondary | ICD-10-CM

## 2019-05-27 HISTORY — DX: Systemic involvement of connective tissue, unspecified: M35.9

## 2019-05-27 MED ORDER — IOHEXOL 300 MG/ML  SOLN
60.0000 mL | Freq: Once | INTRAMUSCULAR | Status: AC | PRN
  Administered 2019-05-27: 60 mL via INTRAVENOUS

## 2019-05-27 NOTE — Telephone Encounter (Signed)
Lmom on both pt numbers to call us back.

## 2019-05-27 NOTE — Telephone Encounter (Signed)
Can you find out from pt if she is on it

## 2019-05-28 NOTE — Progress Notes (Signed)
CT reviewed and will be discussed on next follow up, work up needed for atherosclerosis

## 2019-05-30 ENCOUNTER — Telehealth: Payer: Self-pay

## 2019-05-30 NOTE — Telephone Encounter (Signed)
Confirmed virtual visit with patient. klh 

## 2019-06-03 ENCOUNTER — Other Ambulatory Visit: Payer: Self-pay | Admitting: Internal Medicine

## 2019-06-03 ENCOUNTER — Ambulatory Visit (INDEPENDENT_AMBULATORY_CARE_PROVIDER_SITE_OTHER): Payer: Medicare Other | Admitting: Internal Medicine

## 2019-06-03 ENCOUNTER — Encounter: Payer: Self-pay | Admitting: Internal Medicine

## 2019-06-03 DIAGNOSIS — J449 Chronic obstructive pulmonary disease, unspecified: Secondary | ICD-10-CM | POA: Diagnosis not present

## 2019-06-03 DIAGNOSIS — I10 Essential (primary) hypertension: Secondary | ICD-10-CM

## 2019-06-03 DIAGNOSIS — E119 Type 2 diabetes mellitus without complications: Secondary | ICD-10-CM

## 2019-06-03 DIAGNOSIS — U071 COVID-19: Secondary | ICD-10-CM

## 2019-06-03 DIAGNOSIS — I700 Atherosclerosis of aorta: Secondary | ICD-10-CM | POA: Diagnosis not present

## 2019-06-03 NOTE — Progress Notes (Signed)
[facility] [place], [place] [address]  Internal MEDICINE  Telephone Visit  Patient Name: Brenda Santana  [id number]  [id number]  Date of Service: 06/03/2019  I connected with the patient at  1102 by telephone and verified the patients identity using two identifiers.   I discussed the limitations, risks, security and privacy concerns of performing an evaluation and management service by telephone and the availability of in person appointments. I also discussed with the patient that there may be a patient responsible charge related to the service.  The patient expressed understanding and agrees to proceed.    Chief Complaint  Patient presents with  . Telephone Assessment  . Telephone Screen  . COPD  . Follow-up    ct    HPI  PT is seen today via video.  We are reviewing her chest ct.  See report below.  She continues to report her lungs are doing well.  She is using her inhalers and nebulizer as discussed.  She is ready to return to work and is asking for a note for her job.  Her blood pressure is well controlled.She Denies Chest pain, Shortness of breath, palpitations, headache, or blurred vision.    Current Medication: Outpatient Encounter Medications as of 06/03/2019  Medication Sig Note  . Accu-Chek FastClix Lancets MISC Use as directed once a daily diag E11.9   . acyclovir (ZOVIRAX) 400 MG tablet TAKE 1 TABLET BY MOUTH TWICE A DAY   . acyclovir ointment (ZOVIRAX) 5 % APPLY TOPICALLY EVERY 3 (THREE) HOURS.   Marland Kitchen albuterol (PROAIR HFA) 108 (90 Base) MCG/ACT inhaler INHALE 2 PUFFS INTO THE LUNGS EVERY 4 (FOUR) HOURS AS NEEDED FOR WHEEZING OR SHORTNESS OF BREATH.   Marland Kitchen allopurinol (ZYLOPRIM) 300 MG tablet TAKE ONE TAB AT NIGHT FOR GOUT   . aspirin EC 81 MG tablet Take 81 mg by mouth daily.   Marland Kitchen atorvastatin (LIPITOR) 10 MG tablet Take 1 tablet (10 mg total) by mouth daily.   Marland Kitchen azithromycin (ZITHROMAX) 250 MG tablet Take four tablets at one with food.   . BD PEN  NEEDLE NANO U/F 32G X 4 MM MISC USE DAILY WITH VICTOZA   . benazepril (LOTENSIN) 10 MG tablet Take 1 tablet (10 mg total) by mouth daily.   . Biotin 5 MG CAPS Take 5 mg by mouth daily.    . Canagliflozin-metFORMIN HCl (INVOKAMET) 50-500 MG TABS Take 1 tablet by mouth 2 (two) times daily.   . cetirizine (ZYRTEC ALLERGY) 10 MG tablet Take 1 tablet (10 mg total) by mouth daily as needed for allergies.   . chlorthalidone (HYGROTON) 25 MG tablet TAKE 1 TABLET BY MOUTH EVERY DAY IN THE MORNING   . clobetasol cream (TEMOVATE) [id number] % Apply 1 application topically 2 (two) times daily as needed (for eczema on hands).    . COMBIVENT RESPIMAT 20-100 MCG/ACT AERS respimat INHALE 1 PUFF INTO THE LUNGS EVERY 6 (SIX) HOURS.   Marland Kitchen diclofenac sodium (VOLTAREN) 1 % GEL APPLY 4 G TOPICALLY FOUR (4) TIMES A DAY.   Marland Kitchen docusate sodium (COLACE) 100 MG capsule Take 100 mg by mouth daily.    Marland Kitchen doxycycline (VIBRA-TABS) 100 MG tablet Take 1 tablet (100 mg total) by mouth 2 (two) times daily.   . famotidine (PEPCID) 20 MG tablet Take 20 mg by mouth daily as needed for heartburn or indigestion.    . Ferrous Sulfate (IRON) 325 (65 Fe) MG TABS Take 325 mg by mouth 3 (three) times  daily.  01/31/2018: Stopped for procedure  . gabapentin (NEURONTIN) 800 MG tablet Take 800 mg by mouth 3 (three) times daily as needed (for pain).    Marland Kitchen glucose blood (ACCU-CHEK GUIDE) test strip 1 each by Other route daily. Use as instructed  Once daily diag e11.9   . ipratropium-albuterol (DUONEB) 0.5-2.5 (3) MG/3ML SOLN Take 3 mLs by nebulization every 6 (six) hours as needed (for shortness of breath or wheezing).    Marland Kitchen lidocaine (XYLOCAINE) 5 % ointment APPLY A SMALL AMOUNT TO AFFECTED AREA 2-3 TIMES A DAY   . LINZESS 145 MCG CAPS capsule TAKE 1 CAPSULE (145 MCG TOTAL) BY MOUTH DAILY BEFORE BREAKFAST.   Marland Kitchen lisinopril (ZESTRIL) 10 MG tablet TAKE 1 TAB DAILY IN MORNING   . metoprolol tartrate (LOPRESSOR) 50 MG tablet Take 1 tablet (50 mg total) by mouth 2  (two) times daily.   . montelukast (SINGULAIR) 10 MG tablet TAKE 1 TABLET BY MOUTH DAILY FOR ASTHMA   . ondansetron (ZOFRAN ODT) 4 MG disintegrating tablet Allow 1-2 tablets to dissolve in your mouth every 8 hours as needed for nausea/vomiting   . oxyCODONE-acetaminophen (PERCOCET/ROXICET) 5-325 MG tablet Take 1 tablet by mouth every 6 (six) hours as needed for severe pain.   Marland Kitchen oxymetazoline (AFRIN) 0.05 % nasal spray Place 1 spray into both nostrils 2 (two) times daily as needed for congestion.   . phentermine (ADIPEX-P) 37.5 MG tablet Take 1 tablet (37.5 mg total) by mouth daily before breakfast.   . predniSONE (DELTASONE) 5 MG tablet TAKE 2 TABLETS (10 MG TOTAL) BY MOUTH DAILY WITH BREAKFAST.   . SYMBICORT 160-4.5 MCG/ACT inhaler INHALE 2 PUFF TWICE A DAY   . tiZANidine (ZANAFLEX) 4 MG tablet TAKE 1 TABLET (4 MG TOTAL) BY MOUTH 3 (THREE) TIMES DAILY.   . traMADol (ULTRAM) 50 MG tablet Take 1 tablet (50 mg total) by mouth every 8 (eight) hours as needed for moderate pain.   Marland Kitchen tretinoin (RETIN-A) 0.025 % cream Apply 1 application topically at bedtime as needed (for eczema on face).    Marland Kitchen umeclidinium bromide (INCRUSE ELLIPTA) 62.5 MCG/INH AEPB Take one puff every day   . VICTOZA 18 MG/3ML SOPN INJECT 1.8 MG INTO THE SKIN EVERY MORNING   . warfarin (COUMADIN) 1 MG tablet Take one tab as needed with coumadin 5 mg qd   . warfarin (COUMADIN) 5 MG tablet Take a whole tablet on Monday, Tuesday, Thursday, Saturday and Sunday.  Take a 1/2 tablet on Wednesday and Friday.   . [DISCONTINUED] sulfamethoxazole-trimethoprim (BACTRIM DS) 800-160 MG tablet Take 1 tablet by mouth 2 (two) times daily.    No facility-administered encounter medications on file as of 06/03/2019.    Surgical History: Past Surgical History:  Procedure Laterality Date  . APPENDECTOMY    . COLONOSCOPY WITH PROPOFOL N/A 02/02/2018   Procedure: COLONOSCOPY WITH PROPOFOL;  Surgeon: Jonathon Bellows, MD;  Location: [facility] ENDOSCOPY;  Service:  Gastroenterology;  Laterality: N/A;  . LAPAROSCOPIC APPENDECTOMY N/A 02/05/2018   Procedure: APPENDECTOMY LAPAROSCOPIC;  Surgeon: Jules Husbands, MD;  Location: [facility] ORS;  Service: General;  Laterality: N/A;  . right arm surgery    . TUBAL LIGATION      Medical History: Past Medical History:  Diagnosis Date  . Asthma   . Atopic dermatitis   . Collagen vascular disease (HCC)    Rhematoid Arthritis  . Constipation   . COPD (chronic obstructive pulmonary disease) ([place])   . Diabetes mellitus without complication ([place])   .  DVT (deep venous thrombosis) ([place])   . GERD (gastroesophageal reflux disease)   . Hyperlipidemia   . Hypertension   . Rheumatoid arthritis ([place])     Family History: Family History  Problem Relation Age of Onset  . Breast cancer Mother   . Hypertension Mother   . Diabetes Mother   . Hypertension Father   . Heart failure Father     Social History   Socioeconomic History  . Marital status: Single    Spouse name: Not on file  . Number of children: Not on file  . Years of education: Not on file  . Highest education level: Not on file  Occupational History  . Not on file  Tobacco Use  . Smoking status: Former Research scientist (life sciences)  . Smokeless tobacco: Never Used  Substance and Sexual Activity  . Alcohol use: No  . Drug use: No  . Sexual activity: Yes  Other Topics Concern  . Not on file  Social History Narrative  . Not on file   Social Determinants of Health   Financial Resource Strain:   . Difficulty of Paying Living Expenses: Not on file  Food Insecurity:   . Worried About Charity fundraiser in the Last Year: Not on file  . Ran Out of Food in the Last Year: Not on file  Transportation Needs:   . Lack of Transportation (Medical): Not on file  . Lack of Transportation (Non-Medical): Not on file  Physical Activity:   . Days of Exercise per Week: Not on file  . Minutes of Exercise per Session: Not on file  Stress:   . Feeling of Stress : Not on file   Social Connections:   . Frequency of Communication with Friends and Family: Not on file  . Frequency of Social Gatherings with Friends and Family: Not on file  . Attends Religious Services: Not on file  . Active Member of Clubs or Organizations: Not on file  . Attends Archivist Meetings: Not on file  . Marital Status: Not on file  Intimate Partner Violence:   . Fear of Current or Ex-Partner: Not on file  . Emotionally Abused: Not on file  . Physically Abused: Not on file  . Sexually Abused: Not on file      Review of Systems  Constitutional: Negative for chills, fatigue and unexpected weight change.  HENT: Negative for congestion, rhinorrhea, sneezing and sore throat.   Eyes: Negative for photophobia, pain and redness.  Respiratory: Negative for cough, chest tightness and shortness of breath.   Cardiovascular: Negative for chest pain and palpitations.  Gastrointestinal: Negative for abdominal pain, constipation, diarrhea, nausea and vomiting.  Endocrine: Negative.   Genitourinary: Negative for dysuria and frequency.  Musculoskeletal: Negative for arthralgias, back pain, joint swelling and neck pain.  Skin: Negative for rash.  Allergic/Immunologic: Negative.   Neurological: Negative for tremors and numbness.  Hematological: Negative for adenopathy. Does not bruise/bleed easily.  Psychiatric/Behavioral: Negative for behavioral problems and sleep disturbance. The patient is not nervous/anxious.     Vital Signs: There were no vitals taken for this visit.   Observation/Objective:  Well appearing, NAD noted.    Assessment/Plan: 1. Chronic obstructive pulmonary disease, unspecified COPD type ([place]) Stable, continue nebulizers and inhalers as directed.   2. Essential (primary) hypertension Stable, continue current management.  3. Atherosclerosis of aorta ([place]) Will get carotid US to evaluate for atherosclerosis.  - US Carotid Bilateral; Future  4. COVID-19  with comorbid diabetes mellitus ([place]) Recovered  at this time. Return to work note provided for patient.    General Counseling: romina pulis understanding of the findings of today's phone visit and agrees with plan of treatment. I have discussed any further diagnostic evaluation that may be needed or ordered today. We also reviewed her medications today. she has been encouraged to call the office with any questions or concerns that should arise related to todays visit.    Orders Placed This Encounter  Procedures  . US Carotid Bilateral    No orders of the defined types were placed in this encounter.   Time spent: [facility] Internal medicine

## 2019-06-21 ENCOUNTER — Ambulatory Visit
Admission: RE | Admit: 2019-06-21 | Discharge: 2019-06-21 | Disposition: A | Discharge status: Home / Self Care | Payer: Medicare Other | Source: Ambulatory Visit | Attending: Adult Health | Admitting: Adult Health

## 2019-06-21 DIAGNOSIS — Z1231 Encounter for screening mammogram for malignant neoplasm of breast: Secondary | ICD-10-CM | POA: Diagnosis not present

## 2019-06-27 ENCOUNTER — Telehealth: Payer: Self-pay

## 2019-06-27 NOTE — Telephone Encounter (Signed)
Called lmom informing patient of appointment on 07/02/2019. klh

## 2019-06-28 ENCOUNTER — Ambulatory Visit (INDEPENDENT_AMBULATORY_CARE_PROVIDER_SITE_OTHER): Payer: Medicaid Other

## 2019-06-28 ENCOUNTER — Other Ambulatory Visit: Payer: Self-pay

## 2019-06-28 ENCOUNTER — Other Ambulatory Visit: Payer: Self-pay | Admitting: Adult Health

## 2019-06-28 DIAGNOSIS — I6523 Occlusion and stenosis of bilateral carotid arteries: Secondary | ICD-10-CM

## 2019-06-28 DIAGNOSIS — I700 Atherosclerosis of aorta: Secondary | ICD-10-CM

## 2019-06-30 NOTE — Procedures (Signed)
[place], [place] [address]  DATE OF SERVICE: June 28, 2019  CAROTID DOPPLER INTERPRETATION:  Bilateral Carotid Ultrsasound and Color Doppler Examination was performed. The RIGHT CCA shows mild plaque in the vessel. The LEFT CCA shows mild plaque in the vessel. There was no significant intimal thickening noted in the RIGHT carotid artery. There was no significant intimal thickening in the LEFT carotid artery.  The RIGHT CCA shows peak systolic velocity of 87 cm per second. The end diastolic velocity is 28 cm per second on the RIGHT side. The RIGHT ICA shows peak systolic velocity of [id number] per second. RIGHT sided ICA end diastolic velocity is 46 cm per second. The RIGHT ECA shows a peak systolic velocity of 0000000 cm per second. The ICA/CCA ratio is calculated to be 1.6. This suggests 50 to 69% stenosis. The Vertebral Artery shows antegrade flow.  The LEFT CCA shows peak systolic velocity of 82 cm per second. The end diastolic velocity is 17 cm per second on the LEFT side. The LEFT ICA shows peak systolic velocity of 123456 per second. LEFT sided ICA end diastolic velocity is 46 cm per second. The LEFT ECA shows a peak systolic velocity of 99991111 cm per second. The ICA/CCA ratio is calculated to be 1.5. This suggests 50 to 60% stenosis. The Vertebral Artery shows antegrade flow.   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 The LEFT CAROTID shows 50 to 60% stenosis.  There is mild plaque formation noted on the LEFT and mild plaque on the RIGHT  side. Consider a repeat Carotid doppler if clinical situation and symptoms warrant in 6-12 months. Patient should be encouraged to change lifestyles such as smoking cessation, regular exercise and dietary modification. Use of statins in the right clinical setting and ASA is encouraged.  Allyne Gee, MD [facility] Pulmonary Critical Care Medicine

## 2019-07-02 ENCOUNTER — Ambulatory Visit: Payer: Medicare Other | Admitting: Adult Health

## 2019-07-05 ENCOUNTER — Telehealth: Payer: Self-pay

## 2019-07-05 ENCOUNTER — Other Ambulatory Visit: Payer: Self-pay | Admitting: Adult Health

## 2019-07-05 NOTE — Telephone Encounter (Signed)
Called lmom informing patient of appointment on 07/09/2019. klh 

## 2019-07-08 ENCOUNTER — Telehealth: Payer: Self-pay

## 2019-07-08 NOTE — Telephone Encounter (Signed)
Confirmed appointment on 07/09/2019 and screened for covid. klh 

## 2019-07-09 ENCOUNTER — Other Ambulatory Visit: Payer: Self-pay

## 2019-07-09 ENCOUNTER — Encounter: Payer: Self-pay | Admitting: Adult Health

## 2019-07-09 ENCOUNTER — Ambulatory Visit (INDEPENDENT_AMBULATORY_CARE_PROVIDER_SITE_OTHER): Payer: Medicaid Other | Admitting: Adult Health

## 2019-07-09 VITALS — BP 113/72 | HR 71 | Temp 97.2°F | Resp 16 | Ht 65.0 in | Wt 251.0 lb

## 2019-07-09 DIAGNOSIS — E1165 Type 2 diabetes mellitus with hyperglycemia: Secondary | ICD-10-CM | POA: Diagnosis not present

## 2019-07-09 DIAGNOSIS — Z6841 Body Mass Index (BMI) 40.0 and over, adult: Secondary | ICD-10-CM

## 2019-07-09 DIAGNOSIS — I10 Essential (primary) hypertension: Secondary | ICD-10-CM

## 2019-07-09 DIAGNOSIS — E782 Mixed hyperlipidemia: Secondary | ICD-10-CM

## 2019-07-09 DIAGNOSIS — I700 Atherosclerosis of aorta: Secondary | ICD-10-CM | POA: Diagnosis not present

## 2019-07-09 DIAGNOSIS — J449 Chronic obstructive pulmonary disease, unspecified: Secondary | ICD-10-CM

## 2019-07-09 LAB — POCT GLYCOSYLATED HEMOGLOBIN (HGB A1C): Hemoglobin A1C: 7.6 % — AB (ref 4.0–5.6)

## 2019-07-09 MED ORDER — PHENTERMINE HCL 37.5 MG PO TABS: 37.5000 mg | ORAL_TABLET | Freq: Every day | ORAL | 0 refills | Status: DC

## 2019-07-09 MED ORDER — PREDNISONE 5 MG PO TABS: ORAL_TABLET | ORAL | 0 refills | Status: DC

## 2019-07-09 NOTE — Progress Notes (Signed)
[facility] [place], [address] [address]  Internal MEDICINE  Office Visit Note  Patient Name: Brenda Santana  [id number]  [id number]  Date of Service: 07/09/2019  Chief Complaint  Patient presents with  . Diabetes  . Gastroesophageal Reflux  . Hyperlipidemia  . Hypertension  . Follow-up    wants to know if its ok to get covid vaccine   . Medication Refill    HPI  Pt is here for follow up on Carotid US. The US shows 50-69% stenosis on the right, and 50-60% stenosis on the left. She is on daily statin therapy.  She has GERD, HTN, HLD that are well controlled. She Denies Chest pain, Shortness of breath, palpitations, headache, or blurred vision. She takes pepcid for her gerd symptoms.    Current Medication: Outpatient Encounter Medications as of 07/09/2019  Medication Sig Note  . Accu-Chek FastClix Lancets MISC Use as directed once a daily diag E11.9   . acyclovir (ZOVIRAX) 400 MG tablet TAKE 1 TABLET BY MOUTH TWICE A DAY   . acyclovir ointment (ZOVIRAX) 5 % APPLY TOPICALLY EVERY 3 (THREE) HOURS.   Marland Kitchen albuterol (PROAIR HFA) 108 (90 Base) MCG/ACT inhaler INHALE 2 PUFFS INTO THE LUNGS EVERY 4 (FOUR) HOURS AS NEEDED FOR WHEEZING OR SHORTNESS OF BREATH.   Marland Kitchen allopurinol (ZYLOPRIM) 300 MG tablet TAKE ONE TAB AT NIGHT FOR GOUT   . aspirin EC 81 MG tablet Take 81 mg by mouth daily.   Marland Kitchen atorvastatin (LIPITOR) 10 MG tablet Take 1 tablet (10 mg total) by mouth daily.   Marland Kitchen azithromycin (ZITHROMAX) 250 MG tablet Take four tablets at one with food.   . BD PEN NEEDLE NANO U/F 32G X 4 MM MISC USE DAILY WITH VICTOZA   . benazepril (LOTENSIN) 10 MG tablet Take 1 tablet (10 mg total) by mouth daily.   . Biotin 5 MG CAPS Take 5 mg by mouth daily.    . Canagliflozin-metFORMIN HCl (INVOKAMET) 50-500 MG TABS Take 1 tablet by mouth 2 (two) times daily.   . cetirizine (ZYRTEC ALLERGY) 10 MG tablet Take 1 tablet (10 mg total) by mouth daily as needed for allergies.   . chlorthalidone  (HYGROTON) 25 MG tablet TAKE 1 TABLET BY MOUTH EVERY DAY IN THE MORNING   . clobetasol cream (TEMOVATE) [id number] % Apply 1 application topically 2 (two) times daily as needed (for eczema on hands).    . COMBIVENT RESPIMAT 20-100 MCG/ACT AERS respimat INHALE 1 PUFF INTO THE LUNGS EVERY 6 (SIX) HOURS.   Marland Kitchen diclofenac sodium (VOLTAREN) 1 % GEL APPLY 4 G TOPICALLY FOUR (4) TIMES A DAY.   Marland Kitchen docusate sodium (COLACE) 100 MG capsule Take 100 mg by mouth daily.    Marland Kitchen doxycycline (VIBRA-TABS) 100 MG tablet Take 1 tablet (100 mg total) by mouth 2 (two) times daily.   . famotidine (PEPCID) 20 MG tablet Take 20 mg by mouth daily as needed for heartburn or indigestion.    . Ferrous Sulfate (IRON) 325 (65 Fe) MG TABS Take 325 mg by mouth 3 (three) times daily.  01/31/2018: Stopped for procedure  . gabapentin (NEURONTIN) 800 MG tablet Take 800 mg by mouth 3 (three) times daily as needed (for pain).    Marland Kitchen glucose blood (ACCU-CHEK GUIDE) test strip 1 each by Other route daily. Use as instructed  Once daily diag e11.9   . INCRUSE ELLIPTA 62.5 MCG/INH AEPB TAKE ONE PUFF EVERY DAY   . ipratropium-albuterol (DUONEB) 0.5-2.5 (3) MG/3ML SOLN  Take 3 mLs by nebulization every 6 (six) hours as needed (for shortness of breath or wheezing).    Marland Kitchen lidocaine (XYLOCAINE) 5 % ointment APPLY A SMALL AMOUNT TO AFFECTED AREA 2-3 TIMES A DAY   . LINZESS 145 MCG CAPS capsule TAKE 1 CAPSULE (145 MCG TOTAL) BY MOUTH DAILY BEFORE BREAKFAST.   Marland Kitchen lisinopril (ZESTRIL) 10 MG tablet TAKE 1 TAB DAILY IN MORNING   . metoprolol tartrate (LOPRESSOR) 50 MG tablet Take 1 tablet (50 mg total) by mouth 2 (two) times daily.   . montelukast (SINGULAIR) 10 MG tablet TAKE 1 TABLET BY MOUTH DAILY FOR ASTHMA   . ondansetron (ZOFRAN ODT) 4 MG disintegrating tablet Allow 1-2 tablets to dissolve in your mouth every 8 hours as needed for nausea/vomiting   . oxyCODONE-acetaminophen (PERCOCET/ROXICET) 5-325 MG tablet Take 1 tablet by mouth every 6 (six) hours as needed  for severe pain.   Marland Kitchen oxymetazoline (AFRIN) 0.05 % nasal spray Place 1 spray into both nostrils 2 (two) times daily as needed for congestion.   . phentermine (ADIPEX-P) 37.5 MG tablet Take 1 tablet (37.5 mg total) by mouth daily before breakfast.   . predniSONE (DELTASONE) 5 MG tablet TAKE 2 TABLETS BY MOUTH EVERY DAY WITH BREAKFAST   . SYMBICORT 160-4.5 MCG/ACT inhaler INHALE 2 PUFF TWICE A DAY   . tiZANidine (ZANAFLEX) 4 MG tablet TAKE 1 TABLET (4 MG TOTAL) BY MOUTH 3 (THREE) TIMES DAILY.   . traMADol (ULTRAM) 50 MG tablet Take 1 tablet (50 mg total) by mouth every 8 (eight) hours as needed for moderate pain.   Marland Kitchen tretinoin (RETIN-A) 0.025 % cream Apply 1 application topically at bedtime as needed (for eczema on face).    Marland Kitchen VICTOZA 18 MG/3ML SOPN INJECT 1.8 MG INTO THE SKIN EVERY MORNING   . warfarin (COUMADIN) 1 MG tablet Take one tab as needed with coumadin 5 mg qd   . warfarin (COUMADIN) 5 MG tablet Take a whole tablet on Monday, Tuesday, Thursday, Saturday and Sunday.  Take a 1/2 tablet on Wednesday and Friday.    No facility-administered encounter medications on file as of 07/09/2019.    Surgical History: Past Surgical History:  Procedure Laterality Date  . APPENDECTOMY    . COLONOSCOPY WITH PROPOFOL N/A 02/02/2018   Procedure: COLONOSCOPY WITH PROPOFOL;  Surgeon: Jonathon Bellows, MD;  Location: [facility] ENDOSCOPY;  Service: Gastroenterology;  Laterality: N/A;  . LAPAROSCOPIC APPENDECTOMY N/A 02/05/2018   Procedure: APPENDECTOMY LAPAROSCOPIC;  Surgeon: Jules Husbands, MD;  Location: [facility] ORS;  Service: General;  Laterality: N/A;  . right arm surgery    . TUBAL LIGATION      Medical History: Past Medical History:  Diagnosis Date  . Asthma   . Atopic dermatitis   . Collagen vascular disease (HCC)    Rhematoid Arthritis  . Constipation   . COPD (chronic obstructive pulmonary disease) ([place])   . Diabetes mellitus without complication ([place])   . DVT (deep venous thrombosis) ([place])   . GERD  (gastroesophageal reflux disease)   . Hyperlipidemia   . Hypertension   . Rheumatoid arthritis ([place])     Family History: Family History  Problem Relation Age of Onset  . Breast cancer Mother   . Hypertension Mother   . Diabetes Mother   . Hypertension Father   . Heart failure Father     Social History   Socioeconomic History  . Marital status: Single    Spouse name: Not on file  . Number of children:  Not on file  . Years of education: Not on file  . Highest education level: Not on file  Occupational History  . Not on file  Tobacco Use  . Smoking status: Former Research scientist (life sciences)  . Smokeless tobacco: Never Used  Substance and Sexual Activity  . Alcohol use: No  . Drug use: No  . Sexual activity: Yes  Other Topics Concern  . Not on file  Social History Narrative  . Not on file   Social Determinants of Health   Financial Resource Strain:   . Difficulty of Paying Living Expenses: Not on file  Food Insecurity:   . Worried About Charity fundraiser in the Last Year: Not on file  . Ran Out of Food in the Last Year: Not on file  Transportation Needs:   . Lack of Transportation (Medical): Not on file  . Lack of Transportation (Non-Medical): Not on file  Physical Activity:   . Days of Exercise per Week: Not on file  . Minutes of Exercise per Session: Not on file  Stress:   . Feeling of Stress : Not on file  Social Connections:   . Frequency of Communication with Friends and Family: Not on file  . Frequency of Social Gatherings with Friends and Family: Not on file  . Attends Religious Services: Not on file  . Active Member of Clubs or Organizations: Not on file  . Attends Archivist Meetings: Not on file  . Marital Status: Not on file  Intimate Partner Violence:   . Fear of Current or Ex-Partner: Not on file  . Emotionally Abused: Not on file  . Physically Abused: Not on file  . Sexually Abused: Not on file      Review of Systems  Constitutional: Negative for  chills, fatigue and unexpected weight change.  HENT: Negative for congestion, rhinorrhea, sneezing and sore throat.   Eyes: Negative for photophobia, pain and redness.  Respiratory: Negative for cough, chest tightness and shortness of breath.   Cardiovascular: Negative for chest pain and palpitations.  Gastrointestinal: Negative for abdominal pain, constipation, diarrhea, nausea and vomiting.  Endocrine: Negative.   Genitourinary: Negative for dysuria and frequency.  Musculoskeletal: Negative for arthralgias, back pain, joint swelling and neck pain.  Skin: Negative for rash.  Allergic/Immunologic: Negative.   Neurological: Negative for tremors and numbness.  Hematological: Negative for adenopathy. Does not bruise/bleed easily.  Psychiatric/Behavioral: Negative for behavioral problems and sleep disturbance. The patient is not nervous/anxious.     Vital Signs: BP 113/72   Pulse 71   Temp (!) 97.2 F (36.2 C)   Resp 16   Ht 5\' 5"  (1.651 m)   Wt 251 lb (113.9 kg)   SpO2 97%   BMI 41.77 kg/m    Physical Exam Vitals and nursing note reviewed.  Constitutional:      General: She is not in acute distress.    Appearance: She is well-developed. She is not diaphoretic.  HENT:     Head: Normocephalic and atraumatic.     Mouth/Throat:     Pharynx: No oropharyngeal exudate.  Eyes:     Pupils: Pupils are equal, round, and reactive to light.  Neck:     Thyroid: No thyromegaly.     Vascular: No JVD.     Trachea: No tracheal deviation.  Cardiovascular:     Rate and Rhythm: Normal rate and regular rhythm.     Heart sounds: Normal heart sounds. No murmur. No friction rub. No gallop.  Pulmonary:     Effort: Pulmonary effort is normal. No respiratory distress.     Breath sounds: Normal breath sounds. No wheezing or rales.  Chest:     Chest wall: No tenderness.  Abdominal:     Palpations: Abdomen is soft.     Tenderness: There is no abdominal tenderness. There is no guarding.   Musculoskeletal:        General: Normal range of motion.     Cervical back: Normal range of motion and neck supple.  Lymphadenopathy:     Cervical: No cervical adenopathy.  Skin:    General: Skin is warm and dry.  Neurological:     Mental Status: She is alert and oriented to person, place, and time.     Cranial Nerves: No cranial nerve deficit.  Psychiatric:        Behavior: Behavior normal.        Thought Content: Thought content normal.        Judgment: Judgment normal.    Assessment/Plan: 1. Atherosclerosis of aorta (HCC) Continue statin therapy, Carotid US up to date, continue to monitor in future.   2. Uncontrolled type 2 diabetes mellitus with hyperglycemia (HCC) - POCT HgB A1C  3. BMI 40.0-44.9, adult (HCC) Obesity Counseling: Risk Assessment: An assessment of behavioral risk factors was made today and includes lack of exercise sedentary lifestyle, lack of portion control and poor dietary habits.  Risk Modification Advice: She was counseled on portion control guidelines. Restricting daily caloric intake to 1800. The detrimental long term effects of obesity on her health and ongoing poor compliance was also discussed with the patient. There is a liability release in patients' chart. There has been a 10 minute discussion about the side effects including but not limited to elevated blood pressure, anxiety, lack of sleep and dry mouth. Pt understands and will like to start/continue on appetite suppressant at this time. There will be one month RX given at the time of visit with proper follow up. [place] diet plan with restricted calories is given to the pt. Pt understands and agrees with  plan of treatment - phentermine (ADIPEX-P) 37.5 MG tablet; Take 1 tablet (37.5 mg total) by mouth daily before breakfast.  Dispense: 30 tablet; Refill: 0  4. Essential (primary) hypertension Controlled currently.   5. Mixed hyperlipidemia Stable, continue statin therapy, and continue to  monitor.  6. Chronic obstructive pulmonary disease, unspecified COPD type ([place]) Refilled patients prednisone for copd flares.  - predniSONE (DELTASONE) 5 MG tablet; TAKE 2 TABLETS BY MOUTH EVERY DAY WITH BREAKFAST  Dispense: 60 tablet; Refill: 0  7. Morbid obesity ([place]) Continue weight loss program.   General Counseling: Arline Asp understanding of the findings of todays visit and agrees with plan of treatment. I have discussed any further diagnostic evaluation that may be needed or ordered today. We also reviewed her medications today. she has been encouraged to call the office with any questions or concerns that should arise related to todays visit.    Orders Placed This Encounter  Procedures  . POCT HgB A1C    No orders of the defined types were placed in this encounter.   Time spent: 25 Minutes   This patient was seen by Orson Gear AGNP-C in Collaboration with Dr Lavera Guise as a part of collaborative care agreement     Kendell Bane AGNP-C Internal medicine

## 2019-07-22 ENCOUNTER — Other Ambulatory Visit: Payer: Self-pay

## 2019-07-22 ENCOUNTER — Ambulatory Visit: Payer: Medicare Other | Attending: Internal Medicine

## 2019-07-22 DIAGNOSIS — Z23 Encounter for immunization: Secondary | ICD-10-CM

## 2019-07-22 DIAGNOSIS — B009 Herpesviral infection, unspecified: Secondary | ICD-10-CM

## 2019-07-22 MED ORDER — ACYCLOVIR 400 MG PO TABS: 400.0000 mg | ORAL_TABLET | Freq: Two times a day (BID) | ORAL | 1 refills | Status: DC

## 2019-07-22 NOTE — Progress Notes (Signed)
   Covid-19 Vaccination Clinic  Name:  Brenda Santana    MRN: [id number] DOB: Aug 11, 1960  07/22/2019  Ms. Neel was observed post Covid-19 immunization for 15 minutes without incident. She was provided with Vaccine Information Sheet and instruction to access the V-Safe system.   Ms. Delcarlo was instructed to call 911 with any severe reactions post vaccine: Marland Kitchen Difficulty breathing  . Swelling of face and throat  . A fast heartbeat  . A bad rash all over body  . Dizziness and weakness   Immunizations Administered    Name Date Dose VIS Date Route   Pfizer COVID-19 Vaccine 07/22/2019 10:36 AM 0.3 mL 04/12/2019 Intramuscular   Manufacturer: [place]   Lot: [id number]   [place]: [id number]

## 2019-08-07 ENCOUNTER — Ambulatory Visit: Payer: Medicare Other | Admitting: Adult Health

## 2019-08-07 ENCOUNTER — Telehealth: Payer: Self-pay

## 2019-08-07 NOTE — Telephone Encounter (Signed)
Confirmed appointment on 08/09/2019 and screened for covid.[id number] °

## 2019-08-09 ENCOUNTER — Other Ambulatory Visit: Payer: Self-pay

## 2019-08-09 ENCOUNTER — Ambulatory Visit (INDEPENDENT_AMBULATORY_CARE_PROVIDER_SITE_OTHER): Payer: Medicaid Other | Admitting: Adult Health

## 2019-08-09 ENCOUNTER — Encounter: Payer: Self-pay | Admitting: Adult Health

## 2019-08-09 VITALS — BP 108/54 | HR 85 | Temp 97.4°F | Resp 16 | Ht 65.0 in | Wt 245.6 lb

## 2019-08-09 DIAGNOSIS — Z6841 Body Mass Index (BMI) 40.0 and over, adult: Secondary | ICD-10-CM

## 2019-08-09 DIAGNOSIS — E1165 Type 2 diabetes mellitus with hyperglycemia: Secondary | ICD-10-CM

## 2019-08-09 DIAGNOSIS — J449 Chronic obstructive pulmonary disease, unspecified: Secondary | ICD-10-CM

## 2019-08-09 DIAGNOSIS — Z7901 Long term (current) use of anticoagulants: Secondary | ICD-10-CM | POA: Diagnosis not present

## 2019-08-09 LAB — POCT INR: INR: 3.2 — AB (ref 2.0–3.0)

## 2019-08-09 MED ORDER — METFORMIN HCL 500 MG PO TABS: 500.0000 mg | ORAL_TABLET | Freq: Two times a day (BID) | ORAL | 3 refills | Status: DC

## 2019-08-09 MED ORDER — BREZTRI AEROSPHERE 160-9-4.8 MCG/ACT IN AERO: 2.0000 | INHALATION_SPRAY | Freq: Two times a day (BID) | RESPIRATORY_TRACT | 3 refills | Status: DC

## 2019-08-09 MED ORDER — GLIMEPIRIDE 2 MG PO TABS: 2.0000 mg | ORAL_TABLET | Freq: Every day | ORAL | 3 refills | Status: DC

## 2019-08-09 MED ORDER — PHENTERMINE HCL 37.5 MG PO TABS: 37.5000 mg | ORAL_TABLET | Freq: Every day | ORAL | 0 refills | Status: DC

## 2019-08-09 NOTE — Progress Notes (Signed)
[facility] [place], [place] [address]  Internal MEDICINE  Office Visit Note  Patient Name: Brenda Santana  [id number]  [id number]  Date of Service: 08/09/2019  Chief Complaint  Patient presents with  . Follow-up    weight  loss, more flare ups with copd   . Medication Refill    HPI  Pt is here for weight loss follow up. Patient is using phentermine for weight loss.  Since our last visit they have lost 6 pounds.  The patient denies any chest pain, palpitations, shortness of breath, constipation, headaches or any other side effects of the medication.  Patient wishes to continue to use this medication for weight loss at this time.  She also is reporting that her COPD has been giving her problems over the last couple of months.  She currently uses encruse and proair, as well duonebs in the nebulizer.  She reports having to use the duonebs more often.     Current Medication: Outpatient Encounter Medications as of 08/09/2019  Medication Sig Note  . Accu-Chek FastClix Lancets MISC Use as directed once a daily diag E11.9   . acyclovir (ZOVIRAX) 400 MG tablet Take 1 tablet (400 mg total) by mouth 2 (two) times daily.   Marland Kitchen acyclovir ointment (ZOVIRAX) 5 % APPLY TOPICALLY EVERY 3 (THREE) HOURS.   Marland Kitchen albuterol (PROAIR HFA) 108 (90 Base) MCG/ACT inhaler INHALE 2 PUFFS INTO THE LUNGS EVERY 4 (FOUR) HOURS AS NEEDED FOR WHEEZING OR SHORTNESS OF BREATH.   Marland Kitchen allopurinol (ZYLOPRIM) 300 MG tablet TAKE ONE TAB AT NIGHT FOR GOUT   . aspirin EC 81 MG tablet Take 81 mg by mouth daily.   Marland Kitchen atorvastatin (LIPITOR) 10 MG tablet Take 1 tablet (10 mg total) by mouth daily.   Marland Kitchen azithromycin (ZITHROMAX) 250 MG tablet Take four tablets at one with food.   . BD PEN NEEDLE NANO U/F 32G X 4 MM MISC USE DAILY WITH VICTOZA   . benazepril (LOTENSIN) 10 MG tablet Take 1 tablet (10 mg total) by mouth daily.   . Biotin 5 MG CAPS Take 5 mg by mouth daily.    . cetirizine (ZYRTEC ALLERGY) 10 MG tablet  Take 1 tablet (10 mg total) by mouth daily as needed for allergies.   . chlorthalidone (HYGROTON) 25 MG tablet TAKE 1 TABLET BY MOUTH EVERY DAY IN THE MORNING   . clobetasol cream (TEMOVATE) [id number] % Apply 1 application topically 2 (two) times daily as needed (for eczema on hands).    . diclofenac sodium (VOLTAREN) 1 % GEL APPLY 4 G TOPICALLY FOUR (4) TIMES A DAY.   Marland Kitchen docusate sodium (COLACE) 100 MG capsule Take 100 mg by mouth daily.    Marland Kitchen doxycycline (VIBRA-TABS) 100 MG tablet Take 1 tablet (100 mg total) by mouth 2 (two) times daily.   . famotidine (PEPCID) 20 MG tablet Take 20 mg by mouth daily as needed for heartburn or indigestion.    . Ferrous Sulfate (IRON) 325 (65 Fe) MG TABS Take 325 mg by mouth 3 (three) times daily.  01/31/2018: Stopped for procedure  . gabapentin (NEURONTIN) 800 MG tablet Take 800 mg by mouth 3 (three) times daily as needed (for pain).    Marland Kitchen glucose blood (ACCU-CHEK GUIDE) test strip 1 each by Other route daily. Use as instructed  Once daily diag e11.9   . INCRUSE ELLIPTA 62.5 MCG/INH AEPB TAKE ONE PUFF EVERY DAY   . ipratropium-albuterol (DUONEB) 0.5-2.5 (3) MG/3ML SOLN Take 3  mLs by nebulization every 6 (six) hours as needed (for shortness of breath or wheezing).    Marland Kitchen lidocaine (XYLOCAINE) 5 % ointment APPLY A SMALL AMOUNT TO AFFECTED AREA 2-3 TIMES A DAY   . LINZESS 145 MCG CAPS capsule TAKE 1 CAPSULE (145 MCG TOTAL) BY MOUTH DAILY BEFORE BREAKFAST.   Marland Kitchen lisinopril (ZESTRIL) 10 MG tablet TAKE 1 TAB DAILY IN MORNING   . metoprolol tartrate (LOPRESSOR) 50 MG tablet Take 1 tablet (50 mg total) by mouth 2 (two) times daily.   . montelukast (SINGULAIR) 10 MG tablet TAKE 1 TABLET BY MOUTH DAILY FOR ASTHMA   . ondansetron (ZOFRAN ODT) 4 MG disintegrating tablet Allow 1-2 tablets to dissolve in your mouth every 8 hours as needed for nausea/vomiting   . oxyCODONE-acetaminophen (PERCOCET/ROXICET) 5-325 MG tablet Take 1 tablet by mouth every 6 (six) hours as needed for severe pain.    Marland Kitchen oxymetazoline (AFRIN) 0.05 % nasal spray Place 1 spray into both nostrils 2 (two) times daily as needed for congestion.   . phentermine (ADIPEX-P) 37.5 MG tablet Take 1 tablet (37.5 mg total) by mouth daily before breakfast.   . predniSONE (DELTASONE) 5 MG tablet TAKE 2 TABLETS BY MOUTH EVERY DAY WITH BREAKFAST   . SYMBICORT 160-4.5 MCG/ACT inhaler INHALE 2 PUFF TWICE A DAY   . tiZANidine (ZANAFLEX) 4 MG tablet TAKE 1 TABLET (4 MG TOTAL) BY MOUTH 3 (THREE) TIMES DAILY.   . traMADol (ULTRAM) 50 MG tablet Take 1 tablet (50 mg total) by mouth every 8 (eight) hours as needed for moderate pain.   Marland Kitchen tretinoin (RETIN-A) 0.025 % cream Apply 1 application topically at bedtime as needed (for eczema on face).    Marland Kitchen VICTOZA 18 MG/3ML SOPN INJECT 1.8 MG INTO THE SKIN EVERY MORNING   . warfarin (COUMADIN) 1 MG tablet Take one tab as needed with coumadin 5 mg qd   . warfarin (COUMADIN) 5 MG tablet Take a whole tablet on Monday, Tuesday, Thursday, Saturday and Sunday.  Take a 1/2 tablet on Wednesday and Friday.   . [DISCONTINUED] Canagliflozin-metFORMIN HCl (INVOKAMET) 50-500 MG TABS Take 1 tablet by mouth 2 (two) times daily.   . [DISCONTINUED] COMBIVENT RESPIMAT 20-100 MCG/ACT AERS respimat INHALE 1 PUFF INTO THE LUNGS EVERY 6 (SIX) HOURS.   . [DISCONTINUED] phentermine (ADIPEX-P) 37.5 MG tablet Take 1 tablet (37.5 mg total) by mouth daily before breakfast.   . Budeson-Glycopyrrol-Formoterol (BREZTRI AEROSPHERE) 160-9-4.8 MCG/ACT AERO Inhale 2 puffs into the lungs in the morning and at bedtime.   Marland Kitchen glimepiride (AMARYL) 2 MG tablet Take 1 tablet (2 mg total) by mouth daily before breakfast.   . metFORMIN (GLUCOPHAGE) 500 MG tablet Take 1 tablet (500 mg total) by mouth 2 (two) times daily with a meal.    No facility-administered encounter medications on file as of 08/09/2019.    Surgical History: Past Surgical History:  Procedure Laterality Date  . APPENDECTOMY    . COLONOSCOPY WITH PROPOFOL N/A 02/02/2018    Procedure: COLONOSCOPY WITH PROPOFOL;  Surgeon: Jonathon Bellows, MD;  Location: [facility] ENDOSCOPY;  Service: Gastroenterology;  Laterality: N/A;  . LAPAROSCOPIC APPENDECTOMY N/A 02/05/2018   Procedure: APPENDECTOMY LAPAROSCOPIC;  Surgeon: Jules Husbands, MD;  Location: [facility] ORS;  Service: General;  Laterality: N/A;  . right arm surgery    . TUBAL LIGATION      Medical History: Past Medical History:  Diagnosis Date  . Asthma   . Atopic dermatitis   . Collagen vascular disease ([place])  Rhematoid Arthritis  . Constipation   . COPD (chronic obstructive pulmonary disease) ([place])   . Diabetes mellitus without complication ([place])   . DVT (deep venous thrombosis) ([place])   . GERD (gastroesophageal reflux disease)   . Hyperlipidemia   . Hypertension   . Rheumatoid arthritis ([place])     Family History: Family History  Problem Relation Age of Onset  . Breast cancer Mother   . Hypertension Mother   . Diabetes Mother   . Hypertension Father   . Heart failure Father     Social History   Socioeconomic History  . Marital status: Single    Spouse name: Not on file  . Number of children: Not on file  . Years of education: Not on file  . Highest education level: Not on file  Occupational History  . Not on file  Tobacco Use  . Smoking status: Former Research scientist (life sciences)  . Smokeless tobacco: Never Used  Substance and Sexual Activity  . Alcohol use: No  . Drug use: No  . Sexual activity: Yes  Other Topics Concern  . Not on file  Social History Narrative  . Not on file   Social Determinants of Health   Financial Resource Strain:   . Difficulty of Paying Living Expenses:   Food Insecurity:   . Worried About Charity fundraiser in the Last Year:   . Arboriculturist in the Last Year:   Transportation Needs:   . Film/video editor (Medical):   Marland Kitchen Lack of Transportation (Non-Medical):   Physical Activity:   . Days of Exercise per Week:   . Minutes of Exercise per Session:   Stress:   . Feeling  of Stress :   Social Connections:   . Frequency of Communication with Friends and Family:   . Frequency of Social Gatherings with Friends and Family:   . Attends Religious Services:   . Active Member of Clubs or Organizations:   . Attends Archivist Meetings:   Marland Kitchen Marital Status:   Intimate Partner Violence:   . Fear of Current or Ex-Partner:   . Emotionally Abused:   Marland Kitchen Physically Abused:   . Sexually Abused:       Review of Systems  Constitutional: Negative for chills, fatigue and unexpected weight change.  HENT: Negative for congestion, rhinorrhea, sneezing and sore throat.   Eyes: Negative for photophobia, pain and redness.  Respiratory: Positive for cough and shortness of breath. Negative for chest tightness.   Cardiovascular: Negative for chest pain and palpitations.  Gastrointestinal: Negative for abdominal pain, constipation, diarrhea, nausea and vomiting.  Endocrine: Negative.   Genitourinary: Negative for dysuria and frequency.  Musculoskeletal: Negative for arthralgias, back pain, joint swelling and neck pain.  Skin: Negative for rash.  Allergic/Immunologic: Negative.   Neurological: Negative for tremors and numbness.  Hematological: Negative for adenopathy. Does not bruise/bleed easily.  Psychiatric/Behavioral: Negative for behavioral problems and sleep disturbance. The patient is not nervous/anxious.     Vital Signs: BP (!) 108/54   Pulse 85   Temp (!) 97.4 F (36.3 C)   Resp 16   Ht 5\' 5"  (1.651 m)   Wt 245 lb 9.6 oz (111.4 kg)   SpO2 98%   BMI 40.87 kg/m    Physical Exam Vitals and nursing note reviewed.  Constitutional:      General: She is not in acute distress.    Appearance: She is well-developed. She is not diaphoretic.  HENT:  Head: Normocephalic and atraumatic.     Mouth/Throat:     Pharynx: No oropharyngeal exudate.  Eyes:     Pupils: Pupils are equal, round, and reactive to light.  Neck:     Thyroid: No thyromegaly.      Vascular: No JVD.     Trachea: No tracheal deviation.  Cardiovascular:     Rate and Rhythm: Normal rate and regular rhythm.     Heart sounds: Normal heart sounds. No murmur. No friction rub. No gallop.   Pulmonary:     Effort: Pulmonary effort is normal. No respiratory distress.     Breath sounds: Normal breath sounds. No wheezing or rales.  Chest:     Chest wall: No tenderness.  Abdominal:     Palpations: Abdomen is soft.     Tenderness: There is no abdominal tenderness. There is no guarding.  Musculoskeletal:        General: Normal range of motion.     Cervical back: Normal range of motion and neck supple.  Lymphadenopathy:     Cervical: No cervical adenopathy.  Skin:    General: Skin is warm and dry.  Neurological:     Mental Status: She is alert and oriented to person, place, and time.     Cranial Nerves: No cranial nerve deficit.  Psychiatric:        Behavior: Behavior normal.        Thought Content: Thought content normal.        Judgment: Judgment normal.    Assessment/Plan: 1. Uncontrolled type 2 diabetes mellitus with hyperglycemia (HCC) Stable, encouraged good dietary choices, and continued exercise and weight loss. Invokanamet no longer covered by insurance.  Will give Metformin, and Amaryl.   2. Chronic obstructive pulmonary disease, unspecified COPD type ([place]) Sample of Breztri given.  RX sent.  Patient can pick up if good results with sample.  3. Long term (current) use of anticoagulants INR 3.2, patient will change coumadin dosing to 5 mg M-F, and 10mg  on Sat and Sunday.  - POCT INR  4. BMI 40.0-44.9, adult ([place]) There is a liability release in patients' chart. There has been a 10 minute discussion about the side effects including but not limited to elevated blood pressure, anxiety, lack of sleep and dry mouth. Pt understands and will like to start/continue on appetite suppressant at this time. There will be one month RX given at the time of visit with proper  follow up. [place] diet plan with restricted calories is given to the pt. Pt understands and agrees with  plan of treatment Obesity Counseling: Risk Assessment: An assessment of behavioral risk factors was made today and includes lack of exercise sedentary lifestyle, lack of portion control and poor dietary habits.  Risk Modification Advice: She was counseled on portion control guidelines. Restricting daily caloric intake to 1600. The detrimental long term effects of obesity on her health and ongoing poor compliance was also discussed with the patient.   - phentermine (ADIPEX-P) 37.5 MG tablet; Take 1 tablet (37.5 mg total) by mouth daily before breakfast.  Dispense: 30 tablet; Refill: 0  5. Morbid obesity ([place]) BMI  greater than 40  General Counseling: Flynn verbalizes understanding of the findings of todays visit and agrees with plan of treatment. I have discussed any further diagnostic evaluation that may be needed or ordered today. We also reviewed her medications today. she has been encouraged to call the office with any questions or concerns that should arise related to todays visit.  Orders Placed This Encounter  Procedures  . POCT INR    Meds ordered this encounter  Medications  . Budeson-Glycopyrrol-Formoterol (BREZTRI AEROSPHERE) 160-9-4.8 MCG/ACT AERO    Sig: Inhale 2 puffs into the lungs in the morning and at bedtime.    Dispense:  10.7 g    Refill:  3  . phentermine (ADIPEX-P) 37.5 MG tablet    Sig: Take 1 tablet (37.5 mg total) by mouth daily before breakfast.    Dispense:  30 tablet    Refill:  0  . metFORMIN (GLUCOPHAGE) 500 MG tablet    Sig: Take 1 tablet (500 mg total) by mouth 2 (two) times daily with a meal.    Dispense:  180 tablet    Refill:  3  . glimepiride (AMARYL) 2 MG tablet    Sig: Take 1 tablet (2 mg total) by mouth daily before breakfast.    Dispense:  30 tablet    Refill:  3    Time spent: 30 Minutes   This patient was seen by Orson Gear  AGNP-C in Collaboration with Dr Lavera Guise as a part of collaborative care agreement     Kendell Bane AGNP-C Internal medicine

## 2019-08-12 ENCOUNTER — Other Ambulatory Visit: Payer: Self-pay | Admitting: Adult Health

## 2019-08-12 DIAGNOSIS — J449 Chronic obstructive pulmonary disease, unspecified: Secondary | ICD-10-CM

## 2019-08-13 ENCOUNTER — Other Ambulatory Visit: Payer: Self-pay | Admitting: Adult Health

## 2019-08-13 DIAGNOSIS — L732 Hidradenitis suppurativa: Secondary | ICD-10-CM

## 2019-08-14 ENCOUNTER — Ambulatory Visit: Payer: Medicare Other | Attending: Internal Medicine

## 2019-08-14 ENCOUNTER — Other Ambulatory Visit: Payer: Self-pay

## 2019-08-14 DIAGNOSIS — Z23 Encounter for immunization: Secondary | ICD-10-CM

## 2019-08-14 MED ORDER — LINACLOTIDE 145 MCG PO CAPS: ORAL_CAPSULE | ORAL | 3 refills | Status: DC

## 2019-08-14 NOTE — Progress Notes (Signed)
   Covid-19 Vaccination Clinic  Name:  ARLYNNE MASTRIANO    MRN: [id number] DOB: 01-28-1961  08/14/2019  Ms. Avalo was observed post Covid-19 immunization for 15 minutes without incident. She was provided with Vaccine Information Sheet and instruction to access the V-Safe system.   Ms. Buitrago was instructed to call 911 with any severe reactions post vaccine: Marland Kitchen Difficulty breathing  . Swelling of face and throat  . A fast heartbeat  . A bad rash all over body  . Dizziness and weakness   Immunizations Administered    Name Date Dose VIS Date Route   Pfizer COVID-19 Vaccine 08/14/2019 10:56 AM 0.3 mL 04/12/2019 Intramuscular   Manufacturer: [place]   Lot: [id number]   [place]: [id number]

## 2019-08-20 ENCOUNTER — Other Ambulatory Visit: Payer: Self-pay | Admitting: Internal Medicine

## 2019-09-01 ENCOUNTER — Other Ambulatory Visit: Payer: Self-pay | Admitting: Internal Medicine

## 2019-09-06 ENCOUNTER — Telehealth: Payer: Self-pay

## 2019-09-06 NOTE — Telephone Encounter (Signed)
Called lmom informing patient of appointment on 09/10/2019. klh

## 2019-09-10 ENCOUNTER — Ambulatory Visit (INDEPENDENT_AMBULATORY_CARE_PROVIDER_SITE_OTHER): Payer: Medicaid Other | Admitting: Adult Health

## 2019-09-10 ENCOUNTER — Encounter: Payer: Self-pay | Admitting: Adult Health

## 2019-09-10 ENCOUNTER — Other Ambulatory Visit: Payer: Self-pay

## 2019-09-10 ENCOUNTER — Ambulatory Visit: Payer: Medicaid Other

## 2019-09-10 VITALS — BP 125/83 | HR 73 | Temp 97.4°F | Resp 16 | Ht 65.0 in | Wt 250.0 lb

## 2019-09-10 DIAGNOSIS — Z6841 Body Mass Index (BMI) 40.0 and over, adult: Secondary | ICD-10-CM | POA: Diagnosis not present

## 2019-09-10 DIAGNOSIS — E782 Mixed hyperlipidemia: Secondary | ICD-10-CM

## 2019-09-10 DIAGNOSIS — I10 Essential (primary) hypertension: Secondary | ICD-10-CM | POA: Diagnosis not present

## 2019-09-10 DIAGNOSIS — E1165 Type 2 diabetes mellitus with hyperglycemia: Secondary | ICD-10-CM | POA: Diagnosis not present

## 2019-09-10 DIAGNOSIS — J449 Chronic obstructive pulmonary disease, unspecified: Secondary | ICD-10-CM

## 2019-09-10 DIAGNOSIS — Z7901 Long term (current) use of anticoagulants: Secondary | ICD-10-CM

## 2019-09-10 LAB — POCT INR: INR: 2.9 (ref 2.0–3.0)

## 2019-09-10 MED ORDER — PREDNISONE 5 MG PO TABS: ORAL_TABLET | ORAL | 0 refills | Status: DC

## 2019-09-10 MED ORDER — NYSTATIN 100000 UNIT/GM EX POWD: 1.0000 "application " | Freq: Four times a day (QID) | CUTANEOUS | 1 refills | Status: DC

## 2019-09-10 MED ORDER — PHENTERMINE HCL 37.5 MG PO TABS: 37.5000 mg | ORAL_TABLET | Freq: Every day | ORAL | 0 refills | Status: DC

## 2019-09-10 NOTE — Progress Notes (Signed)
[facility] [place], [place] [address]  Internal MEDICINE  Office Visit Note  Patient Name: Brenda Santana  [id number]  [id number]  Date of Service: 09/10/2019  Chief Complaint  Patient presents with  . Follow-up    left leg burns if stand for a long time, gets numb from knee up to thigh  . Medication Refill  . Diabetes  . Gastroesophageal Reflux  . Hyperlipidemia  . Hypertension    HPI  Pt is here for follow up on DM, GERD, HLD, HTN and weight loss. Pts blood pressure and GERD is well controlled.  Denies Chest pain, Shortness of breath, palpitations, headache, or blurred vision. She has not been checking her blood sugar regularly, however she has been feeling well. We discussed importance of checking blood sugars regularly.  She does complain today of some burning on the lateral side of left leg with standing for prolonged periods.  It typically resolves quickly with rest. She has gained 5 pounds, which is likely due to prednisone use. We discussed compliance with diet and exercise.     Current Medication: Outpatient Encounter Medications as of 09/10/2019  Medication Sig Note  . Accu-Chek FastClix Lancets MISC Use as directed once a daily diag E11.9   . acyclovir (ZOVIRAX) 400 MG tablet Take 1 tablet (400 mg total) by mouth 2 (two) times daily.   Marland Kitchen acyclovir ointment (ZOVIRAX) 5 % APPLY TOPICALLY EVERY 3 (THREE) HOURS.   Marland Kitchen albuterol (PROAIR HFA) 108 (90 Base) MCG/ACT inhaler INHALE 2 PUFFS INTO THE LUNGS EVERY 4 (FOUR) HOURS AS NEEDED FOR WHEEZING OR SHORTNESS OF BREATH.   Marland Kitchen allopurinol (ZYLOPRIM) 300 MG tablet TAKE ONE TAB AT NIGHT FOR GOUT   . aspirin EC 81 MG tablet Take 81 mg by mouth daily.   Marland Kitchen atorvastatin (LIPITOR) 10 MG tablet Take 1 tablet (10 mg total) by mouth daily.   Marland Kitchen azithromycin (ZITHROMAX) 250 MG tablet Take four tablets at one with food.   . BD PEN NEEDLE NANO U/F 32G X 4 MM MISC USE DAILY WITH VICTOZA   . benazepril (LOTENSIN) 10 MG  tablet Take 1 tablet (10 mg total) by mouth daily.   . Biotin 5 MG CAPS Take 5 mg by mouth daily.    . Budeson-Glycopyrrol-Formoterol (BREZTRI AEROSPHERE) 160-9-4.8 MCG/ACT AERO Inhale 2 puffs into the lungs in the morning and at bedtime.   . cetirizine (ZYRTEC ALLERGY) 10 MG tablet Take 1 tablet (10 mg total) by mouth daily as needed for allergies.   . chlorthalidone (HYGROTON) 25 MG tablet TAKE 1 TABLET BY MOUTH EVERY DAY IN THE MORNING   . clobetasol cream (TEMOVATE) [id number] % Apply 1 application topically 2 (two) times daily as needed (for eczema on hands).    . diclofenac sodium (VOLTAREN) 1 % GEL APPLY 4 G TOPICALLY FOUR (4) TIMES A DAY.   Marland Kitchen docusate sodium (COLACE) 100 MG capsule Take 100 mg by mouth daily.    Marland Kitchen doxycycline (VIBRA-TABS) 100 MG tablet TAKE 1 TABLET BY MOUTH TWICE A DAY   . famotidine (PEPCID) 20 MG tablet Take 20 mg by mouth daily as needed for heartburn or indigestion.    . Ferrous Sulfate (IRON) 325 (65 Fe) MG TABS Take 325 mg by mouth 3 (three) times daily.  01/31/2018: Stopped for procedure  . gabapentin (NEURONTIN) 800 MG tablet Take 800 mg by mouth 3 (three) times daily as needed (for pain).    Marland Kitchen glimepiride (AMARYL) 2 MG tablet Take 1  tablet (2 mg total) by mouth daily before breakfast.   . glucose blood (ACCU-CHEK GUIDE) test strip 1 each by Other route daily. Use as instructed  Once daily diag e11.9   . INCRUSE ELLIPTA 62.5 MCG/INH AEPB TAKE ONE PUFF EVERY DAY   . ipratropium-albuterol (DUONEB) 0.5-2.5 (3) MG/3ML SOLN Take 3 mLs by nebulization every 6 (six) hours as needed (for shortness of breath or wheezing).    Marland Kitchen lidocaine (XYLOCAINE) 5 % ointment APPLY A SMALL AMOUNT TO AFFECTED AREA 2-3 TIMES A DAY   . linaclotide (LINZESS) 145 MCG CAPS capsule TAKE 1 CAPSULE (145 MCG TOTAL) BY MOUTH DAILY BEFORE BREAKFAST.   Marland Kitchen lisinopril (ZESTRIL) 10 MG tablet TAKE 1 TAB DAILY IN MORNING   . metFORMIN (GLUCOPHAGE) 500 MG tablet Take 1 tablet (500 mg total) by mouth 2 (two) times  daily with a meal.   . metoprolol tartrate (LOPRESSOR) 50 MG tablet Take 1 tablet (50 mg total) by mouth 2 (two) times daily.   . montelukast (SINGULAIR) 10 MG tablet TAKE 1 TABLET BY MOUTH DAILY FOR ASTHMA   . ondansetron (ZOFRAN ODT) 4 MG disintegrating tablet Allow 1-2 tablets to dissolve in your mouth every 8 hours as needed for nausea/vomiting   . oxyCODONE-acetaminophen (PERCOCET/ROXICET) 5-325 MG tablet Take 1 tablet by mouth every 6 (six) hours as needed for severe pain.   Marland Kitchen oxymetazoline (AFRIN) 0.05 % nasal spray Place 1 spray into both nostrils 2 (two) times daily as needed for congestion.   . phentermine (ADIPEX-P) 37.5 MG tablet Take 1 tablet (37.5 mg total) by mouth daily before breakfast.   . predniSONE (DELTASONE) 5 MG tablet TAKE 2 TABLETS BY MOUTH EVERY DAY WITH BREAKFAST   . SYMBICORT 160-4.5 MCG/ACT inhaler INHALE 2 PUFF TWICE A DAY   . tiZANidine (ZANAFLEX) 4 MG tablet TAKE 1 TABLET (4 MG TOTAL) BY MOUTH 3 (THREE) TIMES DAILY.   . traMADol (ULTRAM) 50 MG tablet Take 1 tablet (50 mg total) by mouth every 8 (eight) hours as needed for moderate pain.   Marland Kitchen tretinoin (RETIN-A) 0.025 % cream Apply 1 application topically at bedtime as needed (for eczema on face).    Marland Kitchen VICTOZA 18 MG/3ML SOPN INJECT 1.8 MG UNDER THE SKIN ONCE DAILY IN THE MORNING   . warfarin (COUMADIN) 1 MG tablet TAKE ONE TAB AS NEEDED WITH COUMADIN 5 MG   . warfarin (COUMADIN) 5 MG tablet Take a whole tablet on Monday, Tuesday, Thursday, Saturday and Sunday.  Take a 1/2 tablet on Wednesday and Friday.    No facility-administered encounter medications on file as of 09/10/2019.    Surgical History: Past Surgical History:  Procedure Laterality Date  . APPENDECTOMY    . COLONOSCOPY WITH PROPOFOL N/A 02/02/2018   Procedure: COLONOSCOPY WITH PROPOFOL;  Surgeon: Jonathon Bellows, MD;  Location: [facility] ENDOSCOPY;  Service: Gastroenterology;  Laterality: N/A;  . LAPAROSCOPIC APPENDECTOMY N/A 02/05/2018   Procedure: APPENDECTOMY  LAPAROSCOPIC;  Surgeon: Jules Husbands, MD;  Location: [facility] ORS;  Service: General;  Laterality: N/A;  . right arm surgery    . TUBAL LIGATION      Medical History: Past Medical History:  Diagnosis Date  . Asthma   . Atopic dermatitis   . Collagen vascular disease (HCC)    Rhematoid Arthritis  . Constipation   . COPD (chronic obstructive pulmonary disease) ([place])   . Diabetes mellitus without complication ([place])   . DVT (deep venous thrombosis) ([place])   . GERD (gastroesophageal reflux disease)   .  Hyperlipidemia   . Hypertension   . Rheumatoid arthritis ([place])     Family History: Family History  Problem Relation Age of Onset  . Breast cancer Mother   . Hypertension Mother   . Diabetes Mother   . Hypertension Father   . Heart failure Father     Social History   Socioeconomic History  . Marital status: Single    Spouse name: Not on file  . Number of children: Not on file  . Years of education: Not on file  . Highest education level: Not on file  Occupational History  . Not on file  Tobacco Use  . Smoking status: Former Research scientist (life sciences)  . Smokeless tobacco: Never Used  Substance and Sexual Activity  . Alcohol use: No  . Drug use: No  . Sexual activity: Yes  Other Topics Concern  . Not on file  Social History Narrative  . Not on file   Social Determinants of Health   Financial Resource Strain:   . Difficulty of Paying Living Expenses:   Food Insecurity:   . Worried About Charity fundraiser in the Last Year:   . Arboriculturist in the Last Year:   Transportation Needs:   . Film/video editor (Medical):   Marland Kitchen Lack of Transportation (Non-Medical):   Physical Activity:   . Days of Exercise per Week:   . Minutes of Exercise per Session:   Stress:   . Feeling of Stress :   Social Connections:   . Frequency of Communication with Friends and Family:   . Frequency of Social Gatherings with Friends and Family:   . Attends Religious Services:   . Active Member of Clubs  or Organizations:   . Attends Archivist Meetings:   Marland Kitchen Marital Status:   Intimate Partner Violence:   . Fear of Current or Ex-Partner:   . Emotionally Abused:   Marland Kitchen Physically Abused:   . Sexually Abused:       Review of Systems  Constitutional: Negative for chills, fatigue and unexpected weight change.  HENT: Negative for congestion, rhinorrhea, sneezing and sore throat.   Eyes: Negative for photophobia, pain and redness.  Respiratory: Negative for cough, chest tightness and shortness of breath.   Cardiovascular: Negative for chest pain and palpitations.  Gastrointestinal: Negative for abdominal pain, constipation, diarrhea, nausea and vomiting.  Endocrine: Negative.   Genitourinary: Negative for dysuria and frequency.  Musculoskeletal: Negative for arthralgias, back pain, joint swelling and neck pain.  Skin: Negative for rash.  Allergic/Immunologic: Negative.   Neurological: Negative for tremors and numbness.  Hematological: Negative for adenopathy. Does not bruise/bleed easily.  Psychiatric/Behavioral: Negative for behavioral problems and sleep disturbance. The patient is not nervous/anxious.     Vital Signs: BP 125/83   Pulse 73   Temp (!) 97.4 F (36.3 C)   Resp 16   Ht 5\' 5"  (1.651 m)   Wt 250 lb (113.4 kg)   SpO2 97% Comment: unable to get oxygen due to nails  BMI 41.60 kg/m    Physical Exam Vitals and nursing note reviewed.  Constitutional:      General: She is not in acute distress.    Appearance: She is well-developed. She is not diaphoretic.  HENT:     Head: Normocephalic and atraumatic.     Mouth/Throat:     Pharynx: No oropharyngeal exudate.  Eyes:     Pupils: Pupils are equal, round, and reactive to light.  Neck:  Thyroid: No thyromegaly.     Vascular: No JVD.     Trachea: No tracheal deviation.  Cardiovascular:     Rate and Rhythm: Normal rate and regular rhythm.     Heart sounds: Normal heart sounds. No murmur. No friction rub. No  gallop.   Pulmonary:     Effort: Pulmonary effort is normal. No respiratory distress.     Breath sounds: Normal breath sounds. No wheezing or rales.  Chest:     Chest wall: No tenderness.  Abdominal:     Palpations: Abdomen is soft.     Tenderness: There is no abdominal tenderness. There is no guarding.  Musculoskeletal:        General: Normal range of motion.     Cervical back: Normal range of motion and neck supple.  Lymphadenopathy:     Cervical: No cervical adenopathy.  Skin:    General: Skin is warm and dry.  Neurological:     Mental Status: She is alert and oriented to person, place, and time.     Cranial Nerves: No cranial nerve deficit.  Psychiatric:        Behavior: Behavior normal.        Thought Content: Thought content normal.        Judgment: Judgment normal.     Assessment/Plan: 1. Essential (primary) hypertension Stable, continue current meds.   2. Uncontrolled type 2 diabetes mellitus with hyperglycemia ([place]) Discussed medication compliance as well as importance of monitoring blood sugar.  She verbalized understanding, and will follow up in 4 weeks for A1C check.    3. BMI 40.0-44.9, adult ([place]) IF patient has not lost weight at next visit, will discontinue use of phentermine.  Obesity Counseling: Risk Assessment: An assessment of behavioral risk factors was made today and includes lack of exercise sedentary lifestyle, lack of portion control and poor dietary habits.  Risk Modification Advice: She was counseled on portion control guidelines. Restricting daily caloric intake to 1600. The detrimental long term effects of obesity on her health and ongoing poor compliance was also discussed with the patient.  There is a liability release in patients' chart. There has been a 10 minute discussion about the side effects including but not limited to elevated blood pressure, anxiety, lack of sleep and dry mouth. Pt understands and will like to start/continue on appetite  suppressant at this time. There will be one month RX given at the time of visit with proper follow up. [place] diet plan with restricted calories is given to the pt. Pt understands and agrees with  plan of treatment - phentermine (ADIPEX-P) 37.5 MG tablet; Take 1 tablet (37.5 mg total) by mouth daily before breakfast.  Dispense: 30 tablet; Refill: 0  4. Morbid obesity (HCC) BMI over 40.   5. Mixed hyperlipidemia Stable, continue with statin therapy.   6. Chronic obstructive pulmonary disease, unspecified COPD type ([place]) Refilled Prednisone, discussed use.  - predniSONE (DELTASONE) 5 MG tablet; .TAKE 2 TABLETS BY MOUTH EVERY DAY WITH BREAKFAST  Dispense: 60 tablet; Refill: 0  7. Long term (current) use of anticoagulants Stable, continue current dosing of Coumadin.  - POCT INR  General Counseling: Gladis verbalizes understanding of the findings of todays visit and agrees with plan of treatment. I have discussed any further diagnostic evaluation that may be needed or ordered today. We also reviewed her medications today. she has been encouraged to call the office with any questions or concerns that should arise related to todays visit.  Orders Placed This Encounter  Procedures  . POCT INR    No orders of the defined types were placed in this encounter.   Time spent: 30 Minutes   This patient was seen by Orson Gear AGNP-C in Collaboration with Dr Lavera Guise as a part of collaborative care agreement     Kendell Bane AGNP-C Internal medicine

## 2019-09-13 ENCOUNTER — Other Ambulatory Visit: Payer: Self-pay | Admitting: Adult Health

## 2019-10-01 ENCOUNTER — Telehealth: Payer: Self-pay

## 2019-10-01 NOTE — Telephone Encounter (Signed)
Called lmom informing patient of appointment on 10/03/2019. klh °

## 2019-10-02 ENCOUNTER — Telehealth: Payer: Self-pay

## 2019-10-02 ENCOUNTER — Other Ambulatory Visit: Payer: Self-pay

## 2019-10-02 DIAGNOSIS — B009 Herpesviral infection, unspecified: Secondary | ICD-10-CM

## 2019-10-02 MED ORDER — ACYCLOVIR 400 MG PO TABS: 400.0000 mg | ORAL_TABLET | Freq: Two times a day (BID) | ORAL | 3 refills | Status: DC

## 2019-10-02 NOTE — Telephone Encounter (Signed)
Confirmed and screened for 10-03-19 ov.

## 2019-10-03 ENCOUNTER — Encounter: Payer: Self-pay | Admitting: Internal Medicine

## 2019-10-03 ENCOUNTER — Ambulatory Visit (INDEPENDENT_AMBULATORY_CARE_PROVIDER_SITE_OTHER): Payer: Medicaid Other | Admitting: Internal Medicine

## 2019-10-03 ENCOUNTER — Other Ambulatory Visit: Payer: Self-pay

## 2019-10-03 VITALS — BP 120/61 | HR 68 | Temp 97.1°F | Resp 16 | Ht 65.0 in | Wt 250.0 lb

## 2019-10-03 DIAGNOSIS — J449 Chronic obstructive pulmonary disease, unspecified: Secondary | ICD-10-CM

## 2019-10-03 DIAGNOSIS — R0602 Shortness of breath: Secondary | ICD-10-CM

## 2019-10-03 DIAGNOSIS — I82721 Chronic embolism and thrombosis of deep veins of right upper extremity: Secondary | ICD-10-CM | POA: Diagnosis not present

## 2019-10-03 DIAGNOSIS — K219 Gastro-esophageal reflux disease without esophagitis: Secondary | ICD-10-CM

## 2019-10-03 NOTE — Progress Notes (Signed)
[facility] [place], [place] [address]  Pulmonary Sleep Medicine   Office Visit Note  Patient Name: Brenda Santana DOB: [birth year] MRN [id number]  Date of Service: 10/03/2019  Complaints/HPI: She is being seen for multiple medical problems including noncompliance with sleep apnea history of chronic obstructive asthma and COPD.  Patient states that she is not able to tolerate and not wanting to use the CPAP device.  She has been counseled numerous times on this.  She has asthma which seems to be under decent control at this time she continues to use her inhalers.  As far as obesity her BMI was 44.6 she does need to work on diet and exercise to help her to lose weight this would also help with her obstructive sleep apnea issues.  ROS  General: (-) fever, (-) chills, (-) night sweats, (-) weakness Skin: (-) rashes, (-) itching,. Eyes: (-) visual changes, (-) redness, (-) itching. Nose and Sinuses: (-) nasal stuffiness or itchiness, (-) postnasal drip, (-) nosebleeds, (-) sinus trouble. Mouth and Throat: (-) sore throat, (-) hoarseness. Neck: (-) swollen glands, (-) enlarged thyroid, (-) neck pain. Respiratory: - cough, (-) bloody sputum, - shortness of breath, - wheezing. Cardiovascular: - ankle swelling, (-) chest pain. Lymphatic: (-) lymph node enlargement. Neurologic: (-) numbness, (-) tingling. Psychiatric: (-) anxiety, (-) depression   Current Medication: Outpatient Encounter Medications as of 10/03/2019  Medication Sig Note  . Accu-Chek FastClix Lancets MISC Use as directed once a daily diag E11.9   . acyclovir (ZOVIRAX) 400 MG tablet Take 1 tablet (400 mg total) by mouth 2 (two) times daily.   Marland Kitchen acyclovir ointment (ZOVIRAX) 5 % APPLY TOPICALLY EVERY 3 (THREE) HOURS.   Marland Kitchen albuterol (PROAIR HFA) 108 (90 Base) MCG/ACT inhaler INHALE 2 PUFFS INTO THE LUNGS EVERY 4 (FOUR) HOURS AS NEEDED FOR WHEEZING OR SHORTNESS OF BREATH.   Marland Kitchen allopurinol (ZYLOPRIM) 300 MG  tablet TAKE ONE TAB AT NIGHT FOR GOUT   . aspirin EC 81 MG tablet Take 81 mg by mouth daily.   Marland Kitchen atorvastatin (LIPITOR) 10 MG tablet Take 1 tablet (10 mg total) by mouth daily.   Marland Kitchen azithromycin (ZITHROMAX) 250 MG tablet Take four tablets at one with food.   . BD PEN NEEDLE NANO U/F 32G X 4 MM MISC USE DAILY WITH VICTOZA   . benazepril (LOTENSIN) 10 MG tablet Take 1 tablet (10 mg total) by mouth daily.   . Biotin 5 MG CAPS Take 5 mg by mouth daily.    . Budeson-Glycopyrrol-Formoterol (BREZTRI AEROSPHERE) 160-9-4.8 MCG/ACT AERO Inhale 2 puffs into the lungs in the morning and at bedtime.   . cetirizine (ZYRTEC ALLERGY) 10 MG tablet Take 1 tablet (10 mg total) by mouth daily as needed for allergies.   . chlorthalidone (HYGROTON) 25 MG tablet TAKE 1 TABLET BY MOUTH EVERY DAY IN THE MORNING   . clobetasol cream (TEMOVATE) [id number] % Apply 1 application topically 2 (two) times daily as needed (for eczema on hands).    . diclofenac sodium (VOLTAREN) 1 % GEL APPLY 4 G TOPICALLY FOUR (4) TIMES A DAY.   Marland Kitchen docusate sodium (COLACE) 100 MG capsule Take 100 mg by mouth daily.    Marland Kitchen doxycycline (VIBRA-TABS) 100 MG tablet TAKE 1 TABLET BY MOUTH TWICE A DAY   . famotidine (PEPCID) 20 MG tablet Take 20 mg by mouth daily as needed for heartburn or indigestion.    . Ferrous Sulfate (IRON) 325 (65 Fe) MG TABS Take 325  mg by mouth 3 (three) times daily.  01/31/2018: Stopped for procedure  . gabapentin (NEURONTIN) 800 MG tablet Take 800 mg by mouth 3 (three) times daily as needed (for pain).    Marland Kitchen glimepiride (AMARYL) 2 MG tablet Take 1 tablet (2 mg total) by mouth daily before breakfast.   . glucose blood (ACCU-CHEK GUIDE) test strip 1 each by Other route daily. Use as instructed  Once daily diag e11.9   . INCRUSE ELLIPTA 62.5 MCG/INH AEPB TAKE ONE PUFF EVERY DAY   . ipratropium-albuterol (DUONEB) 0.5-2.5 (3) MG/3ML SOLN Take 3 mLs by nebulization every 6 (six) hours as needed (for shortness of breath or wheezing).    Marland Kitchen  lidocaine (XYLOCAINE) 5 % ointment APPLY A SMALL AMOUNT TO AFFECTED AREA 2-3 TIMES A DAY   . linaclotide (LINZESS) 145 MCG CAPS capsule TAKE 1 CAPSULE (145 MCG TOTAL) BY MOUTH DAILY BEFORE BREAKFAST.   Marland Kitchen lisinopril (ZESTRIL) 10 MG tablet TAKE 1 TAB DAILY IN MORNING   . metFORMIN (GLUCOPHAGE) 500 MG tablet Take 1 tablet (500 mg total) by mouth 2 (two) times daily with a meal.   . metoprolol tartrate (LOPRESSOR) 50 MG tablet Take 1 tablet (50 mg total) by mouth 2 (two) times daily.   . montelukast (SINGULAIR) 10 MG tablet TAKE 1 TABLET BY MOUTH DAILY FOR ASTHMA   . nystatin (NYSTATIN) powder Apply 1 application topically in the morning, at noon, in the evening, and at bedtime.   . ondansetron (ZOFRAN ODT) 4 MG disintegrating tablet Allow 1-2 tablets to dissolve in your mouth every 8 hours as needed for nausea/vomiting   . oxyCODONE-acetaminophen (PERCOCET/ROXICET) 5-325 MG tablet Take 1 tablet by mouth every 6 (six) hours as needed for severe pain.   Marland Kitchen oxymetazoline (AFRIN) 0.05 % nasal spray Place 1 spray into both nostrils 2 (two) times daily as needed for congestion.   . phentermine (ADIPEX-P) 37.5 MG tablet Take 1 tablet (37.5 mg total) by mouth daily before breakfast.   . predniSONE (DELTASONE) 5 MG tablet .TAKE 2 TABLETS BY MOUTH EVERY DAY WITH BREAKFAST   . SYMBICORT 160-4.5 MCG/ACT inhaler INHALE 2 PUFF TWICE A DAY   . tiZANidine (ZANAFLEX) 4 MG tablet TAKE 1 TABLET (4 MG TOTAL) BY MOUTH 3 (THREE) TIMES DAILY.   . traMADol (ULTRAM) 50 MG tablet Take 1 tablet (50 mg total) by mouth every 8 (eight) hours as needed for moderate pain.   Marland Kitchen tretinoin (RETIN-A) 0.025 % cream Apply 1 application topically at bedtime as needed (for eczema on face).    Marland Kitchen VICTOZA 18 MG/3ML SOPN INJECT 1.8 MG UNDER THE SKIN ONCE DAILY IN THE MORNING   . warfarin (COUMADIN) 1 MG tablet TAKE ONE TAB AS NEEDED WITH COUMADIN 5 MG   . warfarin (COUMADIN) 5 MG tablet Take a whole tablet on Monday, Tuesday, Thursday, Saturday  and Sunday.  Take a 1/2 tablet on Wednesday and Friday.    No facility-administered encounter medications on file as of 10/03/2019.    Surgical History: Past Surgical History:  Procedure Laterality Date  . APPENDECTOMY    . COLONOSCOPY WITH PROPOFOL N/A 02/02/2018   Procedure: COLONOSCOPY WITH PROPOFOL;  Surgeon: Jonathon Bellows, MD;  Location: [facility] ENDOSCOPY;  Service: Gastroenterology;  Laterality: N/A;  . LAPAROSCOPIC APPENDECTOMY N/A 02/05/2018   Procedure: APPENDECTOMY LAPAROSCOPIC;  Surgeon: Jules Husbands, MD;  Location: [facility] ORS;  Service: General;  Laterality: N/A;  . right arm surgery    . [place]  History: Past Medical History:  Diagnosis Date  . Asthma   . Atopic dermatitis   . Collagen vascular disease (HCC)    Rhematoid Arthritis  . Constipation   . COPD (chronic obstructive pulmonary disease) ([place])   . Diabetes mellitus without complication ([place])   . DVT (deep venous thrombosis) ([place])   . GERD (gastroesophageal reflux disease)   . Hyperlipidemia   . Hypertension   . Rheumatoid arthritis ([place])     Family History: Family History  Problem Relation Age of Onset  . Breast cancer Mother   . Hypertension Mother   . Diabetes Mother   . Hypertension Father   . Heart failure Father     Social History: Social History   Socioeconomic History  . Marital status: Single    Spouse name: Not on file  . Number of children: Not on file  . Years of education: Not on file  . Highest education level: Not on file  Occupational History  . Not on file  Tobacco Use  . Smoking status: Former Research scientist (life sciences)  . Smokeless tobacco: Never Used  Substance and Sexual Activity  . Alcohol use: No  . Drug use: No  . Sexual activity: Yes  Other Topics Concern  . Not on file  Social History Narrative  . Not on file   Social Determinants of Health   Financial Resource Strain:   . Difficulty of Paying Living Expenses:   Food Insecurity:   . Worried About Ship broker in the Last Year:   . Arboriculturist in the Last Year:   Transportation Needs:   . Film/video editor (Medical):   Marland Kitchen Lack of Transportation (Non-Medical):   Physical Activity:   . Days of Exercise per Week:   . Minutes of Exercise per Session:   Stress:   . Feeling of Stress :   Social Connections:   . Frequency of Communication with Friends and Family:   . Frequency of Social Gatherings with Friends and Family:   . Attends Religious Services:   . Active Member of Clubs or Organizations:   . Attends Archivist Meetings:   Marland Kitchen Marital Status:   Intimate Partner Violence:   . Fear of Current or Ex-Partner:   . Emotionally Abused:   Marland Kitchen Physically Abused:   . Sexually Abused:     Vital Signs: Blood pressure 120/61, pulse 68, temperature (!) 97.1 F (36.2 C), resp. rate 16, height 5\' 5"  (1.651 m), weight 250 lb (113.4 kg), SpO2 98 %.  Examination: General Appearance: The patient is well-developed, well-nourished, and in no distress. Skin: Gross inspection of skin unremarkable. Head: normocephalic, no gross deformities. Eyes: no gross deformities noted. ENT: ears appear grossly normal no exudates. Neck: Supple. No thyromegaly. No LAD. Respiratory: No rhonchi no rales noted at this time. Cardiovascular: Normal S1 and S2 without murmur or rub. Extremities: No cyanosis. pulses are equal. Neurologic: Alert and oriented. No involuntary movements.  LABS: Recent Results (from the past 2160 hour(s))  POCT HgB A1C     Status: Abnormal   Collection Time: 07/09/19  9:05 AM  Result Value Ref Range   Hemoglobin A1C 7.6 (A) 4.0 - 5.6 %   HbA1c POC (<> result, manual entry)     HbA1c, POC (prediabetic range)     HbA1c, POC (controlled diabetic range)    POCT INR     Status: Abnormal   Collection Time: 08/09/19 10:37 AM  Result Value Ref Range  INR 3.2 (A) 2.0 - 3.0    Comment: PT 38.3  POCT INR     Status: None   Collection Time: 09/10/19  8:43 AM  Result Value  Ref Range   INR 2.9 2.0 - 3.0    Comment: inr is 2.9 and pt is 35    Radiology: MM 3D SCREEN BREAST BILATERAL  Result Date: 06/21/2019 CLINICAL DATA:  Screening. EXAM: DIGITAL SCREENING BILATERAL MAMMOGRAM WITH TOMO AND CAD COMPARISON:  Previous exam(s). ACR Breast Density Category b: There are scattered areas of fibroglandular density. FINDINGS: There are no findings suspicious for malignancy. Images were processed with CAD. IMPRESSION: No mammographic evidence of malignancy. A result letter of this screening mammogram will be mailed directly to the patient. RECOMMENDATION: Screening mammogram in one year. (Code:SM-B-01Y) BI-RADS CATEGORY  1: Negative. Electronically Signed   By: Nolon Nations M.D.   On: 06/21/2019 13:14    No results found.  No results found.    Assessment and Plan: Patient Active Problem List   Diagnosis Date Noted  . Abscess of axilla, right 03/01/2019  . Pain in right upper arm 03/01/2019  . Type 2 diabetes mellitus with hyperglycemia ([place]) 03/01/2019  . Long term (current) use of systemic steroids 03/01/2019  . Long term (current) use of anticoagulants 01/17/2018  . Appendicitis with perforation   . Uncontrolled type 2 diabetes mellitus with hyperglycemia ([place]) 11/09/2017  . 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 Mucopurulent chronic bronchitis ([place]) 11/09/2017  . Personal history of venous thrombosis and embolism 10/06/2017  . Phlebitis and thrombophlebitis of other sites 06/09/2017  . Obstructive sleep apnea of adult 06/09/2017  . Gout, unspecified 06/09/2017  . Herpesviral vulvovaginitis 06/09/2017  . Type 2 diabetes mellitus with diabetic neuropathy, unspecified ([place]) 06/09/2017  . Essential (primary) hypertension 06/09/2017  . Chronic anticoagulation 06/09/2017  . Pain medication agreement signed 01/05/2017  . Subacromial bursitis of left shoulder joint 10/19/2016  . Chronic shoulder bursitis, right 06/21/2016  . Chronic pain of left knee 07/14/2014  .  Myofascial muscle pain 07/14/2014  . Low back pain 03/18/2014  . Chronic obstructive pulmonary disease ([place]) 05/01/2013  . Upper respiratory infection 05/01/2013  . Acne 09/12/2012  . Hand dermatitis 09/12/2012  . DVT (deep venous thrombosis) ([place]) 08/31/2012  . Constipation 08/16/2012  . Esophageal reflux disease 08/16/2012  . Asthma 04/10/2012  . Tobacco use disorder 04/02/2012  . Morbid obesity ([place]) 03/01/2012  . Dermatitis 01/11/2012  . Depressive disorder 06/22/2010  . Diabetes mellitus without complication ([place]) [id number]  . Encounter for current long-term use of anticoagulants 08/17/2009    1. OSA not using CPAP. She states she was not able to tolerate the CPAP. She states she feels that she is sleeping fine. She has not had any issues with daytime somnolence 2. Asthma?COPD we will recommend continuing with the current management I recommended getting complete pulmonary functions done also to reassess 3. Morbid obesity BMI as noted is 44.06 needs to work on diet exercise and weight loss as tolerated 4. GERD PPI as tolerated 5. DVT on coumadin  General Counseling: I have discussed the findings of the evaluation and examination with Judeen Hammans.  I have also discussed any further diagnostic evaluation thatmay be needed or ordered today. Glendora verbalizes understanding of the findings of todays visit. We also reviewed her medications today and discussed drug interactions and side effects including but not limited excessive drowsiness and altered mental states. We also discussed that there is always a risk not just to her but  also people around her. she has been encouraged to call the office with any questions or concerns that should arise related to todays visit.  Orders Placed This Encounter  Procedures  . DG Chest 2 View    Standing Status:   Future    Standing Expiration Date:   10/02/2020    Order Specific Question:   Reason for Exam (SYMPTOM  OR DIAGNOSIS REQUIRED)    Answer:    SOB    Order Specific Question:   Is patient pregnant?    Answer:   No    Order Specific Question:   Preferred imaging location?    Answer:   [facility]    Order Specific Question:   Radiology Contrast Protocol - do NOT remove file path    Answer:   [email or web address]  . Spirometry with Graph    Order Specific Question:   Where should this test be performed?    Answer:   Other  . Pulmonary function test    Standing Status:   Future    Standing Expiration Date:   10/02/2020    Order Specific Question:   Where should this test be performed?    Answer:   [facility]     Time spent: 42min  I have personally obtained a history, examined the patient, evaluated laboratory and imaging results, formulated the assessment and plan and placed orders.    Allyne Gee, MD [facility] Pulmonary and Critical Care Sleep medicine

## 2019-10-03 NOTE — Patient Instructions (Signed)

## 2019-10-04 ENCOUNTER — Telehealth: Payer: Self-pay

## 2019-10-04 NOTE — Telephone Encounter (Signed)
Lmom to confirm and screen for 10-08-19 ov. 

## 2019-10-07 ENCOUNTER — Other Ambulatory Visit: Payer: Self-pay | Admitting: Adult Health

## 2019-10-07 DIAGNOSIS — J449 Chronic obstructive pulmonary disease, unspecified: Secondary | ICD-10-CM

## 2019-10-07 DIAGNOSIS — E782 Mixed hyperlipidemia: Secondary | ICD-10-CM

## 2019-10-08 ENCOUNTER — Ambulatory Visit (INDEPENDENT_AMBULATORY_CARE_PROVIDER_SITE_OTHER): Payer: Medicaid Other | Admitting: Adult Health

## 2019-10-08 ENCOUNTER — Other Ambulatory Visit: Payer: Self-pay

## 2019-10-08 VITALS — BP 124/71 | HR 79 | Temp 97.3°F | Resp 16 | Ht 65.0 in | Wt 251.8 lb

## 2019-10-08 DIAGNOSIS — I10 Essential (primary) hypertension: Secondary | ICD-10-CM

## 2019-10-08 DIAGNOSIS — E782 Mixed hyperlipidemia: Secondary | ICD-10-CM

## 2019-10-08 DIAGNOSIS — Z6841 Body Mass Index (BMI) 40.0 and over, adult: Secondary | ICD-10-CM

## 2019-10-08 DIAGNOSIS — K219 Gastro-esophageal reflux disease without esophagitis: Secondary | ICD-10-CM

## 2019-10-08 DIAGNOSIS — J449 Chronic obstructive pulmonary disease, unspecified: Secondary | ICD-10-CM

## 2019-10-08 DIAGNOSIS — I82721 Chronic embolism and thrombosis of deep veins of right upper extremity: Secondary | ICD-10-CM

## 2019-10-08 DIAGNOSIS — E1165 Type 2 diabetes mellitus with hyperglycemia: Secondary | ICD-10-CM

## 2019-10-08 LAB — POCT GLYCOSYLATED HEMOGLOBIN (HGB A1C): Hemoglobin A1C: 7.2 % — AB (ref 4.0–5.6)

## 2019-10-08 MED ORDER — PREDNISONE 5 MG PO TABS: ORAL_TABLET | ORAL | 3 refills | Status: DC

## 2019-10-08 MED ORDER — PHENTERMINE HCL 37.5 MG PO TABS: 37.5000 mg | ORAL_TABLET | Freq: Every day | ORAL | 0 refills | Status: DC

## 2019-10-08 NOTE — Progress Notes (Signed)
[facility] [place], [place] [address]  Internal MEDICINE  Office Visit Note  Patient Name: Brenda Santana  [id number]  [id number]  Date of Service: 10/08/2019  Chief Complaint  Patient presents with  . Follow-up    weight loss  . Diabetes  . Gastroesophageal Reflux  . Hyperlipidemia  . Hypertension  . Medication Refill  . Quality Metric Gaps    eye exam    HPI  Pt is here for follow up on DM, GERD, HTN, HLD and Weight loss.  Her A1C is improving, currently 7.2 from 7.6.  She reports overall she has been doing well.  She had an uncle die, and her large family all gathered with lots of food.  She tried not to partake, however it was difficulty. Patient is using phentermine for weight loss.  Since our last visit they have gained 1 pounds.  The patient denies any chest pain, palpitations, shortness of breath, constipation, headaches or any other side effects of the medication.  Patient wishes to continue to use this medication for weight loss at this time.    Current Medication: Outpatient Encounter Medications as of 10/08/2019  Medication Sig Note  . Accu-Chek FastClix Lancets MISC Use as directed once a daily diag E11.9   . acyclovir (ZOVIRAX) 400 MG tablet Take 1 tablet (400 mg total) by mouth 2 (two) times daily.   Marland Kitchen acyclovir ointment (ZOVIRAX) 5 % APPLY TOPICALLY EVERY 3 (THREE) HOURS.   Marland Kitchen albuterol (PROAIR HFA) 108 (90 Base) MCG/ACT inhaler INHALE 2 PUFFS INTO THE LUNGS EVERY 4 (FOUR) HOURS AS NEEDED FOR WHEEZING OR SHORTNESS OF BREATH.   Marland Kitchen allopurinol (ZYLOPRIM) 300 MG tablet TAKE ONE TAB AT NIGHT FOR GOUT   . aspirin EC 81 MG tablet Take 81 mg by mouth daily.   Marland Kitchen atorvastatin (LIPITOR) 10 MG tablet TAKE 1 TABLET BY MOUTH EVERY DAY   . azithromycin (ZITHROMAX) 250 MG tablet Take four tablets at one with food.   . BD PEN NEEDLE NANO U/F 32G X 4 MM MISC USE DAILY WITH VICTOZA   . benazepril (LOTENSIN) 10 MG tablet Take 1 tablet (10 mg total) by mouth  daily.   . Biotin 5 MG CAPS Take 5 mg by mouth daily.    . Budeson-Glycopyrrol-Formoterol (BREZTRI AEROSPHERE) 160-9-4.8 MCG/ACT AERO Inhale 2 puffs into the lungs in the morning and at bedtime.   . cetirizine (ZYRTEC ALLERGY) 10 MG tablet Take 1 tablet (10 mg total) by mouth daily as needed for allergies.   . chlorthalidone (HYGROTON) 25 MG tablet TAKE 1 TABLET BY MOUTH EVERY DAY IN THE MORNING   . clobetasol cream (TEMOVATE) 7.51 % Apply 1 application topically 2 (two) times daily as needed (for eczema on hands).    . diclofenac sodium (VOLTAREN) 1 % GEL APPLY 4 G TOPICALLY FOUR (4) TIMES A DAY.   Marland Kitchen docusate sodium (COLACE) 100 MG capsule Take 100 mg by mouth daily.    Marland Kitchen doxycycline (VIBRA-TABS) 100 MG tablet TAKE 1 TABLET BY MOUTH TWICE A DAY   . famotidine (PEPCID) 20 MG tablet Take 20 mg by mouth daily as needed for heartburn or indigestion.    . Ferrous Sulfate (IRON) 325 (65 Fe) MG TABS Take 325 mg by mouth 3 (three) times daily.  01/31/2018: Stopped for procedure  . gabapentin (NEURONTIN) 800 MG tablet Take 800 mg by mouth 3 (three) times daily as needed (for pain).    Marland Kitchen glimepiride (AMARYL) 2 MG tablet Take  1 tablet (2 mg total) by mouth daily before breakfast.   . glucose blood (ACCU-CHEK GUIDE) test strip 1 each by Other route daily. Use as instructed  Once daily diag e11.9   . INCRUSE ELLIPTA 62.5 MCG/INH AEPB TAKE ONE PUFF EVERY DAY   . ipratropium-albuterol (DUONEB) 0.5-2.5 (3) MG/3ML SOLN Take 3 mLs by nebulization every 6 (six) hours as needed (for shortness of breath or wheezing).    Marland Kitchen lidocaine (XYLOCAINE) 5 % ointment APPLY A SMALL AMOUNT TO AFFECTED AREA 2-3 TIMES A DAY   . linaclotide (LINZESS) 145 MCG CAPS capsule TAKE 1 CAPSULE (145 MCG TOTAL) BY MOUTH DAILY BEFORE BREAKFAST.   Marland Kitchen lisinopril (ZESTRIL) 10 MG tablet TAKE 1 TAB DAILY IN MORNING   . metFORMIN (GLUCOPHAGE) 500 MG tablet Take 1 tablet (500 mg total) by mouth 2 (two) times daily with a meal.   . metoprolol tartrate  (LOPRESSOR) 50 MG tablet Take 1 tablet (50 mg total) by mouth 2 (two) times daily.   . montelukast (SINGULAIR) 10 MG tablet TAKE 1 TABLET BY MOUTH DAILY FOR ASTHMA   . nystatin (NYSTATIN) powder Apply 1 application topically in the morning, at noon, in the evening, and at bedtime.   . ondansetron (ZOFRAN ODT) 4 MG disintegrating tablet Allow 1-2 tablets to dissolve in your mouth every 8 hours as needed for nausea/vomiting   . oxyCODONE-acetaminophen (PERCOCET/ROXICET) 5-325 MG tablet Take 1 tablet by mouth every 6 (six) hours as needed for severe pain.   Marland Kitchen oxymetazoline (AFRIN) 0.05 % nasal spray Place 1 spray into both nostrils 2 (two) times daily as needed for congestion.   . phentermine (ADIPEX-P) 37.5 MG tablet Take 1 tablet (37.5 mg total) by mouth daily before breakfast.   . predniSONE (DELTASONE) 5 MG tablet .TAKE 2 TABLETS BY MOUTH EVERY DAY WITH BREAKFAST   . SYMBICORT 160-4.5 MCG/ACT inhaler INHALE 2 PUFF TWICE A DAY   . tiZANidine (ZANAFLEX) 4 MG tablet TAKE 1 TABLET (4 MG TOTAL) BY MOUTH 3 (THREE) TIMES DAILY.   . traMADol (ULTRAM) 50 MG tablet Take 1 tablet (50 mg total) by mouth every 8 (eight) hours as needed for moderate pain.   Marland Kitchen tretinoin (RETIN-A) 0.025 % cream Apply 1 application topically at bedtime as needed (for eczema on face).    Marland Kitchen VICTOZA 18 MG/3ML SOPN INJECT 1.8 MG UNDER THE SKIN ONCE DAILY IN THE MORNING   . warfarin (COUMADIN) 1 MG tablet TAKE ONE TAB AS NEEDED WITH COUMADIN 5 MG   . warfarin (COUMADIN) 5 MG tablet Take a whole tablet on Monday, Tuesday, Thursday, Saturday and Sunday.  Take a 1/2 tablet on Wednesday and Friday.    No facility-administered encounter medications on file as of 10/08/2019.    Surgical History: Past Surgical History:  Procedure Laterality Date  . APPENDECTOMY    . COLONOSCOPY WITH PROPOFOL N/A 02/02/2018   Procedure: COLONOSCOPY WITH PROPOFOL;  Surgeon: Jonathon Bellows, MD;  Location: [facility] ENDOSCOPY;  Service: Gastroenterology;  Laterality:  N/A;  . LAPAROSCOPIC APPENDECTOMY N/A 02/05/2018   Procedure: APPENDECTOMY LAPAROSCOPIC;  Surgeon: Jules Husbands, MD;  Location: [facility] ORS;  Service: General;  Laterality: N/A;  . right arm surgery    . TUBAL LIGATION      Medical History: Past Medical History:  Diagnosis Date  . Asthma   . Atopic dermatitis   . Collagen vascular disease (HCC)    Rhematoid Arthritis  . Constipation   . COPD (chronic obstructive pulmonary disease) ([place])   .  Diabetes mellitus without complication ([place])   . DVT (deep venous thrombosis) ([place])   . GERD (gastroesophageal reflux disease)   . Hyperlipidemia   . Hypertension   . Rheumatoid arthritis ([place])     Family History: Family History  Problem Relation Age of Onset  . Breast cancer Mother   . Hypertension Mother   . Diabetes Mother   . Hypertension Father   . Heart failure Father     Social History   Socioeconomic History  . Marital status: Single    Spouse name: Not on file  . Number of children: Not on file  . Years of education: Not on file  . Highest education level: Not on file  Occupational History  . Not on file  Tobacco Use  . Smoking status: Former Research scientist (life sciences)  . Smokeless tobacco: Never Used  Substance and Sexual Activity  . Alcohol use: No  . Drug use: No  . Sexual activity: Yes  Other Topics Concern  . Not on file  Social History Narrative  . Not on file   Social Determinants of Health   Financial Resource Strain:   . Difficulty of Paying Living Expenses:   Food Insecurity:   . Worried About Charity fundraiser in the Last Year:   . Arboriculturist in the Last Year:   Transportation Needs:   . Film/video editor (Medical):   Marland Kitchen Lack of Transportation (Non-Medical):   Physical Activity:   . Days of Exercise per Week:   . Minutes of Exercise per Session:   Stress:   . Feeling of Stress :   Social Connections:   . Frequency of Communication with Friends and Family:   . Frequency of Social Gatherings with  Friends and Family:   . Attends Religious Services:   . Active Member of Clubs or Organizations:   . Attends Archivist Meetings:   Marland Kitchen Marital Status:   Intimate Partner Violence:   . Fear of Current or Ex-Partner:   . Emotionally Abused:   Marland Kitchen Physically Abused:   . Sexually Abused:       Review of Systems  Constitutional: Negative for chills, fatigue and unexpected weight change.  HENT: Negative for congestion, rhinorrhea, sneezing and sore throat.   Eyes: Negative for photophobia, pain and redness.  Respiratory: Negative for cough, chest tightness and shortness of breath.   Cardiovascular: Negative for chest pain and palpitations.  Gastrointestinal: Negative for abdominal pain, constipation, diarrhea, nausea and vomiting.  Endocrine: Negative.   Genitourinary: Negative for dysuria and frequency.  Musculoskeletal: Negative for arthralgias, back pain, joint swelling and neck pain.  Skin: Negative for rash.  Allergic/Immunologic: Negative.   Neurological: Negative for tremors and numbness.  Hematological: Negative for adenopathy. Does not bruise/bleed easily.  Psychiatric/Behavioral: Negative for behavioral problems and sleep disturbance. The patient is not nervous/anxious.     Vital Signs: BP 124/71   Pulse 79   Temp (!) 97.3 F (36.3 C)   Resp 16   Ht 5\' 5"  (1.651 m)   Wt 251 lb 12.8 oz (114.2 kg)   SpO2 96%   BMI 41.90 kg/m    Physical Exam Vitals and nursing note reviewed.  Constitutional:      General: She is not in acute distress.    Appearance: She is well-developed. She is not diaphoretic.  HENT:     Head: Normocephalic and atraumatic.     Mouth/Throat:     Pharynx: No oropharyngeal exudate.  Eyes:  Pupils: Pupils are equal, round, and reactive to light.  Neck:     Thyroid: No thyromegaly.     Vascular: No JVD.     Trachea: No tracheal deviation.  Cardiovascular:     Rate and Rhythm: Normal rate and regular rhythm.     Heart sounds:  Normal heart sounds. No murmur. No friction rub. No gallop.   Pulmonary:     Effort: Pulmonary effort is normal. No respiratory distress.     Breath sounds: Normal breath sounds. No wheezing or rales.  Chest:     Chest wall: No tenderness.  Abdominal:     Palpations: Abdomen is soft.     Tenderness: There is no abdominal tenderness. There is no guarding.  Musculoskeletal:        General: Normal range of motion.     Cervical back: Normal range of motion and neck supple.  Lymphadenopathy:     Cervical: No cervical adenopathy.  Skin:    General: Skin is warm and dry.  Neurological:     Mental Status: She is alert and oriented to person, place, and time.     Cranial Nerves: No cranial nerve deficit.  Psychiatric:        Behavior: Behavior normal.        Thought Content: Thought content normal.        Judgment: Judgment normal.     Assessment/Plan: 1. Uncontrolled type 2 diabetes mellitus with hyperglycemia ([place]) a1c improved to 7.2, continue present management.  Once again discussed lifestyle modifications.  - POCT HgB A1C  2. Gastroesophageal reflux disease without esophagitis Stable, continue current management.   3. Essential (primary) hypertension Stable, continue current medications.   4. Mixed hyperlipidemia Continue Lipitor as prescribed.   5. BMI 40.0-44.9, adult ([place]) FINAL RX for Phentermine, if patient is unable to lose weight again next month.  Obesity Counseling: Risk Assessment: An assessment of behavioral risk factors was made today and includes lack of exercise sedentary lifestyle, lack of portion control and poor dietary habits.  Risk Modification Advice: She was counseled on portion control guidelines. Restricting daily caloric intake to 1500. The detrimental long term effects of obesity on her health and ongoing poor compliance was also discussed with the patient.  There is a liability release in patients' chart. There has been a 10 minute discussion  about the side effects including but not limited to elevated blood pressure, anxiety, lack of sleep and dry mouth. Pt understands and will like to start/continue on appetite suppressant at this time. There will be one month RX given at the time of visit with proper follow up. [place] diet plan with restricted calories is given to the pt. Pt understands and agrees with  plan of treatment - phentermine (ADIPEX-P) 37.5 MG tablet; Take 1 tablet (37.5 mg total) by mouth daily before breakfast.  Dispense: 30 tablet; Refill: 0  6. Morbid obesity ([place]) bmi over 40  7. Chronic obstructive pulmonary disease, unspecified COPD type ([place]) Refilled prednisone and discussed instructions.  - predniSONE (DELTASONE) 5 MG tablet; Take as directed  Dispense: 60 tablet; Refill: 3  8. 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   General Counseling: renia mikelson understanding of the findings of todays visit and agrees with plan of treatment. I have discussed any further diagnostic evaluation that may be needed or ordered today. We also reviewed her medications today. she has been encouraged to call the  office with any questions or concerns that should arise related to todays visit.    Orders Placed This Encounter  Procedures  . POCT HgB A1C    No orders of the defined types were placed in this encounter.   Time spent: 30 Minutes   This patient was seen by Orson Gear AGNP-C in Collaboration with Dr Lavera Guise as a part of collaborative care agreement     Kendell Bane AGNP-C Internal medicine

## 2019-10-16 ENCOUNTER — Other Ambulatory Visit: Payer: Self-pay

## 2019-10-16 ENCOUNTER — Ambulatory Visit: Payer: Medicaid Other | Admitting: Internal Medicine

## 2019-10-30 ENCOUNTER — Emergency Department: Payer: Medicare Other

## 2019-10-30 ENCOUNTER — Other Ambulatory Visit: Payer: Self-pay

## 2019-10-30 ENCOUNTER — Encounter: Payer: Self-pay | Admitting: Emergency Medicine

## 2019-10-30 ENCOUNTER — Emergency Department
Admission: EM | Admit: 2019-10-30 | Discharge: 2019-10-30 | Disposition: A | Discharge status: Home / Self Care | Payer: Medicare Other | Attending: Emergency Medicine | Admitting: Emergency Medicine

## 2019-10-30 DIAGNOSIS — Y939 Activity, unspecified: Secondary | ICD-10-CM | POA: Diagnosis not present

## 2019-10-30 DIAGNOSIS — S7002XA Contusion of left hip, initial encounter: Secondary | ICD-10-CM | POA: Diagnosis not present

## 2019-10-30 DIAGNOSIS — Z79899 Other long term (current) drug therapy: Secondary | ICD-10-CM | POA: Diagnosis not present

## 2019-10-30 DIAGNOSIS — Z87891 Personal history of nicotine dependence: Secondary | ICD-10-CM | POA: Diagnosis not present

## 2019-10-30 DIAGNOSIS — Y92410 Unspecified street and highway as the place of occurrence of the external cause: Secondary | ICD-10-CM | POA: Insufficient documentation

## 2019-10-30 DIAGNOSIS — J449 Chronic obstructive pulmonary disease, unspecified: Secondary | ICD-10-CM | POA: Insufficient documentation

## 2019-10-30 DIAGNOSIS — S0990XA Unspecified injury of head, initial encounter: Secondary | ICD-10-CM | POA: Insufficient documentation

## 2019-10-30 DIAGNOSIS — T148XXA Other injury of unspecified body region, initial encounter: Secondary | ICD-10-CM

## 2019-10-30 DIAGNOSIS — Z7984 Long term (current) use of oral hypoglycemic drugs: Secondary | ICD-10-CM | POA: Diagnosis not present

## 2019-10-30 DIAGNOSIS — E1165 Type 2 diabetes mellitus with hyperglycemia: Secondary | ICD-10-CM | POA: Diagnosis not present

## 2019-10-30 DIAGNOSIS — Y999 Unspecified external cause status: Secondary | ICD-10-CM | POA: Diagnosis not present

## 2019-10-30 DIAGNOSIS — I10 Essential (primary) hypertension: Secondary | ICD-10-CM | POA: Diagnosis not present

## 2019-10-30 DIAGNOSIS — Z7982 Long term (current) use of aspirin: Secondary | ICD-10-CM | POA: Insufficient documentation

## 2019-10-30 DIAGNOSIS — S39012A Strain of muscle, fascia and tendon of lower back, initial encounter: Secondary | ICD-10-CM | POA: Insufficient documentation

## 2019-10-30 DIAGNOSIS — E1140 Type 2 diabetes mellitus with diabetic neuropathy, unspecified: Secondary | ICD-10-CM | POA: Insufficient documentation

## 2019-10-30 MED ORDER — HYDROCODONE-ACETAMINOPHEN 5-325 MG PO TABS
1.0000 | ORAL_TABLET | Freq: Once | ORAL | Status: AC
  Administered 2019-10-30: 1 via ORAL
  Filled 2019-10-30: qty 1

## 2019-10-30 MED ORDER — HYDROCODONE-ACETAMINOPHEN 5-325 MG PO TABS: 1.0000 | ORAL_TABLET | Freq: Four times a day (QID) | ORAL | 0 refills | Status: DC | PRN

## 2019-10-30 MED ORDER — BACLOFEN 10 MG PO TABS: 10.0000 mg | ORAL_TABLET | Freq: Three times a day (TID) | ORAL | 0 refills | Status: AC

## 2019-10-30 NOTE — Discharge Instructions (Signed)
Follow-up with your regular doctor if not better in 5 to 7 days.  Return emergency department worsening.  Take medications as prescribed.  The Vicodin is for acute pain.  If your regular pain medication works you do not need to use this.
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
## 2019-11-01 ENCOUNTER — Other Ambulatory Visit: Payer: Self-pay

## 2019-11-01 ENCOUNTER — Other Ambulatory Visit: Payer: Self-pay | Admitting: Adult Health

## 2019-11-01 ENCOUNTER — Telehealth: Payer: Self-pay

## 2019-11-01 MED ORDER — MONTELUKAST SODIUM 10 MG PO TABS: ORAL_TABLET | ORAL | 1 refills | Status: DC

## 2019-11-01 NOTE — Telephone Encounter (Signed)
Confirmed appointment on 11/06/2019 and screened for covid. kl[facility] 

## 2019-11-02 ENCOUNTER — Other Ambulatory Visit: Payer: Self-pay | Admitting: Adult Health

## 2019-11-02 DIAGNOSIS — E1165 Type 2 diabetes mellitus with hyperglycemia: Secondary | ICD-10-CM

## 2019-11-05 ENCOUNTER — Other Ambulatory Visit: Payer: Self-pay

## 2019-11-05 MED ORDER — ACCU-CHEK GUIDE VI STRP: 1.0000 | ORAL_STRIP | Freq: Every day | 1 refills | Status: DC

## 2019-11-06 ENCOUNTER — Other Ambulatory Visit: Payer: Self-pay

## 2019-11-06 ENCOUNTER — Encounter: Payer: Self-pay | Admitting: Adult Health

## 2019-11-06 ENCOUNTER — Ambulatory Visit (INDEPENDENT_AMBULATORY_CARE_PROVIDER_SITE_OTHER): Payer: Medicaid Other | Admitting: Adult Health

## 2019-11-06 VITALS — BP 121/65 | HR 76 | Temp 97.5°F | Resp 16 | Ht 65.0 in | Wt 253.6 lb

## 2019-11-06 DIAGNOSIS — M5440 Lumbago with sciatica, unspecified side: Secondary | ICD-10-CM | POA: Diagnosis not present

## 2019-11-06 DIAGNOSIS — I10 Essential (primary) hypertension: Secondary | ICD-10-CM

## 2019-11-06 DIAGNOSIS — K219 Gastro-esophageal reflux disease without esophagitis: Secondary | ICD-10-CM | POA: Diagnosis not present

## 2019-11-06 DIAGNOSIS — Z6841 Body Mass Index (BMI) 40.0 and over, adult: Secondary | ICD-10-CM

## 2019-11-06 DIAGNOSIS — E1165 Type 2 diabetes mellitus with hyperglycemia: Secondary | ICD-10-CM
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
## 2019-11-07 ENCOUNTER — Telehealth: Payer: Self-pay

## 2019-11-07 NOTE — Telephone Encounter (Signed)
error 

## 2019-11-14 ENCOUNTER — Other Ambulatory Visit: Payer: Self-pay

## 2019-11-14 ENCOUNTER — Ambulatory Visit
Admission: RE | Admit: 2019-11-14 | Discharge: 2019-11-14 | Disposition: A | Discharge status: Home / Self Care | Payer: Medicare Other | Source: Ambulatory Visit | Attending: Internal Medicine | Admitting: Internal Medicine

## 2019-11-14 ENCOUNTER — Ambulatory Visit
Admission: RE | Admit: 2019-11-14 | Discharge: 2019-11-14 | Disposition: A | Discharge status: Home / Self Care | Payer: Medicare Other | Attending: Internal Medicine | Admitting: Internal Medicine

## 2019-11-14 DIAGNOSIS — R0602 Shortness of breath: Secondary | ICD-10-CM

## 2019-11-21 ENCOUNTER — Other Ambulatory Visit: Payer: Self-pay | Admitting: Adult Health

## 2019-11-26 ENCOUNTER — Ambulatory Visit (INDEPENDENT_AMBULATORY_CARE_PROVIDER_SITE_OTHER): Payer: Medicaid Other | Admitting: Adult Health

## 2019-11-26 ENCOUNTER — Encounter: Payer: Self-pay | Admitting: Adult Health

## 2019-11-26 ENCOUNTER — Other Ambulatory Visit: Payer: Self-pay

## 2019-11-26 VITALS — BP 135/70 | HR 69 | Temp 97.5°F | Resp 16 | Ht 65.0 in | Wt 259.0 lb

## 2019-11-26 DIAGNOSIS — K219 Gastro-esophageal reflux disease without esophagitis: Secondary | ICD-10-CM

## 2019-11-26 DIAGNOSIS — M5440 Lumbago with sciatica, unspecified side: Secondary | ICD-10-CM

## 2019-11-26 DIAGNOSIS — I10 Essential (primary) hypertension: Secondary | ICD-10-CM | POA: Diagnosis not present

## 2019-11-26 MED ORDER — FAMOTIDINE 20 MG PO TABS: 20.0000 mg | ORAL_TABLET | Freq: Every day | ORAL | 1 refills | Status: DC | PRN
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
## 2019-12-18 ENCOUNTER — Other Ambulatory Visit: Payer: Self-pay | Admitting: Adult Health

## 2019-12-18 DIAGNOSIS — L732 Hidradenitis suppurativa: Secondary | ICD-10-CM

## 2019-12-19 DIAGNOSIS — M4310 Spondylolisthesis, site unspecified: Secondary | ICD-10-CM | POA: Insufficient documentation

## 2019-12-19 DIAGNOSIS — M4319 Spondylolisthesis, multiple sites in spine: Secondary | ICD-10-CM | POA: Insufficient documentation

## 2019-12-20 ENCOUNTER — Other Ambulatory Visit: Payer: Self-pay | Admitting: Adult Health

## 2019-12-25 ENCOUNTER — Other Ambulatory Visit: Payer: Self-pay | Admitting: Adult Health

## 2019-12-27 ENCOUNTER — Other Ambulatory Visit: Payer: Self-pay | Admitting: Adult Health

## 2019-12-27 DIAGNOSIS — B009 Herpesviral infection, unspecified: Secondary | ICD-10-CM

## 2019-12-30 ENCOUNTER — Telehealth: Payer: Self-pay

## 2019-12-30 NOTE — Telephone Encounter (Signed)
Confirmed office visit on 9/1

## 2020-01-01 ENCOUNTER — Encounter: Payer: Self-pay | Admitting: Hospice and Palliative Medicine

## 2020-01-01 ENCOUNTER — Other Ambulatory Visit: Payer: Self-pay

## 2020-01-01 ENCOUNTER — Ambulatory Visit (INDEPENDENT_AMBULATORY_CARE_PROVIDER_SITE_OTHER): Payer: Medicaid Other | Admitting: Hospice and Palliative Medicine

## 2020-01-01 DIAGNOSIS — E1165 Type 2 diabetes mellitus with hyperglycemia: Secondary | ICD-10-CM | POA: Diagnosis not present

## 2020-01-01 DIAGNOSIS — I10 Essential (primary) hypertension: Secondary | ICD-10-CM | POA: Diagnosis not present

## 2020-01-01 DIAGNOSIS — Z7901 Long term (current) use of anticoagulants: Secondary | ICD-10-CM | POA: Diagnosis not present

## 2020-01-01 DIAGNOSIS — M5440 Lumbago with sciatica, unspecified side: Secondary | ICD-10-CM

## 2020-01-01 LAB — POCT GLYCOSYLATED HEMOGLOBIN (HGB A1C): Hemoglobin A1C: 7.8 % — AB (ref 4.0–5.6)

## 2020-01-01 LAB — POCT INR: INR: 1.3 — AB (ref 2.0–3.0)

## 2020-01-01 MED ORDER — PREDNISONE 10 MG PO TABS: ORAL_TABLET | ORAL | 0 refills | Status: DC

## 2020-01-01 MED ORDER — METHYLPREDNISOLONE ACETATE 80 MG/ML IJ SUSP
80.0000 mg | Freq: Once | INTRAMUSCULAR | Status: AC
  Administered 2020-01-01: 80 mg via INTRAMUSCULAR
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
## 2020-01-15 ENCOUNTER — Ambulatory Visit (INDEPENDENT_AMBULATORY_CARE_PROVIDER_SITE_OTHER): Payer: Medicaid Other

## 2020-01-15 ENCOUNTER — Ambulatory Visit: Payer: Medicare Other | Admitting: Hospice and Palliative Medicine

## 2020-01-15 ENCOUNTER — Other Ambulatory Visit: Payer: Self-pay

## 2020-01-15 DIAGNOSIS — Z7901 Long term (current) use of anticoagulants: Secondary | ICD-10-CM | POA: Diagnosis not present

## 2020-01-15 LAB — POCT INR: INR: 2.9 (ref 2.0–3.0)

## 2020-01-15 NOTE — Progress Notes (Signed)
Pt INR was 2.9 and PT was 35.3 per [facility]. pt takes coumadin 5 mg, 2 tablets on saturday and sunday and 1 tablet rest of the week. Follow up in one month

## 2020-01-25 ENCOUNTER — Other Ambulatory Visit: Payer: Self-pay | Admitting: Adult Health

## 2020-01-25 DIAGNOSIS — E782 Mixed hyperlipidemia: Secondary | ICD-10-CM

## 2020-02-04 ENCOUNTER — Ambulatory Visit (INDEPENDENT_AMBULATORY_CARE_PROVIDER_SITE_OTHER): Payer: Medicaid Other | Admitting: Hospice and Palliative Medicine

## 2020-02-04 ENCOUNTER — Encounter: Payer: Self-pay | Admitting: Hospice and Palliative Medicine

## 2020-02-04 ENCOUNTER — Other Ambulatory Visit: Payer: Self-pay

## 2020-02-04 DIAGNOSIS — E1165 Type 2 diabetes mellitus with hyperglycemia: Secondary | ICD-10-CM | POA: Diagnosis not present

## 2020-02-04 DIAGNOSIS — M5442 Lumbago with sciatica, left side: Secondary | ICD-10-CM | POA: Diagnosis not present

## 2020-02-04 DIAGNOSIS — Z7901 Long term (current) use of anticoagulants: Secondary | ICD-10-CM

## 2020-02-04 DIAGNOSIS — Z6841 Body Mass Index (BMI) 40.0 and over, adult: Secondary | ICD-10-CM | POA: Diagnosis not present

## 2020-02-04 DIAGNOSIS — G8929 Other chronic pain: Secondary | ICD-10-CM

## 2020-02-04 DIAGNOSIS — M5441 Lumbago with sciatica, right side: Secondary | ICD-10-CM

## 2020-02-04 LAB — POCT INR: INR: 2.9 (ref 2.0–3.0)

## 2020-02-04 MED ORDER — GLIMEPIRIDE 2 MG PO TABS: ORAL_TABLET | ORAL | 1 refills | Status: DC

## 2020-02-04 MED ORDER — PHENTERMINE HCL 37.5 MG PO CAPS
37.5000 mg | ORAL_CAPSULE | ORAL | 0 refills | Status: DC
Start: 2020-02-04 — End: 2020-02-07

## 2020-02-04 MED ORDER — PHENTERMINE HCL 37.5 MG PO TABS: 37.5000 mg | ORAL_TABLET | Freq: Every day | ORAL | 0 refills | Status: DC

## 2020-02-04 NOTE — Progress Notes (Signed)
[facility] [place], [place] [address]  Internal MEDICINE  Office Visit Note  Patient Name: Brenda Santana  [id number]  [id number]  Date of Service: 02/07/2020  Chief Complaint  Patient presents with  . Follow-up  . Diabetes  . Medical Management of Chronic Issues    weight management    HPI Patient is here for routine follow-up -Continues to have back pain--scheduled to see ortho on the 11th--she finds it hard to work as she has to stand on her feet most of the day -Has been diagnosed with sleep apnea--unable to tolerate CPAP -She is interested in discussing weight loss options-she wants to get back on phentermine-she is also wanting to see if she is eligible for weight loss surgery -Has also been wanting to discuss her warfarin therapy, was told in the past that she would be able to stop taking this medication, has never had any follow-up conversations   Current Medication: No facility-administered encounter medications on file as of 02/04/2020.   Outpatient Encounter Medications as of 02/04/2020  Medication Sig  . Accu-Chek FastClix Lancets MISC Use as directed once a daily diag E11.9  . acyclovir (ZOVIRAX) 400 MG tablet TAKE 1 TABLET BY MOUTH TWICE A DAY (Patient taking differently: Take 400 mg by mouth 2 (two) times daily. )  . acyclovir ointment (ZOVIRAX) 5 % APPLY TOPICALLY EVERY 3 (THREE) HOURS.  Marland Kitchen albuterol (PROAIR HFA) 108 (90 Base) MCG/ACT inhaler INHALE 2 PUFFS INTO THE LUNGS EVERY 4 (FOUR) HOURS AS NEEDED FOR WHEEZING OR SHORTNESS OF BREATH.  Marland Kitchen allopurinol (ZYLOPRIM) 300 MG tablet TAKE ONE TAB AT NIGHT FOR GOUT (Patient taking differently: Take 300 mg by mouth at bedtime. )  . aspirin EC 81 MG tablet Take 81 mg by mouth daily.  Marland Kitchen atorvastatin (LIPITOR) 10 MG tablet TAKE 1 TABLET BY MOUTH EVERY DAY (Patient taking differently: Take 10 mg by mouth daily. )  . baclofen (LIORESAL) 10 MG tablet Take 1 tablet (10 mg total) by mouth 3 (three) times  daily.  . BD PEN NEEDLE NANO U/F 32G X 4 MM MISC USE DAILY WITH VICTOZA  . benazepril (LOTENSIN) 10 MG tablet Take 1 tablet (10 mg total) by mouth daily.  . Biotin 5 MG CAPS Take 5 mg by mouth daily.   . Budeson-Glycopyrrol-Formoterol (BREZTRI AEROSPHERE) 160-9-4.8 MCG/ACT AERO Inhale 2 puffs into the lungs in the morning and at bedtime.  . cetirizine (ZYRTEC ALLERGY) 10 MG tablet Take 1 tablet (10 mg total) by mouth daily as needed for allergies.  . chlorthalidone (HYGROTON) 25 MG tablet TAKE 1 TABLET BY MOUTH EVERY DAY IN THE MORNING (Patient taking differently: Take 25 mg by mouth daily. )  . clobetasol cream (TEMOVATE) 4.23 % Apply 1 application topically 2 (two) times daily as needed (for eczema on hands).   . diclofenac sodium (VOLTAREN) 1 % GEL APPLY 4 G TOPICALLY FOUR (4) TIMES A DAY.  Marland Kitchen docusate sodium (COLACE) 100 MG capsule Take 100 mg by mouth daily.   Marland Kitchen doxycycline (VIBRA-TABS) 100 MG tablet TAKE 1 TABLET BY MOUTH TWICE A DAY (Patient taking differently: Take 100 mg by mouth 2 (two) times daily. )  . famotidine (PEPCID) 20 MG tablet Take 1 tablet (20 mg total) by mouth daily as needed for heartburn or indigestion.  . Ferrous Sulfate (IRON) 325 (65 Fe) MG TABS Take 325 mg by mouth 3 (three) times daily.   Marland Kitchen gabapentin (NEURONTIN) 800 MG tablet Take 800 mg by mouth 3 (  three) times daily as needed (for pain).   Marland Kitchen glimepiride (AMARYL) 2 MG tablet TAKE 1 TABLET BY MOUTH DAILY BEFORE BREAKFAST. (Patient taking differently: Take 2 mg by mouth daily with breakfast. )  . glucose blood (ACCU-CHEK GUIDE) test strip 1 each by Other route daily. Use as instructed  Once daily diag e11.9  . ipratropium-albuterol (DUONEB) 0.5-2.5 (3) MG/3ML SOLN Take 3 mLs by nebulization every 6 (six) hours as needed (for shortness of breath or wheezing).   Marland Kitchen lidocaine (XYLOCAINE) 5 % ointment APPLY A SMALL AMOUNT TO AFFECTED AREA 2-3 TIMES A DAY  . LINZESS 145 MCG CAPS capsule TAKE 1 CAPSULE BY MOUTH EVERY DAY  BEFORE BREAKFAST (Patient taking differently: Take 145 mcg by mouth daily before breakfast. )  . lisinopril (ZESTRIL) 10 MG tablet TAKE 1 TAB DAILY IN MORNING  . metFORMIN (GLUCOPHAGE) 500 MG tablet Take 1 tablet (500 mg total) by mouth 2 (two) times daily with a meal.  . metoprolol tartrate (LOPRESSOR) 50 MG tablet TAKE 1 TABLET BY MOUTH TWICE A DAY  . montelukast (SINGULAIR) 10 MG tablet TAKE 1 TABLET BY MOUTH DAILY FOR ASTHMA  . nystatin (NYSTATIN) powder Apply 1 application topically in the morning, at noon, in the evening, and at bedtime.  . ondansetron (ZOFRAN ODT) 4 MG disintegrating tablet Allow 1-2 tablets to dissolve in your mouth every 8 hours as needed for nausea/vomiting (Patient not taking: Reported on 02/07/2020)  . oxymetazoline (AFRIN) 0.05 % nasal spray Place 1 spray into both nostrils 2 (two) times daily as needed for congestion.  . predniSONE (DELTASONE) 10 MG tablet Take 1 tablet three times daily with a meal for 2 days followed by 1 tablet two days for 2 days. Then resume 10 mg daily as prescribed. (Patient not taking: Reported on 02/07/2020)  . predniSONE (DELTASONE) 5 MG tablet Take 2 tab po daily (Patient taking differently: Take 10 mg by mouth daily with breakfast. )  . SYMBICORT 160-4.5 MCG/ACT inhaler INHALE 2 PUFF TWICE A DAY  . traMADol (ULTRAM) 50 MG tablet Take 1 tablet (50 mg total) by mouth every 8 (eight) hours as needed for moderate pain.  Marland Kitchen tretinoin (RETIN-A) 0.025 % cream Apply 1 application topically at bedtime as needed (for eczema on face).   Marland Kitchen VICTOZA 18 MG/3ML SOPN INJECT 1.8 MG UNDER THE SKIN ONCE DAILY IN THE MORNING  . warfarin (COUMADIN) 5 MG tablet Take a whole tablet on Monday, Tuesday, Thursday, Saturday and Sunday.  Take a 1/2 tablet on Wednesday and Friday. (Patient taking differently: Take 5-10 mg by mouth daily. MON-FRI-5MG  SAT-SUN-10MG )  . [DISCONTINUED] glimepiride (AMARYL) 2 MG tablet TAKE 1 TABLET BY MOUTH DAILY BEFORE BREAKFAST.  Marland Kitchen INCRUSE  ELLIPTA 62.5 MCG/INH AEPB TAKE ONE PUFF EVERY DAY (Patient not taking: Reported on 02/04/2020)  . phentermine 37.5 MG capsule Take 1 capsule (37.5 mg total) by mouth every morning.  . [DISCONTINUED] atorvastatin (LIPITOR) 10 MG tablet TAKE 1 TABLET BY MOUTH EVERY DAY  . [DISCONTINUED] phentermine (ADIPEX-P) 37.5 MG tablet Take 1 tablet (37.5 mg total) by mouth daily before breakfast. (Patient not taking: Reported on 02/04/2020)  . [DISCONTINUED] phentermine (ADIPEX-P) 37.5 MG tablet Take 1 tablet (37.5 mg total) by mouth daily before breakfast.    Surgical History: Past Surgical History:  Procedure Laterality Date  . APPENDECTOMY    . COLONOSCOPY WITH PROPOFOL N/A 02/02/2018   Procedure: COLONOSCOPY WITH PROPOFOL;  Surgeon: Jonathon Bellows, MD;  Location: [facility] ENDOSCOPY;  Service: Gastroenterology;  Laterality: N/A;  .  LAPAROSCOPIC APPENDECTOMY N/A 02/05/2018   Procedure: APPENDECTOMY LAPAROSCOPIC;  Surgeon: Jules Husbands, MD;  Location: [facility] ORS;  Service: General;  Laterality: N/A;  . right arm surgery    . TUBAL LIGATION      Medical History: Past Medical History:  Diagnosis Date  . Asthma   . Atopic dermatitis   . Collagen vascular disease (HCC)    Rhematoid Arthritis  . Constipation   . COPD (chronic obstructive pulmonary disease) ([place])   . Diabetes mellitus without complication ([place])   . DVT (deep venous thrombosis) ([place])   . GERD (gastroesophageal reflux disease)   . Hyperlipidemia   . Hypertension   . Rheumatoid arthritis ([place])     Family History: Family History  Problem Relation Age of Onset  . Breast cancer Mother   . Hypertension Mother   . Diabetes Mother   . Hypertension Father   . Heart failure Father     Social History   Socioeconomic History  . Marital status: Single    Spouse name: Not on file  . Number of children: Not on file  . Years of education: Not on file  . Highest education level: Not on file  Occupational History  . Not on file  Tobacco Use   . Smoking status: Former Research scientist (life sciences)  . Smokeless tobacco: Never Used  Vaping Use  . Vaping Use: Never used  Substance and Sexual Activity  . Alcohol use: No  . Drug use: No  . Sexual activity: Yes  Other Topics Concern  . Not on file  Social History Narrative  . Not on file   Social Determinants of Health   Financial Resource Strain:   . Difficulty of Paying Living Expenses: Not on file  Food Insecurity:   . Worried About Charity fundraiser in the Last Year: Not on file  . Ran Out of Food in the Last Year: Not on file  Transportation Needs:   . Lack of Transportation (Medical): Not on file  . Lack of Transportation (Non-Medical): Not on file  Physical Activity:   . Days of Exercise per Week: Not on file  . Minutes of Exercise per Session: Not on file  Stress:   . Feeling of Stress : Not on file  Social Connections:   . Frequency of Communication with Friends and Family: Not on file  . Frequency of Social Gatherings with Friends and Family: Not on file  . Attends Religious Services: Not on file  . Active Member of Clubs or Organizations: Not on file  . Attends Archivist Meetings: Not on file  . Marital Status: Not on file  Intimate Partner Violence:   . Fear of Current or Ex-Partner: Not on file  . Emotionally Abused: Not on file  . Physically Abused: Not on file  . Sexually Abused: Not on file    Review of Systems  Constitutional: Negative for chills, diaphoresis and fatigue.  HENT: Negative for ear pain, postnasal drip and sinus pressure.   Eyes: Negative for photophobia, discharge, redness, itching and visual disturbance.  Respiratory: Negative for cough, shortness of breath and wheezing.   Cardiovascular: Negative for chest pain, palpitations and leg swelling.  Gastrointestinal: Negative for abdominal pain, constipation, diarrhea, nausea and vomiting.  Genitourinary: Negative for dysuria and flank pain.  Musculoskeletal: Positive for arthralgias, back  pain and gait problem. Negative for neck pain.  Skin: Negative for color change.  Allergic/Immunologic: Negative for environmental allergies and food allergies.  Neurological: Negative for  dizziness and headaches.  Hematological: Does not bruise/bleed easily.  Psychiatric/Behavioral: Negative for agitation, behavioral problems (depression) and hallucinations.    Vital Signs: BP 134/72   Pulse 84   Temp (!) 97.2 F (36.2 C)   Resp 16   Ht 5\' 6"  (1.676 m)   Wt 271 lb 3.2 oz (123 kg)   SpO2 97%   BMI 43.77 kg/m    Physical Exam Vitals reviewed.  Constitutional:      Appearance: Normal appearance. She is obese.  HENT:     Mouth/Throat:     Mouth: Mucous membranes are moist.     Pharynx: Oropharynx is clear.  Cardiovascular:     Rate and Rhythm: Normal rate and regular rhythm.     Pulses: Normal pulses.     Heart sounds: Normal heart sounds.  Pulmonary:     Effort: Pulmonary effort is normal.     Breath sounds: Normal breath sounds.  Abdominal:     General: Abdomen is flat.     Palpations: Abdomen is soft.  Musculoskeletal:        General: Normal range of motion.     Cervical back: Normal range of motion.  Skin:    General: Skin is warm.  Neurological:     General: No focal deficit present.     Mental Status: She is alert and oriented to person, place, and time. Mental status is at baseline.  Psychiatric:        Mood and Affect: Mood normal.        Behavior: Behavior normal.        Thought Content: Thought content normal.    PDMP reviewed, narcotic score 431, sedative score 170, stimulant score 120, overdose risk score 250   Assessment/Plan: 1. Long term (current) use of anticoagulants INR 2.9 today, continue with current dosing -Will review chart and discuss with Dr. Clayborn Bigness about possible discontinuation of Warfarin - POCT INR  2. Uncontrolled type 2 diabetes mellitus with hyperglycemia ([place]) Requesting refills today Need to optimize therapy at next H4T  check, add Trulicity/Ozempic to also assist with weight loss--also consider Toujeo and discontinue glimepiride  - glimepiride (AMARYL) 2 MG tablet; TAKE 1 TABLET BY MOUTH DAILY BEFORE BREAKFAST. (Patient taking differently: Take 2 mg by mouth daily with breakfast. )  Dispense: 90 tablet; Refill: 1  3. BMI 40.0-44.9, adult ([place]) Discussed weight loss options Has been on and off of phentermine-repeat echo to assess for LVH She is wanting to look into bariatric surgery options--will research potential referral surgeons and discuss at next visit -Discontinued order for phentermine-not appropriate at this time, has not shown response to stimulant therapy in the past-also high risk of overdose on PMDP review - ECHOCARDIOGRAM COMPLETE; Future  4. Chronic bilateral low back pain with bilateral sciatica Followed by pain management, continue with their plan of care, scheduled to see ortho at the end of this month  General Counseling: Arline Asp understanding of the findings of todays visit and agrees with plan of treatment. I have discussed any further diagnostic evaluation that may be needed or ordered today. We also reviewed her medications today. she has been encouraged to call the office with any questions or concerns that should arise related to todays visit.    Orders Placed This Encounter  Procedures  . POCT INR  . ECHOCARDIOGRAM COMPLETE    Meds ordered this encounter  Medications  . glimepiride (AMARYL) 2 MG tablet    Sig: TAKE 1 TABLET BY MOUTH DAILY  BEFORE BREAKFAST.    Dispense:  90 tablet    Refill:  1  . DISCONTD: phentermine (ADIPEX-P) 37.5 MG tablet    Sig: Take 1 tablet (37.5 mg total) by mouth daily before breakfast.    Dispense:  30 tablet    Refill:  0  . phentermine 37.5 MG capsule    Sig: Take 1 capsule (37.5 mg total) by mouth every morning.    Dispense:  30 capsule    Refill:  0    Time spent: 30 Minutes Time spent includes review of chart, medications,  test results and follow-up plan with the patient.  This patient was seen by Theodoro Grist AGNP-C in Collaboration with Dr Lavera Guise as a part of collaborative care agreement     Tanna Furry. Bandy Honaker AGNP-C Internal medicine

## 2020-02-07 ENCOUNTER — Observation Stay: Payer: Medicare Other

## 2020-02-07 ENCOUNTER — Emergency Department: Payer: Medicare Other

## 2020-02-07 ENCOUNTER — Encounter: Payer: Self-pay | Admitting: Hospice and Palliative Medicine

## 2020-02-07 ENCOUNTER — Other Ambulatory Visit: Payer: Self-pay

## 2020-02-07 ENCOUNTER — Encounter: Payer: Self-pay | Admitting: Emergency Medicine

## 2020-02-07 ENCOUNTER — Other Ambulatory Visit: Payer: Medicare Other

## 2020-02-07 ENCOUNTER — Inpatient Hospital Stay
Admission: EM | Admit: 2020-02-07 | Discharge: 2020-02-10 | DRG: 378 | Disposition: A | Discharge status: Home / Self Care | Payer: Medicare Other | Attending: Internal Medicine | Admitting: Internal Medicine

## 2020-02-07 DIAGNOSIS — K254 Chronic or unspecified gastric ulcer with hemorrhage: Secondary | ICD-10-CM | POA: Diagnosis not present

## 2020-02-07 DIAGNOSIS — Z794 Long term (current) use of insulin: Secondary | ICD-10-CM

## 2020-02-07 DIAGNOSIS — E785 Hyperlipidemia, unspecified: Secondary | ICD-10-CM | POA: Diagnosis present

## 2020-02-07 DIAGNOSIS — D500 Iron deficiency anemia secondary to blood loss (chronic): Secondary | ICD-10-CM | POA: Diagnosis not present

## 2020-02-07 DIAGNOSIS — Z7952 Long term (current) use of systemic steroids: Secondary | ICD-10-CM

## 2020-02-07 DIAGNOSIS — M109 Gout, unspecified: Secondary | ICD-10-CM | POA: Diagnosis present

## 2020-02-07 DIAGNOSIS — K922 Gastrointestinal hemorrhage, unspecified: Secondary | ICD-10-CM | POA: Diagnosis not present

## 2020-02-07 DIAGNOSIS — M069 Rheumatoid arthritis, unspecified: Secondary | ICD-10-CM | POA: Diagnosis present

## 2020-02-07 DIAGNOSIS — R112 Nausea with vomiting, unspecified: Secondary | ICD-10-CM | POA: Diagnosis present

## 2020-02-07 DIAGNOSIS — R197 Diarrhea, unspecified: Secondary | ICD-10-CM | POA: Diagnosis present

## 2020-02-07 DIAGNOSIS — J449 Chronic obstructive pulmonary disease, unspecified: Secondary | ICD-10-CM | POA: Diagnosis present

## 2020-02-07 DIAGNOSIS — I10 Essential (primary) hypertension: Secondary | ICD-10-CM | POA: Diagnosis present

## 2020-02-07 DIAGNOSIS — E1169 Type 2 diabetes mellitus with other specified complication: Secondary | ICD-10-CM | POA: Diagnosis present

## 2020-02-07 DIAGNOSIS — E1122 Type 2 diabetes mellitus with diabetic chronic kidney disease: Secondary | ICD-10-CM | POA: Diagnosis present

## 2020-02-07 DIAGNOSIS — I129 Hypertensive chronic kidney disease with stage 1 through stage 4 chronic kidney disease, or unspecified chronic kidney disease: Secondary | ICD-10-CM | POA: Diagnosis present

## 2020-02-07 DIAGNOSIS — E1165 Type 2 diabetes mellitus with hyperglycemia: Secondary | ICD-10-CM

## 2020-02-07 DIAGNOSIS — Z8616 Personal history of COVID-19: Secondary | ICD-10-CM

## 2020-02-07 DIAGNOSIS — Z79899 Other long term (current) drug therapy: Secondary | ICD-10-CM

## 2020-02-07 DIAGNOSIS — Z6841 Body Mass Index (BMI) 40.0 and over, adult: Secondary | ICD-10-CM

## 2020-02-07 DIAGNOSIS — K5730 Diverticulosis of large intestine without perforation or abscess without bleeding: Secondary | ICD-10-CM | POA: Diagnosis present

## 2020-02-07 DIAGNOSIS — Z20822 Contact with and (suspected) exposure to covid-19: Secondary | ICD-10-CM | POA: Diagnosis present

## 2020-02-07 DIAGNOSIS — J441 Chronic obstructive pulmonary disease with (acute) exacerbation: Secondary | ICD-10-CM | POA: Diagnosis present

## 2020-02-07 DIAGNOSIS — Z86718 Personal history of other venous thrombosis and embolism: Secondary | ICD-10-CM

## 2020-02-07 DIAGNOSIS — Z792 Long term (current) use of antibiotics: Secondary | ICD-10-CM

## 2020-02-07 DIAGNOSIS — Z803 Family history of malignant neoplasm of breast: Secondary | ICD-10-CM

## 2020-02-07 DIAGNOSIS — M5416 Radiculopathy, lumbar region: Secondary | ICD-10-CM | POA: Diagnosis present

## 2020-02-07 DIAGNOSIS — Z8249 Family history of ischemic heart disease and other diseases of the circulatory system: Secondary | ICD-10-CM

## 2020-02-07 DIAGNOSIS — Z7982 Long term (current) use of aspirin: Secondary | ICD-10-CM

## 2020-02-07 DIAGNOSIS — D72829 Elevated white blood cell count, unspecified: Secondary | ICD-10-CM | POA: Diagnosis present

## 2020-02-07 DIAGNOSIS — K219 Gastro-esophageal reflux disease without esophagitis: Secondary | ICD-10-CM | POA: Diagnosis not present

## 2020-02-07 DIAGNOSIS — Z87891 Personal history of nicotine dependence: Secondary | ICD-10-CM

## 2020-02-07 DIAGNOSIS — I82409 Acute embolism and thrombosis of unspecified deep veins of unspecified lower extremity: Secondary | ICD-10-CM | POA: Diagnosis present

## 2020-02-07 DIAGNOSIS — N183 Chronic kidney disease, stage 3 unspecified: Secondary | ICD-10-CM | POA: Diagnosis present

## 2020-02-07 DIAGNOSIS — D649 Anemia, unspecified: Secondary | ICD-10-CM

## 2020-02-07 DIAGNOSIS — Z7901 Long term (current) use of anticoagulants: Secondary | ICD-10-CM

## 2020-02-07 DIAGNOSIS — R0602 Shortness of breath: Secondary | ICD-10-CM | POA: Diagnosis not present

## 2020-02-07 DIAGNOSIS — D62 Acute posthemorrhagic anemia: Secondary | ICD-10-CM | POA: Diagnosis present

## 2020-02-07 DIAGNOSIS — E1129 Type 2 diabetes mellitus with other diabetic kidney complication: Secondary | ICD-10-CM | POA: Diagnosis present

## 2020-02-07 DIAGNOSIS — T39315A Adverse effect of propionic acid derivatives, initial encounter: Secondary | ICD-10-CM | POA: Diagnosis present

## 2020-02-07 DIAGNOSIS — G4733 Obstructive sleep apnea (adult) (pediatric): Secondary | ICD-10-CM | POA: Diagnosis present

## 2020-02-07 DIAGNOSIS — Z833 Family history of diabetes mellitus: Secondary | ICD-10-CM

## 2020-02-07 LAB — CBC WITH DIFFERENTIAL/PLATELET
Abs Immature Granulocytes: 0.07 10*3/uL (ref 0.00–0.07)
Basophils Absolute: 0 10*3/uL (ref 0.0–0.1)
Basophils Relative: 0 %
Eosinophils Absolute: 0.1 10*3/uL (ref 0.0–0.5)
Eosinophils Relative: 1 %
HCT: 22.2 % — ABNORMAL LOW (ref 36.0–46.0)
Hemoglobin: 7 g/dL — ABNORMAL LOW (ref 12.0–15.0)
Immature Granulocytes: 1 %
Lymphocytes Relative: 21 %
Lymphs Abs: 3 10*3/uL (ref 0.7–4.0)
MCH: 32.3 pg (ref 26.0–34.0)
MCHC: 31.5 g/dL (ref 30.0–36.0)
MCV: 102.3 fL — ABNORMAL HIGH (ref 80.0–100.0)
Monocytes Absolute: 1.2 10*3/uL — ABNORMAL HIGH (ref 0.1–1.0)
Monocytes Relative: 8 %
Neutro Abs: 10.3 10*3/uL — ABNORMAL HIGH (ref 1.7–7.7)
Neutrophils Relative %: 69 %
Platelets: 230 10*3/uL (ref 150–400)
RBC: 2.17 MIL/uL — ABNORMAL LOW (ref 3.87–5.11)
RDW: 14.1 % (ref 11.5–15.5)
WBC: 14.7 10*3/uL — ABNORMAL HIGH (ref 4.0–10.5)
nRBC: 0 % (ref 0.0–0.2)

## 2020-02-07 LAB — COMPREHENSIVE METABOLIC PANEL
ALT: 19 U/L (ref 0–44)
AST: 21 U/L (ref 15–41)
Albumin: 2.9 g/dL — ABNORMAL LOW (ref 3.5–5.0)
Alkaline Phosphatase: 56 U/L (ref 38–126)
Anion gap: 11 (ref 5–15)
BUN: 78 mg/dL — ABNORMAL HIGH (ref 6–20)
CO2: 21 mmol/L — ABNORMAL LOW (ref 22–32)
Calcium: 8.1 mg/dL — ABNORMAL LOW (ref 8.9–10.3)
Chloride: 99 mmol/L (ref 98–111)
Creatinine, Ser: 1.12 mg/dL — ABNORMAL HIGH (ref 0.44–1.00)
GFR calc non Af Amer: 54 mL/min — ABNORMAL LOW (ref >60)
Glucose, Bld: 373 mg/dL — ABNORMAL HIGH (ref 70–99)
Potassium: 3.9 mmol/L (ref 3.5–5.1)
Sodium: 131 mmol/L — ABNORMAL LOW (ref 135–145)
Total Bilirubin: 0.6 mg/dL (ref 0.3–1.2)
Total Protein: 5.2 g/dL — ABNORMAL LOW (ref 6.5–8.1)

## 2020-02-07 LAB — BETA-HYDROXYBUTYRIC ACID: Beta-Hydroxybutyric Acid: 0.34 mmol/L — ABNORMAL HIGH (ref 0.05–0.27)

## 2020-02-07 LAB — CBC
HCT: 24.2 % — ABNORMAL LOW (ref 36.0–46.0)
HCT: 26.7 % — ABNORMAL LOW (ref 36.0–46.0)
HCT: 27.6 % — ABNORMAL LOW (ref 36.0–46.0)
Hemoglobin: 8 g/dL — ABNORMAL LOW (ref 12.0–15.0)
Hemoglobin: 9.1 g/dL — ABNORMAL LOW (ref 12.0–15.0)
Hemoglobin: 9.4 g/dL — ABNORMAL LOW (ref 12.0–15.0)
MCH: 31.6 pg (ref 26.0–34.0)
MCH: 30.8 pg (ref 26.0–34.0)
MCH: 30.8 pg (ref 26.0–34.0)
MCHC: 33.1 g/dL (ref 30.0–36.0)
MCHC: 34.1 g/dL (ref 30.0–36.0)
MCHC: 34.1 g/dL (ref 30.0–36.0)
MCV: 95.7 fL (ref 80.0–100.0)
MCV: 90.5 fL (ref 80.0–100.0)
MCV: 90.5 fL (ref 80.0–100.0)
Platelets: 205 10*3/uL (ref 150–400)
Platelets: 211 10*3/uL (ref 150–400)
Platelets: 220 10*3/uL (ref 150–400)
RBC: 2.53 MIL/uL — ABNORMAL LOW (ref 3.87–5.11)
RBC: 2.95 MIL/uL — ABNORMAL LOW (ref 3.87–5.11)
RBC: 3.05 MIL/uL — ABNORMAL LOW (ref 3.87–5.11)
RDW: 17.1 % — ABNORMAL HIGH (ref 11.5–15.5)
RDW: 20.3 % — ABNORMAL HIGH (ref 11.5–15.5)
RDW: 21 % — ABNORMAL HIGH (ref 11.5–15.5)
WBC: 15.7 10*3/uL — ABNORMAL HIGH (ref 4.0–10.5)
WBC: 17.3 10*3/uL — ABNORMAL HIGH (ref 4.0–10.5)
WBC: 19.5 10*3/uL — ABNORMAL HIGH (ref 4.0–10.5)
nRBC: 0 % (ref 0.0–0.2)
nRBC: 0.1 % (ref 0.0–0.2)
nRBC: 0 % (ref 0.0–0.2)

## 2020-02-07 LAB — RESPIRATORY PANEL BY RT PCR (FLU A&B, COVID)
Influenza A by PCR: NEGATIVE
Influenza B by PCR: NEGATIVE
SARS Coronavirus 2 by RT PCR: NEGATIVE

## 2020-02-07 LAB — BRAIN NATRIURETIC PEPTIDE: B Natriuretic Peptide: 28.5 pg/mL (ref 0.0–100.0)

## 2020-02-07 LAB — BLOOD GAS, VENOUS
Acid-base deficit: 1.2 mmol/L (ref 0.0–2.0)
Bicarbonate: 23.6 mmol/L (ref 20.0–28.0)
O2 Saturation: 91.1 %
Patient temperature: 37
pCO2, Ven: 39 mmHg — ABNORMAL LOW (ref 44.0–60.0)
pH, Ven: 7.39 (ref 7.250–7.430)
pO2, Ven: 62 mmHg — ABNORMAL HIGH (ref 32.0–45.0)

## 2020-02-07 LAB — URINALYSIS, COMPLETE (UACMP) WITH MICROSCOPIC
Bacteria, UA: NONE SEEN
Bilirubin Urine: NEGATIVE
Glucose, UA: >=500 mg/dL — AB
Hgb urine dipstick: NEGATIVE
Ketones, ur: NEGATIVE mg/dL
Leukocytes,Ua: NEGATIVE
Nitrite: NEGATIVE
Protein, ur: NEGATIVE mg/dL
Specific Gravity, Urine: 1.014 (ref 1.005–1.030)
Squamous Epithelial / HPF: NONE SEEN (ref 0–5)
pH: 5 (ref 5.0–8.0)

## 2020-02-07 LAB — APTT: aPTT: 28 seconds (ref 24–36)

## 2020-02-07 LAB — GLUCOSE, CAPILLARY
Glucose-Capillary: 347 mg/dL — ABNORMAL HIGH (ref 70–99)
Glucose-Capillary: 339 mg/dL — ABNORMAL HIGH (ref 70–99)
Glucose-Capillary: 282 mg/dL — ABNORMAL HIGH (ref 70–99)
Glucose-Capillary: 198 mg/dL — ABNORMAL HIGH (ref 70–99)

## 2020-02-07 LAB — PROTIME-INR
INR: 1.8 — ABNORMAL HIGH (ref 0.8–1.2)
Prothrombin Time: 20 seconds — ABNORMAL HIGH (ref 11.4–15.2)

## 2020-02-07 LAB — ABO/RH: ABO/RH(D): O POS

## 2020-02-07 LAB — LIPASE, BLOOD: Lipase: 40 U/L (ref 11–51)

## 2020-02-07 LAB — PREGNANCY, URINE: Preg Test, Ur: NEGATIVE

## 2020-02-07 MED ORDER — ALBUTEROL SULFATE HFA 108 (90 BASE) MCG/ACT IN AERS
2.0000 | INHALATION_SPRAY | RESPIRATORY_TRACT | Status: DC | PRN
  Filled 2020-02-07 (×2): qty 6.7

## 2020-02-07 MED ORDER — ONDANSETRON HCL 4 MG/2ML IJ SOLN
4.0000 mg | Freq: Three times a day (TID) | INTRAMUSCULAR | Status: DC | PRN
  Administered 2020-02-08 – 2020-02-09 (×2): 4 mg via INTRAVENOUS
  Filled 2020-02-07 (×2): qty 2

## 2020-02-07 MED ORDER — SODIUM CHLORIDE 0.9 % IV SOLN: INTRAVENOUS | Status: DC

## 2020-02-07 MED ORDER — ALLOPURINOL 100 MG PO TABS
300.0000 mg | ORAL_TABLET | Freq: Every day | ORAL | Status: DC
  Administered 2020-02-07 – 2020-02-09 (×3): 300 mg via ORAL
  Filled 2020-02-07 (×3): qty 3

## 2020-02-07 MED ORDER — OXYMETAZOLINE HCL 0.05 % NA SOLN
1.0000 | Freq: Two times a day (BID) | NASAL | Status: DC | PRN
  Filled 2020-02-07: qty 15

## 2020-02-07 MED ORDER — DOXYCYCLINE HYCLATE 100 MG PO TABS
100.0000 mg | ORAL_TABLET | Freq: Two times a day (BID) | ORAL | Status: DC
  Administered 2020-02-07 – 2020-02-10 (×6): 100 mg via ORAL
  Filled 2020-02-07 (×6): qty 1

## 2020-02-07 MED ORDER — METOPROLOL TARTRATE 50 MG PO TABS
50.0000 mg | ORAL_TABLET | Freq: Two times a day (BID) | ORAL | Status: DC
  Administered 2020-02-09 – 2020-02-10 (×2): 50 mg via ORAL
  Filled 2020-02-07 (×5): qty 1

## 2020-02-07 MED ORDER — BIOTIN 5 MG PO CAPS: 5.0000 mg | ORAL_CAPSULE | Freq: Every day | ORAL | Status: DC

## 2020-02-07 MED ORDER — BUDESON-GLYCOPYRROL-FORMOTEROL 160-9-4.8 MCG/ACT IN AERO
2.0000 | INHALATION_SPRAY | Freq: Every day | RESPIRATORY_TRACT | Status: DC
  Administered 2020-02-07 – 2020-02-09 (×3): 2 via RESPIRATORY_TRACT
  Filled 2020-02-07: qty 10.7

## 2020-02-07 MED ORDER — SODIUM CHLORIDE 0.9 % IV SOLN
8.0000 mg/h | INTRAVENOUS | Status: DC
  Administered 2020-02-07 (×2): 8 mg/h via INTRAVENOUS
  Filled 2020-02-07 (×5): qty 80

## 2020-02-07 MED ORDER — PANTOPRAZOLE SODIUM 40 MG IV SOLR: 40.0000 mg | Freq: Two times a day (BID) | INTRAVENOUS | Status: DC

## 2020-02-07 MED ORDER — INSULIN ASPART 100 UNIT/ML ~~LOC~~ SOLN
0.0000 [IU] | Freq: Every day | SUBCUTANEOUS | Status: DC
  Administered 2020-02-08: 3 [IU] via SUBCUTANEOUS
  Administered 2020-02-09: 4 [IU] via SUBCUTANEOUS
  Filled 2020-02-07 (×2): qty 1

## 2020-02-07 MED ORDER — IPRATROPIUM-ALBUTEROL 0.5-2.5 (3) MG/3ML IN SOLN
3.0000 mL | Freq: Four times a day (QID) | RESPIRATORY_TRACT | Status: DC
  Administered 2020-02-07 – 2020-02-09 (×9): 3 mL via RESPIRATORY_TRACT
  Filled 2020-02-07 (×9): qty 3

## 2020-02-07 MED ORDER — ACETAMINOPHEN 325 MG PO TABS: 650.0000 mg | ORAL_TABLET | Freq: Four times a day (QID) | ORAL | Status: DC | PRN

## 2020-02-07 MED ORDER — LORATADINE 10 MG PO TABS
10.0000 mg | ORAL_TABLET | Freq: Every day | ORAL | Status: DC
  Administered 2020-02-07 – 2020-02-10 (×4): 10 mg via ORAL
  Filled 2020-02-07 (×4): qty 1

## 2020-02-07 MED ORDER — ATORVASTATIN CALCIUM 10 MG PO TABS
10.0000 mg | ORAL_TABLET | Freq: Every day | ORAL | Status: DC
  Administered 2020-02-07 – 2020-02-09 (×3): 10 mg via ORAL
  Filled 2020-02-07 (×3): qty 1

## 2020-02-07 MED ORDER — SODIUM CHLORIDE 0.9 % IV BOLUS
1000.0000 mL | Freq: Once | INTRAVENOUS | Status: AC
  Administered 2020-02-07: 1000 mL via INTRAVENOUS

## 2020-02-07 MED ORDER — PHENTERMINE HCL 37.5 MG PO CAPS: 37.5000 mg | ORAL_CAPSULE | ORAL | Status: DC

## 2020-02-07 MED ORDER — MORPHINE SULFATE (PF) 2 MG/ML IV SOLN: 2.0000 mg | INTRAVENOUS | Status: DC | PRN

## 2020-02-07 MED ORDER — INSULIN ASPART 100 UNIT/ML ~~LOC~~ SOLN
0.0000 [IU] | Freq: Three times a day (TID) | SUBCUTANEOUS | Status: DC
  Administered 2020-02-07: 8 [IU] via SUBCUTANEOUS
  Administered 2020-02-07: 11 [IU] via SUBCUTANEOUS
  Administered 2020-02-08: 15 [IU] via SUBCUTANEOUS
  Administered 2020-02-08: 8 [IU] via SUBCUTANEOUS
  Administered 2020-02-09: 3 [IU] via SUBCUTANEOUS
  Administered 2020-02-09: 5 [IU] via SUBCUTANEOUS
  Administered 2020-02-09: 8 [IU] via SUBCUTANEOUS
  Administered 2020-02-10: 2 [IU] via SUBCUTANEOUS
  Administered 2020-02-10: 11 [IU] via SUBCUTANEOUS
  Administered 2020-02-10: 8 [IU] via SUBCUTANEOUS
  Filled 2020-02-07 (×10): qty 1

## 2020-02-07 MED ORDER — PANTOPRAZOLE SODIUM 40 MG IV SOLR: 40.0000 mg | Freq: Once | INTRAVENOUS | Status: DC

## 2020-02-07 MED ORDER — GABAPENTIN 400 MG PO CAPS: 800.0000 mg | ORAL_CAPSULE | Freq: Three times a day (TID) | ORAL | Status: DC | PRN

## 2020-02-07 MED ORDER — BACLOFEN 10 MG PO TABS
10.0000 mg | ORAL_TABLET | Freq: Three times a day (TID) | ORAL | Status: DC | PRN
  Filled 2020-02-07: qty 1

## 2020-02-07 MED ORDER — TRAMADOL HCL 50 MG PO TABS
50.0000 mg | ORAL_TABLET | Freq: Three times a day (TID) | ORAL | Status: DC | PRN
  Administered 2020-02-08 – 2020-02-10 (×4): 50 mg via ORAL
  Filled 2020-02-07 (×4): qty 1

## 2020-02-07 MED ORDER — TRETINOIN 0.025 % EX CREA: 1.0000 "application " | TOPICAL_CREAM | Freq: Every evening | CUTANEOUS | Status: DC | PRN

## 2020-02-07 MED ORDER — SODIUM CHLORIDE 0.9 % IV SOLN
80.0000 mg | Freq: Once | INTRAVENOUS | Status: AC
  Administered 2020-02-07: 11:00:00 80 mg via INTRAVENOUS
  Filled 2020-02-07: qty 80

## 2020-02-07 MED ORDER — INSULIN GLARGINE 100 UNIT/ML ~~LOC~~ SOLN
10.0000 [IU] | Freq: Every day | SUBCUTANEOUS | Status: DC
  Administered 2020-02-07 – 2020-02-10 (×3): 10 [IU] via SUBCUTANEOUS
  Filled 2020-02-07 (×5): qty 0.1

## 2020-02-07 MED ORDER — ACYCLOVIR 200 MG PO CAPS
400.0000 mg | ORAL_CAPSULE | Freq: Two times a day (BID) | ORAL | Status: DC
  Administered 2020-02-07 – 2020-02-10 (×6): 400 mg via ORAL
  Filled 2020-02-07 (×7): qty 2

## 2020-02-07 MED ORDER — SODIUM CHLORIDE 0.9 % IV SOLN
10.0000 mL/h | Freq: Once | INTRAVENOUS | Status: AC
  Administered 2020-02-07: 10 mL/h via INTRAVENOUS

## 2020-02-07 MED ORDER — MONTELUKAST SODIUM 10 MG PO TABS
5.0000 mg | ORAL_TABLET | Freq: Every day | ORAL | Status: DC
  Administered 2020-02-07 – 2020-02-09 (×3): 5 mg via ORAL
  Filled 2020-02-07 (×3): qty 1

## 2020-02-07 MED ORDER — FERROUS SULFATE 325 (65 FE) MG PO TABS
325.0000 mg | ORAL_TABLET | Freq: Three times a day (TID) | ORAL | Status: DC
  Administered 2020-02-08 – 2020-02-10 (×8): 325 mg via ORAL
  Filled 2020-02-07 (×8): qty 1

## 2020-02-07 MED ORDER — IOHEXOL 9 MG/ML PO SOLN
500.0000 mL | Freq: Two times a day (BID) | ORAL | Status: DC | PRN
  Administered 2020-02-07 (×2): 500 mL via ORAL

## 2020-02-07 MED ORDER — PREDNISONE 20 MG PO TABS
20.0000 mg | ORAL_TABLET | Freq: Every day | ORAL | Status: DC
  Administered 2020-02-08 – 2020-02-10 (×3): 20 mg via ORAL
  Filled 2020-02-07 (×3): qty 1

## 2020-02-07 MED ORDER — HYDROCORTISONE NA SUCCINATE PF 100 MG IJ SOLR
50.0000 mg | Freq: Once | INTRAMUSCULAR | Status: AC
  Administered 2020-02-07: 50 mg via INTRAVENOUS
  Filled 2020-02-07: qty 2

## 2020-02-07 NOTE — ED Triage Notes (Signed)
Pt ems from home for N/D and SOB. Symptom started last night. Pt found to have CBG 596 by ems (347 here)

## 2020-02-07 NOTE — ED Notes (Signed)
This RN gave report to [place] on floor 2C

## 2020-02-07 NOTE — ED Provider Notes (Addendum)
[facility] Emergency Department Provider Note  ____________________________________________  Time seen: Approximately 8:23 AM  I have reviewed the triage vital signs and the nursing notes.   HISTORY  Chief Complaint Nausea and Shortness of Breath    HPI Brenda Santana is a 59 y.o. female with a history of IDDM, COPD, DVT, hypertension, rheumatoid arthritis who comes ED complaining of nausea and diarrhea since yesterday evening.  She works as a Photographer, reports that all of the kids been sick with a viral illness recently.  Also complains of some mild shortness of breath.  Symptoms are waxing and waning, no aggravating or alleviating factors.  Associated with generalized abdominal pain which is nonradiating and mild to moderate intensity.  Denies black or bloody stool  She had Covid in November 2020.  She has had the full Covid vaccination series the spring.  Denies chest pain.      Past Medical History:  Diagnosis Date  . Asthma   . Atopic dermatitis   . Collagen vascular disease (HCC)    Rhematoid Arthritis  . Constipation   . COPD (chronic obstructive pulmonary disease) ([place])   . Diabetes mellitus without complication ([place])   . DVT (deep venous thrombosis) ([place])   . GERD (gastroesophageal reflux disease)   . Hyperlipidemia   . Hypertension   . Rheumatoid arthritis [facility])      Patient Active Problem List   Diagnosis Date Noted  . Abscess of axilla, right 03/01/2019  . Pain in right upper arm 03/01/2019  . Type 2 diabetes mellitus with hyperglycemia ([place]) 03/01/2019  . Long term (current) use of systemic steroids 03/01/2019  . Long term (current) use of anticoagulants 01/17/2018  . Appendicitis with perforation   . Uncontrolled type 2 diabetes mellitus with hyperglycemia ([place]) 11/09/2017  . 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 Mucopurulent chronic bronchitis ([place]) 11/09/2017  . Personal history of venous thrombosis and embolism 10/06/2017   . Phlebitis and thrombophlebitis of other sites 06/09/2017  . Obstructive sleep apnea of adult 06/09/2017  . Gout, unspecified 06/09/2017  . Herpesviral vulvovaginitis 06/09/2017  . Type 2 diabetes mellitus with diabetic neuropathy, unspecified ([place]) 06/09/2017  . Essential (primary) hypertension 06/09/2017  . Chronic anticoagulation 06/09/2017  . Pain medication agreement signed 01/05/2017  . Subacromial bursitis of left shoulder joint 10/19/2016  . Chronic shoulder bursitis, right 06/21/2016  . Chronic pain of left knee 07/14/2014  . Myofascial muscle pain 07/14/2014  . Low back pain 03/18/2014  . Chronic obstructive pulmonary disease ([place]) 05/01/2013  . Upper respiratory infection 05/01/2013  . Acne 09/12/2012  . Hand dermatitis 09/12/2012  . DVT (deep venous thrombosis) ([place]) 08/31/2012  . Constipation 08/16/2012  . Esophageal reflux disease 08/16/2012  . Asthma 04/10/2012  . Tobacco use disorder 04/02/2012  . Morbid obesity ([place]) 03/01/2012  . Dermatitis 01/11/2012  . Depressive disorder 06/22/2010  . Diabetes mellitus without complication ([place]) 81/19/1478  . Encounter for current long-term use of anticoagulants 08/17/2009     Past Surgical History:  Procedure Laterality Date  . APPENDECTOMY    . COLONOSCOPY WITH PROPOFOL N/A 02/02/2018   Procedure: COLONOSCOPY WITH PROPOFOL;  Surgeon: Jonathon Bellows, MD;  Location: [facility] ENDOSCOPY;  Service: Gastroenterology;  Laterality: N/A;  . LAPAROSCOPIC APPENDECTOMY N/A 02/05/2018   Procedure: APPENDECTOMY LAPAROSCOPIC;  Surgeon: Jules Husbands, MD;  Location: [facility] ORS;  Service: General;  Laterality: N/A;  . right arm surgery    . TUBAL LIGATION       Prior to Admission  medications   Medication Sig Start Date End Date Taking? Authorizing Provider  Accu-Chek FastClix Lancets MISC Use as directed once a daily diag E11.9 05/14/19   Ronnell Freshwater, NP  acyclovir (ZOVIRAX) 400 MG tablet TAKE 1 TABLET BY MOUTH TWICE A DAY 12/27/19    Scarboro, Audie Clear, NP  acyclovir ointment (ZOVIRAX) 5 % APPLY TOPICALLY EVERY 3 (THREE) HOURS. 12/25/19   Kendell Bane, NP  albuterol (PROAIR HFA) 108 (90 Base) MCG/ACT inhaler INHALE 2 PUFFS INTO THE LUNGS EVERY 4 (FOUR) HOURS AS NEEDED FOR WHEEZING OR SHORTNESS OF BREATH. 02/04/19   Kendell Bane, NP  allopurinol (ZYLOPRIM) 300 MG tablet TAKE ONE TAB AT NIGHT FOR GOUT 07/05/19   Kendell Bane, NP  aspirin EC 81 MG tablet Take 81 mg by mouth daily.    [provider]  atorvastatin (LIPITOR) 10 MG tablet TAKE 1 TABLET BY MOUTH EVERY DAY 01/27/20   Kendell Bane, NP  baclofen (LIORESAL) 10 MG tablet Take 1 tablet (10 mg total) by mouth 3 (three) times daily. 10/30/19 10/29/20  Fisher, Linden Dolin, PA-C  BD PEN NEEDLE NANO U/F 32G X 4 MM MISC USE DAILY WITH VICTOZA 07/22/18   Ronnell Freshwater, NP  benazepril (LOTENSIN) 10 MG tablet Take 1 tablet (10 mg total) by mouth daily. 09/03/18   Kendell Bane, NP  Biotin 5 MG CAPS Take 5 mg by mouth daily.  08/12/14   [provider]  Budeson-Glycopyrrol-Formoterol (BREZTRI AEROSPHERE) 160-9-4.8 MCG/ACT AERO Inhale 2 puffs into the lungs in the morning and at bedtime. 08/09/19   Kendell Bane, NP  cetirizine (ZYRTEC ALLERGY) 10 MG tablet Take 1 tablet (10 mg total) by mouth daily as needed for allergies. 04/16/19   Kendell Bane, NP  chlorthalidone (HYGROTON) 25 MG tablet TAKE 1 TABLET BY MOUTH EVERY DAY IN THE MORNING 06/28/19   Lavera Guise, MD  clobetasol cream (TEMOVATE) 8.92 % Apply 1 application topically 2 (two) times daily as needed (for eczema on hands).  08/22/13   [provider]  diclofenac sodium (VOLTAREN) 1 % GEL APPLY 4 G TOPICALLY FOUR (4) TIMES A DAY. 02/10/18   [provider]  diclofenac Sodium (VOLTAREN) 1 % GEL Apply 2 g topically 4 (four) times daily. 01/22/20   [provider]  docusate sodium (COLACE) 100 MG capsule Take 100 mg by mouth daily.     [provider]  doxycycline  (VIBRA-TABS) 100 MG tablet TAKE 1 TABLET BY MOUTH TWICE A DAY 12/18/19   Kendell Bane, NP  famotidine (PEPCID) 20 MG tablet Take 1 tablet (20 mg total) by mouth daily as needed for heartburn or indigestion. 11/26/19   Kendell Bane, NP  Ferrous Sulfate (IRON) 325 (65 Fe) MG TABS Take 325 mg by mouth 3 (three) times daily.     [provider]  gabapentin (NEURONTIN) 800 MG tablet Take 800 mg by mouth 3 (three) times daily as needed (for pain).     [provider]  glimepiride (AMARYL) 2 MG tablet TAKE 1 TABLET BY MOUTH DAILY BEFORE BREAKFAST. 02/04/20   Luiz Ochoa, NP  glucose blood (ACCU-CHEK GUIDE) test strip 1 each by Other route daily. Use as instructed  Once daily diag e11.9 11/05/19   Kendell Bane, NP  INCRUSE ELLIPTA 62.5 MCG/INH AEPB TAKE ONE PUFF EVERY DAY Patient not taking: Reported on 02/04/2020 06/28/19   Lavera Guise, MD  ipratropium-albuterol (DUONEB) 0.5-2.5 (3) MG/3ML SOLN Take  3 mLs by nebulization every 6 (six) hours as needed (for shortness of breath or wheezing).     [provider]  lidocaine (XYLOCAINE) 5 % ointment APPLY A SMALL AMOUNT TO AFFECTED AREA 2-3 TIMES A DAY 11/28/18   Lavera Guise, MD  LINZESS 145 MCG CAPS capsule TAKE 1 CAPSULE BY MOUTH EVERY DAY BEFORE BREAKFAST 12/20/19   Lavera Guise, MD  lisinopril (ZESTRIL) 10 MG tablet TAKE 1 TAB DAILY IN [facility] 11/21/19   Kendell Bane, NP  metFORMIN (GLUCOPHAGE) 500 MG tablet Take 1 tablet (500 mg total) by mouth 2 (two) times daily with a meal. 08/09/19   Scarboro, Audie Clear, NP  metoprolol tartrate (LOPRESSOR) 50 MG tablet TAKE 1 TABLET BY MOUTH TWICE A DAY 11/01/19   [place], Audie Clear, NP  montelukast (SINGULAIR) 10 MG tablet TAKE 1 TABLET BY MOUTH DAILY FOR ASTHMA 11/01/19   Kendell Bane, NP  nystatin (NYSTATIN) powder Apply 1 application topically in the morning, at noon, in the evening, and at bedtime. 09/10/19   Kendell Bane, NP  ondansetron (ZOFRAN ODT) 4 MG disintegrating tablet  Allow 1-2 tablets to dissolve in your mouth every 8 hours as needed for nausea/vomiting 04/26/19   Hinda Kehr, MD  oxymetazoline (AFRIN) 0.05 % nasal spray Place 1 spray into both nostrils 2 (two) times daily as needed for congestion.    [provider]  phentermine 37.5 MG capsule Take 1 capsule (37.5 mg total) by mouth every morning. 02/04/20   Luiz Ochoa, NP  predniSONE (DELTASONE) 10 MG tablet Take 1 tablet three times daily with a meal for 2 days followed by 1 tablet two days for 2 days. Then resume 10 mg daily as prescribed. 01/01/20   Luiz Ochoa, NP  predniSONE (DELTASONE) 5 MG tablet Take 2 tab po daily 10/08/19   Kendell Bane, NP  SYMBICORT 160-4.5 MCG/ACT inhaler INHALE 2 PUFF TWICE A DAY 05/22/19   Kendell Bane, NP  traMADol (ULTRAM) 50 MG tablet Take 1 tablet (50 mg total) by mouth every 8 (eight) hours as needed for moderate pain. 08/09/18   Lavera Guise, MD  tretinoin (RETIN-A) 0.025 % cream Apply 1 application topically at bedtime as needed (for eczema on face).  09/25/17   [provider]  VICTOZA 18 MG/3ML SOPN INJECT 1.8 MG UNDER THE SKIN ONCE DAILY IN THE MORNING 09/02/19   Kendell Bane, NP  warfarin (COUMADIN) 5 MG tablet Take a whole tablet on Monday, Tuesday, Thursday, Saturday and Sunday.  Take a 1/2 tablet on Wednesday and Friday. 01/31/19   Ronnell Freshwater, NP     Allergies Patient has no known allergies.   Family History  Problem Relation Age of Onset  . Breast cancer Mother   . Hypertension Mother   . Diabetes Mother   . Hypertension Father   . Heart failure Father     Social History Social History   Tobacco Use  . Smoking status: Former Research scientist (life sciences)  . Smokeless tobacco: Never Used  Vaping Use  . Vaping Use: Never used  Substance Use Topics  . Alcohol use: No  . Drug use: No    Review of Systems  Constitutional:   No fever or chills.  ENT:   No sore throat. No rhinorrhea. Cardiovascular:   No chest pain or  syncope. Respiratory:   No dyspnea or cough. Gastrointestinal:   Positive as above for abdominal pain and diarrhea. Musculoskeletal:   Negative for  focal pain or swelling All other systems reviewed and are negative except as documented above in ROS and HPI.  ____________________________________________   PHYSICAL EXAM:  VITAL SIGNS: ED Triage Vitals  Enc Vitals Group     BP      Pulse      Resp      Temp      Temp src      SpO2      Weight      Height      Head Circumference      Peak Flow      Pain Score      Pain Loc      Pain Edu?      Excl. in [place]?     Vital signs reviewed, nursing assessments reviewed.   Constitutional:   Alert and oriented. Non-toxic appearance. Eyes:   Conjunctivae are normal. EOMI. PERRL. ENT      Head:   Normocephalic and atraumatic.      Nose:   Wearing a mask.      Mouth/Throat:   Wearing a mask.      Neck:   No meningismus. Full ROM. Hematological/Lymphatic/Immunilogical:   No cervical lymphadenopathy. Cardiovascular:   Tachycardia heart rate 110. Symmetric bilateral radial and DP pulses.  No murmurs. Cap refill less than 2 seconds. Respiratory:   Normal respiratory effort without tachypnea/retractions. Breath sounds are clear and equal bilaterally. No wheezes/rales/rhonchi. Gastrointestinal:   Soft and nontender. Non distended. There is no CVA tenderness.  No rebound, rigidity, or guarding.  Rectal exam performed with nurse Bill at bedside, melanotic stool, strongly Hemoccult positive.  There are external hemorrhoids which are noninflamed or bleeding.  Musculoskeletal:   Normal range of motion in all extremities. No joint effusions.  No lower extremity tenderness.  No edema. Neurologic:   Normal speech and language.  Motor grossly intact. No acute focal neurologic deficits are appreciated.  Skin:    Skin is warm, dry and intact. No rash noted.  No petechiae, purpura, or bullae.  ____________________________________________    LABS  (pertinent positives/negatives) (all labs ordered are listed, but only abnormal results are displayed) Labs Reviewed  COMPREHENSIVE METABOLIC PANEL - Abnormal; Notable for the following components:      Result Value   Sodium 131 (*)    CO2 21 (*)    Glucose, Bld 373 (*)    BUN 78 (*)    Creatinine, Ser 1.12 (*)    Calcium 8.1 (*)    Total Protein 5.2 (*)    Albumin 2.9 (*)    GFR calc non Af Amer 54 (*)    All other components within normal limits  CBC WITH DIFFERENTIAL/PLATELET - Abnormal; Notable for the following components:   WBC 14.7 (*)    RBC 2.17 (*)    Hemoglobin 7.0 (*)    HCT 22.2 (*)    MCV 102.3 (*)    Neutro Abs 10.3 (*)    Monocytes Absolute 1.2 (*)    All other components within normal limits  BLOOD GAS, VENOUS - Abnormal; Notable for the following components:   pCO2, Ven 39 (*)    pO2, Ven 62.0 (*)    All other components within normal limits  BETA-HYDROXYBUTYRIC ACID - Abnormal; Notable for the following components:   Beta-Hydroxybutyric Acid 0.34 (*)    All other components within normal limits  GLUCOSE, CAPILLARY - Abnormal; Notable for the following components:   Glucose-Capillary 347 (*)    All other components within  normal limits  RESPIRATORY PANEL BY RT PCR (FLU A&B, COVID)  LIPASE, BLOOD  URINALYSIS, COMPLETE (UACMP) WITH MICROSCOPIC  PROTIME-INR  PREPARE RBC (CROSSMATCH)  TYPE AND SCREEN   ____________________________________________   EKG  Interpreted by me Sinus tachycardia rate 106.  Normal axis intervals QRS ST segments and T waves.  ____________________________________________    [id number]  DG Chest Portable 1 View  Result Date: 02/07/2020 CLINICAL DATA:  Shortness of breath. EXAM: PORTABLE CHEST 1 VIEW COMPARISON:  November 14, 2019. FINDINGS: The heart size and mediastinal contours are within normal limits. No pneumothorax or pleural effusion is noted. Both lungs are clear. The visualized skeletal structures are unremarkable.  IMPRESSION: No active disease. Electronically Signed   By: Marijo Conception M.D.   On: 02/07/2020 08:24    ____________________________________________   PROCEDURES .Critical Care Performed by: Carrie Mew, MD Authorized by: Carrie Mew, MD   Critical care provider statement:    Critical care time (minutes):  35   Critical care time was exclusive of:  Separately billable procedures and treating other patients   Critical care was necessary to treat or prevent imminent or life-threatening deterioration of the following conditions:  Circulatory failure and shock   Critical care was time spent personally by me on the following activities:  Development of treatment plan with patient or surrogate, discussions with consultants, evaluation of patient's response to treatment, examination of patient, obtaining history from patient or surrogate, ordering and performing treatments and interventions, ordering and review of laboratory studies, ordering and review of radiographic studies, pulse oximetry, re-evaluation of patient's condition and review of old charts Comments:         Angiocath insertion  Date/Time: 02/07/2020 10:25 AM Performed by: Carrie Mew, MD Authorized by: Carrie Mew, MD  Preparation: Patient was prepped and draped in the usual sterile fashion. Local anesthesia used: no  Anesthesia: Local anesthesia used: no  Sedation: Patient sedated: no  Patient tolerance: patient tolerated the procedure well with no immediate complications Comments: [place] vis, R upper arm, 1 attempt, 20g, EBL 0      ____________________________________________  DIFFERENTIAL DIAGNOSIS   Dehydration, electrolyte abnormality, DKA, Covid, influenza-like illness, GI bleed  CLINICAL IMPRESSION / ASSESSMENT AND PLAN / ED COURSE  Medications ordered in the ED: Medications  0.9 %  sodium chloride infusion (has no administration in time range)  pantoprazole (PROTONIX) injection  40 mg (has no administration in time range)  sodium chloride 0.9 % bolus 1,000 mL (1,000 mLs Intravenous New Bag/Given 02/07/20 6195)    Pertinent labs & imaging results that were available during my care of the patient were reviewed by me and considered in my medical decision making (see chart for details).  Brenda Santana was evaluated in Emergency Department on 02/07/2020 for the symptoms described in the history of present illness. She was evaluated in the context of the global COVID-19 pandemic, which necessitated consideration that the patient might be at risk for infection with the SARS-CoV-2 virus that causes COVID-19. Institutional protocols and algorithms that pertain to the evaluation of patients at risk for COVID-19 are in a state of rapid change based on information released by regulatory bodies including the CDC and federal and state organizations. These policies and algorithms were followed during the patient's care in the ED.    Considering the patient's symptoms, medical history, and physical examination today, I have low suspicion for cholecystitis or biliary pathology, pancreatitis, perforation or bowel obstruction, hernia, intra-abdominal abscess, AAA or dissection, volvulus or intussusception,  mesenteric ischemia, or appendicitis.    Clinical Course as of Feb 07 940  Fri Feb 07, 2020  0805 Patient presents with nausea vomiting diarrhea that started yesterday.  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  Blood sugar by EMS was close to 600.  Patient is tachycardic and appears dehydrated.  Will check labs to evaluate for DKA, give IV fluids for hydration, check Covid test   [PS]  0836 Chest x-ray reviewed, unremarkable   [PS]  0936 BUN disproportionately high, hemoglobin is 7 compared to baseline of 13, and patient has Hemoccult positive melanotic stool on exam.  Consistent with GI bleed.  Tachycardia represents early  hemorrhagic shock.  She consents to blood transfusion.  We will continue IV fluid bolus for now while awaiting blood availability.  Will order Protonix and plan for admission. Labs negative for signs of DKA.   [PS]    Clinical Course User Index [PS] Carrie Mew, MD     ____________________________________________   FINAL CLINICAL IMPRESSION(S) / ED DIAGNOSES    Final diagnoses:  Acute upper GI bleed  Symptomatic anemia  Type 2 diabetes mellitus with hyperglycemia, with long-term current use of insulin ([place])  Morbid obesity [facility])     ED Discharge Orders    None      Portions of this note were generated with dragon dictation software. Dictation errors may occur despite best attempts at proofreading.   Carrie Mew, MD 02/07/20 8315    Carrie Mew, MD 02/07/20 1025

## 2020-02-07 NOTE — H&P (Signed)
History and Physical    Brenda Santana GLO:[id number] DOB: 10/03/1960 DOA: 02/07/2020  Referring MD/NP/PA:   PCP: Lavera Guise, MD   Patient coming from:  The patient is coming from home.  At baseline, pt is independent for most of ADL.        Chief Complaint:  nausea, vomiting, diarrhea, tarry stool and SOB.  HPI: Brenda Santana is a 59 y.o. 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  Pt state she has been having nausea, vomiting, diarrhea in the past several days. She has dark tarry stool bowel movement. She also has generalized abdominal pain.  No fever or chills. She has mild shortness of breath, no CP and coughing.  No symptoms of UTI. She works as a Photographer, reports that all of the kids been sick with a viral illness recently.  Patient has a history of a DVT in right arm 7 years ago, currently patient is taking Coumadin.  Last dose of Coumadin was last night.   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 She had Covid in November 2020.  She has had the full Covid vaccination series.  ED Course: pt was found to have Hgb 13.5 on 04/26/19 -->7.0.  WBC 14.7, INR 1.8, PTT 28, pending COVID-19 PCR, beta hydroxybutyric acid 0.34, blood sugar 373 by BMP, bicarbonate 21, anion gap 11, temperature normal, blood pressure 113/60, heart rate 107, RR 32, oxygen saturation 98%, chest x-ray negative.  Patient is placed on MedSurg bed for observation.  Dr. Haig Prophet of GI is consulted.  Review of Systems:   General: no fevers, chills, no body weight gain, has fatigue HEENT: no blurry vision, hearing changes or sore throat Respiratory: has dyspnea, no coughing, wheezing CV: no chest pain, no palpitations GI: has nausea, vomiting, abdominal pain, diarrhea, dark stool GU: no dysuria, burning on urination, increased  urinary frequency, hematuria  Ext: no leg edema Neuro: no unilateral weakness, numbness, or tingling, no vision change or hearing loss Skin: no rash, no skin tear. MSK: No muscle spasm, no deformity, no limitation of range of movement in spin Heme: No easy bruising.  Travel history: No recent long distant travel.  Allergy: No Known Allergies  Past Medical History:  Diagnosis Date  . Asthma   . Atopic dermatitis   . Collagen vascular disease (HCC)    Rhematoid Arthritis  . Constipation   . COPD (chronic obstructive pulmonary disease) ([employer])   . Diabetes mellitus without complication ([place])   . DVT (deep venous thrombosis) ([place])   . GERD (gastroesophageal reflux disease)   . Hyperlipidemia   . Hypertension   . Rheumatoid arthritis [facility])     Past Surgical History:  Procedure Laterality Date  . APPENDECTOMY    . COLONOSCOPY WITH PROPOFOL N/A 02/02/2018   Procedure: COLONOSCOPY WITH PROPOFOL;  Surgeon: Jonathon Bellows, MD;  Location: [facility] ENDOSCOPY;  Service: Gastroenterology;  Laterality: N/A;  . LAPAROSCOPIC APPENDECTOMY N/A 02/05/2018   Procedure: APPENDECTOMY LAPAROSCOPIC;  Surgeon: Jules Husbands, MD;  Location: [facility] ORS;  Service: General;  Laterality: N/A;  . right arm surgery    . TUBAL LIGATION      Social History:  reports that she has quit smoking. She has never used smokeless tobacco. She reports that she does not drink alcohol and does not use drugs.  Family History:  Family  History  Problem Relation Age of Onset  . Breast cancer Mother   . Hypertension Mother   . Diabetes Mother   . Hypertension Father   . Heart failure Father      Prior to Admission medications   Medication Sig Start Date End Date Taking? Authorizing Provider  Accu-Chek FastClix Lancets MISC Use as directed once a daily diag E11.9 05/14/19   Ronnell Freshwater, NP  acyclovir (ZOVIRAX) 400 MG tablet TAKE 1 TABLET BY MOUTH TWICE A DAY 12/27/19   Scarboro, Audie Clear, NP  acyclovir ointment (ZOVIRAX) 5 %  APPLY TOPICALLY EVERY 3 (THREE) HOURS. 12/25/19   Kendell Bane, NP  albuterol (PROAIR HFA) 108 (90 Base) MCG/ACT inhaler INHALE 2 PUFFS INTO THE LUNGS EVERY 4 (FOUR) HOURS AS NEEDED FOR WHEEZING OR SHORTNESS OF BREATH. 02/04/19   Kendell Bane, NP  allopurinol (ZYLOPRIM) 300 MG tablet TAKE ONE TAB AT NIGHT FOR GOUT 07/05/19   Kendell Bane, NP  aspirin EC 81 MG tablet Take 81 mg by mouth daily.    [provider]  atorvastatin (LIPITOR) 10 MG tablet TAKE 1 TABLET BY MOUTH EVERY DAY 01/27/20   Kendell Bane, NP  baclofen (LIORESAL) 10 MG tablet Take 1 tablet (10 mg total) by mouth 3 (three) times daily. 10/30/19 10/29/20  Fisher, Linden Dolin, PA-C  BD PEN NEEDLE NANO U/F 32G X 4 MM MISC USE DAILY WITH VICTOZA 07/22/18   Ronnell Freshwater, NP  benazepril (LOTENSIN) 10 MG tablet Take 1 tablet (10 mg total) by mouth daily. 09/03/18   Kendell Bane, NP  Biotin 5 MG CAPS Take 5 mg by mouth daily.  08/12/14   [provider]  Budeson-Glycopyrrol-Formoterol (BREZTRI AEROSPHERE) 160-9-4.8 MCG/ACT AERO Inhale 2 puffs into the lungs in the morning and at bedtime. 08/09/19   Kendell Bane, NP  cetirizine (ZYRTEC ALLERGY) 10 MG tablet Take 1 tablet (10 mg total) by mouth daily as needed for allergies. 04/16/19   Kendell Bane, NP  chlorthalidone (HYGROTON) 25 MG tablet TAKE 1 TABLET BY MOUTH EVERY DAY IN THE MORNING 06/28/19   Lavera Guise, MD  clobetasol cream (TEMOVATE) 2.53 % Apply 1 application topically 2 (two) times daily as needed (for eczema on hands).  08/22/13   [provider]  diclofenac sodium (VOLTAREN) 1 % GEL APPLY 4 G TOPICALLY FOUR (4) TIMES A DAY. 02/10/18   [provider]  diclofenac Sodium (VOLTAREN) 1 % GEL Apply 2 g topically 4 (four) times daily. 01/22/20   [provider]  docusate sodium (COLACE) 100 MG capsule Take 100 mg by mouth daily.     [provider]  doxycycline (VIBRA-TABS) 100 MG tablet TAKE 1 TABLET BY MOUTH TWICE A  DAY 12/18/19   Kendell Bane, NP  famotidine (PEPCID) 20 MG tablet Take 1 tablet (20 mg total) by mouth daily as needed for heartburn or indigestion. 11/26/19   Kendell Bane, NP  Ferrous Sulfate (IRON) 325 (65 Fe) MG TABS Take 325 mg by mouth 3 (three) times daily.     [provider]  gabapentin (NEURONTIN) 800 MG tablet Take 800 mg by mouth 3 (three) times daily as needed (for pain).     [provider]  glimepiride (AMARYL) 2 MG tablet TAKE 1 TABLET BY MOUTH DAILY BEFORE BREAKFAST. 02/04/20   Luiz Ochoa, NP  glucose blood (ACCU-CHEK GUIDE) test strip 1 each by Other route daily. Use as instructed  Once daily  diag e11.9 11/05/19   Kendell Bane, NP  INCRUSE ELLIPTA 62.5 MCG/INH AEPB TAKE ONE PUFF EVERY DAY Patient not taking: Reported on 02/04/2020 06/28/19   Lavera Guise, MD  ipratropium-albuterol (DUONEB) 0.5-2.5 (3) MG/3ML SOLN Take 3 mLs by nebulization every 6 (six) hours as needed (for shortness of breath or wheezing).     [provider]  lidocaine (XYLOCAINE) 5 % ointment APPLY A SMALL AMOUNT TO AFFECTED AREA 2-3 TIMES A DAY 11/28/18   Lavera Guise, MD  LINZESS 145 MCG CAPS capsule TAKE 1 CAPSULE BY MOUTH EVERY DAY BEFORE BREAKFAST 12/20/19   Lavera Guise, MD  lisinopril (ZESTRIL) 10 MG tablet TAKE 1 TAB DAILY IN [facility] 11/21/19   Kendell Bane, NP  metFORMIN (GLUCOPHAGE) 500 MG tablet Take 1 tablet (500 mg total) by mouth 2 (two) times daily with a meal. 08/09/19   Scarboro, Audie Clear, NP  metoprolol tartrate (LOPRESSOR) 50 MG tablet TAKE 1 TABLET BY MOUTH TWICE A DAY 11/01/19   Scarboro, Audie Clear, NP  montelukast (SINGULAIR) 10 MG tablet TAKE 1 TABLET BY MOUTH DAILY FOR ASTHMA 11/01/19   Kendell Bane, NP  nystatin (NYSTATIN) powder Apply 1 application topically in the morning, at noon, in the evening, and at bedtime. 09/10/19   Kendell Bane, NP  ondansetron (ZOFRAN ODT) 4 MG disintegrating tablet Allow 1-2 tablets to dissolve in your mouth every 8 hours  as needed for nausea/vomiting 04/26/19   Hinda Kehr, MD  oxymetazoline (AFRIN) 0.05 % nasal spray Place 1 spray into both nostrils 2 (two) times daily as needed for congestion.    [provider]  phentermine 37.5 MG capsule Take 1 capsule (37.5 mg total) by mouth every morning. 02/04/20   Luiz Ochoa, NP  predniSONE (DELTASONE) 10 MG tablet Take 1 tablet three times daily with a meal for 2 days followed by 1 tablet two days for 2 days. Then resume 10 mg daily as prescribed. 01/01/20   Luiz Ochoa, NP  predniSONE (DELTASONE) 5 MG tablet Take 2 tab po daily 10/08/19   Kendell Bane, NP  SYMBICORT 160-4.5 MCG/ACT inhaler INHALE 2 PUFF TWICE A DAY 05/22/19   Kendell Bane, NP  traMADol (ULTRAM) 50 MG tablet Take 1 tablet (50 mg total) by mouth every 8 (eight) hours as needed for moderate pain. 08/09/18   Lavera Guise, MD  tretinoin (RETIN-A) 0.025 % cream Apply 1 application topically at bedtime as needed (for eczema on face).  09/25/17   [provider]  VICTOZA 18 MG/3ML SOPN INJECT 1.8 MG UNDER THE SKIN ONCE DAILY IN THE MORNING 09/02/19   Kendell Bane, NP  warfarin (COUMADIN) 5 MG tablet Take a whole tablet on Monday, Tuesday, Thursday, Saturday and Sunday.  Take a 1/2 tablet on Wednesday and Friday. 01/31/19   Ronnell Freshwater, NP    Physical Exam: Vitals:   02/07/20 0853 02/07/20 0855  BP: 113/60   Pulse: (!) 107   Resp: (!) 32   Temp: 97.7 F (36.5 C)   TempSrc: Oral   SpO2: 98%   Weight:  121 kg  Height:  5\' 6"  (1.676 m)   General: Not in acute distress. Pale looking HEENT:       Eyes: PERRL, EOMI, no scleral icterus.       ENT: No discharge from the ears and nose, no pharynx injection, no tonsillar enlargement.        Neck: No JVD, no bruit,  no mass felt. Heme: No neck lymph node enlargement. Cardiac: S1/S2, RRR, No murmurs, No gallops or rubs. Respiratory: no rales, wheezing, rhonchi or rubs. GI: Soft, nondistended, has mild diffused tenderness,  no rebound pain, no organomegaly, BS present. GU: No hematuria Ext: No pitting leg edema bilaterally. 1+DP/PT pulse bilaterally. Musculoskeletal: No joint deformities, No joint redness or warmth, no limitation of ROM in spin. Skin: No rashes.  Neuro: Alert, oriented X3, cranial nerves II-XII grossly intact, moves all extremities normally Psych: Patient is not psychotic, no suicidal or hemocidal ideation.  Labs on Admission: I have personally reviewed following labs and imaging studies  CBC: Recent Labs  Lab 02/07/20 0822  WBC 14.7*  NEUTROABS 10.3*  HGB 7.0*  HCT 22.2*  MCV 102.3*  PLT 109   Basic Metabolic Panel: Recent Labs  Lab 02/07/20 0822  NA 131*  K 3.9  CL 99  CO2 21*  GLUCOSE 373*  BUN 78*  CREATININE 1.12*  CALCIUM 8.1*   GFR: Estimated Creatinine Clearance: 71.7 mL/min (A) (by C-G formula based on SCr of 1.12 mg/dL (H)). Liver Function Tests: Recent Labs  Lab 02/07/20 0822  AST 21  ALT 19  ALKPHOS 56  BILITOT 0.6  PROT 5.2*  ALBUMIN 2.9*   Recent Labs  Lab 02/07/20 0822  LIPASE 40   No results for input(s): AMMONIA in the last 168 hours. Coagulation Profile: Recent Labs  Lab 02/04/20 0927 02/07/20 0948  INR 2.9 1.8*   Cardiac Enzymes: No results for input(s): CKTOTAL, CKMB, CKMBINDEX, TROPONINI in the last 168 hours. BNP (last 3 results) No results for input(s): PROBNP in the last 8760 hours. HbA1C: No results for input(s): HGBA1C in the last 72 hours. CBG: Recent Labs  Lab 02/07/20 0807  GLUCAP 347*   Lipid Profile: No results for input(s): CHOL, HDL, LDLCALC, TRIG, CHOLHDL, LDLDIRECT in the last 72 hours. Thyroid Function Tests: No results for input(s): TSH, T4TOTAL, FREET4, T3FREE, THYROIDAB in the last 72 hours. Anemia Panel: No results for input(s): VITAMINB12, FOLATE, FERRITIN, TIBC, IRON, RETICCTPCT in the last 72 hours. Urine analysis:    Component Value Date/Time   COLORURINE STRAW (A) 02/07/2020 1000   APPEARANCEUR  CLEAR (A) 02/07/2020 1000   APPEARANCEUR Cloudy (A) 04/16/2019 0000   LABSPEC 1.014 02/07/2020 1000   LABSPEC 1.018 10/04/2011 1425   PHURINE 5.0 02/07/2020 1000   GLUCOSEU >=500 (A) 02/07/2020 1000   GLUCOSEU Negative 10/04/2011 1425   HGBUR NEGATIVE 02/07/2020 1000   BILIRUBINUR NEGATIVE 02/07/2020 1000   BILIRUBINUR Negative 04/16/2019 0000   BILIRUBINUR Negative 10/04/2011 1425   KETONESUR NEGATIVE 02/07/2020 1000   PROTEINUR NEGATIVE 02/07/2020 1000   UROBILINOGEN 0.2 11/28/2017 1128   NITRITE NEGATIVE 02/07/2020 1000   LEUKOCYTESUR NEGATIVE 02/07/2020 1000   LEUKOCYTESUR Negative 10/04/2011 1425   Sepsis Labs: @LABRCNTIP (procalcitonin:4,lacticidven:4) ) Recent Results (from the past 240 hour(s))  Respiratory Panel by RT PCR (Flu A&B, Covid) -     Status: None   Collection Time: 02/07/20  8:23 AM   Specimen: Nasopharyngeal  Result Value Ref Range Status   SARS Coronavirus 2 by RT PCR NEGATIVE NEGATIVE Final    Comment: (NOTE) SARS-CoV-2 target nucleic acids are NOT DETECTED.  The SARS-CoV-2 RNA is generally detectable in upper respiratoy specimens during the acute phase of infection. The lowest concentration of SARS-CoV-2 viral copies this assay can detect is 131 copies/mL. A negative result does not preclude SARS-Cov-2 infection and should not be used as the sole basis for treatment or other patient  management decisions. A negative result may occur with  improper specimen collection/handling, submission of specimen other than nasopharyngeal swab, presence of viral mutation(s) within the areas targeted by this assay, and inadequate number of viral copies (<131 copies/mL). A negative result must be combined with clinical observations, patient history, and epidemiological information. The expected result is Negative.  Fact Sheet for Patients:  PinkCheek.be  Fact Sheet for Healthcare Providers:   GravelBags.it  This test is no t yet approved or cleared by the [place] FDA and  has been authorized for detection and/or diagnosis of SARS-CoV-2 by FDA under an Emergency Use Authorization (EUA). This EUA will remain  in effect (meaning this test can be used) for the duration of the COVID-19 declaration under Section 564(b)(1) of the Act, 21 U.S.C. section 360bbb-3(b)(1), unless the authorization is terminated or revoked sooner.     Influenza A by PCR NEGATIVE NEGATIVE Final   Influenza B by PCR NEGATIVE NEGATIVE Final    Comment: (NOTE) The Xpert Xpress SARS-CoV-2/FLU/RSV assay is intended as an aid in  the diagnosis of influenza from Nasopharyngeal swab specimens and  should not be used as a sole basis for treatment. Nasal washings and  aspirates are unacceptable for Xpert Xpress SARS-CoV-2/FLU/RSV  testing.  Fact Sheet for Patients: PinkCheek.be  Fact Sheet for Healthcare Providers: GravelBags.it  This test is not yet approved or cleared by the [place] FDA and  has been authorized for detection and/or diagnosis of SARS-CoV-2 by  FDA under an Emergency Use Authorization (EUA). This EUA will remain  in effect (meaning this test can be used) for the duration of the  Covid-19 declaration under Section 564(b)(1) of the Act, 21  U.S.C. section 360bbb-3(b)(1), unless the authorization is  terminated or revoked. Performed at [facility], [address], [place], [place] [address]      Radiological Exams on Admission: DG Chest Portable 1 View  Result Date: 02/07/2020 CLINICAL DATA:  Shortness of breath. EXAM: PORTABLE CHEST 1 VIEW COMPARISON:  November 14, 2019. FINDINGS: The heart size and mediastinal contours are within normal limits. No pneumothorax or pleural effusion is noted. Both lungs are clear. The visualized skeletal structures are unremarkable. IMPRESSION: No active  disease. Electronically Signed   By: Marijo Conception M.D.   On: 02/07/2020 08:24     EKG: Independently reviewed.  Sinus rhythm, QTC 457, low voltage, mild T wave inversion in lead III/aVF and V4-V6.  Assessment/Plan Principal Problem:   GIB (gastrointestinal bleeding) Active Problems:   Gout, unspecified   Essential (primary) hypertension   Chronic obstructive pulmonary disease (HCC)   Type II diabetes mellitus with renal manifestations (HCC)   DVT (deep venous thrombosis) (HCC)   Esophageal reflux disease   HLD (hyperlipidemia)   Rheumatoid arthritis (HCC)   Symptomatic anemia   Nausea vomiting and diarrhea   Leukocytosis   Obesity, Class III, BMI 40-49.9 (morbid obesity) (HCC)   GIB (gastrointestinal bleeding) and anemia due to blood loss: Hgb 13.5 -->7.0.  Patient is asymptomatic with shortness of breath. Dr. Haig Prophet of GI is consulted  - will place in med-surg bed obs -hold ASA and coumadin - transfuse 2 units of blood now - GI consulted by Ed, will follow up recommendations - NPO now - IVF: 2L NS bolus, then at 755 mL/hr - Start IV pantoprazole gtt - Zofran IV for nausea - Avoid NSAIDs and SQ heparin - Maintain IV access (2 large bore IVs if possible). - Monitor closely and follow q6h  cbc, transfuse as necessary, if Hgb<7.0 - LaB: INR, PTT and type screen  Nausea vomiting and diarrhea and generalized abdominal pain: -Check C. difficile PCR and GI pathogen panel -As needed movement and Zofran -IV fluid as above -Follow-up CT abdomen/pelvis without contrast  Gout, unspecified -Allopurinol  Essential (primary) hypertension: Bp is soft -Hold Lotensin, Hygroton and lisinopril -Continue metoprolol  Chronic obstructive pulmonary disease ([place]): Stable -Bronchodilators  Type II diabetes mellitus with renal manifestations ([place]): Recent A1c 7.8, poorly controlled.  Patient is taking Metformin, Amaryl and Victoza at home.  Blood sugar 373 by BMP, anion gap  normal. -Start Lantus 10 unit daily -Sliding scale insulin  DVT (deep venous thrombosis) in right arm: Patient states that she was diagnosed 7 years ago.  Currently taking Coumadin.  Last dose of Coumadin was last night.  INR 1.8. -Hold Coumadin  Esophageal reflux disease -On IV Protonix  HLD (hyperlipidemia) -Lipitor  Rheumatoid arthritis [facility]): Patient is taking prednisone 10 mg daily -50 mg of Solu-Cortef with stress dose -Increase prednisone from 10 to 20 mg daily  Leukocytosis: WBC 14.7.  Possibly due to steroid use.  No source of infection identified.  Patient is taking doxycycline chronically in the past 5 years for recurrent axillary abscess, but currently no abscess on my examination. -f/u blood culture and UA -f/u by CBC  Obesity, Class III, BMI 40-49.9 (morbid obesity)  -continue home phentermine   DVT ppx: SCD Code Status: Full code Family Communication: not done, no family member is at bed side.     Disposition Plan:  Anticipate discharge back to previous environment Consults called:  GI, Dr. Haig Prophet Admission status: Med-surg bed for obs  Status is: Observation  The patient remains OBS appropriate and will d/c before 2 midnights.  Dispo: The patient is from: Home              Anticipated d/c is to: Home              Anticipated d/c date is: 1 day              Patient currently is not medically stable to d/c.          Date of Service 02/07/2020    [facility]   If 7PM-7AM, please contact night-coverage [email or web address] 02/07/2020, 11:19 AM
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
## 2020-02-07 NOTE — ED Notes (Signed)
Pt to CT for abd ct. Called from ct for pt not looking well. On arrival to ct pt sleepy but arousable, dazed look on face. Per ct tech pt was unresponsive with snoring respirations when they called. Pt brought back to room without ct and placed on monitor. See vs. Blood transfusion started.

## 2020-02-07 NOTE — Progress Notes (Signed)
PHARMACIST - PHYSICIAN ORDER COMMUNICATION  CONCERNING: P&T Medication Policy on Herbal Medications  DESCRIPTION:  This patient's order for:  Biotin  has been noted.  This product(s) is classified as an "herbal" or natural product. Due to a lack of definitive safety studies or FDA approval, nonstandard manufacturing practices, plus the potential risk of unknown drug-drug interactions while on inpatient medications, the Pharmacy and Therapeutics Committee does not permit the use of "herbal" or natural products of this type within [facility].   ACTION TAKEN: The pharmacy department is unable to verify this order at this time. Please reevaluate patient's clinical condition at discharge and address if the herbal or natural product(s) should be resumed at that time.  Paulina Fusi, PharmD, BCPS 02/07/2020 9:55 PM

## 2020-02-07 NOTE — ED Notes (Signed)
RN Mimi and RN Silvia cleaned pt up before going to the floor

## 2020-02-07 NOTE — ED Notes (Signed)
Checked in on pt. Another blanket given. Pt was asleep when exiting the room

## 2020-02-07 NOTE — Progress Notes (Signed)
Patient MEWS yellow. No change from previous status of yellow in the ED. Will continue to monitor.

## 2020-02-08 ENCOUNTER — Observation Stay: Payer: Medicare Other | Admitting: Registered Nurse

## 2020-02-08 ENCOUNTER — Encounter
Admission: EM | Disposition: A | Discharge status: Home / Self Care | Payer: Self-pay | Source: Home / Self Care | Attending: Internal Medicine

## 2020-02-08 ENCOUNTER — Encounter: Payer: Self-pay | Admitting: Internal Medicine

## 2020-02-08 DIAGNOSIS — Z7982 Long term (current) use of aspirin: Secondary | ICD-10-CM | POA: Diagnosis not present

## 2020-02-08 DIAGNOSIS — Z8616 Personal history of COVID-19: Secondary | ICD-10-CM | POA: Diagnosis not present

## 2020-02-08 DIAGNOSIS — N183 Chronic kidney disease, stage 3 unspecified: Secondary | ICD-10-CM | POA: Diagnosis present

## 2020-02-08 DIAGNOSIS — R0602 Shortness of breath: Secondary | ICD-10-CM | POA: Diagnosis present

## 2020-02-08 DIAGNOSIS — I82621 Acute embolism and thrombosis of deep veins of right upper extremity: Secondary | ICD-10-CM | POA: Diagnosis not present

## 2020-02-08 DIAGNOSIS — D649 Anemia, unspecified: Secondary | ICD-10-CM | POA: Diagnosis not present

## 2020-02-08 DIAGNOSIS — G4733 Obstructive sleep apnea (adult) (pediatric): Secondary | ICD-10-CM | POA: Diagnosis present

## 2020-02-08 DIAGNOSIS — E1165 Type 2 diabetes mellitus with hyperglycemia: Secondary | ICD-10-CM | POA: Diagnosis present

## 2020-02-08 DIAGNOSIS — K254 Chronic or unspecified gastric ulcer with hemorrhage: Secondary | ICD-10-CM | POA: Diagnosis present

## 2020-02-08 DIAGNOSIS — K219 Gastro-esophageal reflux disease without esophagitis: Secondary | ICD-10-CM | POA: Diagnosis present

## 2020-02-08 DIAGNOSIS — K5730 Diverticulosis of large intestine without perforation or abscess without bleeding: Secondary | ICD-10-CM | POA: Diagnosis present

## 2020-02-08 DIAGNOSIS — I129 Hypertensive chronic kidney disease with stage 1 through stage 4 chronic kidney disease, or unspecified chronic kidney disease: Secondary | ICD-10-CM | POA: Diagnosis present

## 2020-02-08 DIAGNOSIS — E1122 Type 2 diabetes mellitus with diabetic chronic kidney disease: Secondary | ICD-10-CM | POA: Diagnosis present

## 2020-02-08 DIAGNOSIS — Z6841 Body Mass Index (BMI) 40.0 and over, adult: Secondary | ICD-10-CM | POA: Diagnosis not present

## 2020-02-08 DIAGNOSIS — T39315A Adverse effect of propionic acid derivatives, initial encounter: Secondary | ICD-10-CM | POA: Diagnosis present

## 2020-02-08 DIAGNOSIS — M5416 Radiculopathy, lumbar region: Secondary | ICD-10-CM | POA: Diagnosis present

## 2020-02-08 DIAGNOSIS — K922 Gastrointestinal hemorrhage, unspecified: Secondary | ICD-10-CM | POA: Diagnosis not present

## 2020-02-08 DIAGNOSIS — Z86718 Personal history of other venous thrombosis and embolism: Secondary | ICD-10-CM | POA: Diagnosis not present

## 2020-02-08 DIAGNOSIS — D62 Acute posthemorrhagic anemia: Secondary | ICD-10-CM | POA: Diagnosis present

## 2020-02-08 DIAGNOSIS — M109 Gout, unspecified: Secondary | ICD-10-CM | POA: Diagnosis present

## 2020-02-08 DIAGNOSIS — I10 Essential (primary) hypertension: Secondary | ICD-10-CM | POA: Diagnosis not present

## 2020-02-08 DIAGNOSIS — Z20822 Contact with and (suspected) exposure to covid-19: Secondary | ICD-10-CM | POA: Diagnosis present

## 2020-02-08 DIAGNOSIS — Z79899 Other long term (current) drug therapy: Secondary | ICD-10-CM | POA: Diagnosis not present

## 2020-02-08 DIAGNOSIS — Z7952 Long term (current) use of systemic steroids: Secondary | ICD-10-CM | POA: Diagnosis not present

## 2020-02-08 DIAGNOSIS — M069 Rheumatoid arthritis, unspecified: Secondary | ICD-10-CM | POA: Diagnosis present

## 2020-02-08 DIAGNOSIS — D72829 Elevated white blood cell count, unspecified: Secondary | ICD-10-CM | POA: Diagnosis not present

## 2020-02-08 DIAGNOSIS — Z794 Long term (current) use of insulin: Secondary | ICD-10-CM | POA: Diagnosis not present

## 2020-02-08 DIAGNOSIS — J449 Chronic obstructive pulmonary disease, unspecified: Secondary | ICD-10-CM | POA: Diagnosis present

## 2020-02-08 DIAGNOSIS — E785 Hyperlipidemia, unspecified: Secondary | ICD-10-CM | POA: Diagnosis present

## 2020-02-08 DIAGNOSIS — K253 Acute gastric ulcer without hemorrhage or perforation: Secondary | ICD-10-CM | POA: Diagnosis not present

## 2020-02-08 HISTORY — PX: ESOPHAGOGASTRODUODENOSCOPY (EGD) WITH PROPOFOL: SHX5813

## 2020-02-08 LAB — GASTROINTESTINAL PANEL BY PCR, STOOL (REPLACES STOOL CULTURE)

## 2020-02-08 LAB — CBC
HCT: 25.3 % — ABNORMAL LOW (ref 36.0–46.0)
HCT: 23.1 % — ABNORMAL LOW (ref 36.0–46.0)
HCT: 22 % — ABNORMAL LOW (ref 36.0–46.0)
HCT: 22.4 % — ABNORMAL LOW (ref 36.0–46.0)
Hemoglobin: 8.6 g/dL — ABNORMAL LOW (ref 12.0–15.0)
Hemoglobin: 7.6 g/dL — ABNORMAL LOW (ref 12.0–15.0)
Hemoglobin: 7.4 g/dL — ABNORMAL LOW (ref 12.0–15.0)
Hemoglobin: 7.4 g/dL — ABNORMAL LOW (ref 12.0–15.0)
MCH: 30.8 pg (ref 26.0–34.0)
MCH: 30.3 pg (ref 26.0–34.0)
MCH: 30.6 pg (ref 26.0–34.0)
MCH: 30.6 pg (ref 26.0–34.0)
MCHC: 34 g/dL (ref 30.0–36.0)
MCHC: 32.9 g/dL (ref 30.0–36.0)
MCHC: 33.6 g/dL (ref 30.0–36.0)
MCHC: 33 g/dL (ref 30.0–36.0)
MCV: 90.7 fL (ref 80.0–100.0)
MCV: 92 fL (ref 80.0–100.0)
MCV: 90.9 fL (ref 80.0–100.0)
MCV: 92.6 fL (ref 80.0–100.0)
Platelets: 222 10*3/uL (ref 150–400)
Platelets: 193 10*3/uL (ref 150–400)
Platelets: 200 10*3/uL (ref 150–400)
Platelets: 204 10*3/uL (ref 150–400)
RBC: 2.79 MIL/uL — ABNORMAL LOW (ref 3.87–5.11)
RBC: 2.51 MIL/uL — ABNORMAL LOW (ref 3.87–5.11)
RBC: 2.42 MIL/uL — ABNORMAL LOW (ref 3.87–5.11)
RBC: 2.42 MIL/uL — ABNORMAL LOW (ref 3.87–5.11)
RDW: 21.8 % — ABNORMAL HIGH (ref 11.5–15.5)
RDW: 21.8 % — ABNORMAL HIGH (ref 11.5–15.5)
RDW: 21 % — ABNORMAL HIGH (ref 11.5–15.5)
RDW: 21.2 % — ABNORMAL HIGH (ref 11.5–15.5)
WBC: 20.1 10*3/uL — ABNORMAL HIGH (ref 4.0–10.5)
WBC: 17.8 10*3/uL — ABNORMAL HIGH (ref 4.0–10.5)
WBC: 15.6 10*3/uL — ABNORMAL HIGH (ref 4.0–10.5)
WBC: 16.3 10*3/uL — ABNORMAL HIGH (ref 4.0–10.5)
nRBC: 0.1 % (ref 0.0–0.2)
nRBC: 0 % (ref 0.0–0.2)
nRBC: 0.2 % (ref 0.0–0.2)
nRBC: 0.4 % — ABNORMAL HIGH (ref 0.0–0.2)

## 2020-02-08 LAB — BASIC METABOLIC PANEL
Anion gap: 7 (ref 5–15)
BUN: 53 mg/dL — ABNORMAL HIGH (ref 6–20)
CO2: 23 mmol/L (ref 22–32)
Calcium: 8.2 mg/dL — ABNORMAL LOW (ref 8.9–10.3)
Chloride: 107 mmol/L (ref 98–111)
Creatinine, Ser: 1.13 mg/dL — ABNORMAL HIGH (ref 0.44–1.00)
GFR, Estimated: 53 mL/min — ABNORMAL LOW (ref >60)
Glucose, Bld: 146 mg/dL — ABNORMAL HIGH (ref 70–99)
Potassium: 3.7 mmol/L (ref 3.5–5.1)
Sodium: 137 mmol/L (ref 135–145)

## 2020-02-08 LAB — C DIFFICILE QUICK SCREEN W PCR REFLEX
C Diff antigen: NEGATIVE
C Diff interpretation: NOT DETECTED
C Diff toxin: NEGATIVE

## 2020-02-08 LAB — GLUCOSE, CAPILLARY
Glucose-Capillary: 193 mg/dL — ABNORMAL HIGH (ref 70–99)
Glucose-Capillary: 303 mg/dL — ABNORMAL HIGH (ref 70–99)
Glucose-Capillary: 360 mg/dL — ABNORMAL HIGH (ref 70–99)
Glucose-Capillary: 261 mg/dL — ABNORMAL HIGH (ref 70–99)

## 2020-02-08 LAB — PREPARE RBC (CROSSMATCH)

## 2020-02-08 LAB — HIV ANTIBODY (ROUTINE TESTING W REFLEX): HIV Screen 4th Generation wRfx: NONREACTIVE

## 2020-02-08 SURGERY — ESOPHAGOGASTRODUODENOSCOPY (EGD) WITH PROPOFOL
Anesthesia: General

## 2020-02-08 MED ORDER — LIDOCAINE HCL (CARDIAC) PF 100 MG/5ML IV SOSY
PREFILLED_SYRINGE | INTRAVENOUS | Status: DC | PRN
  Administered 2020-02-08: 100 mg via INTRAVENOUS

## 2020-02-08 MED ORDER — PHENYLEPHRINE HCL (PRESSORS) 10 MG/ML IV SOLN
INTRAVENOUS | Status: DC | PRN
  Administered 2020-02-08: 100 ug via INTRAVENOUS

## 2020-02-08 MED ORDER — PANTOPRAZOLE SODIUM 40 MG PO TBEC
40.0000 mg | DELAYED_RELEASE_TABLET | Freq: Two times a day (BID) | ORAL | Status: DC
  Administered 2020-02-08 – 2020-02-10 (×5): 40 mg via ORAL
  Filled 2020-02-08 (×5): qty 1

## 2020-02-08 MED ORDER — GABAPENTIN 400 MG PO CAPS
800.0000 mg | ORAL_CAPSULE | Freq: Three times a day (TID) | ORAL | Status: DC
  Administered 2020-02-08 – 2020-02-09 (×3): 800 mg via ORAL
  Filled 2020-02-08 (×3): qty 2

## 2020-02-08 MED ORDER — SODIUM CHLORIDE 0.9 % IV SOLN: INTRAVENOUS | Status: DC

## 2020-02-08 MED ORDER — PROPOFOL 10 MG/ML IV BOLUS
INTRAVENOUS | Status: DC | PRN
  Administered 2020-02-08: 70 mg via INTRAVENOUS

## 2020-02-08 MED ORDER — PROPOFOL 500 MG/50ML IV EMUL
INTRAVENOUS | Status: DC | PRN
  Administered 2020-02-08: 140 ug/kg/min via INTRAVENOUS

## 2020-02-08 NOTE — Transfer of Care (Signed)
Immediate Anesthesia Transfer of Care Note  Patient: Brenda Santana  Procedure(s) Performed: ESOPHAGOGASTRODUODENOSCOPY (EGD) WITH PROPOFOL (N/A )  Patient Location: PACU  Anesthesia Type:General  Level of Consciousness: sedated  Airway & Oxygen Therapy: Patient Spontanous Breathing  Post-op Assessment: Report given to RN and Post -op Vital signs reviewed and stable  Post vital signs: Reviewed and stable  Last Vitals:  Vitals Value Taken Time  BP 89/52 02/08/20 0835  Temp    Pulse 108 02/08/20 0837  Resp 32 02/08/20 0837  SpO2 100 % 02/08/20 0837  Vitals shown include unvalidated device data.  Last Pain:  Vitals:   02/08/20 0834  TempSrc:   PainSc: 0-No pain         Complications: No complications documented.

## 2020-02-08 NOTE — H&P (Signed)
Jonathon Bellows, MD [address], [place], [place], [place], [address] [address], [place], [place], [place], [address] Phone: [phone]  Fax: [phone]  Primary Care Physician:  Lavera Guise, MD   Pre-Procedure History & Physical: HPI:  Brenda Santana is a 58 y.o. female is here for an endoscopy    Past Medical History:  Diagnosis Date  . Asthma   . Atopic dermatitis   . Collagen vascular disease (HCC)    Rhematoid Arthritis  . Constipation   . COPD (chronic obstructive pulmonary disease) ([place])   . Diabetes mellitus without complication ([place])   . DVT (deep venous thrombosis) ([place])   . GERD (gastroesophageal reflux disease)   . Hyperlipidemia   . Hypertension   . Rheumatoid arthritis [facility])     Past Surgical History:  Procedure Laterality Date  . APPENDECTOMY    . COLONOSCOPY WITH PROPOFOL N/A 02/02/2018   Procedure: COLONOSCOPY WITH PROPOFOL;  Surgeon: Jonathon Bellows, MD;  Location: [facility] ENDOSCOPY;  Service: Gastroenterology;  Laterality: N/A;  . LAPAROSCOPIC APPENDECTOMY N/A 02/05/2018   Procedure: APPENDECTOMY LAPAROSCOPIC;  Surgeon: Jules Husbands, MD;  Location: [facility] ORS;  Service: General;  Laterality: N/A;  . right arm surgery    . TUBAL LIGATION      Prior to Admission medications   Medication Sig Start Date End Date Taking? Authorizing Provider  acyclovir (ZOVIRAX) 400 MG tablet TAKE 1 TABLET BY MOUTH TWICE A DAY Patient taking differently: Take 400 mg by mouth 2 (two) times daily.  12/27/19  Yes Scarboro, Audie Clear, NP  allopurinol (ZYLOPRIM) 300 MG tablet TAKE ONE TAB AT NIGHT FOR GOUT Patient taking differently: Take 300 mg by mouth at bedtime.  07/05/19  Yes Kendell Bane, NP  aspirin EC 81 MG tablet Take 81 mg by mouth daily.   Yes [provider]  atorvastatin (LIPITOR) 10 MG tablet TAKE 1 TABLET BY MOUTH EVERY DAY Patient taking differently: Take 10 mg by mouth daily.  01/27/20  Yes Scarboro, Audie Clear, NP  benazepril (LOTENSIN) 10 MG tablet  Take 1 tablet (10 mg total) by mouth daily. 09/03/18  Yes Scarboro, Audie Clear, NP  Biotin 5 MG CAPS Take 5 mg by mouth daily.  08/12/14  Yes [provider]  cetirizine (ZYRTEC ALLERGY) 10 MG tablet Take 1 tablet (10 mg total) by mouth daily as needed for allergies. 04/16/19  Yes Scarboro, Audie Clear, NP  chlorthalidone (HYGROTON) 25 MG tablet TAKE 1 TABLET BY MOUTH EVERY DAY IN THE MORNING Patient taking differently: Take 25 mg by mouth daily.  06/28/19  Yes Lavera Guise, MD  docusate sodium (COLACE) 100 MG capsule Take 100 mg by mouth daily.    Yes [provider]  doxycycline (VIBRA-TABS) 100 MG tablet TAKE 1 TABLET BY MOUTH TWICE A DAY Patient taking differently: Take 100 mg by mouth 2 (two) times daily.  12/18/19  Yes Scarboro, Audie Clear, NP  Ferrous Sulfate (IRON) 325 (65 Fe) MG TABS Take 325 mg by mouth 3 (three) times daily.    Yes [provider]  gabapentin (NEURONTIN) 800 MG tablet Take 800 mg by mouth 3 (three) times daily as needed (for pain).    Yes [provider]  glimepiride (AMARYL) 2 MG tablet TAKE 1 TABLET BY MOUTH DAILY BEFORE BREAKFAST. Patient taking differently: Take 2 mg by mouth daily with breakfast.  02/04/20  Yes Luiz Ochoa, NP  LINZESS 145 MCG CAPS capsule TAKE 1 CAPSULE BY  [place] BREAKFAST Patient taking differently: Take 145 mcg by mouth daily before breakfast.  12/20/19  Yes Lavera Guise, MD  lisinopril (ZESTRIL) 10 MG tablet TAKE 1 TAB DAILY IN MORNING 11/21/19  Yes Scarboro, Audie Clear, NP  metFORMIN (GLUCOPHAGE) 500 MG tablet Take 1 tablet (500 mg total) by mouth 2 (two) times daily with a meal. 08/09/19  Yes Scarboro, Audie Clear, NP  metoprolol tartrate (LOPRESSOR) 50 MG tablet TAKE 1 TABLET BY MOUTH TWICE A DAY 11/01/19  Yes Scarboro, Audie Clear, NP  montelukast (SINGULAIR) 10 MG tablet TAKE 1 TABLET BY MOUTH DAILY FOR ASTHMA 11/01/19  Yes Kendell Bane, NP  predniSONE (DELTASONE) 5 MG tablet Take 2 tab po daily Patient taking  differently: Take 10 mg by mouth daily with breakfast.  10/08/19  Yes Scarboro, Audie Clear, NP  VICTOZA 18 MG/3ML SOPN INJECT 1.8 MG UNDER THE SKIN ONCE DAILY IN THE MORNING 09/02/19  Yes Kendell Bane, NP  warfarin (COUMADIN) 5 MG tablet Take a whole tablet on Monday, Tuesday, Thursday, Saturday and Sunday.  Take a 1/2 tablet on Wednesday and Friday. Patient taking differently: Take 5-10 mg by mouth daily. MON-FRI-5MG  SAT-SUN-10MG  01/31/19  Yes Ronnell Freshwater, NP  Accu-Chek FastClix Lancets MISC Use as directed once a daily diag E11.9 05/14/19   Ronnell Freshwater, NP  acyclovir ointment (ZOVIRAX) 5 % APPLY TOPICALLY EVERY 3 (THREE) HOURS. 12/25/19   Kendell Bane, NP  albuterol (PROAIR HFA) 108 (90 Base) MCG/ACT inhaler INHALE 2 PUFFS INTO THE LUNGS EVERY 4 (FOUR) HOURS AS NEEDED FOR WHEEZING OR SHORTNESS OF BREATH. 02/04/19   Kendell Bane, NP  baclofen (LIORESAL) 10 MG tablet Take 1 tablet (10 mg total) by mouth 3 (three) times daily. 10/30/19 10/29/20  Fisher, Linden Dolin, PA-C  BD PEN NEEDLE NANO U/F 32G X 4 MM MISC USE DAILY WITH VICTOZA 07/22/18   Ronnell Freshwater, NP  Budeson-Glycopyrrol-Formoterol (BREZTRI AEROSPHERE) 160-9-4.8 MCG/ACT AERO Inhale 2 puffs into the lungs in the morning and at bedtime. 08/09/19   Kendell Bane, NP  clobetasol cream (TEMOVATE) 3.70 % Apply 1 application topically 2 (two) times daily as needed (for eczema on hands).  08/22/13   [provider]  diclofenac sodium (VOLTAREN) 1 % GEL APPLY 4 G TOPICALLY FOUR (4) TIMES A DAY. 02/10/18   [provider]  famotidine (PEPCID) 20 MG tablet Take 1 tablet (20 mg total) by mouth daily as needed for heartburn or indigestion. 11/26/19   Kendell Bane, NP  glucose blood (ACCU-CHEK GUIDE) test strip 1 each by Other route daily. Use as instructed  Once daily diag e11.9 11/05/19   Kendell Bane, NP  INCRUSE ELLIPTA 62.5 MCG/INH AEPB TAKE ONE PUFF EVERY DAY Patient not taking: Reported on 02/04/2020 06/28/19   Lavera Guise, MD  ipratropium-albuterol (DUONEB) 0.5-2.5 (3) MG/3ML SOLN Take 3 mLs by nebulization every 6 (six) hours as needed (for shortness of breath or wheezing).     [provider]  lidocaine (XYLOCAINE) 5 % ointment APPLY A SMALL AMOUNT TO AFFECTED AREA 2-3 TIMES A DAY 11/28/18   Lavera Guise, MD  nystatin (NYSTATIN) powder Apply 1 application topically in the morning, at noon, in the evening, and at bedtime. 09/10/19   Kendell Bane, NP  ondansetron (ZOFRAN ODT) 4 MG disintegrating tablet Allow 1-2 tablets to dissolve in your mouth every 8 hours as needed for nausea/vomiting Patient not taking: Reported on 02/07/2020 04/26/19  Hinda Kehr, MD  oxymetazoline (AFRIN) 0.05 % nasal spray Place 1 spray into both nostrils 2 (two) times daily as needed for congestion.    [provider]  predniSONE (DELTASONE) 10 MG tablet Take 1 tablet three times daily with a meal for 2 days followed by 1 tablet two days for 2 days. Then resume 10 mg daily as prescribed. Patient not taking: Reported on 02/07/2020 01/01/20   Luiz Ochoa, NP  SYMBICORT 160-4.5 MCG/ACT inhaler INHALE 2 PUFF TWICE A DAY 05/22/19   Kendell Bane, NP  traMADol (ULTRAM) 50 MG tablet Take 1 tablet (50 mg total) by mouth every 8 (eight) hours as needed for moderate pain. 08/09/18   Lavera Guise, MD  tretinoin (RETIN-A) 0.025 % cream Apply 1 application topically at bedtime as needed (for eczema on face).  09/25/17   [provider]    Allergies as of 02/07/2020  . (No Known Allergies)    Family History  Problem Relation Age of Onset  . Breast cancer Mother   . Hypertension Mother   . Diabetes Mother   . Hypertension Father   . Heart failure Father     Social History   Socioeconomic History  . Marital status: Single    Spouse name: Not on file  . Number of children: Not on file  . Years of education: Not on file  . Highest education level: Not on file  Occupational History  . Not on file   Tobacco Use  . Smoking status: Former Research scientist (life sciences)  . Smokeless tobacco: Never Used  Vaping Use  . Vaping Use: Never used  Substance and Sexual Activity  . Alcohol use: No  . Drug use: No  . Sexual activity: Yes  Other Topics Concern  . Not on file  Social History Narrative  . Not on file   Social Determinants of Health   Financial Resource Strain:   . Difficulty of Paying Living Expenses: Not on file  Food Insecurity:   . Worried About Charity fundraiser in the Last Year: Not on file  . Ran Out of Food in the Last Year: Not on file  Transportation Needs:   . Lack of Transportation (Medical): Not on file  . Lack of Transportation (Non-Medical): Not on file  Physical Activity:   . Days of Exercise per Week: Not on file  . Minutes of Exercise per Session: Not on file  Stress:   . Feeling of Stress : Not on file  Social Connections:   . Frequency of Communication with Friends and Family: Not on file  . Frequency of Social Gatherings with Friends and Family: Not on file  . Attends Religious Services: Not on file  . Active Member of Clubs or Organizations: Not on file  . Attends Archivist Meetings: Not on file  . Marital Status: Not on file  Intimate Partner Violence:   . Fear of Current or Ex-Partner: Not on file  . Emotionally Abused: Not on file  . Physically Abused: Not on file  . Sexually Abused: Not on file    Review of Systems: See HPI, otherwise negative ROS  Physical Exam: BP (!) 85/46 (BP Location: Left Arm)   Pulse (!) 115   Temp 98.7 F (37.1 C) (Oral)   Resp 20   Ht 5\' 6"  (1.676 m)   Wt 121 kg   SpO2 91%   BMI 43.06 kg/m  General:   Alert,  pleasant and cooperative in NAD  Head:  Normocephalic and atraumatic. Neck:  Supple; no masses or thyromegaly. Lungs:  Clear throughout to auscultation, normal respiratory effort.    Heart:  +S1, +S2, Regular rate and rhythm, No edema. Abdomen:  Soft, nontender and nondistended. Normal bowel sounds,  without guarding, and without rebound.   Neurologic:  Alert and  oriented x4;  grossly normal neurologically.  Impression/Plan: Brenda Santana is here for an endoscopy  to be performed for  evaluation of melena    Risks, benefits, limitations, and alternatives regarding endoscopy have been reviewed with the patient.  Questions have been answered.  All parties agreeable.   Jonathon Bellows, MD  02/08/2020, 8:15 AM

## 2020-02-08 NOTE — Op Note (Signed)
[facility] Gastroenterology Patient Name: Brenda Santana Procedure Date: 02/08/2020 6:52 AM MRN: [id number] Account #: [id number] Date of Birth: 05/12/1960 Admit Type: Inpatient Age: 59 Room: [facility] ENDO ROOM 4 Gender: Female Note Status: Finalized Procedure:             Upper GI endoscopy Indications:           Melena Providers:             Jonathon Bellows MD, MD Referring MD:          Lavera Guise, MD (Referring MD) Medicines:             Monitored Anesthesia Care Complications:         No immediate complications. Procedure:             Pre-Anesthesia Assessment:                        - Prior to the procedure, a History and Physical was                         performed, and patient medications, allergies and                         sensitivities were reviewed. The patient's tolerance                         of previous anesthesia was reviewed.                        - The risks and benefits of the procedure and the                         sedation options and risks were discussed with the                         patient. All questions were answered and informed                         consent was obtained.                        - ASA Grade Assessment: II - A patient with mild                         systemic disease.                        - ASA Grade Assessment:                        - After reviewing the risks and benefits, the patient                         was deemed in satisfactory condition to undergo the                         procedure.                        After obtaining informed consent, the endoscope was  passed under direct vision. Throughout the procedure,                         the patient's blood pressure, pulse, and oxygen                         saturations were monitored continuously. The Endoscope                         was introduced through the mouth, and advanced to the                         third part of  duodenum. The upper GI endoscopy was                         accomplished with ease. The patient tolerated the                         procedure well. Findings:      The esophagus was normal.      The examined duodenum was normal.      One non-bleeding superficial gastric ulcer with a clean ulcer base       (Forrest Class III) was found at the pylorus. The lesion was 8 mm in       largest dimension.      Two non-bleeding superficial gastric ulcers with a clean ulcer base       (Forrest Class III) were found in the gastric antrum. The largest lesion       was 4 mm in largest dimension.      The cardia and gastric fundus were normal on retroflexion. Impression:            - Normal esophagus.                        - Normal examined duodenum.                        - Non-bleeding gastric ulcer with a clean ulcer base                         (Forrest Class III).                        - Non-bleeding gastric ulcers with a clean ulcer base                         (Forrest Class III).                        - No specimens collected. Recommendation:        - Return patient to hospital ward for ongoing care.                        - Stop All NSAID's                        Prilosec 40 mg BID for 8 weeks                        H pylori  stool antigen                        Commence on clears and advance later today if Hb stable                        In terms of coumadin - if can be held for a few days                         then would prefer that , if not start when needed .                         Close minitoring of Hb by pcp as an outpatient Procedure Code(s):     --- Professional ---                        [phone], Esophagogastroduodenoscopy, flexible,                         transoral; diagnostic, including collection of                         specimen(s) by brushing or washing, when performed                         (separate procedure) Diagnosis Code(s):     --- Professional ---                         K25.9, Gastric ulcer, unspecified as acute or chronic,                         without hemorrhage or perforation                        K92.1, Melena (includes Hematochezia) CPT copyright 2019 American Medical Association. All rights reserved. The codes documented in this report are preliminary and upon coder review may  be revised to meet current compliance requirements. Jonathon Bellows, MD Jonathon Bellows MD, MD 02/08/2020 8:35:33 AM This report has been signed electronically. Number of Addenda: 0 Note Initiated On: 02/08/2020 6:52 AM Estimated Blood Loss:  Estimated blood loss: none.      [facility]

## 2020-02-08 NOTE — Anesthesia Preprocedure Evaluation (Addendum)
Anesthesia Evaluation  Patient identified by MRN, date of birth, ID band Patient awake    Reviewed: Allergy & Precautions, H&P , NPO status , Patient's Chart, lab work & pertinent test results  History of Anesthesia Complications Negative for: history of anesthetic complications  Airway Mallampati: III  TM Distance: >3 FB Neck ROM: full    Dental  (+) Missing, Dental Advidsory Given   Pulmonary neg shortness of breath, asthma , sleep apnea , COPD,  COPD inhaler, neg recent URI, former smoker,    breath sounds clear to auscultation       Cardiovascular hypertension, (-) angina+ DVT  (-) Past MI and (-) Cardiac Stents (-) dysrhythmias (-) Valvular Problems/Murmurs Rhythm:regular Rate:Normal  H/o DVT on chronic anticoagulation   Neuro/Psych PSYCHIATRIC DISORDERS Depression negative neurological ROS     GI/Hepatic Neg liver ROS, GERD  ,  Endo/Other  diabetesMorbid obesity  Renal/GU Renal disease  negative genitourinary   Musculoskeletal  (+) Arthritis ,   Abdominal   Peds  Hematology  (+) Blood dyscrasia, anemia ,   Anesthesia Other Findings Past Medical History: No date: Asthma No date: Atopic dermatitis No date: Constipation No date: COPD (chronic obstructive pulmonary disease) (HCC) No date: Diabetes mellitus without complication (HCC) No date: DVT (deep venous thrombosis) (HCC) No date: GERD (gastroesophageal reflux disease) No date: Hyperlipidemia No date: Hypertension No date: Rheumatoid arthritis (HCC)  Past Surgical History: No date: right arm surgery No date: TUBAL LIGATION  BMI    Body Mass Index:  39.11 kg/m      Reproductive/Obstetrics negative OB ROS                             Anesthesia Physical  Anesthesia Plan  ASA: III  Anesthesia Plan: General   Post-op Pain Management:    Induction: Intravenous  PONV Risk Score and Plan: Propofol infusion and  TIVA  Airway Management Planned: Natural Airway and Nasal Cannula  Additional Equipment:   Intra-op Plan:   Post-operative Plan:   Informed Consent: I have reviewed the patients History and Physical, chart, labs and discussed the procedure including the risks, benefits and alternatives for the proposed anesthesia with the patient or authorized representative who has indicated his/her understanding and acceptance.     Dental Advisory Given  Plan Discussed with: Anesthesiologist, CRNA and Surgeon  Anesthesia Plan Comments:         Anesthesia Quick Evaluation  
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
## 2020-02-09 DIAGNOSIS — I82621 Acute embolism and thrombosis of deep veins of right upper extremity: Secondary | ICD-10-CM | POA: Diagnosis not present

## 2020-02-09 DIAGNOSIS — K922 Gastrointestinal hemorrhage, unspecified: Secondary | ICD-10-CM | POA: Diagnosis not present

## 2020-02-09 DIAGNOSIS — I10 Essential (primary) hypertension: Secondary | ICD-10-CM | POA: Diagnosis not present

## 2020-02-09 DIAGNOSIS — D649 Anemia, unspecified: Secondary | ICD-10-CM | POA: Diagnosis not present

## 2020-02-09 LAB — BASIC METABOLIC PANEL
Anion gap: 8 (ref 5–15)
BUN: 25 mg/dL — ABNORMAL HIGH (ref 6–20)
CO2: 25 mmol/L (ref 22–32)
Calcium: 8.3 mg/dL — ABNORMAL LOW (ref 8.9–10.3)
Chloride: 104 mmol/L (ref 98–111)
Creatinine, Ser: 1.06 mg/dL — ABNORMAL HIGH (ref 0.44–1.00)
GFR, Estimated: 57 mL/min — ABNORMAL LOW (ref >60)
Glucose, Bld: 150 mg/dL — ABNORMAL HIGH (ref 70–99)
Potassium: 3.6 mmol/L (ref 3.5–5.1)
Sodium: 137 mmol/L (ref 135–145)

## 2020-02-09 LAB — CBC
HCT: 20.6 % — ABNORMAL LOW (ref 36.0–46.0)
Hemoglobin: 6.8 g/dL — ABNORMAL LOW (ref 12.0–15.0)
MCH: 30.4 pg (ref 26.0–34.0)
MCHC: 33 g/dL (ref 30.0–36.0)
MCV: 92 fL (ref 80.0–100.0)
Platelets: 191 10*3/uL (ref 150–400)
RBC: 2.24 MIL/uL — ABNORMAL LOW (ref 3.87–5.11)
RDW: 20.6 % — ABNORMAL HIGH (ref 11.5–15.5)
WBC: 15.2 10*3/uL — ABNORMAL HIGH (ref 4.0–10.5)
nRBC: 0.5 % — ABNORMAL HIGH (ref 0.0–0.2)

## 2020-02-09 LAB — PREPARE RBC (CROSSMATCH)

## 2020-02-09 LAB — HEMOGLOBIN AND HEMATOCRIT, BLOOD
HCT: 22.3 % — ABNORMAL LOW (ref 36.0–46.0)
HCT: 23.2 % — ABNORMAL LOW (ref 36.0–46.0)
Hemoglobin: 7.2 g/dL — ABNORMAL LOW (ref 12.0–15.0)
Hemoglobin: 7.4 g/dL — ABNORMAL LOW (ref 12.0–15.0)

## 2020-02-09 LAB — GLUCOSE, CAPILLARY
Glucose-Capillary: 163 mg/dL — ABNORMAL HIGH (ref 70–99)
Glucose-Capillary: 288 mg/dL — ABNORMAL HIGH (ref 70–99)
Glucose-Capillary: 297 mg/dL — ABNORMAL HIGH (ref 70–99)
Glucose-Capillary: 328 mg/dL — ABNORMAL HIGH (ref 70–99)

## 2020-02-09 LAB — MAGNESIUM: Magnesium: 2 mg/dL (ref 1.7–2.4)

## 2020-02-09 MED ORDER — GABAPENTIN 400 MG PO CAPS
1200.0000 mg | ORAL_CAPSULE | Freq: Three times a day (TID) | ORAL | Status: DC
  Administered 2020-02-09 – 2020-02-10 (×4): 1200 mg via ORAL
  Filled 2020-02-09 (×4): qty 3

## 2020-02-09 MED ORDER — SODIUM CHLORIDE 0.9% IV SOLUTION: Freq: Once | INTRAVENOUS | Status: DC
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
## 2020-02-10 ENCOUNTER — Encounter: Payer: Self-pay | Admitting: Gastroenterology

## 2020-02-10 DIAGNOSIS — I10 Essential (primary) hypertension: Secondary | ICD-10-CM | POA: Diagnosis not present

## 2020-02-10 DIAGNOSIS — I82621 Acute embolism and thrombosis of deep veins of right upper extremity: Secondary | ICD-10-CM | POA: Diagnosis not present

## 2020-02-10 DIAGNOSIS — D62 Acute posthemorrhagic anemia: Secondary | ICD-10-CM

## 2020-02-10 DIAGNOSIS — K253 Acute gastric ulcer without hemorrhage or perforation: Secondary | ICD-10-CM | POA: Diagnosis not present

## 2020-02-10 DIAGNOSIS — K922 Gastrointestinal hemorrhage, unspecified: Secondary | ICD-10-CM | POA: Diagnosis not present

## 2020-02-10 DIAGNOSIS — D649 Anemia, unspecified: Secondary | ICD-10-CM | POA: Diagnosis not present

## 2020-02-10 LAB — HEMOGLOBIN AND HEMATOCRIT, BLOOD
HCT: 25.2 % — ABNORMAL LOW (ref 36.0–46.0)
HCT: 23.8 % — ABNORMAL LOW (ref 36.0–46.0)
HCT: 26.8 % — ABNORMAL LOW (ref 36.0–46.0)
Hemoglobin: 8.3 g/dL — ABNORMAL LOW (ref 12.0–15.0)
Hemoglobin: 7.8 g/dL — ABNORMAL LOW (ref 12.0–15.0)
Hemoglobin: 8.8 g/dL — ABNORMAL LOW (ref 12.0–15.0)

## 2020-02-10 LAB — BPAM RBC
Blood Product Expiration Date: 202110112359
Blood Product Expiration Date: 202110112359
Blood Product Expiration Date: 202110122359
Blood Product Expiration Date: 202111082359
Blood Product Expiration Date: 202111092359
ISSUE DATE / TIME: 202110081216
ISSUE DATE / TIME: 202110081522
ISSUE DATE / TIME: 202110081636
ISSUE DATE / TIME: 202110101931
Unit Type and Rh: 5100
Unit Type and Rh: 5100
Unit Type and Rh: 9500
Unit Type and Rh: 9500
Unit Type and Rh: 9500

## 2020-02-10 LAB — RETICULOCYTES
Immature Retic Fract: 55.3 % — ABNORMAL HIGH (ref 2.3–15.9)
RBC.: 2.53 MIL/uL — ABNORMAL LOW (ref 3.87–5.11)
Retic Count, Absolute: 111.6 10*3/uL (ref 19.0–186.0)
Retic Ct Pct: 4.4 % — ABNORMAL HIGH (ref 0.4–3.1)

## 2020-02-10 LAB — TYPE AND SCREEN
ABO/RH(D): O POS
Antibody Screen: NEGATIVE
Unit division: 0
Unit division: 0
Unit division: 0
Unit division: 0
Unit division: 0

## 2020-02-10 LAB — IRON AND TIBC
Iron: 44 ug/dL (ref 28–170)
Saturation Ratios: 15 % (ref 10.4–31.8)
TIBC: 304 ug/dL (ref 250–450)
UIBC: 260 ug/dL

## 2020-02-10 LAB — BASIC METABOLIC PANEL
Anion gap: 12 (ref 5–15)
BUN: 15 mg/dL (ref 6–20)
CO2: 23 mmol/L (ref 22–32)
Calcium: 8.6 mg/dL — ABNORMAL LOW (ref 8.9–10.3)
Chloride: 103 mmol/L (ref 98–111)
Creatinine, Ser: 0.97 mg/dL (ref 0.44–1.00)
GFR, Estimated: >60 mL/min (ref >60)
Glucose, Bld: 174 mg/dL — ABNORMAL HIGH (ref 70–99)
Potassium: 3.9 mmol/L (ref 3.5–5.1)
Sodium: 138 mmol/L (ref 135–145)

## 2020-02-10 LAB — GLUCOSE, CAPILLARY
Glucose-Capillary: 145 mg/dL — ABNORMAL HIGH (ref 70–99)
Glucose-Capillary: 290 mg/dL — ABNORMAL HIGH (ref 70–99)
Glucose-Capillary: 315 mg/dL — ABNORMAL HIGH (ref 70–99)

## 2020-02-10 LAB — VITAMIN B12: Vitamin B-12: 555 pg/mL (ref 180–914)

## 2020-02-10 LAB — FOLATE: Folate: 14.3 ng/mL (ref >5.9)

## 2020-02-10 LAB — FERRITIN: Ferritin: 129 ng/mL (ref 11–307)

## 2020-02-10 MED ORDER — LINACLOTIDE 145 MCG PO CAPS
145.0000 ug | ORAL_CAPSULE | Freq: Every day | ORAL | Status: DC
  Filled 2020-02-10 (×2): qty 1

## 2020-02-10 MED ORDER — POLYETHYLENE GLYCOL 3350 17 G PO PACK
17.0000 g | PACK | Freq: Every day | ORAL | Status: DC
  Administered 2020-02-10: 17 g via ORAL
  Filled 2020-02-10: qty 1

## 2020-02-10 MED ORDER — GABAPENTIN 400 MG PO CAPS
1200.0000 mg | ORAL_CAPSULE | Freq: Three times a day (TID) | ORAL | 1 refills | Status: DC
Start: 2020-02-10 — End: 2020-02-25

## 2020-02-10 MED ORDER — IPRATROPIUM-ALBUTEROL 0.5-2.5 (3) MG/3ML IN SOLN
3.0000 mL | Freq: Three times a day (TID) | RESPIRATORY_TRACT | Status: DC
  Administered 2020-02-10 (×2): 3 mL via RESPIRATORY_TRACT
  Filled 2020-02-10 (×2): qty 3

## 2020-02-10 MED ORDER — BISACODYL 5 MG PO TBEC: 5.0000 mg | DELAYED_RELEASE_TABLET | Freq: Every day | ORAL | Status: DC | PRN

## 2020-02-10 MED ORDER — PANTOPRAZOLE SODIUM 40 MG PO TBEC
40.0000 mg | DELAYED_RELEASE_TABLET | Freq: Two times a day (BID) | ORAL | 1 refills | Status: DC
Start: 2020-02-10 — End: 2020-03-10

## 2020-02-10 MED ORDER — POLYETHYLENE GLYCOL 3350 17 G PO PACK: 17.0000 g | PACK | Freq: Every day | ORAL | 0 refills | Status: DC

## 2020-02-10 NOTE — Discharge Summary (Signed)
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 from Nasopharyngeal swab specimens and  should not be used as a sole basis for treatment. Nasal washings and  aspirates are unacceptable for Xpert Xpress SARS-CoV-2/FLU/RSV  testing.  Fact Sheet for Patients: PinkCheek.be  Fact Sheet for Healthcare Providers: GravelBags.it  This test is not yet approved or cleared by the [place] FDA and  has been  authorized for detection and/or diagnosis of SARS-CoV-2 by  FDA under an Emergency Use Authorization (EUA). This EUA will remain  in effect (meaning this test can be used) for the duration of the  Covid-19 declaration under Section 564(b)(1) of the Act, 21  U.S.C. section 360bbb-3(b)(1), unless the authorization is  terminated or revoked. Performed at [facility], [address] [place], [place] [address]   C Difficile Quick Screen w PCR reflex     Status: None   Collection Time: 02/07/20 11:06 AM   Specimen: STOOL  Result Value Ref Range Status   C Diff antigen NEGATIVE NEGATIVE Final   C Diff toxin NEGATIVE NEGATIVE Final   C Diff interpretation No C. difficile detected.  Final    Comment: Performed at [facility], [address] [place], [place] [address]  Gastrointestinal Panel by PCR , Stool     Status: None   Collection Time: 02/07/20 11:07 AM   Specimen: Stool  Result Value Ref Range Status   Campylobacter species NOT DETECTED NOT DETECTED Final   Plesimonas shigelloides NOT DETECTED NOT DETECTED Final   Salmonella species NOT DETECTED NOT DETECTED Final   Yersinia enterocolitica NOT DETECTED NOT DETECTED Final   Vibrio species NOT DETECTED NOT DETECTED Final   Vibrio cholerae NOT DETECTED NOT DETECTED Final   Enteroaggregative E coli (EAEC) NOT DETECTED NOT DETECTED Final   Enteropathogenic E coli (EPEC) NOT DETECTED NOT DETECTED Final   Enterotoxigenic E coli (ETEC) NOT DETECTED NOT DETECTED Final   Shiga like toxin producing E coli (STEC) NOT DETECTED NOT DETECTED Final   Shigella/Enteroinvasive E coli (EIEC) NOT DETECTED NOT DETECTED Final   Cryptosporidium NOT DETECTED NOT DETECTED Final   Cyclospora cayetanensis NOT DETECTED NOT DETECTED Final   Entamoeba histolytica NOT DETECTED NOT DETECTED  Final   Giardia lamblia NOT DETECTED NOT DETECTED Final   Adenovirus F40/41 NOT DETECTED NOT DETECTED Final   Astrovirus NOT DETECTED NOT DETECTED Final    Norovirus GI/GII NOT DETECTED NOT DETECTED Final   Rotavirus A NOT DETECTED NOT DETECTED Final   Sapovirus (I, II, IV, and V) NOT DETECTED NOT DETECTED Final    Comment: Performed at [facility], [address] [place], [place] [address]     Time coordinating discharge: Over 30 minutes  SIGNED:   Ezekiel Slocumb, DO [facility] 02/10/2020, 1:47 PM   If 7PM-7AM, please contact night-coverage [email or web address]

## 2020-02-10 NOTE — Progress Notes (Signed)
Brenda Antigua, MD [address] [place], [place], [place], [address] [address], [place], [place], [place], [address] Phone: [phone]  Fax: [phone]   Subjective: Patient denies any active bleeding since her procedures.  Denies any abdominal pain.  Tolerating oral diet without difficulty   Objective: Exam: Vital signs in last 24 hours: Vitals:   02/10/20 0425 02/10/20 0803 02/10/20 1141 02/10/20 1446  BP: (!) 122/48  117/65   Pulse: 89  86   Resp: 20     Temp: 98.3 F (36.8 C)  98.4 F (36.9 C)   TempSrc: Oral  Oral   SpO2: 96% 99% 100% 95%  Weight:      Height:       Weight change:   Intake/Output Summary (Last 24 hours) at 02/10/2020 1512 Last data filed at 02/10/2020 1413 Gross per 24 hour  Intake 940 ml  Output --  Net 940 ml    General: No acute distress, AAO x3 Abd: Soft, NT/ND, No HSM Skin: Warm, no rashes Neck: Supple, Trachea midline   Lab Results: Lab Results  Component Value Date   WBC 15.2 (H) 02/09/2020   HGB 8.8 (L) 02/10/2020   HCT 26.8 (L) 02/10/2020   MCV 92.0 02/09/2020   PLT 191 02/09/2020   Micro Results: Recent Results (from the past 240 hour(s))  Respiratory Panel by RT PCR (Flu A&B, Covid) -     Status: None   Collection Time: 02/07/20  8:23 AM   Specimen: Nasopharyngeal  Result Value Ref Range Status   SARS Coronavirus 2 by RT PCR NEGATIVE NEGATIVE Final    Comment: (NOTE) SARS-CoV-2 target nucleic acids are NOT DETECTED.  The SARS-CoV-2 RNA is generally detectable in upper respiratoy specimens during the acute phase of infection. The lowest concentration of SARS-CoV-2 viral copies this assay can detect is 131 copies/mL. A negative result does not preclude SARS-Cov-2 infection and should not be used as the sole basis for treatment or other patient management decisions. A negative result may occur with  improper specimen collection/handling, submission of specimen other than nasopharyngeal swab, presence  of viral mutation(s) within the areas targeted by this assay, and inadequate number of viral copies (<131 copies/mL). A negative result must be combined with clinical observations, patient history, and epidemiological information. The expected result is Negative.  Fact Sheet for Patients:  PinkCheek.be  Fact Sheet for Healthcare Providers:  GravelBags.it  This test is no t yet approved or cleared by the [place] FDA and  has been authorized for detection and/or diagnosis of SARS-CoV-2 by FDA under an Emergency Use Authorization (EUA). This EUA will remain  in effect (meaning this test can be used) for the duration of the COVID-19 declaration under Section 564(b)(1) of the Act, 21 U.S.C. section 360bbb-3(b)(1), unless the authorization is terminated or revoked sooner.     Influenza A by PCR NEGATIVE NEGATIVE Final   Influenza B by PCR NEGATIVE NEGATIVE Final    Comment: (NOTE) The Xpert Xpress SARS-CoV-2/FLU/RSV assay is intended as an aid in  the diagnosis of influenza from Nasopharyngeal swab specimens and  should not be used as a sole basis for treatment. Nasal washings and  aspirates are unacceptable for Xpert Xpress SARS-CoV-2/FLU/RSV  testing.  Fact Sheet for Patients: PinkCheek.be  Fact Sheet for Healthcare Providers: GravelBags.it  This test is not yet approved or cleared by the [place] FDA and  has been authorized for detection and/or diagnosis of SARS-CoV-2 by  FDA under an Emergency Use  Authorization (EUA). This EUA will remain  in effect (meaning this test can be used) for the duration of the  Covid-19 declaration under Section 564(b)(1) of the Act, 21  U.S.C. section 360bbb-3(b)(1), unless the authorization is  terminated or revoked. Performed at [facility], [address] [place], [place] [address]   C Difficile Quick  Screen w PCR reflex     Status: None   Collection Time: 02/07/20 11:06 AM   Specimen: STOOL  Result Value Ref Range Status   C Diff antigen NEGATIVE NEGATIVE Final   C Diff toxin NEGATIVE NEGATIVE Final   C Diff interpretation No C. difficile detected.  Final    Comment: Performed at [facility], [address] [place], [place] [address]  Gastrointestinal Panel by PCR , Stool     Status: None   Collection Time: 02/07/20 11:07 AM   Specimen: Stool  Result Value Ref Range Status   Campylobacter species NOT DETECTED NOT DETECTED Final   Plesimonas shigelloides NOT DETECTED NOT DETECTED Final   Salmonella species NOT DETECTED NOT DETECTED Final   Yersinia enterocolitica NOT DETECTED NOT DETECTED Final   Vibrio species NOT DETECTED NOT DETECTED Final   Vibrio cholerae NOT DETECTED NOT DETECTED Final   Enteroaggregative E coli (EAEC) NOT DETECTED NOT DETECTED Final   Enteropathogenic E coli (EPEC) NOT DETECTED NOT DETECTED Final   Enterotoxigenic E coli (ETEC) NOT DETECTED NOT DETECTED Final   Shiga like toxin producing E coli (STEC) NOT DETECTED NOT DETECTED Final   Shigella/Enteroinvasive E coli (EIEC) NOT DETECTED NOT DETECTED Final   Cryptosporidium NOT DETECTED NOT DETECTED Final   Cyclospora cayetanensis NOT DETECTED NOT DETECTED Final   Entamoeba histolytica NOT DETECTED NOT DETECTED Final   Giardia lamblia NOT DETECTED NOT DETECTED Final   Adenovirus F40/41 NOT DETECTED NOT DETECTED Final   Astrovirus NOT DETECTED NOT DETECTED Final   Norovirus GI/GII NOT DETECTED NOT DETECTED Final   Rotavirus A NOT DETECTED NOT DETECTED Final   Sapovirus (I, II, IV, and V) NOT DETECTED NOT DETECTED Final    Comment: Performed at [facility], [address] [place], [place] [address]   Studies/Results: No results found. Medications:  Scheduled Meds: . sodium chloride   Intravenous Once  . acyclovir  400 mg Oral BID  . allopurinol  300 mg Oral Daily  . atorvastatin   10 mg Oral Daily  . Budeson-Glycopyrrol-Formoterol  2 puff Inhalation QHS  . doxycycline  100 mg Oral BID  . ferrous sulfate  325 mg Oral TID WC  . gabapentin  1,200 mg Oral TID  . insulin aspart  0-15 Units Subcutaneous TID WC  . insulin aspart  0-5 Units Subcutaneous QHS  . insulin glargine  10 Units Subcutaneous Daily  . ipratropium-albuterol  3 mL Nebulization TID  . linaclotide  145 mcg Oral QAC breakfast  . loratadine  10 mg Oral Daily  . metoprolol tartrate  50 mg Oral BID  . montelukast  5 mg Oral QHS  . pantoprazole  40 mg Oral BID  . polyethylene glycol  17 g Oral Daily  . predniSONE  20 mg Oral Q breakfast   Continuous Infusions: PRN Meds:.acetaminophen, albuterol, baclofen, bisacodyl, iohexol, ondansetron (ZOFRAN) IV, oxymetazoline, traMADol   Assessment: Principal Problem:   GIB (gastrointestinal bleeding) Active Problems:   Gout, unspecified   Essential (primary) hypertension   Chronic obstructive pulmonary disease (HCC)   Type II diabetes mellitus with renal manifestations (HCC)   DVT (deep  venous thrombosis) (HCC)   Esophageal reflux disease   HLD (hyperlipidemia)   Rheumatoid arthritis (HCC)   Symptomatic anemia   Nausea vomiting and diarrhea   Leukocytosis   Obesity, Class III, BMI 40-49.9 (morbid obesity) (HCC)   Acute upper GI bleeding    Plan: Hemoglobin stable with no signs of active GI bleeding since procedure  Continue PPI twice daily for 8 weeks H. pylori stool antigen pending  Patient advised to avoid NSAIDs, unless prescribed by a physician  Follow-up in GI clinic in 4 to 6 weeks of discharge     LOS: 2 days   Brenda Antigua, MD 02/10/2020, 3:12 PM

## 2020-02-10 NOTE — Progress Notes (Addendum)
Inpatient Diabetes Program Recommendations  AACE/ADA: New Consensus Statement on Inpatient Glycemic Control (2015)  Target Ranges:  Prepandial:   less than 140 mg/dL      Peak postprandial:   less than 180 mg/dL (1-2 hours)      Critically ill patients:  140 - 180 mg/dL   Lab Results  Component Value Date   GLUCAP 290 (H) 02/10/2020   HGBA1C 7.8 (A) 01/01/2020    Review of Glycemic Control Results for Brenda Santana, Brenda Santana (MRN [id number]) as of 02/10/2020 12:25  Ref. Range 02/09/2020 11:47 02/09/2020 16:40 02/09/2020 20:38 02/10/2020 07:29 02/10/2020 11:39  Glucose-Capillary Latest Ref Range: 70 - 99 mg/dL 288 (H) 297 (H) 328 (H) 145 (H) 290 (H)   To ED with N/V and Diarrhea/ Acute GIB/ Anemia (Hgb 7). +DM, COPD   Home: Amaryl 2 mg QD + Metformin 500 BID + Victoza 1.8 mg QD  Current A1c= 7.8% (01/01/20)   Current: Lantus 10 QD + Novolog 0-15 ac/hs.   Inpatient Diabetes Program Recommendations:   While oral medications on hold and on steroids: -Novolog 3 units tid meal coverage if eats 50% meals  Thank you, Nani Gasser. Khup Sapia, RN, MSN, CDE  Diabetes Coordinator Inpatient Glycemic Control Team Team Pager [phone] (8am-5pm) 02/10/2020 12:27 PM

## 2020-02-10 NOTE — Anesthesia Postprocedure Evaluation (Signed)
Anesthesia Post Note  Patient: Brenda Santana  Procedure(s) Performed: ESOPHAGOGASTRODUODENOSCOPY (EGD) WITH PROPOFOL (N/A )  Patient location during evaluation: Endoscopy Anesthesia Type: General Level of consciousness: awake and alert Pain management: pain level controlled Vital Signs Assessment: post-procedure vital signs reviewed and stable Respiratory status: spontaneous breathing, nonlabored ventilation, respiratory function stable and patient connected to nasal cannula oxygen Cardiovascular status: blood pressure returned to baseline and stable Postop Assessment: no apparent nausea or vomiting Anesthetic complications: no   No complications documented.   Last Vitals:  Vitals:   02/09/20 2229 02/10/20 0425  BP: 125/60 (!) 122/48  Pulse: 85 89  Resp:  20  Temp:  36.8 C  SpO2: 100% 96%    Last Pain:  Vitals:   02/10/20 0425  TempSrc: Oral  PainSc:                  Martha Clan

## 2020-02-13 ENCOUNTER — Encounter: Payer: Self-pay | Admitting: Internal Medicine

## 2020-02-13 ENCOUNTER — Ambulatory Visit (INDEPENDENT_AMBULATORY_CARE_PROVIDER_SITE_OTHER): Payer: Medicaid Other | Admitting: Internal Medicine

## 2020-02-13 ENCOUNTER — Encounter (INDEPENDENT_AMBULATORY_CARE_PROVIDER_SITE_OTHER): Payer: Self-pay

## 2020-02-13 ENCOUNTER — Other Ambulatory Visit: Payer: Self-pay

## 2020-02-13 VITALS — BP 118/77 | HR 82 | Resp 16 | Ht 66.0 in | Wt 270.0 lb

## 2020-02-13 DIAGNOSIS — K922 Gastrointestinal hemorrhage, unspecified: Secondary | ICD-10-CM

## 2020-02-13 DIAGNOSIS — R600 Localized edema: Secondary | ICD-10-CM

## 2020-02-13 DIAGNOSIS — E1165 Type 2 diabetes mellitus with hyperglycemia: Secondary | ICD-10-CM | POA: Diagnosis not present

## 2020-02-13 DIAGNOSIS — I10 Essential (primary) hypertension: Secondary | ICD-10-CM

## 2020-02-13 DIAGNOSIS — Z79899 Other long term (current) drug therapy: Secondary | ICD-10-CM

## 2020-02-13 DIAGNOSIS — D649 Anemia, unspecified: Secondary | ICD-10-CM

## 2020-02-13 MED ORDER — FUROSEMIDE 20 MG PO TABS: 20.0000 mg | ORAL_TABLET | Freq: Every day | ORAL | 3 refills | Status: DC

## 2020-02-13 NOTE — Progress Notes (Signed)
[facility] [place], [place] [address]  Internal MEDICINE  Office Visit Note  Patient Name: Brenda Santana  [id number]  [id number]  Date of Service: 02/14/2020     Chief Complaint  Patient presents with  . Hospitalization Follow-up    elevated blood sugars organs shutting down  . controlled substance policy    acknowledged  . Quality Metric Gaps    Hep C screen, eye exam Mary Sella -feb 2021), Tdap     HPI Pt is here for recent hospital follow up. Pt was hospitalized for UGI bleed, hg dropped from 13 to 7, she was hypotensive as well. EGD showed gastric ulcer. She has multiple medical problems, H/O remote DVT, h/o RUE arterial thrombosis (PVD) has been maintained on chronic coumadin therapy. Recently NSAIDS were added for her treatment plan from orthopedics, she started having GI bleed. She feels bloated and is c/o generalized edema, she is off of coumadin at the moment, her Protonix was increased 2 x day. She brought a list of her medications.  Current Medication: Outpatient Encounter Medications as of 02/13/2020  Medication Sig  . Accu-Chek FastClix Lancets MISC Use as directed once a daily diag E11.9  . acyclovir (ZOVIRAX) 400 MG tablet TAKE 1 TABLET BY MOUTH TWICE A DAY (Patient taking differently: Take 400 mg by mouth 2 (two) times daily. )  . acyclovir ointment (ZOVIRAX) 5 % APPLY TOPICALLY EVERY 3 (THREE) HOURS.  Marland Kitchen albuterol (PROAIR HFA) 108 (90 Base) MCG/ACT inhaler INHALE 2 PUFFS INTO THE LUNGS EVERY 4 (FOUR) HOURS AS NEEDED FOR WHEEZING OR SHORTNESS OF BREATH.  Marland Kitchen allopurinol (ZYLOPRIM) 300 MG tablet TAKE ONE TAB AT NIGHT FOR GOUT (Patient taking differently: Take 300 mg by mouth at bedtime. )  . aspirin EC 81 MG tablet Take 81 mg by mouth daily.  Marland Kitchen atorvastatin (LIPITOR) 10 MG tablet TAKE 1 TABLET BY MOUTH EVERY DAY (Patient taking differently: Take 10 mg by mouth daily. )  . baclofen (LIORESAL) 10 MG tablet Take 1 tablet (10 mg total) by mouth 3  (three) times daily.  . BD PEN NEEDLE NANO U/F 32G X 4 MM MISC USE DAILY WITH VICTOZA  . Biotin 5 MG CAPS Take 5 mg by mouth daily.   . Budeson-Glycopyrrol-Formoterol (BREZTRI AEROSPHERE) 160-9-4.8 MCG/ACT AERO Inhale 2 puffs into the lungs in the morning and at bedtime.  . cetirizine (ZYRTEC ALLERGY) 10 MG tablet Take 1 tablet (10 mg total) by mouth daily as needed for allergies.  . clobetasol cream (TEMOVATE) 8.52 % Apply 1 application topically 2 (two) times daily as needed (for eczema on hands).   . diclofenac sodium (VOLTAREN) 1 % GEL APPLY 4 G TOPICALLY FOUR (4) TIMES A DAY.  Marland Kitchen docusate sodium (COLACE) 100 MG capsule Take 100 mg by mouth daily.   Marland Kitchen doxycycline (VIBRA-TABS) 100 MG tablet TAKE 1 TABLET BY MOUTH TWICE A DAY (Patient taking differently: Take 100 mg by mouth 2 (two) times daily. )  . Ferrous Sulfate (IRON) 325 (65 Fe) MG TABS Take 325 mg by mouth 3 (three) times daily.   Marland Kitchen gabapentin (NEURONTIN) 400 MG capsule Take 3 capsules (1,200 mg total) by mouth 3 (three) times daily.  Marland Kitchen glimepiride (AMARYL) 2 MG tablet TAKE 1 TABLET BY MOUTH DAILY BEFORE BREAKFAST. (Patient taking differently: Take 2 mg by mouth daily with breakfast. )  . glucose blood (ACCU-CHEK GUIDE) test strip 1 each by Other route daily. Use as instructed  Once daily diag e11.9  . ipratropium-albuterol (  DUONEB) 0.5-2.5 (3) MG/3ML SOLN Take 3 mLs by nebulization every 6 (six) hours as needed (for shortness of breath or wheezing).   Marland Kitchen lidocaine (XYLOCAINE) 5 % ointment APPLY A SMALL AMOUNT TO AFFECTED AREA 2-3 TIMES A DAY  . LINZESS 145 MCG CAPS capsule TAKE 1 CAPSULE BY MOUTH EVERY DAY BEFORE BREAKFAST (Patient taking differently: Take 145 mcg by mouth daily before breakfast. )  . lisinopril (ZESTRIL) 10 MG tablet TAKE 1 TAB DAILY IN MORNING  . metFORMIN (GLUCOPHAGE) 500 MG tablet Take 1 tablet (500 mg total) by mouth 2 (two) times daily with a meal.  . metoprolol tartrate (LOPRESSOR) 50 MG tablet TAKE 1 TABLET BY MOUTH  TWICE A DAY  . montelukast (SINGULAIR) 10 MG tablet TAKE 1 TABLET BY MOUTH DAILY FOR ASTHMA  . nystatin (NYSTATIN) powder Apply 1 application topically in the morning, at noon, in the evening, and at bedtime.  . ondansetron (ZOFRAN ODT) 4 MG disintegrating tablet Allow 1-2 tablets to dissolve in your mouth every 8 hours as needed for nausea/vomiting  . oxymetazoline (AFRIN) 0.05 % nasal spray Place 1 spray into both nostrils 2 (two) times daily as needed for congestion.  . pantoprazole (PROTONIX) 40 MG tablet Take 1 tablet (40 mg total) by mouth 2 (two) times daily.  . polyethylene glycol (MIRALAX / GLYCOLAX) 17 g packet Take 17 g by mouth daily.  . predniSONE (DELTASONE) 10 MG tablet Take 1 tablet three times daily with a meal for 2 days followed by 1 tablet two days for 2 days. Then resume 10 mg daily as prescribed.  . SYMBICORT 160-4.5 MCG/ACT inhaler INHALE 2 PUFF TWICE A DAY  . traMADol (ULTRAM) 50 MG tablet Take 1 tablet (50 mg total) by mouth every 8 (eight) hours as needed for moderate pain.  Marland Kitchen tretinoin (RETIN-A) 0.025 % cream Apply 1 application topically at bedtime as needed (for eczema on face).   Marland Kitchen VICTOZA 18 MG/3ML SOPN INJECT 1.8 MG UNDER THE SKIN ONCE DAILY IN THE MORNING  . [DISCONTINUED] chlorthalidone (HYGROTON) 25 MG tablet TAKE 1 TABLET BY MOUTH EVERY DAY IN THE MORNING (Patient taking differently: Take 25 mg by mouth daily. )  . [DISCONTINUED] INCRUSE ELLIPTA 62.5 MCG/INH AEPB TAKE ONE PUFF EVERY DAY  . [DISCONTINUED] predniSONE (DELTASONE) 5 MG tablet Take 2 tab po daily (Patient taking differently: Take 10 mg by mouth daily with breakfast. )  . furosemide (LASIX) 20 MG tablet Take 1 tablet (20 mg total) by mouth daily.   No facility-administered encounter medications on file as of 02/13/2020.    Surgical History: Past Surgical History:  Procedure Laterality Date  . APPENDECTOMY    . COLONOSCOPY WITH PROPOFOL N/A 02/02/2018   Procedure: COLONOSCOPY WITH PROPOFOL;   Surgeon: Jonathon Bellows, MD;  Location: [facility] ENDOSCOPY;  Service: Gastroenterology;  Laterality: N/A;  . ESOPHAGOGASTRODUODENOSCOPY (EGD) WITH PROPOFOL N/A 02/08/2020   Procedure: ESOPHAGOGASTRODUODENOSCOPY (EGD) WITH PROPOFOL;  Surgeon: Jonathon Bellows, MD;  Location: [facility] ENDOSCOPY;  Service: Gastroenterology;  Laterality: N/A;  . LAPAROSCOPIC APPENDECTOMY N/A 02/05/2018   Procedure: APPENDECTOMY LAPAROSCOPIC;  Surgeon: Jules Husbands, MD;  Location: [facility] ORS;  Service: General;  Laterality: N/A;  . right arm surgery    . TUBAL LIGATION      Medical History: Past Medical History:  Diagnosis Date  . Asthma   . Atopic dermatitis   . Collagen vascular disease (HCC)    Rhematoid Arthritis  . Constipation   . COPD (chronic obstructive pulmonary disease) ([place])   .  Diabetes mellitus without complication ([place])   . DVT (deep venous thrombosis) ([place])   . GERD (gastroesophageal reflux disease)   . Hyperlipidemia   . Hypertension   . Rheumatoid arthritis ([place])     Family History: Family History  Problem Relation Age of Onset  . Breast cancer Mother   . Hypertension Mother   . Diabetes Mother   . Hypertension Father   . Heart failure Father     Social History   Socioeconomic History  . Marital status: Single    Spouse name: Not on file  . Number of children: Not on file  . Years of education: Not on file  . Highest education level: Not on file  Occupational History  . Not on file  Tobacco Use  . Smoking status: Former Research scientist (life sciences)  . Smokeless tobacco: Never Used  Vaping Use  . Vaping Use: Never used  Substance and Sexual Activity  . Alcohol use: No  . Drug use: No  . Sexual activity: Yes  Other Topics Concern  . Not on file  Social History Narrative  . Not on file   Social Determinants of Health   Financial Resource Strain:   . Difficulty of Paying Living Expenses: Not on file  Food Insecurity:   . Worried About Charity fundraiser in the Last Year: Not on file  . Ran Out of  Food in the Last Year: Not on file  Transportation Needs:   . Lack of Transportation (Medical): Not on file  . Lack of Transportation (Non-Medical): Not on file  Physical Activity:   . Days of Exercise per Week: Not on file  . Minutes of Exercise per Session: Not on file  Stress:   . Feeling of Stress : Not on file  Social Connections:   . Frequency of Communication with Friends and Family: Not on file  . Frequency of Social Gatherings with Friends and Family: Not on file  . Attends Religious Services: Not on file  . Active Member of Clubs or Organizations: Not on file  . Attends Archivist Meetings: Not on file  . Marital Status: Not on file  Intimate Partner Violence:   . Fear of Current or Ex-Partner: Not on file  . Emotionally Abused: Not on file  . Physically Abused: Not on file  . Sexually Abused: Not on file      Review of Systems  Constitutional: Negative for chills, diaphoresis and fatigue.  HENT: Negative for ear pain, postnasal drip and sinus pressure.   Eyes: Negative for photophobia, discharge, redness, itching and visual disturbance.  Respiratory: Positive for shortness of breath. Negative for cough and wheezing.   Cardiovascular: Positive for leg swelling. Negative for chest pain and palpitations.  Gastrointestinal: Negative for abdominal pain, constipation, diarrhea, nausea and vomiting.  Genitourinary: Negative for dysuria and flank pain.  Musculoskeletal: Negative for arthralgias, back pain, gait problem and neck pain.  Skin: Negative for color change.  Allergic/Immunologic: Negative for environmental allergies and food allergies.  Neurological: Positive for weakness. Negative for dizziness and headaches.  Hematological: Does not bruise/bleed easily.  Psychiatric/Behavioral: Negative for agitation, behavioral problems (depression) and hallucinations.    Vital Signs: BP 118/77   Pulse 82   Resp 16   Ht 5\' 6"  (1.676 m)   Wt 270 lb (122.5 kg)    SpO2 98%   BMI 43.58 kg/m    Physical Exam Constitutional:      Appearance: She is obese.  HENT:  Head: Normocephalic and atraumatic.  Eyes:     Extraocular Movements: Extraocular movements intact.     Pupils: Pupils are equal, round, and reactive to light.  Cardiovascular:     Rate and Rhythm: Normal rate.  Pulmonary:     Effort: Pulmonary effort is normal.     Comments: Reduced air entry with occasional wheezing  Musculoskeletal:        General: No swelling.     Right lower leg: Edema present.     Left lower leg: Edema present.  Neurological:     General: No focal deficit present.     Mental Status: She is alert and oriented to person, place, and time.     Assessment/Plan: 1. Acute upper GI bleed Pt has gastric ulcer, most like due to combination of coumadin and NSAIDS use. Will have to hold coumadin for now, increase Protonix bid  - CBC With Differential - Vitamin B12 - Folate - Iron and TIBC - Ferritin  2. Essential (primary) hypertension Continue all meds as before  - Basic metabolic panel  3. Uncontrolled type 2 diabetes mellitus with hyperglycemia (HCC) Continue to monitor   4. Bilateral lower extremity edema Will DC HCTZ fir now, start Lasix  - furosemide (LASIX) 20 MG tablet; Take 1 tablet (20 mg total) by mouth daily.  Dispense: 30 tablet; Refill: 3  5. Encounter for medication review Pt is on multiple, duplicate medications, reviewed and med list is updated   General Counseling: Dianne verbalizes understanding of the findings of todays visit and agrees with plan of treatment. I have discussed any further diagnostic evaluation that may be needed or ordered today. We also reviewed her medications today. she has been encouraged to call the office with any questions or concerns that should arise related to todays visit.   Orders Placed This Encounter  Procedures  . CBC With Differential  . Vitamin B12  . Folate  . Iron and TIBC  . Ferritin  .  Basic metabolic panel   Meds ordered this encounter  Medications  . furosemide (LASIX) 20 MG tablet    Sig: Take 1 tablet (20 mg total) by mouth daily.    Dispense:  30 tablet    Refill:  3      I have reviewed all medical records from hospital follow up including radiology reports and consults from other physicians. Appropriate follow up diagnostics will be scheduled as needed. Patient/ Family understands the plan of treatment. Time spent45 minutes.   Dr Lavera Guise, MD Internal Medicine

## 2020-02-17 ENCOUNTER — Other Ambulatory Visit: Payer: Self-pay | Admitting: Adult Health

## 2020-02-18 ENCOUNTER — Other Ambulatory Visit: Payer: Self-pay | Admitting: Adult Health

## 2020-02-18 ENCOUNTER — Telehealth: Payer: Self-pay

## 2020-02-18 LAB — CBC WITH DIFFERENTIAL
Basophils Absolute: 0 10*3/uL (ref 0.0–0.2)
Basos: 0 %
EOS (ABSOLUTE): 0.2 10*3/uL (ref 0.0–0.4)
Eos: 2 %
Hematocrit: 28.7 % — ABNORMAL LOW (ref 34.0–46.6)
Hemoglobin: 9.2 g/dL — ABNORMAL LOW (ref 11.1–15.9)
Immature Grans (Abs): 0 10*3/uL (ref 0.0–0.1)
Immature Granulocytes: 0 %
Lymphocytes Absolute: 2.4 10*3/uL (ref 0.7–3.1)
Lymphs: 24 %
MCH: 30.2 pg (ref 26.6–33.0)
MCHC: 32.1 g/dL (ref 31.5–35.7)
MCV: 94 fL (ref 79–97)
Monocytes Absolute: 1 10*3/uL — ABNORMAL HIGH (ref 0.1–0.9)
Monocytes: 10 %
Neutrophils Absolute: 6.4 10*3/uL (ref 1.4–7.0)
Neutrophils: 64 %
RBC: 3.05 x10E6/uL — ABNORMAL LOW (ref 3.77–5.28)
RDW: 17.5 % — ABNORMAL HIGH (ref 11.7–15.4)
WBC: 10 10*3/uL (ref 3.4–10.8)

## 2020-02-18 LAB — IRON AND TIBC
Iron Saturation: 10 % — ABNORMAL LOW (ref 15–55)
Iron: 29 ug/dL (ref 27–159)
Total Iron Binding Capacity: 305 ug/dL (ref 250–450)
UIBC: 276 ug/dL (ref 131–425)

## 2020-02-18 LAB — BASIC METABOLIC PANEL
BUN/Creatinine Ratio: 14 (ref 9–23)
BUN: 16 mg/dL (ref 6–24)
CO2: 26 mmol/L (ref 20–29)
Calcium: 9.1 mg/dL (ref 8.7–10.2)
Chloride: 102 mmol/L (ref 96–106)
Creatinine, Ser: 1.11 mg/dL — ABNORMAL HIGH (ref 0.57–1.00)
GFR calc Af Amer: 63 mL/min/{1.73_m2} (ref >59)
GFR calc non Af Amer: 54 mL/min/{1.73_m2} — ABNORMAL LOW (ref >59)
Glucose: 105 mg/dL — ABNORMAL HIGH (ref 65–99)
Potassium: 4.4 mmol/L (ref 3.5–5.2)
Sodium: 142 mmol/L (ref 134–144)

## 2020-02-18 LAB — FERRITIN: Ferritin: 134 ng/mL (ref 15–150)

## 2020-02-18 LAB — VITAMIN B12: Vitamin B-12: 411 pg/mL (ref 232–1245)

## 2020-02-18 LAB — FOLATE: Folate: 11.8 ng/mL (ref >3.0)

## 2020-02-18 NOTE — Telephone Encounter (Signed)
Pt had enough for now she going call us back

## 2020-02-18 NOTE — Progress Notes (Signed)
Labs reviewed and will be dicussed on next visit

## 2020-02-18 NOTE — Telephone Encounter (Signed)
Pt called that she need  Hold coumadin for her procedure and then she can go back to coumadin as per dr Humphrey Rolls she can go back normal dosing how she had enough for now she is going to call us back with exact how she is taking and how many 5 mg she need refills

## 2020-02-19 ENCOUNTER — Other Ambulatory Visit: Payer: Self-pay

## 2020-02-19 NOTE — Telephone Encounter (Signed)
Pt called back she take coumadin 7.5 mg daily except Saturday Sunday she take 2 tab I  Put on medication list pt had enough  Until her appt

## 2020-02-25 ENCOUNTER — Encounter: Payer: Self-pay | Admitting: Internal Medicine

## 2020-02-25 ENCOUNTER — Other Ambulatory Visit: Payer: Self-pay

## 2020-02-25 ENCOUNTER — Ambulatory Visit (INDEPENDENT_AMBULATORY_CARE_PROVIDER_SITE_OTHER): Payer: Medicaid Other | Admitting: Internal Medicine

## 2020-02-25 DIAGNOSIS — E1165 Type 2 diabetes mellitus with hyperglycemia: Secondary | ICD-10-CM | POA: Diagnosis not present

## 2020-02-25 DIAGNOSIS — K922 Gastrointestinal hemorrhage, unspecified: Secondary | ICD-10-CM | POA: Diagnosis not present

## 2020-02-25 DIAGNOSIS — I700 Atherosclerosis of aorta: Secondary | ICD-10-CM

## 2020-02-25 DIAGNOSIS — M5441 Lumbago with sciatica, right side: Secondary | ICD-10-CM

## 2020-02-25 DIAGNOSIS — Z7901 Long term (current) use of anticoagulants: Secondary | ICD-10-CM

## 2020-02-25 DIAGNOSIS — G8929 Other chronic pain: Secondary | ICD-10-CM

## 2020-02-25 DIAGNOSIS — M5442 Lumbago with sciatica, left side: Secondary | ICD-10-CM

## 2020-02-25 DIAGNOSIS — M0579 Rheumatoid arthritis with rheumatoid factor of multiple sites without organ or systems involvement: Secondary | ICD-10-CM

## 2020-02-25 DIAGNOSIS — I2721 Secondary pulmonary arterial hypertension: Secondary | ICD-10-CM

## 2020-02-25 LAB — POCT INR: INR: 1.6 — AB (ref 2.0–3.0)

## 2020-02-25 MED ORDER — ACETAMINOPHEN-CODEINE #3 300-30 MG PO TABS: ORAL_TABLET | ORAL | 0 refills | Status: DC

## 2020-02-25 MED ORDER — GABAPENTIN 800 MG PO TABS: 800.0000 mg | ORAL_TABLET | Freq: Three times a day (TID) | ORAL | 2 refills | Status: DC

## 2020-02-25 MED ORDER — TRAMADOL HCL 50 MG PO TABS: 50.0000 mg | ORAL_TABLET | Freq: Three times a day (TID) | ORAL | 1 refills | Status: DC | PRN

## 2020-02-25 NOTE — Progress Notes (Signed)
[facility] [place], [place] [address]  Internal MEDICINE  Office Visit Note  Patient Name: Brenda Santana  [id number]  [id number]  Date of Service: 02/25/2020  Chief Complaint  Patient presents with  . Follow-up    balance feels off, pt is still experencing pain in both legs, ankles have swelling  . Diabetes  . Hyperlipidemia  . Hypertension  . Asthma  . policy update form    received    HPI  Pt is here for routine follow up, her hctz was stopped and lasix was started , she feels better today, she is back on coumadin 1 mg po qd for arterial thrombosis. Her pain is controlled with T+C bid and tramadol. Pt continues to have SOB, copd or Rheumatoid lung disease. Diabetes is under good control however will change therapy due to better drug profile   Pt was hospitalized for UGI bleed, hg dropped from 13 to 7, she was hypotensive as well. EGD showed gastric ulcer. She has multiple medical problems, H/O remote DVT, h/o RUE arterial thrombosis (PVD) has been maintained on chronic coumadin therapy. Recently NSAIDS were added for her treatment plan from orthopedics, she started having GI bleed. She feels bloated and is c/o generalized edema,, her Protonix was increased 2 x day. She brought a list of her medications. 1. The left ventricle is normal in size with mildly increased wall  thickness.  2. The left ventricular systolic function is normal, LVEF is visually  estimated at > 55%.  3. There is grade I diastolic dysfunction (impaired relaxation).  4. The left atrium is mildly dilated in size.  5. The right ventricle is normal in size, with normal systolic function.  6. There is mild-moderate pulmonary hypertension, estimated pulmonary artery  systolic pressure is 49 mmHg.   Current Medication: Outpatient Encounter Medications as of 02/25/2020  Medication Sig  . Accu-Chek FastClix Lancets MISC Use as directed once a daily diag E11.9  .  acetaminophen-codeine (TYLENOL #3) 300-30 MG tablet Take one tab po bid for chronic pain  . acyclovir (ZOVIRAX) 400 MG tablet TAKE 1 TABLET BY MOUTH TWICE A DAY (Patient taking differently: Take 400 mg by mouth 2 (two) times daily. )  . acyclovir ointment (ZOVIRAX) 5 % APPLY TOPICALLY EVERY 3 (THREE) HOURS.  Marland Kitchen albuterol (VENTOLIN HFA) 108 (90 Base) MCG/ACT inhaler INHALE 2 PUFFS INTO THE LUNGS EVERY 4 (FOUR) HOURS AS NEEDED FOR WHEEZING OR SHORTNESS OF BREATH.  Marland Kitchen allopurinol (ZYLOPRIM) 300 MG tablet TAKE ONE TAB AT NIGHT FOR GOUT (Patient taking differently: Take 300 mg by mouth at bedtime. )  . aspirin EC 81 MG tablet Take 81 mg by mouth daily.  Marland Kitchen atorvastatin (LIPITOR) 10 MG tablet TAKE 1 TABLET BY MOUTH EVERY DAY (Patient taking differently: Take 10 mg by mouth daily. )  . baclofen (LIORESAL) 10 MG tablet Take 1 tablet (10 mg total) by mouth 3 (three) times daily.  . BD PEN NEEDLE NANO U/F 32G X 4 MM MISC USE DAILY WITH VICTOZA  . Biotin 5 MG CAPS Take 5 mg by mouth daily.   . Budeson-Glycopyrrol-Formoterol (BREZTRI AEROSPHERE) 160-9-4.8 MCG/ACT AERO Inhale 2 puffs into the lungs in the morning and at bedtime.  . cetirizine (ZYRTEC ALLERGY) 10 MG tablet Take 1 tablet (10 mg total) by mouth daily as needed for allergies.  . clobetasol cream (TEMOVATE) 6.30 % Apply 1 application topically 2 (two) times daily as needed (for eczema on hands).   . diclofenac sodium (  VOLTAREN) 1 % GEL APPLY 4 G TOPICALLY FOUR (4) TIMES A DAY.  Marland Kitchen docusate sodium (COLACE) 100 MG capsule Take 100 mg by mouth daily.   Marland Kitchen doxycycline (VIBRA-TABS) 100 MG tablet TAKE 1 TABLET BY MOUTH TWICE A DAY (Patient taking differently: Take 100 mg by mouth 2 (two) times daily. )  . Ferrous Sulfate (IRON) 325 (65 Fe) MG TABS Take 325 mg by mouth 3 (three) times daily.   . furosemide (LASIX) 20 MG tablet Take 1 tablet (20 mg total) by mouth daily.  Marland Kitchen gabapentin (NEURONTIN) 800 MG tablet Take 1 tablet (800 mg total) by mouth 3 (three)  times daily.  Marland Kitchen glucose blood (ACCU-CHEK GUIDE) test strip 1 each by Other route daily. Use as instructed  Once daily diag e11.9  . ipratropium-albuterol (DUONEB) 0.5-2.5 (3) MG/3ML SOLN Take 3 mLs by nebulization every 6 (six) hours as needed (for shortness of breath or wheezing).   Marland Kitchen lidocaine (XYLOCAINE) 5 % ointment APPLY A SMALL AMOUNT TO AFFECTED AREA 2-3 TIMES A DAY  . LINZESS 145 MCG CAPS capsule TAKE 1 CAPSULE BY MOUTH EVERY DAY BEFORE BREAKFAST (Patient taking differently: Take 145 mcg by mouth daily before breakfast. )  . lisinopril (ZESTRIL) 10 MG tablet TAKE 1 TAB DAILY IN MORNING  . metoprolol tartrate (LOPRESSOR) 50 MG tablet TAKE 1 TABLET BY MOUTH TWICE A DAY  . montelukast (SINGULAIR) 10 MG tablet TAKE 1 TABLET BY MOUTH DAILY FOR ASTHMA  . nystatin (NYSTATIN) powder Apply 1 application topically in the morning, at noon, in the evening, and at bedtime.  . ondansetron (ZOFRAN ODT) 4 MG disintegrating tablet Allow 1-2 tablets to dissolve in your mouth every 8 hours as needed for nausea/vomiting  . oxymetazoline (AFRIN) 0.05 % nasal spray Place 1 spray into both nostrils 2 (two) times daily as needed for congestion.  . pantoprazole (PROTONIX) 40 MG tablet Take 1 tablet (40 mg total) by mouth 2 (two) times daily.  . polyethylene glycol (MIRALAX / GLYCOLAX) 17 g packet Take 17 g by mouth daily.  . predniSONE (DELTASONE) 10 MG tablet Take 1 tablet three times daily with a meal for 2 days followed by 1 tablet two days for 2 days. Then resume 10 mg daily as prescribed.  . SYMBICORT 160-4.5 MCG/ACT inhaler INHALE 2 PUFF TWICE A DAY  . traMADol (ULTRAM) 50 MG tablet Take 1 tablet (50 mg total) by mouth every 8 (eight) hours as needed for moderate pain.  Marland Kitchen tretinoin (RETIN-A) 0.025 % cream Apply 1 application topically at bedtime as needed (for eczema on face).   Marland Kitchen VICTOZA 18 MG/3ML SOPN INJECT 1.8 MG UNDER THE SKIN ONCE DAILY IN THE MORNING  . warfarin (COUMADIN) 7.5 MG tablet Take 7.5 mg by  mouth daily. Take 1 tab po daily except Saturday and Sunday take 2 tab po daily  . [DISCONTINUED] gabapentin (NEURONTIN) 400 MG capsule Take 3 capsules (1,200 mg total) by mouth 3 (three) times daily.  . [DISCONTINUED] glimepiride (AMARYL) 2 MG tablet TAKE 1 TABLET BY MOUTH DAILY BEFORE BREAKFAST. (Patient taking differently: Take 2 mg by mouth daily with breakfast. )  . [DISCONTINUED] metFORMIN (GLUCOPHAGE) 500 MG tablet Take 1 tablet (500 mg total) by mouth 2 (two) times daily with a meal.  . [DISCONTINUED] traMADol (ULTRAM) 50 MG tablet Take 1 tablet (50 mg total) by mouth every 8 (eight) hours as needed for moderate pain.   No facility-administered encounter medications on file as of 02/25/2020.    Surgical History: Past  Surgical History:  Procedure Laterality Date  . APPENDECTOMY    . COLONOSCOPY WITH PROPOFOL N/A 02/02/2018   Procedure: COLONOSCOPY WITH PROPOFOL;  Surgeon: Jonathon Bellows, MD;  Location: [facility] ENDOSCOPY;  Service: Gastroenterology;  Laterality: N/A;  . ESOPHAGOGASTRODUODENOSCOPY (EGD) WITH PROPOFOL N/A 02/08/2020   Procedure: ESOPHAGOGASTRODUODENOSCOPY (EGD) WITH PROPOFOL;  Surgeon: Jonathon Bellows, MD;  Location: [facility] ENDOSCOPY;  Service: Gastroenterology;  Laterality: N/A;  . LAPAROSCOPIC APPENDECTOMY N/A 02/05/2018   Procedure: APPENDECTOMY LAPAROSCOPIC;  Surgeon: Jules Husbands, MD;  Location: [facility] ORS;  Service: General;  Laterality: N/A;  . right arm surgery    . TUBAL LIGATION      Medical History: Past Medical History:  Diagnosis Date  . Asthma   . Atopic dermatitis   . Collagen vascular disease (HCC)    Rhematoid Arthritis  . Constipation   . COPD (chronic obstructive pulmonary disease) ([place])   . Diabetes mellitus without complication ([place])   . DVT (deep venous thrombosis) ([place])   . GERD (gastroesophageal reflux disease)   . Hyperlipidemia   . Hypertension   . Rheumatoid arthritis ([place])     Family History: Family History  Problem Relation Age of Onset  .  Breast cancer Mother   . Hypertension Mother   . Diabetes Mother   . Hypertension Father   . Heart failure Father     Social History   Socioeconomic History  . Marital status: Single    Spouse name: Not on file  . Number of children: Not on file  . Years of education: Not on file  . Highest education level: Not on file  Occupational History  . Not on file  Tobacco Use  . Smoking status: Former Research scientist (life sciences)  . Smokeless tobacco: Never Used  Vaping Use  . Vaping Use: Never used  Substance and Sexual Activity  . Alcohol use: No  . Drug use: No  . Sexual activity: Yes  Other Topics Concern  . Not on file  Social History Narrative  . Not on file   Social Determinants of Health   Financial Resource Strain:   . Difficulty of Paying Living Expenses: Not on file  Food Insecurity:   . Worried About Charity fundraiser in the Last Year: Not on file  . Ran Out of Food in the Last Year: Not on file  Transportation Needs:   . Lack of Transportation (Medical): Not on file  . Lack of Transportation (Non-Medical): Not on file  Physical Activity:   . Days of Exercise per Week: Not on file  . Minutes of Exercise per Session: Not on file  Stress:   . Feeling of Stress : Not on file  Social Connections:   . Frequency of Communication with Friends and Family: Not on file  . Frequency of Social Gatherings with Friends and Family: Not on file  . Attends Religious Services: Not on file  . Active Member of Clubs or Organizations: Not on file  . Attends Archivist Meetings: Not on file  . Marital Status: Not on file  Intimate Partner Violence:   . Fear of Current or Ex-Partner: Not on file  . Emotionally Abused: Not on file  . Physically Abused: Not on file  . Sexually Abused: Not on file      Review of Systems  Constitutional: Negative for chills, diaphoresis and fatigue.  HENT: Negative for ear pain, postnasal drip and sinus pressure.   Eyes: Negative for photophobia,  discharge, redness, itching and visual disturbance.  Respiratory: Positive for shortness of breath. Negative for cough and wheezing.   Cardiovascular: Positive for leg swelling. Negative for chest pain and palpitations.  Gastrointestinal: Negative for abdominal pain, constipation, diarrhea, nausea and vomiting.  Genitourinary: Negative for dysuria and flank pain.  Musculoskeletal: Negative for arthralgias, back pain, gait problem and neck pain.  Skin: Negative for color change.  Allergic/Immunologic: Negative for environmental allergies and food allergies.  Neurological: Negative for dizziness and headaches.  Hematological: Does not bruise/bleed easily.  Psychiatric/Behavioral: Negative for agitation, behavioral problems (depression) and hallucinations.    Vital Signs: BP (!) 142/88   Pulse 97   Temp 97.7 F (36.5 C)   Resp 16   Ht 5\' 6"  (1.676 m)   Wt 268 lb (121.6 kg)   SpO2 99%   BMI 43.26 kg/m    Physical Exam Constitutional:      General: She is not in acute distress.    Appearance: She is well-developed. She is not diaphoretic.  HENT:     Head: Normocephalic and atraumatic.     Mouth/Throat:     Pharynx: No oropharyngeal exudate.  Eyes:     Pupils: Pupils are equal, round, and reactive to light.  Neck:     Thyroid: No thyromegaly.     Vascular: No JVD.     Trachea: No tracheal deviation.  Cardiovascular:     Rate and Rhythm: Normal rate and regular rhythm.     Heart sounds: Normal heart sounds. No murmur heard.  No friction rub. No gallop.   Pulmonary:     Effort: Pulmonary effort is normal. No respiratory distress.     Breath sounds: No wheezing or rales.     Comments: Decreased bs  Chest:     Chest wall: No tenderness.  Abdominal:     General: Bowel sounds are normal.     Palpations: Abdomen is soft.  Musculoskeletal:        General: Swelling present. Normal range of motion.     Cervical back: Normal range of motion and neck supple.  Lymphadenopathy:      Cervical: No cervical adenopathy.  Skin:    General: Skin is warm and dry.  Neurological:     Mental Status: She is alert and oriented to person, place, and time.     Cranial Nerves: No cranial nerve deficit.  Psychiatric:        Behavior: Behavior normal.        Thought Content: Thought content normal.        Judgment: Judgment normal.    Assessment/Plan: 1. Uncontrolled type 2 diabetes mellitus with hyperglycemia (HCC) Will DC metformin due to abnormal kidney functions, continue Victoza and add low dose Farxiga 5 mg qd.  2. Acute upper GI bleed Continue Protonix 40 mg bid, might need to look into dropping her coumadin   3. Chronic bilateral low back pain with bilateral sciatica Continue tramadol, gabapentin and add low dose  T+C for now. Pt will need to se ortho   4. Long term (current) use of anticoagulants Continue Coumadin - POCT INR  5. Atherosclerosis of aorta (HCC) Pt is on Lipitor   6. Rheumatoid arthritis involving multiple sites with positive rheumatoid factor ([place]) Will recheck her RA level, need to see Rheumatology as well, look into rheumatoid lung disease  - traMADol (ULTRAM) 50 MG tablet; Take 1 tablet (50 mg total) by mouth every 8 (eight) hours as needed for moderate pain.  Dispense: 90 tablet; Refill: 1  7. PAH (  pulmonary artery hypertension) ([place]) Echo did show pulmonary HTN, will need further testing, pt does have OSA   General Counseling: Lashawn verbalizes understanding of the findings of todays visit and agrees with plan of treatment. I have discussed any further diagnostic evaluation that may be needed or ordered today. We also reviewed her medications today. she has been encouraged to call the office with any questions or concerns that should arise related to todays visit.   Orders Placed This Encounter  Procedures  . POCT INR    Meds ordered this encounter  Medications  . acetaminophen-codeine (TYLENOL #3) 300-30 MG tablet    Sig: Take one  tab po bid for chronic pain    Dispense:  60 tablet    Refill:  0  . traMADol (ULTRAM) 50 MG tablet    Sig: Take 1 tablet (50 mg total) by mouth every 8 (eight) hours as needed for moderate pain.    Dispense:  90 tablet    Refill:  1    To be picked up after 11/3  . gabapentin (NEURONTIN) 800 MG tablet    Sig: Take 1 tablet (800 mg total) by mouth 3 (three) times daily.    Dispense:  90 tablet    Refill:  2    Total time spent:30 Minutes Time spent includes review of chart, medications, test results, and follow up plan with the patient.    Dr Lavera Guise Internal medicine

## 2020-02-27 ENCOUNTER — Other Ambulatory Visit: Payer: Self-pay

## 2020-03-04 ENCOUNTER — Other Ambulatory Visit: Payer: Self-pay

## 2020-03-04 ENCOUNTER — Ambulatory Visit
Admission: RE | Admit: 2020-03-04 | Discharge: 2020-03-04 | Disposition: A | Discharge status: Home / Self Care | Payer: Medicare Other | Attending: Hospice and Palliative Medicine | Admitting: Hospice and Palliative Medicine

## 2020-03-04 ENCOUNTER — Encounter: Payer: Self-pay | Admitting: Hospice and Palliative Medicine

## 2020-03-04 ENCOUNTER — Ambulatory Visit
Admission: RE | Admit: 2020-03-04 | Discharge: 2020-03-04 | Disposition: A | Discharge status: Home / Self Care | Payer: Medicare Other | Source: Ambulatory Visit | Attending: Hospice and Palliative Medicine | Admitting: Hospice and Palliative Medicine

## 2020-03-04 ENCOUNTER — Ambulatory Visit (INDEPENDENT_AMBULATORY_CARE_PROVIDER_SITE_OTHER): Payer: Medicaid Other | Admitting: Hospice and Palliative Medicine

## 2020-03-04 DIAGNOSIS — R0609 Other forms of dyspnea: Secondary | ICD-10-CM

## 2020-03-04 DIAGNOSIS — E66813 Obesity, class 3: Secondary | ICD-10-CM

## 2020-03-04 DIAGNOSIS — J449 Chronic obstructive pulmonary disease, unspecified: Secondary | ICD-10-CM

## 2020-03-04 DIAGNOSIS — R0600 Dyspnea, unspecified: Secondary | ICD-10-CM

## 2020-03-04 DIAGNOSIS — I2721 Secondary pulmonary arterial hypertension: Secondary | ICD-10-CM

## 2020-03-04 DIAGNOSIS — G8929 Other chronic pain: Secondary | ICD-10-CM

## 2020-03-04 DIAGNOSIS — M5441 Lumbago with sciatica, right side: Secondary | ICD-10-CM

## 2020-03-04 DIAGNOSIS — M5442 Lumbago with sciatica, left side: Secondary | ICD-10-CM

## 2020-03-04 DIAGNOSIS — Z7901 Long term (current) use of anticoagulants: Secondary | ICD-10-CM

## 2020-03-04 LAB — POCT INR: INR: 2.2 (ref 2.0–3.0)

## 2020-03-04 MED ORDER — TRELEGY ELLIPTA 100-62.5-25 MCG/INH IN AEPB: 1.0000 | INHALATION_SPRAY | Freq: Every day | RESPIRATORY_TRACT | 0 refills | Status: DC

## 2020-03-04 NOTE — Progress Notes (Signed)
[facility] [place], [place] [address]  Internal MEDICINE  Office Visit Note  Patient Name: Brenda Santana  [id number]  [id number]  Date of Service: 03/09/2020  Chief Complaint  Patient presents with  . Follow-up  . Diabetes    fasting glucose 149  . Hypertension  . 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, was restarted after recently experiencing GI bleed GI bleed though to be secondary to taking several Aleve a day for joint pain Continues to have lower back as well as bilateral leg pain--was given tramadol at her last visit--does help but continues to have pain, also prescribed T+C for chronic pain  Noted audible wheezing while talking with patient--discussed this with her and she mentions she has heard herself wheeze for about 2-3 days Has samples of Breztri--has been using this PRN, has never used an inhaler daily Denies increased shortness of breath or coughing  She would like to discuss restarting phentermine for weight loss--again discussed the results of her echocardiogram  Current Medication: Outpatient Encounter Medications as of 03/04/2020  Medication Sig  . Accu-Chek FastClix Lancets MISC Use as directed once a daily diag E11.9  . acetaminophen-codeine (TYLENOL #3) 300-30 MG tablet Take one tab po bid for chronic pain  . acyclovir (ZOVIRAX) 400 MG tablet TAKE 1 TABLET BY MOUTH TWICE A DAY (Patient taking differently: Take 400 mg by mouth 2 (two) times daily. )  . acyclovir ointment (ZOVIRAX) 5 % APPLY TOPICALLY EVERY 3 (THREE) HOURS.  Marland Kitchen albuterol (VENTOLIN HFA) 108 (90 Base) MCG/ACT inhaler INHALE 2 PUFFS INTO THE LUNGS EVERY 4 (FOUR) HOURS AS NEEDED FOR WHEEZING OR SHORTNESS OF BREATH.  Marland Kitchen allopurinol (ZYLOPRIM) 300 MG tablet TAKE ONE TAB AT NIGHT FOR GOUT (Patient taking differently: Take 300 mg by mouth at  bedtime. )  . aspirin EC 81 MG tablet Take 81 mg by mouth daily.  Marland Kitchen atorvastatin (LIPITOR) 10 MG tablet TAKE 1 TABLET BY MOUTH EVERY DAY (Patient taking differently: Take 10 mg by mouth daily. )  . baclofen (LIORESAL) 10 MG tablet Take 1 tablet (10 mg total) by mouth 3 (three) times daily.  . BD PEN NEEDLE NANO U/F 32G X 4 MM MISC USE DAILY WITH VICTOZA  . Biotin 5 MG CAPS Take 5 mg by mouth daily.   . Budeson-Glycopyrrol-Formoterol (BREZTRI AEROSPHERE) 160-9-4.8 MCG/ACT AERO Inhale 2 puffs into the lungs in the morning and at bedtime.  . cetirizine (ZYRTEC ALLERGY) 10 MG tablet Take 1 tablet (10 mg total) by mouth daily as needed for allergies.  . clobetasol cream (TEMOVATE) 1.27 % Apply 1 application topically 2 (two) times daily as needed (for eczema on hands).   . diclofenac sodium (VOLTAREN) 1 % GEL APPLY 4 G TOPICALLY FOUR (4) TIMES A DAY.  Marland Kitchen docusate sodium (COLACE) 100 MG capsule Take 100 mg by mouth daily.   Marland Kitchen doxycycline (VIBRA-TABS) 100 MG tablet TAKE 1 TABLET BY MOUTH TWICE A DAY (Patient taking differently: Take 100 mg by mouth 2 (two) times daily. )  . Ferrous Sulfate (IRON) 325 (65 Fe) MG TABS Take 325 mg by mouth 3 (three) times daily.   . furosemide (LASIX) 20 MG tablet Take 1 tablet (20 mg total) by mouth daily.  Marland Kitchen gabapentin (NEURONTIN) 800 MG tablet Take 1 tablet (800 mg total) by mouth 3 (three) times  daily.  . glucose blood (ACCU-CHEK GUIDE) test strip 1 each by Other route daily. Use as instructed  Once daily diag e11.9  . ipratropium-albuterol (DUONEB) 0.5-2.5 (3) MG/3ML SOLN Take 3 mLs by nebulization every 6 (six) hours as needed (for shortness of breath or wheezing).   Marland Kitchen lidocaine (XYLOCAINE) 5 % ointment APPLY A SMALL AMOUNT TO AFFECTED AREA 2-3 TIMES A DAY  . LINZESS 145 MCG CAPS capsule TAKE 1 CAPSULE BY MOUTH EVERY DAY BEFORE BREAKFAST (Patient taking differently: Take 145 mcg by mouth daily before breakfast. )  . lisinopril (ZESTRIL) 10 MG tablet TAKE 1 TAB DAILY IN  MORNING  . metoprolol tartrate (LOPRESSOR) 50 MG tablet TAKE 1 TABLET BY MOUTH TWICE A DAY  . montelukast (SINGULAIR) 10 MG tablet TAKE 1 TABLET BY MOUTH DAILY FOR ASTHMA  . nystatin (NYSTATIN) powder Apply 1 application topically in the morning, at noon, in the evening, and at bedtime.  . ondansetron (ZOFRAN ODT) 4 MG disintegrating tablet Allow 1-2 tablets to dissolve in your mouth every 8 hours as needed for nausea/vomiting  . oxymetazoline (AFRIN) 0.05 % nasal spray Place 1 spray into both nostrils 2 (two) times daily as needed for congestion.  . pantoprazole (PROTONIX) 40 MG tablet Take 1 tablet (40 mg total) by mouth 2 (two) times daily.  . polyethylene glycol (MIRALAX / GLYCOLAX) 17 g packet Take 17 g by mouth daily.  . predniSONE (DELTASONE) 10 MG tablet Take 1 tablet three times daily with a meal for 2 days followed by 1 tablet two days for 2 days. Then resume 10 mg daily as prescribed.  . SYMBICORT 160-4.5 MCG/ACT inhaler INHALE 2 PUFF TWICE A DAY  . traMADol (ULTRAM) 50 MG tablet Take 1 tablet (50 mg total) by mouth every 8 (eight) hours as needed for moderate pain.  Marland Kitchen tretinoin (RETIN-A) 0.025 % cream Apply 1 application topically at bedtime as needed (for eczema on face).   Marland Kitchen VICTOZA 18 MG/3ML SOPN INJECT 1.8 MG UNDER THE SKIN ONCE DAILY IN THE MORNING  . warfarin (COUMADIN) 7.5 MG tablet Take 7.5 mg by mouth daily. Take 1 tab po daily except Saturday and Sunday take 2 tab po daily  . Fluticasone-Umeclidin-Vilant (TRELEGY ELLIPTA) 100-62.5-25 MCG/INH AEPB Inhale 1 puff into the lungs daily.   No facility-administered encounter medications on file as of 03/04/2020.    Surgical History: Past Surgical History:  Procedure Laterality Date  . APPENDECTOMY    . COLONOSCOPY WITH PROPOFOL N/A 02/02/2018   Procedure: COLONOSCOPY WITH PROPOFOL;  Surgeon: Jonathon Bellows, MD;  Location: [facility] ENDOSCOPY;  Service: Gastroenterology;  Laterality: N/A;  . ESOPHAGOGASTRODUODENOSCOPY (EGD) WITH PROPOFOL  N/A 02/08/2020   Procedure: ESOPHAGOGASTRODUODENOSCOPY (EGD) WITH PROPOFOL;  Surgeon: Jonathon Bellows, MD;  Location: [facility] ENDOSCOPY;  Service: Gastroenterology;  Laterality: N/A;  . LAPAROSCOPIC APPENDECTOMY N/A 02/05/2018   Procedure: APPENDECTOMY LAPAROSCOPIC;  Surgeon: Jules Husbands, MD;  Location: [facility] ORS;  Service: General;  Laterality: N/A;  . right arm surgery    . TUBAL LIGATION      Medical History: Past Medical History:  Diagnosis Date  . Asthma   . Atopic dermatitis   . Collagen vascular disease (HCC)    Rhematoid Arthritis  . Constipation   . COPD (chronic obstructive pulmonary disease) ([place])   . Diabetes mellitus without complication ([place])   . DVT (deep venous thrombosis) ([place])   . GERD (gastroesophageal reflux disease)   . Hyperlipidemia   . Hypertension   . Rheumatoid arthritis ([place])  Family History: Family History  Problem Relation Age of Onset  . Breast cancer Mother   . Hypertension Mother   . Diabetes Mother   . Hypertension Father   . Heart failure Father     Social History   Socioeconomic History  . Marital status: Single    Spouse name: Not on file  . Number of children: Not on file  . Years of education: Not on file  . Highest education level: Not on file  Occupational History  . Not on file  Tobacco Use  . Smoking status: Former Research scientist (life sciences)  . Smokeless tobacco: Never Used  Vaping Use  . Vaping Use: Never used  Substance and Sexual Activity  . Alcohol use: No  . Drug use: No  . Sexual activity: Yes  Other Topics Concern  . Not on file  Social History Narrative  . Not on file   Social Determinants of Health   Financial Resource Strain:   . Difficulty of Paying Living Expenses: Not on file  Food Insecurity:   . Worried About Charity fundraiser in the Last Year: Not on file  . Ran Out of Food in the Last Year: Not on file  Transportation Needs:   . Lack of Transportation (Medical): Not on file  . Lack of Transportation  (Non-Medical): Not on file  Physical Activity:   . Days of Exercise per Week: Not on file  . Minutes of Exercise per Session: Not on file  Stress:   . Feeling of Stress : Not on file  Social Connections:   . Frequency of Communication with Friends and Family: Not on file  . Frequency of Social Gatherings with Friends and Family: Not on file  . Attends Religious Services: Not on file  . Active Member of Clubs or Organizations: Not on file  . Attends Archivist Meetings: Not on file  . Marital Status: Not on file  Intimate Partner Violence:   . Fear of Current or Ex-Partner: Not on file  . Emotionally Abused: Not on file  . Physically Abused: Not on file  . Sexually Abused: Not on file      Review of Systems  Constitutional: Negative for chills, diaphoresis and fatigue.  HENT: Negative for ear pain, postnasal drip and sinus pressure.   Eyes: Negative for photophobia, discharge, redness, itching and visual disturbance.  Respiratory: Positive for wheezing. Negative for cough and shortness of breath.   Cardiovascular: Negative for chest pain, palpitations and leg swelling.  Gastrointestinal: Negative for abdominal pain, constipation, diarrhea, nausea and vomiting.  Genitourinary: Negative for dysuria and flank pain.  Musculoskeletal: Positive for arthralgias and back pain. Negative for gait problem and neck pain.  Skin: Negative for color change.  Allergic/Immunologic: Negative for environmental allergies and food allergies.  Neurological: Negative for dizziness and headaches.  Hematological: Does not bruise/bleed easily.  Psychiatric/Behavioral: Negative for agitation, behavioral problems (depression) and hallucinations.    Vital Signs: BP 130/80   Pulse 78   Temp 98 F (36.7 C)   Resp 16   Ht 5\' 6"  (1.676 m)   Wt 269 lb (122 kg)   SpO2 98%   BMI 43.42 kg/m    Physical Exam Vitals reviewed.  Constitutional:      Appearance: Normal appearance. She is obese.   Cardiovascular:     Rate and Rhythm: Normal rate and regular rhythm.     Pulses: Normal pulses.     Heart sounds: Normal heart sounds.  Pulmonary:  Effort: Pulmonary effort is normal.     Breath sounds: Examination of the right-upper field reveals wheezing. Examination of the left-upper field reveals wheezing. Examination of the right-middle field reveals wheezing. Examination of the left-middle field reveals wheezing. Examination of the right-lower field reveals wheezing. Examination of the left-lower field reveals wheezing. Wheezing present.  Abdominal:     General: Abdomen is flat.     Palpations: Abdomen is soft.  Musculoskeletal:        General: Normal range of motion.     Cervical back: Normal range of motion.  Skin:    General: Skin is warm.  Neurological:     General: No focal deficit present.     Mental Status: She is alert and oriented to person, place, and time. Mental status is at baseline.  Psychiatric:        Mood and Affect: Mood normal.        Behavior: Behavior normal.        Thought Content: Thought content normal.    Assessment/Plan: 1. Chronic obstructive pulmonary disease, unspecified COPD type ([place]) Audible wheezing, will send for CXR to assess possible acute infection, fluid Stop Breztri, start Trelegy--advised to use everyday, samples given in office today and script sent - DG Chest 2 View; Future - Fluticasone-Umeclidin-Vilant (TRELEGY ELLIPTA) 100-62.5-25 MCG/INH AEPB; Inhale 1 puff into the lungs daily.  Dispense: 28 each; Refill: 0  2. PAH (pulmonary artery hypertension) ([place]) Will need further evaluation, will wait until acute symptoms have resolved  3. Chronic bilateral low back pain with bilateral sciatica Continue with current therapy at this time---evaluate PDMP, multiple provider prescribing narcotics RA? Rheumatology?  4. Morbid obesity (HCC) BMI 43  5. Obesity, Class III, BMI 40-49.9 (morbid obesity) ([place]) Discussed the importance of  weight management through healthy eating and daily exercise as tolerated. Discussed the negative effects obesity has on pulmonary health, cardiac health as well as overall general health and well being.  6. Long term (current) use of anticoagulants INR 2.2 today, continue with current coumadin schedule - POCT INR  General Counseling: Stacie verbalizes understanding of the findings of todays visit and agrees with plan of treatment. I have discussed any further diagnostic evaluation that may be needed or ordered today. We also reviewed her medications today. she has been encouraged to call the office with any questions or concerns that should arise related to todays visit.    Orders Placed This Encounter  Procedures  . DG Chest 2 View  . POCT INR    Meds ordered this encounter  Medications  . Fluticasone-Umeclidin-Vilant (TRELEGY ELLIPTA) 100-62.5-25 MCG/INH AEPB    Sig: Inhale 1 puff into the lungs daily.    Dispense:  28 each    Refill:  0    Time spent: 30 Minutes Time spent includes review of chart, medications, test results and follow-up plan with the patient.  This patient was seen by Theodoro Grist AGNP-C in Collaboration with Dr Lavera Guise as a part of collaborative care agreement     Tanna Furry. Ketura Sirek AGNP-C Internal medicine

## 2020-03-06 IMAGING — RF DG PERITONEOGRAM/HERNIOGRAM
6 series · 11 of 11 positions shown · non-contrast
Comparison: None.

CLINICAL DATA: Ruptured appendicitis. Drainage catheter was
present. Evaluate for colocutaneous fistula.

EXAM:
PERITONEOGRAM/HERNIOGRAM

[Series 1: fluoro_iodine 2fps_bw · 0.17mm/px · 1 of 1 slices shown (1 of 5)]
[im 1/1]
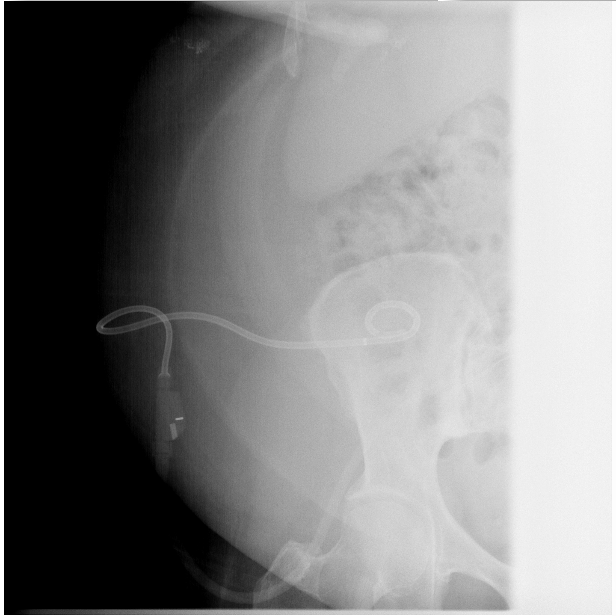

[Series 2: cp_standard · 0.26mm/px · 1 of 1 slices shown]
[im 1/1]
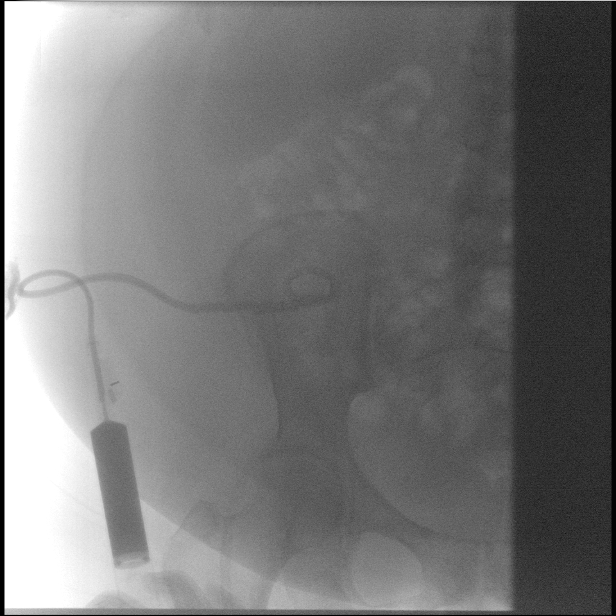

[Series 3: fluoro_iodine 2fps_bw · 0.17mm/px · 2 of 2 frames shown (2 of 5)]
[frame 1/2]
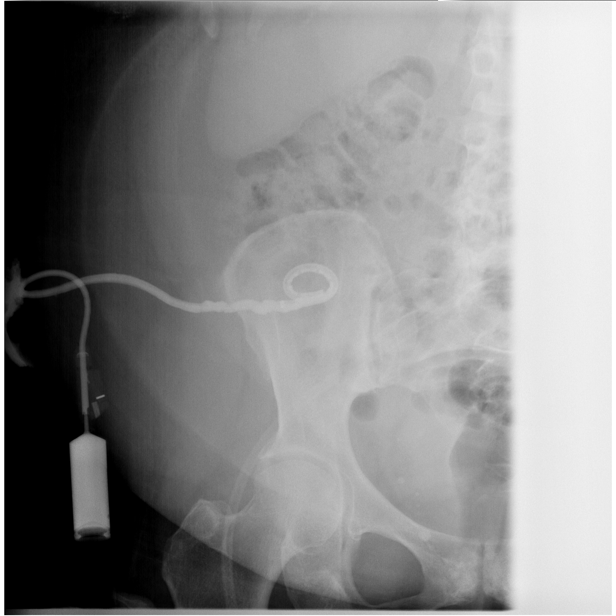
[frame 2/2]
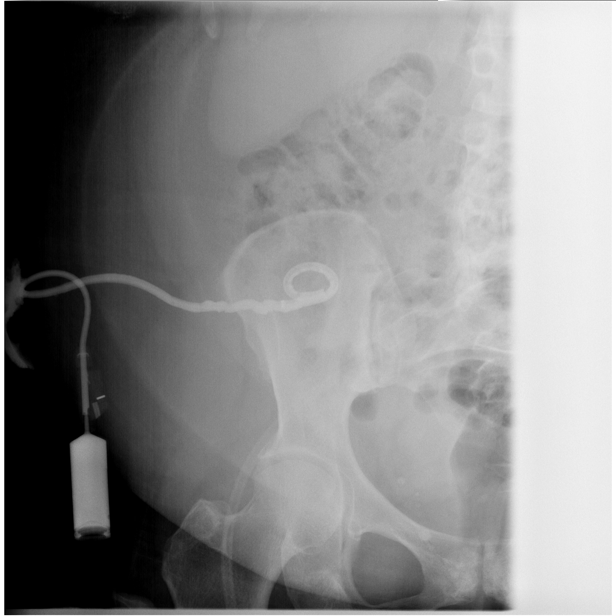

[Series 4: fluoro_iodine 2fps_bw · 0.17mm/px · 3 of 5 frames shown (3 of 5)]
[frame 1/5]
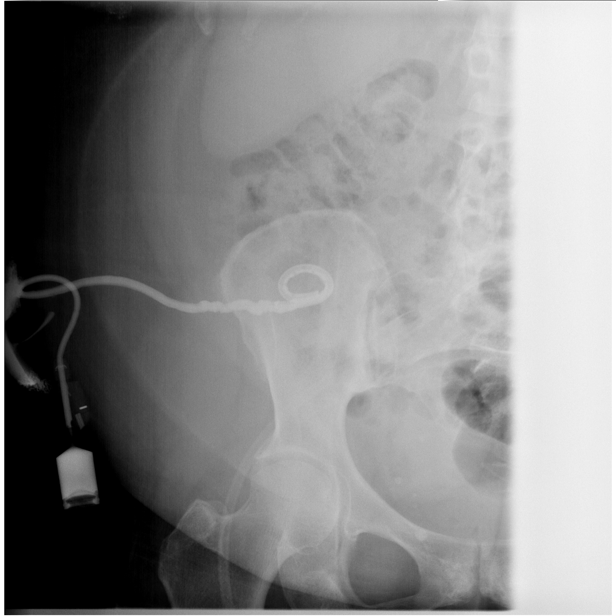
[frame 3/5]
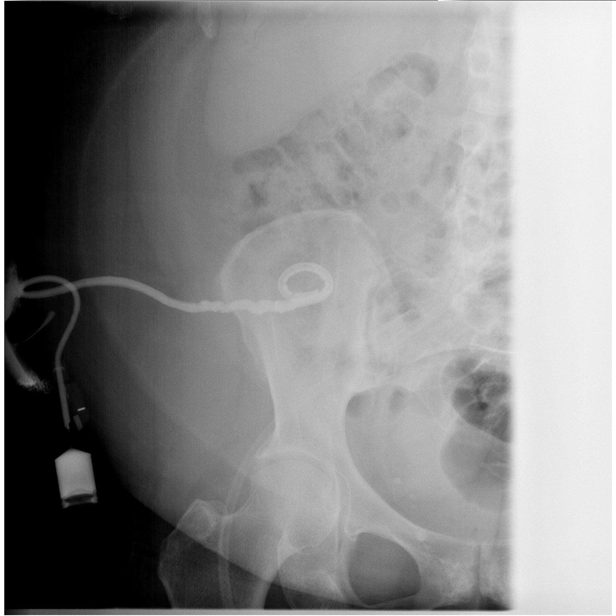
[frame 5/5]
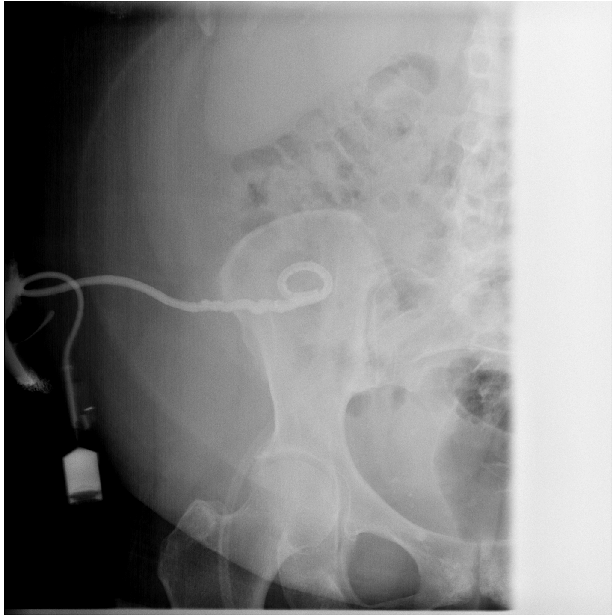

[Series 5: fluoro_iodine 2fps_bw · 0.17mm/px · 1 of 1 slices shown (4 of 5)]
[im 1/1]
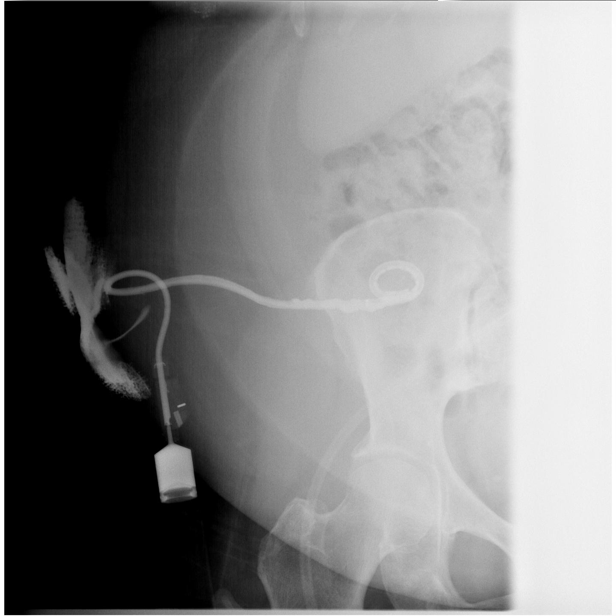

[Series 6: fluoro_iodine 2fps_bw · 0.17mm/px · 3 of 3 frames shown (5 of 5)]
[frame 1/3]
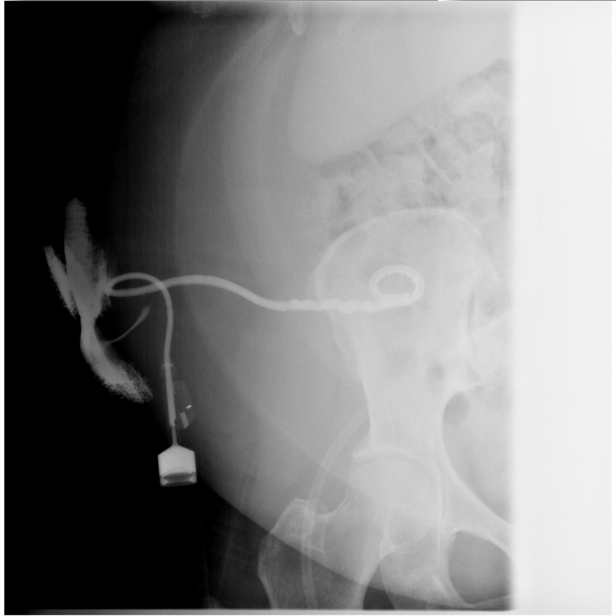
[frame 2/3]
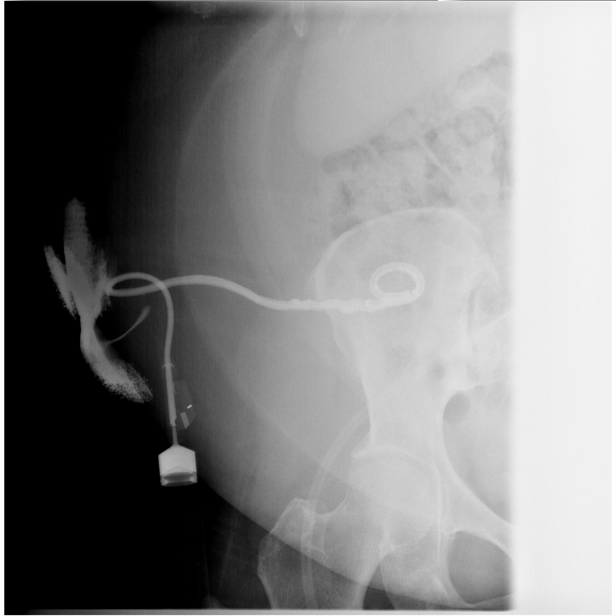
[frame 3/3]
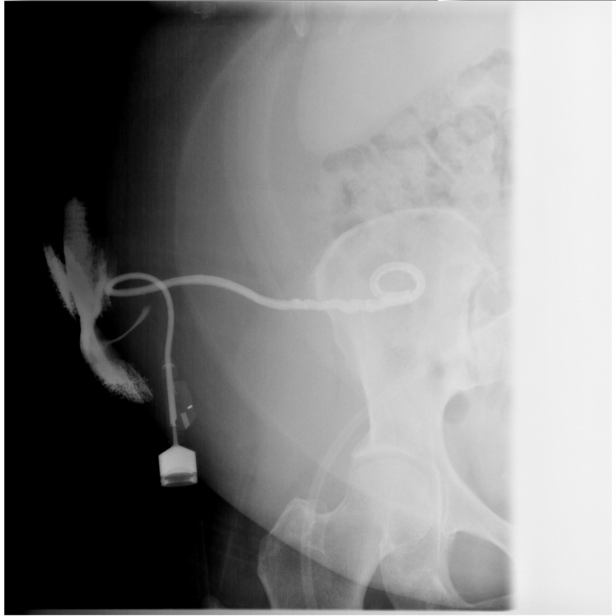

[11 of 11 positions shown; findings below may reference images not displayed]

FINDINGS: Real-time fluoroscopy was performed of the right lower quadrant at
the site of the pigtail catheter. 20 mL Isovue 300 contrast was hand
injected through the pigtail catheter. No contrast pools around the
pigtail catheter within the peritoneal cavity. No contrast is seen
within the colon. Contrast traverses back around the catheter to the
skin surface.
IMPRESSION: 1. Right lower quadrant pigtail catheter. No contrast filling a
cavity around the pigtail catheter. No colocutaneous fistula
observed.

## 2020-03-09 ENCOUNTER — Encounter: Payer: Self-pay | Admitting: Hospice and Palliative Medicine

## 2020-03-10 ENCOUNTER — Other Ambulatory Visit: Payer: Self-pay

## 2020-03-10 ENCOUNTER — Encounter: Payer: Self-pay | Admitting: Internal Medicine

## 2020-03-10 ENCOUNTER — Ambulatory Visit (INDEPENDENT_AMBULATORY_CARE_PROVIDER_SITE_OTHER): Payer: Medicaid Other | Admitting: Internal Medicine

## 2020-03-10 VITALS — BP 136/70 | HR 82 | Temp 97.5°F | Resp 16 | Ht 66.0 in | Wt 269.0 lb

## 2020-03-10 DIAGNOSIS — E1165 Type 2 diabetes mellitus with hyperglycemia: Secondary | ICD-10-CM

## 2020-03-10 DIAGNOSIS — Z7901 Long term (current) use of anticoagulants: Secondary | ICD-10-CM | POA: Diagnosis not present

## 2020-03-10 DIAGNOSIS — Z86718 Personal history of other venous thrombosis and embolism: Secondary | ICD-10-CM

## 2020-03-10 DIAGNOSIS — D649 Anemia, unspecified: Secondary | ICD-10-CM | POA: Diagnosis not present

## 2020-03-10 DIAGNOSIS — J440 Chronic obstructive pulmonary disease with acute lower respiratory infection: Secondary | ICD-10-CM

## 2020-03-10 DIAGNOSIS — J209 Acute bronchitis, unspecified: Secondary | ICD-10-CM

## 2020-03-10 DIAGNOSIS — K279 Peptic ulcer, site unspecified, unspecified as acute or chronic, without hemorrhage or perforation: Secondary | ICD-10-CM

## 2020-03-10 LAB — BASIC METABOLIC PANEL
BUN/Creatinine Ratio: 20 (ref 9–23)
BUN: 23 mg/dL (ref 6–24)
CO2: 20 mmol/L (ref 20–29)
Calcium: 9.3 mg/dL (ref 8.7–10.2)
Chloride: 103 mmol/L (ref 96–106)
Creatinine, Ser: 1.15 mg/dL — ABNORMAL HIGH (ref 0.57–1.00)
GFR calc Af Amer: 60 mL/min/{1.73_m2} (ref >59)
GFR calc non Af Amer: 52 mL/min/{1.73_m2} — ABNORMAL LOW (ref >59)
Glucose: 249 mg/dL — ABNORMAL HIGH (ref 65–99)
Potassium: 4.3 mmol/L (ref 3.5–5.2)
Sodium: 140 mmol/L (ref 134–144)

## 2020-03-10 LAB — POCT INR: INR: 4.1 — AB (ref 2.0–3.0)

## 2020-03-10 LAB — POCT CBG (FASTING - GLUCOSE)-MANUAL ENTRY: Glucose Fasting, POC: 208 mg/dL — AB (ref 70–99)

## 2020-03-10 MED ORDER — DAPAGLIFLOZIN PROPANEDIOL 10 MG PO TABS: ORAL_TABLET | ORAL | 3 refills | Status: DC

## 2020-03-10 MED ORDER — PANTOPRAZOLE SODIUM 40 MG PO TBEC: 40.0000 mg | DELAYED_RELEASE_TABLET | Freq: Two times a day (BID) | ORAL | 1 refills | Status: DC

## 2020-03-10 MED ORDER — TRELEGY ELLIPTA 100-62.5-25 MCG/INH IN AEPB: INHALATION_SPRAY | RESPIRATORY_TRACT | 6 refills | Status: DC

## 2020-03-10 NOTE — Progress Notes (Signed)
[facility] [place], [place] [address]  Internal MEDICINE  Office Visit Note  Patient Name: Brenda Santana  [id number]  [id number]  Date of Service: 03/10/2020  Chief Complaint  Patient presents with  . Follow-up    refill request, weight management  . Diabetes  . Gastroesophageal Reflux  . Hyperlipidemia  . Hypertension  . CONTROLLED SUBSTANCE FORM    Reviewed with PT    Pt is here for routine follow up She was started on Farxiga 5 mg po qd along with Trulicity. Diabetic numbers are still not better. GI bleeding due to NSAID, she is also on Coumadin, now on Protonix bid, feels tired, hg has been low  Chronic bilateral knee pain followed by ortho Will like refill on Trelegy since it has worked well for her Obesity hypoventilation syndrome, will like her to use her CPAP, might need overnight pox  Current Medication: Outpatient Encounter Medications as of 03/10/2020  Medication Sig  . Accu-Chek FastClix Lancets MISC Use as directed once a daily diag E11.9  . acetaminophen-codeine (TYLENOL #3) 300-30 MG tablet Take one tab po bid for chronic pain  . acyclovir (ZOVIRAX) 400 MG tablet TAKE 1 TABLET BY MOUTH TWICE A DAY (Patient taking differently: Take 400 mg by mouth 2 (two) times daily. )  . acyclovir ointment (ZOVIRAX) 5 % APPLY TOPICALLY EVERY 3 (THREE) HOURS.  Marland Kitchen albuterol (VENTOLIN HFA) 108 (90 Base) MCG/ACT inhaler INHALE 2 PUFFS INTO THE LUNGS EVERY 4 (FOUR) HOURS AS NEEDED FOR WHEEZING OR SHORTNESS OF BREATH.  Marland Kitchen allopurinol (ZYLOPRIM) 300 MG tablet TAKE ONE TAB AT NIGHT FOR GOUT (Patient taking differently: Take 300 mg by mouth at bedtime. )  . aspirin EC 81 MG tablet Take 81 mg by mouth daily.  Marland Kitchen atorvastatin (LIPITOR) 10 MG tablet TAKE 1 TABLET BY MOUTH EVERY DAY (Patient taking differently: Take 10 mg by mouth daily. )  . baclofen (LIORESAL) 10 MG tablet Take 1 tablet (10 mg total) by mouth 3 (three) times daily.  . BD PEN NEEDLE NANO U/F 32G X 4  MM MISC USE DAILY WITH VICTOZA  . Biotin 5 MG CAPS Take 5 mg by mouth daily.   . Budeson-Glycopyrrol-Formoterol (BREZTRI AEROSPHERE) 160-9-4.8 MCG/ACT AERO Inhale 2 puffs into the lungs in the morning and at bedtime.  . cetirizine (ZYRTEC ALLERGY) 10 MG tablet Take 1 tablet (10 mg total) by mouth daily as needed for allergies.  . clobetasol cream (TEMOVATE) 2.54 % Apply 1 application topically 2 (two) times daily as needed (for eczema on hands).   . dapagliflozin propanediol (FARXIGA) 10 MG TABS tablet Take one tab po qd for dm  . diclofenac sodium (VOLTAREN) 1 % GEL APPLY 4 G TOPICALLY FOUR (4) TIMES A DAY.  Marland Kitchen docusate sodium (COLACE) 100 MG capsule Take 100 mg by mouth daily.   Marland Kitchen doxycycline (VIBRA-TABS) 100 MG tablet TAKE 1 TABLET BY MOUTH TWICE A DAY (Patient taking differently: Take 100 mg by mouth 2 (two) times daily. )  . Ferrous Sulfate (IRON) 325 (65 Fe) MG TABS Take 325 mg by mouth 3 (three) times daily.   . Fluticasone-Umeclidin-Vilant (TRELEGY ELLIPTA) 100-62.5-25 MCG/INH AEPB Take one inhalation a day  . furosemide (LASIX) 20 MG tablet Take 1 tablet (20 mg total) by mouth daily.  Marland Kitchen gabapentin (NEURONTIN) 800 MG tablet Take 1 tablet (800 mg total) by mouth 3 (three) times daily.  Marland Kitchen glucose blood (ACCU-CHEK GUIDE) test strip 1 each by Other route daily. Use as  instructed  Once daily diag e11.9  . ipratropium-albuterol (DUONEB) 0.5-2.5 (3) MG/3ML SOLN Take 3 mLs by nebulization every 6 (six) hours as needed (for shortness of breath or wheezing).   Marland Kitchen lidocaine (XYLOCAINE) 5 % ointment APPLY A SMALL AMOUNT TO AFFECTED AREA 2-3 TIMES A DAY  . LINZESS 145 MCG CAPS capsule TAKE 1 CAPSULE BY MOUTH EVERY DAY BEFORE BREAKFAST (Patient taking differently: Take 145 mcg by mouth daily before breakfast. )  . lisinopril (ZESTRIL) 10 MG tablet TAKE 1 TAB DAILY IN MORNING  . metoprolol tartrate (LOPRESSOR) 50 MG tablet TAKE 1 TABLET BY MOUTH TWICE A DAY  . montelukast (SINGULAIR) 10 MG tablet TAKE 1  TABLET BY MOUTH DAILY FOR ASTHMA  . ondansetron (ZOFRAN ODT) 4 MG disintegrating tablet Allow 1-2 tablets to dissolve in your mouth every 8 hours as needed for nausea/vomiting  . oxymetazoline (AFRIN) 0.05 % nasal spray Place 1 spray into both nostrils 2 (two) times daily as needed for congestion.  . pantoprazole (PROTONIX) 40 MG tablet Take 1 tablet (40 mg total) by mouth 2 (two) times daily.  . polyethylene glycol (MIRALAX / GLYCOLAX) 17 g packet Take 17 g by mouth daily.  . predniSONE (DELTASONE) 10 MG tablet Take 1 tablet three times daily with a meal for 2 days followed by 1 tablet two days for 2 days. Then resume 10 mg daily as prescribed.  . traMADol (ULTRAM) 50 MG tablet Take 1 tablet (50 mg total) by mouth every 8 (eight) hours as needed for moderate pain.  Marland Kitchen tretinoin (RETIN-A) 0.025 % cream Apply 1 application topically at bedtime as needed (for eczema on face).   Marland Kitchen VICTOZA 18 MG/3ML SOPN INJECT 1.8 MG UNDER THE SKIN ONCE DAILY IN THE MORNING  . warfarin (COUMADIN) 7.5 MG tablet Take 7.5 mg by mouth daily. Take 1 tab po daily except Saturday and Sunday take 2 tab po daily  . [DISCONTINUED] Fluticasone-Umeclidin-Vilant (TRELEGY ELLIPTA) 100-62.5-25 MCG/INH AEPB Inhale 1 puff into the lungs daily.  . [DISCONTINUED] nystatin (NYSTATIN) powder Apply 1 application topically in the morning, at noon, in the evening, and at bedtime.  . [DISCONTINUED] pantoprazole (PROTONIX) 40 MG tablet Take 1 tablet (40 mg total) by mouth 2 (two) times daily.  . [DISCONTINUED] SYMBICORT 160-4.5 MCG/ACT inhaler INHALE 2 PUFF TWICE A DAY   No facility-administered encounter medications on file as of 03/10/2020.    Surgical History: Past Surgical History:  Procedure Laterality Date  . APPENDECTOMY    . COLONOSCOPY WITH PROPOFOL N/A 02/02/2018   Procedure: COLONOSCOPY WITH PROPOFOL;  Surgeon: Jonathon Bellows, MD;  Location: [facility] ENDOSCOPY;  Service: Gastroenterology;  Laterality: N/A;  . ESOPHAGOGASTRODUODENOSCOPY  (EGD) WITH PROPOFOL N/A 02/08/2020   Procedure: ESOPHAGOGASTRODUODENOSCOPY (EGD) WITH PROPOFOL;  Surgeon: Jonathon Bellows, MD;  Location: [facility] ENDOSCOPY;  Service: Gastroenterology;  Laterality: N/A;  . LAPAROSCOPIC APPENDECTOMY N/A 02/05/2018   Procedure: APPENDECTOMY LAPAROSCOPIC;  Surgeon: Jules Husbands, MD;  Location: [facility] ORS;  Service: General;  Laterality: N/A;  . right arm surgery    . TUBAL LIGATION      Medical History: Past Medical History:  Diagnosis Date  . Asthma   . Atopic dermatitis   . Collagen vascular disease (HCC)    Rhematoid Arthritis  . Constipation   . COPD (chronic obstructive pulmonary disease) ([place])   . Diabetes mellitus without complication ([place])   . DVT (deep venous thrombosis) ([place])   . GERD (gastroesophageal reflux disease)   . Hyperlipidemia   . Hypertension   .  Rheumatoid arthritis ([place])     Family History: Family History  Problem Relation Age of Onset  . Breast cancer Mother   . Hypertension Mother   . Diabetes Mother   . Hypertension Father   . Heart failure Father     Social History   Socioeconomic History  . Marital status: Single    Spouse name: Not on file  . Number of children: Not on file  . Years of education: Not on file  . Highest education level: Not on file  Occupational History  . Not on file  Tobacco Use  . Smoking status: Former Research scientist (life sciences)  . Smokeless tobacco: Never Used  Vaping Use  . Vaping Use: Never used  Substance and Sexual Activity  . Alcohol use: No  . Drug use: No  . Sexual activity: Yes  Other Topics Concern  . Not on file  Social History Narrative  . Not on file   Social Determinants of Health   Financial Resource Strain:   . Difficulty of Paying Living Expenses: Not on file  Food Insecurity:   . Worried About Charity fundraiser in the Last Year: Not on file  . Ran Out of Food in the Last Year: Not on file  Transportation Needs:   . Lack of Transportation (Medical): Not on file  . Lack of  Transportation (Non-Medical): Not on file  Physical Activity:   . Days of Exercise per Week: Not on file  . Minutes of Exercise per Session: Not on file  Stress:   . Feeling of Stress : Not on file  Social Connections:   . Frequency of Communication with Friends and Family: Not on file  . Frequency of Social Gatherings with Friends and Family: Not on file  . Attends Religious Services: Not on file  . Active Member of Clubs or Organizations: Not on file  . Attends Archivist Meetings: Not on file  . Marital Status: Not on file  Intimate Partner Violence:   . Fear of Current or Ex-Partner: Not on file  . Emotionally Abused: Not on file  . Physically Abused: Not on file  . Sexually Abused: Not on file      Review of Systems  Constitutional: Negative for chills, diaphoresis and fatigue.  HENT: Negative for ear pain, postnasal drip and sinus pressure.   Eyes: Negative for photophobia, discharge, redness, itching and visual disturbance.  Respiratory: Negative for cough, shortness of breath and wheezing.   Cardiovascular: Negative for chest pain, palpitations and leg swelling.  Gastrointestinal: Negative for abdominal pain, constipation, diarrhea, nausea and vomiting.  Genitourinary: Negative for dysuria and flank pain.  Musculoskeletal: Negative for arthralgias, back pain, gait problem and neck pain.  Skin: Negative for color change.  Allergic/Immunologic: Negative for environmental allergies and food allergies.  Neurological: Negative for dizziness and headaches.  Hematological: Does not bruise/bleed easily.  Psychiatric/Behavioral: Negative for agitation, behavioral problems (depression) and hallucinations.    Vital Signs: BP 136/70   Pulse 82   Temp (!) 97.5 F (36.4 C)   Resp 16   Ht 5\' 6"  (1.676 m)   Wt 269 lb (122 kg)   SpO2 98%   BMI 43.42 kg/m   Physical Exam Constitutional:      General: She is not in acute distress.    Appearance: She is  well-developed. She is not diaphoretic.  HENT:     Head: Normocephalic and atraumatic.     Mouth/Throat:     Pharynx: No oropharyngeal  exudate.  Eyes:     Pupils: Pupils are equal, round, and reactive to light.  Neck:     Thyroid: No thyromegaly.     Vascular: No JVD.     Trachea: No tracheal deviation.  Cardiovascular:     Rate and Rhythm: Normal rate and regular rhythm.     Heart sounds: Normal heart sounds. No murmur heard.  No friction rub. No gallop.   Pulmonary:     Effort: Pulmonary effort is normal. No respiratory distress.     Breath sounds: No wheezing or rales.     Comments: Reduced air entry bilaterally  Chest:     Chest wall: No tenderness.  Abdominal:     General: Bowel sounds are normal.     Palpations: Abdomen is soft.  Musculoskeletal:        General: Normal range of motion.     Cervical back: Normal range of motion and neck supple.  Lymphadenopathy:     Cervical: No cervical adenopathy.  Skin:    General: Skin is warm and dry.  Neurological:     Mental Status: She is alert and oriented to person, place, and time.     Cranial Nerves: No cranial nerve deficit.  Psychiatric:        Behavior: Behavior normal.        Thought Content: Thought content normal.        Judgment: Judgment normal.      Assessment/Plan: 1. Anemia, unspecified type Pt continues to be fatigued, repeat CBC - CBC with Differential/Platelet - Ferritin; Future - POCT INR  2. Long term (current) use of anticoagulants Hold coumadin for now, decrease to take one tab a day, might want to look into NOAC if pt is willing to use her O2 and CPAP - POCT INR  3. Uncontrolled type 2 diabetes mellitus with hyperglycemia (HCC) Continue Trulicity and increase Farxiga 10 mg po qd  - POCT CBG (Fasting - Glucose)  4. Peptic ulcer disease Avoid all NSAIDS, monitor PT/INR carefully  - pantoprazole (PROTONIX) 40 MG tablet; Take 1 tablet (40 mg total) by mouth 2 (two) times daily.  Dispense: 180  tablet; Refill: 1  5. Acute bronchitis with COPD ([place]) Improving  - Fluticasone-Umeclidin-Vilant (TRELEGY ELLIPTA) 100-62.5-25 MCG/INH AEPB; Take one inhalation a day  Dispense: 30 each; Refill: 6  General Counseling: Lois verbalizes understanding of the findings of todays visit and agrees with plan of treatment. I have discussed any further diagnostic evaluation that may be needed or ordered today. We also reviewed her medications today. she has been encouraged to call the office with any questions or concerns that should arise related to todays visit.    Orders Placed This Encounter  Procedures  . CBC with Differential/Platelet  . Ferritin  . POCT INR  . POCT CBG (Fasting - Glucose)    Meds ordered this encounter  Medications  . pantoprazole (PROTONIX) 40 MG tablet    Sig: Take 1 tablet (40 mg total) by mouth 2 (two) times daily.    Dispense:  180 tablet    Refill:  1  . Fluticasone-Umeclidin-Vilant (TRELEGY ELLIPTA) 100-62.5-25 MCG/INH AEPB    Sig: Take one inhalation a day    Dispense:  30 each    Refill:  6  . dapagliflozin propanediol (FARXIGA) 10 MG TABS tablet    Sig: Take one tab po qd for dm    Dispense:  90 tablet    Refill:  3    Total time spent:  35Minutes Time spent includes review of chart, medications, test results, and follow up plan with the patient.      Dr Lavera Guise Internal medicine

## 2020-03-11 LAB — CBC WITH DIFFERENTIAL/PLATELET
Basophils Absolute: 0 10*3/uL (ref 0.0–0.2)
Basos: 0 %
EOS (ABSOLUTE): 0.1 10*3/uL (ref 0.0–0.4)
Eos: 1 %
Hematocrit: 37.5 % (ref 34.0–46.6)
Hemoglobin: 12 g/dL (ref 11.1–15.9)
Immature Grans (Abs): 0 10*3/uL (ref 0.0–0.1)
Immature Granulocytes: 0 %
Lymphocytes Absolute: 0.9 10*3/uL (ref 0.7–3.1)
Lymphs: 8 %
MCH: 31.3 pg (ref 26.6–33.0)
MCHC: 32 g/dL (ref 31.5–35.7)
MCV: 98 fL — ABNORMAL HIGH (ref 79–97)
Monocytes Absolute: 0.5 10*3/uL (ref 0.1–0.9)
Monocytes: 4 %
Neutrophils Absolute: 9.9 10*3/uL — ABNORMAL HIGH (ref 1.4–7.0)
Neutrophils: 87 %
Platelets: 364 10*3/uL (ref 150–450)
RBC: 3.83 x10E6/uL (ref 3.77–5.28)
RDW: 16 % — ABNORMAL HIGH (ref 11.7–15.4)
WBC: 11.5 10*3/uL — ABNORMAL HIGH (ref 3.4–10.8)

## 2020-03-17 ENCOUNTER — Encounter: Payer: Self-pay | Admitting: Internal Medicine

## 2020-03-17 ENCOUNTER — Other Ambulatory Visit: Payer: Self-pay

## 2020-03-17 ENCOUNTER — Ambulatory Visit (INDEPENDENT_AMBULATORY_CARE_PROVIDER_SITE_OTHER): Payer: Medicaid Other | Admitting: Internal Medicine

## 2020-03-17 DIAGNOSIS — E1165 Type 2 diabetes mellitus with hyperglycemia: Secondary | ICD-10-CM | POA: Diagnosis not present

## 2020-03-17 DIAGNOSIS — Z86718 Personal history of other venous thrombosis and embolism: Secondary | ICD-10-CM

## 2020-03-17 DIAGNOSIS — Z7901 Long term (current) use of anticoagulants: Secondary | ICD-10-CM

## 2020-03-17 DIAGNOSIS — K5904 Chronic idiopathic constipation: Secondary | ICD-10-CM | POA: Diagnosis not present

## 2020-03-17 DIAGNOSIS — G8929 Other chronic pain: Secondary | ICD-10-CM

## 2020-03-17 DIAGNOSIS — D500 Iron deficiency anemia secondary to blood loss (chronic): Secondary | ICD-10-CM | POA: Diagnosis not present

## 2020-03-17 DIAGNOSIS — M5441 Lumbago with sciatica, right side: Secondary | ICD-10-CM

## 2020-03-17 DIAGNOSIS — M5442 Lumbago with sciatica, left side: Secondary | ICD-10-CM

## 2020-03-17 LAB — POCT INR
INR: 2.1 (ref 2.0–3.0)
Prothrombin Time: 25.5

## 2020-03-17 MED ORDER — POLYETHYLENE GLYCOL 3350 17 G PO PACK: PACK | ORAL | 3 refills | Status: DC

## 2020-03-17 MED ORDER — ACETAMINOPHEN-CODEINE #3 300-30 MG PO TABS: ORAL_TABLET | ORAL | 0 refills | Status: DC

## 2020-03-17 NOTE — Progress Notes (Signed)
[facility] [place], [place] [id number]  Internal MEDICINE  Office Visit Note  Patient Name: Brenda Santana  [id number]  [id number]  Date of Service: 03/17/2020  Chief Complaint  Patient presents with  . Follow-up    discuss labs  . Depression  . Hyperlipidemia  . Hypertension  . Asthma  . COPD  . policy update form    received    HPI  Pt is here for routine follow up Recent GI bleed, hg is improved to 12.0, fatigue is better. She is on iron supplement. C/o constipation, Linzess is not helping her that much. Breathing is better after using Trelegy. Takes coumadin for DVT.  She didn't start her Wilder Glade yet. Was a bit confused about her medications. She does not use her CPAP. Chronic pain syndrome now on tylenol with codeine   Current Medication: Outpatient Encounter Medications as of 03/17/2020  Medication Sig  . Accu-Chek FastClix Lancets MISC Use as directed once a daily diag E11.9  . acetaminophen-codeine (TYLENOL #3) 300-30 MG tablet Take one tab po bid for chronic pain  . acyclovir (ZOVIRAX) 400 MG tablet TAKE 1 TABLET BY MOUTH TWICE A DAY (Patient taking differently: Take 400 mg by mouth 2 (two) times daily. )  . acyclovir ointment (ZOVIRAX) 5 % APPLY TOPICALLY EVERY 3 (THREE) HOURS.  Marland Kitchen albuterol (VENTOLIN HFA) 108 (90 Base) MCG/ACT inhaler INHALE 2 PUFFS INTO THE LUNGS EVERY 4 (FOUR) HOURS AS NEEDED FOR WHEEZING OR SHORTNESS OF BREATH.  Marland Kitchen allopurinol (ZYLOPRIM) 300 MG tablet TAKE ONE TAB AT NIGHT FOR GOUT (Patient taking differently: Take 300 mg by mouth at bedtime. )  . aspirin EC 81 MG tablet Take 81 mg by mouth daily.  Marland Kitchen atorvastatin (LIPITOR) 10 MG tablet TAKE 1 TABLET BY MOUTH EVERY DAY (Patient taking differently: Take 10 mg by mouth daily. )  . baclofen (LIORESAL) 10 MG tablet Take 1 tablet (10 mg total) by mouth 3 (three) times daily.  . BD PEN NEEDLE NANO U/F 32G X 4 MM MISC USE DAILY WITH VICTOZA  . Biotin 5 MG CAPS Take 5 mg by  mouth daily.   . Budeson-Glycopyrrol-Formoterol (BREZTRI AEROSPHERE) 160-9-4.8 MCG/ACT AERO Inhale 2 puffs into the lungs in the morning and at bedtime.  . cetirizine (ZYRTEC ALLERGY) 10 MG tablet Take 1 tablet (10 mg total) by mouth daily as needed for allergies.  . clobetasol cream (TEMOVATE) 3.38 % Apply 1 application topically 2 (two) times daily as needed (for eczema on hands).   . dapagliflozin propanediol (FARXIGA) 10 MG TABS tablet Take one tab po qd for dm  . diclofenac sodium (VOLTAREN) 1 % GEL APPLY 4 G TOPICALLY FOUR (4) TIMES A DAY.  Marland Kitchen docusate sodium (COLACE) 100 MG capsule Take 100 mg by mouth daily.   Marland Kitchen doxycycline (VIBRA-TABS) 100 MG tablet TAKE 1 TABLET BY MOUTH TWICE A DAY (Patient taking differently: Take 100 mg by mouth 2 (two) times daily. )  . Ferrous Sulfate (IRON) 325 (65 Fe) MG TABS Take 325 mg by mouth 3 (three) times daily.   . Fluticasone-Umeclidin-Vilant (TRELEGY ELLIPTA) 100-62.5-25 MCG/INH AEPB Take one inhalation a day  . furosemide (LASIX) 20 MG tablet Take 1 tablet (20 mg total) by mouth daily.  Marland Kitchen gabapentin (NEURONTIN) 800 MG tablet Take 1 tablet (800 mg total) by mouth 3 (three) times daily.  Marland Kitchen glucose blood (ACCU-CHEK GUIDE) test strip 1 each by Other route daily. Use as instructed  Once daily diag e11.9  .  ipratropium-albuterol (DUONEB) 0.5-2.5 (3) MG/3ML SOLN Take 3 mLs by nebulization every 6 (six) hours as needed (for shortness of breath or wheezing).   Marland Kitchen lidocaine (XYLOCAINE) 5 % ointment APPLY A SMALL AMOUNT TO AFFECTED AREA 2-3 TIMES A DAY  . LINZESS 145 MCG CAPS capsule TAKE 1 CAPSULE BY MOUTH EVERY DAY BEFORE BREAKFAST (Patient taking differently: Take 145 mcg by mouth daily before breakfast. )  . lisinopril (ZESTRIL) 10 MG tablet TAKE 1 TAB DAILY IN MORNING  . metoprolol tartrate (LOPRESSOR) 50 MG tablet TAKE 1 TABLET BY MOUTH TWICE A DAY  . montelukast (SINGULAIR) 10 MG tablet TAKE 1 TABLET BY MOUTH DAILY FOR ASTHMA  . ondansetron (ZOFRAN ODT) 4 MG  disintegrating tablet Allow 1-2 tablets to dissolve in your mouth every 8 hours as needed for nausea/vomiting  . oxymetazoline (AFRIN) 0.05 % nasal spray Place 1 spray into both nostrils 2 (two) times daily as needed for congestion.  . pantoprazole (PROTONIX) 40 MG tablet Take 1 tablet (40 mg total) by mouth 2 (two) times daily.  . polyethylene glycol (MIRALAX / GLYCOLAX) 17 g packet Take one packets in one glass of water everyday  . predniSONE (DELTASONE) 10 MG tablet Take 1 tablet three times daily with a meal for 2 days followed by 1 tablet two days for 2 days. Then resume 10 mg daily as prescribed.  . traMADol (ULTRAM) 50 MG tablet Take 1 tablet (50 mg total) by mouth every 8 (eight) hours as needed for moderate pain.  Marland Kitchen tretinoin (RETIN-A) 0.025 % cream Apply 1 application topically at bedtime as needed (for eczema on face).   Marland Kitchen VICTOZA 18 MG/3ML SOPN INJECT 1.8 MG UNDER THE SKIN ONCE DAILY IN THE MORNING  . warfarin (COUMADIN) 7.5 MG tablet Take 7.5 mg by mouth daily. Take 1 tab po daily except Saturday and Sunday take 2 tab po daily  . [DISCONTINUED] polyethylene glycol (MIRALAX / GLYCOLAX) 17 g packet Take 17 g by mouth daily.   No facility-administered encounter medications on file as of 03/17/2020.    Surgical History: Past Surgical History:  Procedure Laterality Date  . APPENDECTOMY    . COLONOSCOPY WITH PROPOFOL N/A 02/02/2018   Procedure: COLONOSCOPY WITH PROPOFOL;  Surgeon: Jonathon Bellows, MD;  Location: [facility] ENDOSCOPY;  Service: Gastroenterology;  Laterality: N/A;  . ESOPHAGOGASTRODUODENOSCOPY (EGD) WITH PROPOFOL N/A 02/08/2020   Procedure: ESOPHAGOGASTRODUODENOSCOPY (EGD) WITH PROPOFOL;  Surgeon: Jonathon Bellows, MD;  Location: [facility] ENDOSCOPY;  Service: Gastroenterology;  Laterality: N/A;  . LAPAROSCOPIC APPENDECTOMY N/A 02/05/2018   Procedure: APPENDECTOMY LAPAROSCOPIC;  Surgeon: Jules Husbands, MD;  Location: [facility] ORS;  Service: General;  Laterality: N/A;  . right arm surgery    .  TUBAL LIGATION      Medical History: Past Medical History:  Diagnosis Date  . Asthma   . Atopic dermatitis   . Collagen vascular disease (HCC)    Rhematoid Arthritis  . Constipation   . COPD (chronic obstructive pulmonary disease) ([place])   . Diabetes mellitus without complication ([place])   . DVT (deep venous thrombosis) ([place])   . GERD (gastroesophageal reflux disease)   . Hyperlipidemia   . Hypertension   . Rheumatoid arthritis ([place])     Family History: Family History  Problem Relation Age of Onset  . Breast cancer Mother   . Hypertension Mother   . Diabetes Mother   . Hypertension Father   . Heart failure Father     Social History   Socioeconomic History  . Marital status:  Single    Spouse name: Not on file  . Number of children: Not on file  . Years of education: Not on file  . Highest education level: Not on file  Occupational History  . Not on file  Tobacco Use  . Smoking status: Former Research scientist (life sciences)  . Smokeless tobacco: Never Used  Vaping Use  . Vaping Use: Never used  Substance and Sexual Activity  . Alcohol use: No  . Drug use: No  . Sexual activity: Yes  Other Topics Concern  . Not on file  Social History Narrative  . Not on file   Social Determinants of Health   Financial Resource Strain:   . Difficulty of Paying Living Expenses: Not on file  Food Insecurity:   . Worried About Charity fundraiser in the Last Year: Not on file  . Ran Out of Food in the Last Year: Not on file  Transportation Needs:   . Lack of Transportation (Medical): Not on file  . Lack of Transportation (Non-Medical): Not on file  Physical Activity:   . Days of Exercise per Week: Not on file  . Minutes of Exercise per Session: Not on file  Stress:   . Feeling of Stress : Not on file  Social Connections:   . Frequency of Communication with Friends and Family: Not on file  . Frequency of Social Gatherings with Friends and Family: Not on file  . Attends Religious Services: Not on  file  . Active Member of Clubs or Organizations: Not on file  . Attends Archivist Meetings: Not on file  . Marital Status: Not on file  Intimate Partner Violence:   . Fear of Current or Ex-Partner: Not on file  . Emotionally Abused: Not on file  . Physically Abused: Not on file  . Sexually Abused: Not on file      Review of Systems  Constitutional: Negative for chills, diaphoresis and fatigue.  HENT: Negative for ear pain, postnasal drip and sinus pressure.   Eyes: Negative for photophobia, discharge, redness, itching and visual disturbance.  Respiratory: Negative for cough, shortness of breath and wheezing.   Cardiovascular: Negative for chest pain, palpitations and leg swelling.  Gastrointestinal: Negative for abdominal pain, constipation, diarrhea, nausea and vomiting.  Genitourinary: Negative for dysuria and flank pain.  Musculoskeletal: Negative for arthralgias, back pain, gait problem and neck pain.  Skin: Negative for color change.  Allergic/Immunologic: Negative for environmental allergies and food allergies.  Neurological: Negative for dizziness and headaches.  Hematological: Does not bruise/bleed easily.  Psychiatric/Behavioral: Negative for agitation, behavioral problems (depression) and hallucinations.    Vital Signs: BP 138/80   Pulse 75   Temp 97.7 F (36.5 C)   Resp 16   Ht 5\' 6"  (1.676 m)   Wt 273 lb 3.2 oz (123.9 kg)   SpO2 99%   BMI 44.10 kg/m    Physical Exam Constitutional:      General: She is not in acute distress.    Appearance: She is well-developed. She is not diaphoretic.  HENT:     Head: Normocephalic and atraumatic.     Mouth/Throat:     Pharynx: No oropharyngeal exudate.  Eyes:     Pupils: Pupils are equal, round, and reactive to light.  Neck:     Thyroid: No thyromegaly.     Vascular: No JVD.     Trachea: No tracheal deviation.  Cardiovascular:     Rate and Rhythm: Normal rate and regular rhythm.  Heart sounds:  Normal heart sounds. No murmur heard.  No friction rub. No gallop.   Pulmonary:     Effort: Pulmonary effort is normal. No respiratory distress.     Breath sounds: No wheezing or rales.  Chest:     Chest wall: No tenderness.  Abdominal:     General: Bowel sounds are normal.     Palpations: Abdomen is soft.  Musculoskeletal:        General: Normal range of motion.     Cervical back: Normal range of motion and neck supple.  Lymphadenopathy:     Cervical: No cervical adenopathy.  Skin:    General: Skin is warm and dry.  Neurological:     Mental Status: She is alert and oriented to person, place, and time.     Cranial Nerves: No cranial nerve deficit.  Psychiatric:        Behavior: Behavior normal.        Thought Content: Thought content normal.        Judgment: Judgment normal.        Assessment/Plan: 1. Chronic idiopathic constipation Continue Linzess as before Add polyethylene glycol (MIRALAX / GLYCOLAX) 17 g packet; Take one packets in one glass of water everyday  Dispense: 100 each; Refill: 3  2. Anemia due to GI blood loss Improving, will continue to monitor   3. Uncontrolled type 2 diabetes mellitus with hyperglycemia (HCC) Continue Victoza and add Farxiga 10 mg po qd   4. Obesity, Class III, BMI 40-49.9 (morbid obesity) (HCC) Pt is instructed to watch her calories, limit high carb diet    5. Long term (current) use of anticoagulants Continue Coumadin as before  - POCT INR  6. Personal history of venous thrombosis and embolism Pt will be on life long anticoagulation due to risk factors   7. Chronic bilateral low back pain with bilateral sciatica Continue acetaminophen-codeine (TYLENOL #3) 300-30 MG tablet; Take one tab po bid for chronic pain  Dispense: 60 tablet; Refill: 0, Monitor drug dependence  PDMP reviewed   General Counseling: Brenda Santana verbalizes understanding of the findings of todays visit and agrees with plan of treatment. I have discussed any further  diagnostic evaluation that may be needed or ordered today. We also reviewed her medications today. she has been encouraged to call the office with any questions or concerns that should arise related to todays visit.    Orders Placed This Encounter  Procedures  . POCT INR    Meds ordered this encounter  Medications  . polyethylene glycol (MIRALAX / GLYCOLAX) 17 g packet    Sig: Take one packets in one glass of water everyday    Dispense:  100 each    Refill:  3    Total time spen 35 Minutes Time spent includes review of chart, medications, test results, and follow up plan with the patient.      Dr Lavera Guise Internal medicine

## 2020-03-19 ENCOUNTER — Telehealth: Payer: Self-pay

## 2020-03-19 NOTE — Telephone Encounter (Signed)
Anticoagulant management form signed and faxed back to [facility] at [phone]. Copy placed in scan.

## 2020-04-09 ENCOUNTER — Other Ambulatory Visit: Payer: Self-pay

## 2020-04-09 ENCOUNTER — Ambulatory Visit (INDEPENDENT_AMBULATORY_CARE_PROVIDER_SITE_OTHER): Payer: Medicaid Other | Admitting: Internal Medicine

## 2020-04-09 ENCOUNTER — Encounter: Payer: Self-pay | Admitting: Internal Medicine

## 2020-04-09 DIAGNOSIS — K219 Gastro-esophageal reflux disease without esophagitis: Secondary | ICD-10-CM

## 2020-04-09 DIAGNOSIS — J449 Chronic obstructive pulmonary disease, unspecified: Secondary | ICD-10-CM | POA: Diagnosis not present

## 2020-04-09 DIAGNOSIS — G4733 Obstructive sleep apnea (adult) (pediatric): Secondary | ICD-10-CM | POA: Diagnosis not present

## 2020-04-09 DIAGNOSIS — R0602 Shortness of breath: Secondary | ICD-10-CM

## 2020-04-09 DIAGNOSIS — G479 Sleep disorder, unspecified: Secondary | ICD-10-CM

## 2020-04-09 MED ORDER — ALBUTEROL SULFATE HFA 108 (90 BASE) MCG/ACT IN AERS: INHALATION_SPRAY | RESPIRATORY_TRACT | 3 refills | Status: DC

## 2020-04-09 NOTE — Progress Notes (Signed)
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 Eyes: (-) visual changes, (-) redness, (-) itching. Nose and Sinuses: (-) nasal stuffiness or itchiness, (-) postnasal drip, (-) nosebleeds, (-) sinus trouble. Mouth and Throat: (-) sore throat, (-) hoarseness. Neck: (-) swollen glands, (-) enlarged thyroid, (-) neck pain. Respiratory: - cough, (-) bloody sputum, + shortness of breath, + wheezing. Cardiovascular: - ankle swelling, (-) chest pain. Lymphatic: (-) lymph node enlargement. Neurologic: (-) numbness, (-) tingling. Psychiatric: (-) anxiety, (-) depression   Current Medication: Outpatient Encounter Medications as of 04/09/2020  Medication Sig  . Accu-Chek FastClix Lancets MISC Use as directed once a daily diag E11.9  . acetaminophen-codeine (TYLENOL #3) 300-30 MG tablet Take one tab po bid for chronic pain  . acyclovir (ZOVIRAX) 400 MG tablet TAKE 1 TABLET BY MOUTH TWICE A DAY (Patient taking differently: Take 400 mg by mouth 2 (two) times daily. )  . acyclovir ointment (ZOVIRAX) 5 % APPLY TOPICALLY EVERY 3 (THREE) HOURS.  Marland Kitchen albuterol (VENTOLIN HFA) 108 (90 Base) MCG/ACT inhaler INHALE 2 PUFFS INTO THE LUNGS EVERY 4  (FOUR) HOURS AS NEEDED FOR WHEEZING OR SHORTNESS OF BREATH.  Marland Kitchen allopurinol (ZYLOPRIM) 300 MG tablet TAKE ONE TAB AT NIGHT FOR GOUT (Patient taking differently: Take 300 mg by mouth at bedtime. )  . aspirin EC 81 MG tablet Take 81 mg by mouth daily.  Marland Kitchen atorvastatin (LIPITOR) 10 MG tablet TAKE 1 TABLET BY MOUTH EVERY DAY (Patient taking differently: Take 10 mg by mouth daily. )  . baclofen (LIORESAL) 10 MG tablet Take 1 tablet (10 mg total) by mouth 3 (three) times daily.  . BD PEN NEEDLE NANO U/F 32G X 4 MM MISC USE DAILY WITH VICTOZA  . Biotin 5 MG CAPS Take 5 mg by mouth daily.   . Budeson-Glycopyrrol-Formoterol (BREZTRI AEROSPHERE) 160-9-4.8 MCG/ACT AERO Inhale 2 puffs into the lungs in the morning and at bedtime.  . cetirizine (ZYRTEC ALLERGY) 10 MG tablet Take 1 tablet (10 mg total) by mouth daily as needed for allergies.  . clobetasol cream (TEMOVATE) 2.87 % Apply 1 application topically 2 (two) times daily as needed (for eczema on hands).   . dapagliflozin propanediol (FARXIGA) 10 MG TABS tablet Take one tab po qd for dm  . diclofenac sodium (VOLTAREN) 1 % GEL APPLY 4 G TOPICALLY FOUR (4) TIMES A DAY.  Marland Kitchen docusate sodium (COLACE) 100 MG capsule Take 100 mg by mouth daily.   Marland Kitchen doxycycline (VIBRA-TABS) 100 MG tablet TAKE 1 TABLET BY MOUTH TWICE A DAY (Patient taking differently: Take 100 mg by mouth 2 (two) times daily. )  . Ferrous Sulfate (IRON) 325 (65 Fe) MG TABS Take 325 mg by mouth 3 (three) times daily.   . Fluticasone-Umeclidin-Vilant (TRELEGY ELLIPTA) 100-62.5-25 MCG/INH AEPB Take one inhalation  a day  . furosemide (LASIX) 20 MG tablet Take 1 tablet (20 mg total) by mouth daily.  Marland Kitchen gabapentin (NEURONTIN) 800 MG tablet Take 1 tablet (800 mg total) by mouth 3 (three) times daily.  Marland Kitchen glucose blood (ACCU-CHEK GUIDE) test strip 1 each by Other route daily. Use as instructed  Once daily diag e11.9  . ipratropium-albuterol (DUONEB) 0.5-2.5 (3) MG/3ML SOLN Take 3 mLs by nebulization every 6  (six) hours as needed (for shortness of breath or wheezing).   Marland Kitchen lidocaine (XYLOCAINE) 5 % ointment APPLY A SMALL AMOUNT TO AFFECTED AREA 2-3 TIMES A DAY  . LINZESS 145 MCG CAPS capsule TAKE 1 CAPSULE BY MOUTH EVERY DAY BEFORE BREAKFAST (Patient taking differently: Take 145 mcg by mouth daily before breakfast. )  . lisinopril (ZESTRIL) 10 MG tablet TAKE 1 TAB DAILY IN MORNING  . metoprolol tartrate (LOPRESSOR) 50 MG tablet TAKE 1 TABLET BY MOUTH TWICE A DAY  . montelukast (SINGULAIR) 10 MG tablet TAKE 1 TABLET BY MOUTH DAILY FOR ASTHMA  . ondansetron (ZOFRAN ODT) 4 MG disintegrating tablet Allow 1-2 tablets to dissolve in your mouth every 8 hours as needed for nausea/vomiting  . oxymetazoline (AFRIN) 0.05 % nasal spray Place 1 spray into both nostrils 2 (two) times daily as needed for congestion.  . pantoprazole (PROTONIX) 40 MG tablet Take 1 tablet (40 mg total) by mouth 2 (two) times daily.  . polyethylene glycol (MIRALAX / GLYCOLAX) 17 g packet Take one packets in one glass of water everyday  . predniSONE (DELTASONE) 10 MG tablet Take 1 tablet three times daily with a meal for 2 days followed by 1 tablet two days for 2 days. Then resume 10 mg daily as prescribed.  . traMADol (ULTRAM) 50 MG tablet Take 1 tablet (50 mg total) by mouth every 8 (eight) hours as needed for moderate pain.  Marland Kitchen tretinoin (RETIN-A) 0.025 % cream Apply 1 application topically at bedtime as needed (for eczema on face).   Marland Kitchen VICTOZA 18 MG/3ML SOPN INJECT 1.8 MG UNDER THE SKIN ONCE DAILY IN THE MORNING  . warfarin (COUMADIN) 7.5 MG tablet Take 7.5 mg by mouth daily. Take 1 tab po daily except Saturday and Sunday take 2 tab po daily   No facility-administered encounter medications on file as of 04/09/2020.    Surgical History: Past Surgical History:  Procedure Laterality Date  . APPENDECTOMY    . COLONOSCOPY WITH PROPOFOL N/A 02/02/2018   Procedure: COLONOSCOPY WITH PROPOFOL;  Surgeon: Jonathon Bellows, MD;  Location: [facility]  ENDOSCOPY;  Service: Gastroenterology;  Laterality: N/A;  . ESOPHAGOGASTRODUODENOSCOPY (EGD) WITH PROPOFOL N/A 02/08/2020   Procedure: ESOPHAGOGASTRODUODENOSCOPY (EGD) WITH PROPOFOL;  Surgeon: Jonathon Bellows, MD;  Location: [facility] ENDOSCOPY;  Service: Gastroenterology;  Laterality: N/A;  . LAPAROSCOPIC APPENDECTOMY N/A 02/05/2018   Procedure: APPENDECTOMY LAPAROSCOPIC;  Surgeon: Jules Husbands, MD;  Location: [facility] ORS;  Service: General;  Laterality: N/A;  . right arm surgery    . TUBAL LIGATION      Medical History: Past Medical History:  Diagnosis Date  . Asthma   . Atopic dermatitis   . Collagen vascular disease (HCC)    Rhematoid Arthritis  . Constipation   . COPD (chronic obstructive pulmonary disease) ([place])   . Diabetes mellitus without complication ([place])   . DVT (deep venous thrombosis) ([place])   . GERD (gastroesophageal reflux disease)   . Hyperlipidemia   . Hypertension   . Rheumatoid arthritis ([place])     Family History: Family History  Problem  Relation Age of Onset  . Breast cancer Mother   . Hypertension Mother   . Diabetes Mother   . Hypertension Father   . Heart failure Father     Social History: Social History   Socioeconomic History  . Marital status: Single    Spouse name: Not on file  . Number of children: Not on file  . Years of education: Not on file  . Highest education level: Not on file  Occupational History  . Not on file  Tobacco Use  . Smoking status: Former Research scientist (life sciences)  . Smokeless tobacco: Never Used  Vaping Use  . Vaping Use: Never used  Substance and Sexual Activity  . Alcohol use: No  . Drug use: No  . Sexual activity: Yes  Other Topics Concern  . Not on file  Social History Narrative  . Not on file   Social Determinants of Health   Financial Resource Strain: Not on file  Food Insecurity: Not on file  Transportation Needs: Not on file  Physical Activity: Not on file  Stress: Not on file  Social Connections: Not on file  Intimate  Partner Violence: Not on file    Vital Signs: Blood pressure 120/84, pulse 77, temperature (!) 97.3 F (36.3 C), resp. rate 16, height '5\' 6"'  (1.676 m), weight 273 lb (123.8 kg), SpO2 99 %.  Examination: General Appearance: The patient is well-developed, well-nourished, and in no distress. Skin: Gross inspection of skin unremarkable. Head: normocephalic, no gross deformities. Eyes: no gross deformities noted. ENT: ears appear grossly normal no exudates. Neck: Supple. No thyromegaly. No LAD. Respiratory: Bilateral wheezing prominent in bases, no rhonchi or rales noted. Cardiovascular: Normal S1 and S2 without murmur or rub. Extremities: No cyanosis. pulses are equal. Neurologic: Alert and oriented. No involuntary movements.  LABS: Recent Results (from the past 2160 hour(s))  POCT INR     Status: None   Collection Time: 01/15/20  8:53 AM  Result Value Ref Range   INR 2.9 2.0 - 3.0    Comment: PT was 35.3 per T.H. pt takes coumadin 5 mg, 2 tablets on saturday and sunday and 1 tablet rest of the week  POCT INR     Status: None   Collection Time: 02/04/20  9:27 AM  Result Value Ref Range   INR 2.9 2.0 - 3.0    Comment: PT 34.4  Glucose, capillary     Status: Abnormal   Collection Time: 02/07/20  8:07 AM  Result Value Ref Range   Glucose-Capillary 347 (H) 70 - 99 mg/dL    Comment: Glucose reference range applies only to samples taken after fasting for at least 8 hours.  Comprehensive metabolic panel     Status: Abnormal   Collection Time: 02/07/20  8:22 AM  Result Value Ref Range   Sodium 131 (L) 135 - 145 mmol/L   Potassium 3.9 3.5 - 5.1 mmol/L   Chloride 99 98 - 111 mmol/L   CO2 21 (L) 22 - 32 mmol/L   Glucose, Bld 373 (H) 70 - 99 mg/dL    Comment: Glucose reference range applies only to samples taken after fasting for at least 8 hours.   BUN 78 (H) 6 - 20 mg/dL   Creatinine, Ser 1.12 (H) 0.44 - 1.00 mg/dL   Calcium 8.1 (L) 8.9 - 10.3 mg/dL   Total Protein 5.2 (L) 6.5 - 8.1  g/dL   Albumin 2.9 (L) 3.5 - 5.0 g/dL   AST 21 15 - 41 U/L  ALT 19 0 - 44 U/L   Alkaline Phosphatase 56 38 - 126 U/L   Total Bilirubin 0.6 0.3 - 1.2 mg/dL   GFR calc non Af Amer 54 (L) >60 mL/min   Anion gap 11 5 - 15    Comment: Performed at [facility], [address] [place], [place] [address]  Lipase, blood     Status: None   Collection Time: 02/07/20  8:22 AM  Result Value Ref Range   Lipase 40 11 - 51 U/L    Comment: Performed at [facility], [address] [place], [place] [address]  CBC with Differential     Status: Abnormal   Collection Time: 02/07/20  8:22 AM  Result Value Ref Range   WBC 14.7 (H) 4.0 - 10.5 K/uL   RBC 2.17 (L) 3.87 - 5.11 MIL/uL   Hemoglobin 7.0 (L) 12.0 - 15.0 g/dL   HCT 22.2 (L) 36.0 - 46.0 %   MCV 102.3 (H) 80.0 - 100.0 fL   MCH 32.3 26.0 - 34.0 pg   MCHC 31.5 30.0 - 36.0 g/dL   RDW 14.1 11.5 - 15.5 %   Platelets 230 150 - 400 K/uL   nRBC 0.0 0.0 - 0.2 %   Neutrophils Relative % 69 %   Neutro Abs 10.3 (H) 1.7 - 7.7 K/uL   Lymphocytes Relative 21 %   Lymphs Abs 3.0 0.7 - 4.0 K/uL   Monocytes Relative 8 %   Monocytes Absolute 1.2 (H) 0.1 - 1.0 K/uL   Eosinophils Relative 1 %   Eosinophils Absolute 0.1 0.0 - 0.5 K/uL   Basophils Relative 0 %   Basophils Absolute 0.0 0.0 - 0.1 K/uL   Immature Granulocytes 1 %   Abs Immature Granulocytes 0.07 0.00 - 0.07 K/uL    Comment: Performed at [facility], [address] [place], [place] [address]  Beta-hydroxybutyric acid     Status: Abnormal   Collection Time: 02/07/20  8:22 AM  Result Value Ref Range   Beta-Hydroxybutyric Acid 0.34 (H) 0.05 - 0.27 mmol/L    Comment: Performed at [facility], [address] [place], [place] [address]  Brain natriuretic peptide     Status: None   Collection Time: 02/07/20  8:22 AM  Result Value Ref Range   B Natriuretic Peptide 28.5 0.0 - 100.0 pg/mL    Comment: Performed at [facility], [address],  [place], [place] [address]  ABO/Rh     Status: None   Collection Time: 02/07/20  8:22 AM  Result Value Ref Range   ABO/RH(D)      O POS Performed at [facility], [address] [place], [place] [address]   Blood gas, venous     Status: Abnormal   Collection Time: 02/07/20  8:23 AM  Result Value Ref Range   pH, Ven 7.39 7.250 - 7.430   pCO2, Ven 39 (L) 44.0 - 60.0 mmHg   pO2, Ven 62.0 (H) 32.0 - 45.0 mmHg   Bicarbonate 23.6 20.0 - 28.0 mmol/L   Acid-base deficit 1.2 0.0 - 2.0 mmol/L   O2 Saturation 91.1 %   Patient temperature 37.0    Collection site VENOUS    Sample type VENOUS     Comment: Performed at [facility], [address] [place], [place] [address]  Respiratory Panel by RT PCR (Flu A&B, Covid) -     Status: None   Collection Time: 02/07/20  8:23 AM   Specimen:  Nasopharyngeal  Result Value Ref Range   SARS Coronavirus 2 by RT PCR NEGATIVE NEGATIVE    Comment: (NOTE) SARS-CoV-2 target nucleic acids are NOT DETECTED.  The SARS-CoV-2 RNA is generally detectable in upper respiratoy specimens during the acute phase of infection. The lowest concentration of SARS-CoV-2 viral copies this assay can detect is 131 copies/mL. A negative result does not preclude SARS-Cov-2 infection and should not be used as the sole basis for treatment or other patient management decisions. A negative result may occur with  improper specimen collection/handling, submission of specimen other than nasopharyngeal swab, presence of viral mutation(s) within the areas targeted by this assay, and inadequate number of viral copies (<131 copies/mL). A negative result must be combined with clinical observations, patient history, and epidemiological information. The expected result is Negative.  Fact Sheet for Patients:  PinkCheek.be  Fact Sheet for Healthcare Providers:  GravelBags.it  This test is no t yet approved or  cleared by the [place] FDA and  has been authorized for detection and/or diagnosis of SARS-CoV-2 by FDA under an Emergency Use Authorization (EUA). This EUA will remain  in effect (meaning this test can be used) for the duration of the COVID-19 declaration under Section 564(b)(1) of the Act, 21 U.S.C. section 360bbb-3(b)(1), unless the authorization is terminated or revoked sooner.     Influenza A by PCR NEGATIVE NEGATIVE   Influenza B by PCR NEGATIVE NEGATIVE    Comment: (NOTE) The Xpert Xpress SARS-CoV-2/FLU/RSV assay is intended as an aid in  the diagnosis of influenza from Nasopharyngeal swab specimens and  should not be used as a sole basis for treatment. Nasal washings and  aspirates are unacceptable for Xpert Xpress SARS-CoV-2/FLU/RSV  testing.  Fact Sheet for Patients: PinkCheek.be  Fact Sheet for Healthcare Providers: GravelBags.it  This test is not yet approved or cleared by the [place] FDA and  has been authorized for detection and/or diagnosis of SARS-CoV-2 by  FDA under an Emergency Use Authorization (EUA). This EUA will remain  in effect (meaning this test can be used) for the duration of the  Covid-19 declaration under Section 564(b)(1) of the Act, 21  U.S.C. section 360bbb-3(b)(1), unless the authorization is  terminated or revoked. Performed at [facility], [address] [place], [place] [address]   Prepare RBC (crossmatch)     Status: None   Collection Time: 02/07/20  9:38 AM  Result Value Ref Range   Order Confirmation      ORDER PROCESSED BY BLOOD BANK Performed at [facility], [address] [place], [place] [address]   Pregnancy, urine     Status: None   Collection Time: 02/07/20  9:46 AM  Result Value Ref Range   Preg Test, Ur NEGATIVE NEGATIVE    Comment: Performed at [facility], [address] [place], [place] [address]  Protime-INR      Status: Abnormal   Collection Time: 02/07/20  9:48 AM  Result Value Ref Range   Prothrombin Time 20.0 (H) 11.4 - 15.2 seconds   INR 1.8 (H) 0.8 - 1.2    Comment: (NOTE) INR goal varies based on device and disease states. Performed at [facility], [address] [place], [place] [address]   Type and screen [place]     Status: None   Collection Time: 02/07/20  9:48 AM  Result Value Ref Range   ABO/RH(D) O POS    Antibody Screen NEG    Sample Expiration 02/10/2020,2359  Unit Number [id number]    Blood Component Type RBC LR PHER1    Unit division 00    Status of Unit REL FROM [facility]    Transfusion Status OK TO TRANSFUSE    Crossmatch Result Compatible    Unit Number [id number]    Blood Component Type RED CELLS,LR    Unit division 00    Status of Unit REL FROM [facility]    Transfusion Status OK TO TRANSFUSE    Crossmatch Result Compatible    Unit Number [id number]    Blood Component Type RED CELLS,LR    Unit division 00    Status of Unit ISSUED,FINAL    Transfusion Status OK TO TRANSFUSE    Crossmatch Result Compatible    Unit Number [id number]    Blood Component Type RED CELLS,LR    Unit division 00    Status of Unit ISSUED,FINAL    Transfusion Status OK TO TRANSFUSE    Crossmatch Result Compatible    Unit Number [id number]    Blood Component Type RED CELLS,LR    Unit division 00    Status of Unit ISSUED,FINAL    Transfusion Status OK TO TRANSFUSE    Crossmatch Result      Compatible Performed at [facility], [address] [place], [place] [address]   APTT     Status: None   Collection Time: 02/07/20  9:48 AM  Result Value Ref Range   aPTT 28 24 - 36 seconds    Comment: Performed at [facility], [address] [place], [place] [address]  BPAM RBC     Status: None   Collection Time: 02/07/20  9:48 AM  Result Value Ref Range   Blood Product Unit Number [id number]    PRODUCT CODE [id number]     Unit Type and Rh 5100    Blood Product Expiration Date 169678938101    ISSUE DATE / TIME 751025852778    Blood Product Unit Number [id number]    PRODUCT CODE Q0086P61    Unit Type and Rh 5100    Blood Product Expiration Date 950932671245    ISSUE DATE / TIME 809983382505    Blood Product Unit Number [id number]    PRODUCT CODE W4097D53    Unit Type and Rh 9500    Blood Product Expiration Date 299242683419    ISSUE DATE / TIME 622297989211    Blood Product Unit Number [id number]    PRODUCT CODE H6314H70    Unit Type and Rh 9500    Blood Product Expiration Date 263785885027    ISSUE DATE / TIME 741287867672    Blood Product Unit Number [id number]    PRODUCT CODE H4765Y65    Unit Type and Rh 9500    Blood Product Expiration Date 202110122359   Urinalysis, Complete w Microscopic     Status: Abnormal   Collection Time: 02/07/20 10:00 AM  Result Value Ref Range   Color, Urine STRAW (A) YELLOW   APPearance CLEAR (A) CLEAR   Specific Gravity, Urine 1.014 1.005 - 1.030   pH 5.0 5.0 - 8.0   Glucose, UA >=500 (A) NEGATIVE mg/dL   Hgb urine dipstick NEGATIVE NEGATIVE   Bilirubin Urine NEGATIVE NEGATIVE   Ketones, ur NEGATIVE NEGATIVE mg/dL   Protein, ur NEGATIVE NEGATIVE mg/dL   Nitrite NEGATIVE NEGATIVE   Leukocytes,Ua NEGATIVE NEGATIVE   WBC, UA 0-5 0 - 5 WBC/hpf   Bacteria, UA NONE SEEN NONE SEEN   Squamous Epithelial / LPF NONE SEEN 0 - 5  Comment: Performed at [facility], [address] [place], [place] [address]  C Difficile Quick Screen w PCR reflex     Status: None   Collection Time: 02/07/20 11:06 AM   Specimen: STOOL  Result Value Ref Range   C Diff antigen NEGATIVE NEGATIVE   C Diff toxin NEGATIVE NEGATIVE   C Diff interpretation No C. difficile detected.     Comment: Performed at [facility], [address] [place], [place] [address]  Gastrointestinal Panel by PCR , Stool     Status: None   Collection Time: 02/07/20 11:07 AM    Specimen: Stool  Result Value Ref Range   Campylobacter species NOT DETECTED NOT DETECTED   Plesimonas shigelloides NOT DETECTED NOT DETECTED   Salmonella species NOT DETECTED NOT DETECTED   Yersinia enterocolitica NOT DETECTED NOT DETECTED   Vibrio species NOT DETECTED NOT DETECTED   Vibrio cholerae NOT DETECTED NOT DETECTED   Enteroaggregative E coli (EAEC) NOT DETECTED NOT DETECTED   Enteropathogenic E coli (EPEC) NOT DETECTED NOT DETECTED   Enterotoxigenic E coli (ETEC) NOT DETECTED NOT DETECTED   Shiga like toxin producing E coli (STEC) NOT DETECTED NOT DETECTED   Shigella/Enteroinvasive E coli (EIEC) NOT DETECTED NOT DETECTED   Cryptosporidium NOT DETECTED NOT DETECTED   Cyclospora cayetanensis NOT DETECTED NOT DETECTED   Entamoeba histolytica NOT DETECTED NOT DETECTED   Giardia lamblia NOT DETECTED NOT DETECTED   Adenovirus F40/41 NOT DETECTED NOT DETECTED   Astrovirus NOT DETECTED NOT DETECTED   Norovirus GI/GII NOT DETECTED NOT DETECTED   Rotavirus A NOT DETECTED NOT DETECTED   Sapovirus (I, II, IV, and V) NOT DETECTED NOT DETECTED    Comment: Performed at [facility], [address] [place], [place] [address]  Glucose, capillary     Status: Abnormal   Collection Time: 02/07/20  3:06 PM  Result Value Ref Range   Glucose-Capillary 339 (H) 70 - 99 mg/dL    Comment: Glucose reference range applies only to samples taken after fasting for at least 8 hours.  CBC     Status: Abnormal   Collection Time: 02/07/20  3:13 PM  Result Value Ref Range   WBC 15.7 (H) 4.0 - 10.5 K/uL   RBC 2.53 (L) 3.87 - 5.11 MIL/uL   Hemoglobin 8.0 (L) 12.0 - 15.0 g/dL   HCT 24.2 (L) 36.0 - 46.0 %   MCV 95.7 80.0 - 100.0 fL   MCH 31.6 26.0 - 34.0 pg   MCHC 33.1 30.0 - 36.0 g/dL   RDW 17.1 (H) 11.5 - 15.5 %   Platelets 205 150 - 400 K/uL   nRBC 0.0 0.0 - 0.2 %    Comment: Performed at [facility], [address] [place], [place] [address]  Glucose, capillary     Status:  Abnormal   Collection Time: 02/07/20  6:39 PM  Result Value Ref Range   Glucose-Capillary 282 (H) 70 - 99 mg/dL    Comment: Glucose reference range applies only to samples taken after fasting for at least 8 hours.  CBC     Status: Abnormal   Collection Time: 02/07/20  6:40 PM  Result Value Ref Range   WBC 17.3 (H) 4.0 - 10.5 K/uL   RBC 2.95 (L) 3.87 - 5.11 MIL/uL   Hemoglobin 9.1 (L) 12.0 - 15.0 g/dL   HCT 26.7 (L) 36.0 - 46.0 %   MCV 90.5 80.0 - 100.0 fL   MCH 30.8 26.0 - 34.0 pg  MCHC 34.1 30.0 - 36.0 g/dL   RDW 20.3 (H) 11.5 - 15.5 %   Platelets 211 150 - 400 K/uL   nRBC 0.1 0.0 - 0.2 %    Comment: Performed at [facility], [address] [place], [place] [address]  Glucose, capillary     Status: Abnormal   Collection Time: 02/07/20  9:23 PM  Result Value Ref Range   Glucose-Capillary 198 (H) 70 - 99 mg/dL    Comment: Glucose reference range applies only to samples taken after fasting for at least 8 hours.  CBC     Status: Abnormal   Collection Time: 02/07/20  9:51 PM  Result Value Ref Range   WBC 19.5 (H) 4.0 - 10.5 K/uL   RBC 3.05 (L) 3.87 - 5.11 MIL/uL   Hemoglobin 9.4 (L) 12.0 - 15.0 g/dL   HCT 27.6 (L) 36.0 - 46.0 %   MCV 90.5 80.0 - 100.0 fL   MCH 30.8 26.0 - 34.0 pg   MCHC 34.1 30.0 - 36.0 g/dL   RDW 21.0 (H) 11.5 - 15.5 %   Platelets 220 150 - 400 K/uL   nRBC 0.0 0.0 - 0.2 %    Comment: Performed at [facility], [address] [place], [place] [address]  HIV Antibody (routine testing w rflx)     Status: None   Collection Time: 02/07/20  9:51 PM  Result Value Ref Range   HIV Screen 4th Generation wRfx Non Reactive Non Reactive    Comment: Performed at [facility], [place], [place], [place] [address]  CBC     Status: Abnormal   Collection Time: 02/08/20  4:04 AM  Result Value Ref Range   WBC 20.1 (H) 4.0 - 10.5 K/uL   RBC 2.79 (L) 3.87 - 5.11 MIL/uL   Hemoglobin 8.6 (L) 12.0 - 15.0 g/dL   HCT 25.3 (L) 36.0 - 46.0 %    MCV 90.7 80.0 - 100.0 fL   MCH 30.8 26.0 - 34.0 pg   MCHC 34.0 30.0 - 36.0 g/dL   RDW 21.8 (H) 11.5 - 15.5 %   Platelets 222 150 - 400 K/uL   nRBC 0.1 0.0 - 0.2 %    Comment: Performed at [facility], [address] [place], [place] [address]  Basic metabolic panel     Status: Abnormal   Collection Time: 02/08/20  4:04 AM  Result Value Ref Range   Sodium 137 135 - 145 mmol/L   Potassium 3.7 3.5 - 5.1 mmol/L   Chloride 107 98 - 111 mmol/L   CO2 23 22 - 32 mmol/L   Glucose, Bld 146 (H) 70 - 99 mg/dL    Comment: Glucose reference range applies only to samples taken after fasting for at least 8 hours.   BUN 53 (H) 6 - 20 mg/dL   Creatinine, Ser 1.13 (H) 0.44 - 1.00 mg/dL   Calcium 8.2 (L) 8.9 - 10.3 mg/dL   GFR, Estimated 53 (L) >60 mL/min   Anion gap 7 5 - 15    Comment: Performed at [facility], [address] [place], [place] [address]  Glucose, capillary     Status: Abnormal   Collection Time: 02/08/20  7:24 AM  Result Value Ref Range   Glucose-Capillary 193 (H) 70 - 99 mg/dL    Comment: Glucose reference range applies only to samples taken after fasting for at least 8 hours.  CBC     Status: Abnormal  Collection Time: 02/08/20 10:04 AM  Result Value Ref Range   WBC 17.8 (H) 4.0 - 10.5 K/uL   RBC 2.51 (L) 3.87 - 5.11 MIL/uL   Hemoglobin 7.6 (L) 12.0 - 15.0 g/dL   HCT 23.1 (L) 36.0 - 46.0 %   MCV 92.0 80.0 - 100.0 fL   MCH 30.3 26.0 - 34.0 pg   MCHC 32.9 30.0 - 36.0 g/dL   RDW 21.8 (H) 11.5 - 15.5 %   Platelets 193 150 - 400 K/uL   nRBC 0.0 0.0 - 0.2 %    Comment: Performed at [facility], [address] [place], [place] [address]  Glucose, capillary     Status: Abnormal   Collection Time: 02/08/20  1:31 PM  Result Value Ref Range   Glucose-Capillary 303 (H) 70 - 99 mg/dL    Comment: Glucose reference range applies only to samples taken after fasting for at least 8 hours.  CBC     Status: Abnormal   Collection Time: 02/08/20  4:22 PM   Result Value Ref Range   WBC 15.6 (H) 4.0 - 10.5 K/uL   RBC 2.42 (L) 3.87 - 5.11 MIL/uL   Hemoglobin 7.4 (L) 12.0 - 15.0 g/dL   HCT 22.0 (L) 36.0 - 46.0 %   MCV 90.9 80.0 - 100.0 fL   MCH 30.6 26.0 - 34.0 pg   MCHC 33.6 30.0 - 36.0 g/dL   RDW 21.0 (H) 11.5 - 15.5 %   Platelets 200 150 - 400 K/uL   nRBC 0.2 0.0 - 0.2 %    Comment: Performed at [facility], [address] [place], [place] [address]  Glucose, capillary     Status: Abnormal   Collection Time: 02/08/20  6:46 PM  Result Value Ref Range   Glucose-Capillary 360 (H) 70 - 99 mg/dL    Comment: Glucose reference range applies only to samples taken after fasting for at least 8 hours.  Glucose, capillary     Status: Abnormal   Collection Time: 02/08/20  8:51 PM  Result Value Ref Range   Glucose-Capillary 261 (H) 70 - 99 mg/dL    Comment: Glucose reference range applies only to samples taken after fasting for at least 8 hours.  CBC     Status: Abnormal   Collection Time: 02/08/20 10:25 PM  Result Value Ref Range   WBC 16.3 (H) 4.0 - 10.5 K/uL   RBC 2.42 (L) 3.87 - 5.11 MIL/uL   Hemoglobin 7.4 (L) 12.0 - 15.0 g/dL   HCT 22.4 (L) 36.0 - 46.0 %   MCV 92.6 80.0 - 100.0 fL   MCH 30.6 26.0 - 34.0 pg   MCHC 33.0 30.0 - 36.0 g/dL   RDW 21.2 (H) 11.5 - 15.5 %   Platelets 204 150 - 400 K/uL   nRBC 0.4 (H) 0.0 - 0.2 %    Comment: Performed at [facility], [address] [place], [place] [address]  CBC     Status: Abnormal   Collection Time: 02/09/20  5:28 AM  Result Value Ref Range   WBC 15.2 (H) 4.0 - 10.5 K/uL   RBC 2.24 (L) 3.87 - 5.11 MIL/uL   Hemoglobin 6.8 (L) 12.0 - 15.0 g/dL   HCT 20.6 (L) 36.0 - 46.0 %   MCV 92.0 80.0 - 100.0 fL   MCH 30.4 26.0 - 34.0 pg   MCHC 33.0 30.0 - 36.0 g/dL   RDW 20.6 (H) 11.5 - 15.5 %   Platelets 191  150 - 400 K/uL   nRBC 0.5 (H) 0.0 - 0.2 %    Comment: Performed at [facility], [address] [place], [place] [address]  Basic metabolic panel      Status: Abnormal   Collection Time: 02/09/20  5:28 AM  Result Value Ref Range   Sodium 137 135 - 145 mmol/L   Potassium 3.6 3.5 - 5.1 mmol/L   Chloride 104 98 - 111 mmol/L   CO2 25 22 - 32 mmol/L   Glucose, Bld 150 (H) 70 - 99 mg/dL    Comment: Glucose reference range applies only to samples taken after fasting for at least 8 hours.   BUN 25 (H) 6 - 20 mg/dL   Creatinine, Ser 1.06 (H) 0.44 - 1.00 mg/dL   Calcium 8.3 (L) 8.9 - 10.3 mg/dL   GFR, Estimated 57 (L) >60 mL/min   Anion gap 8 5 - 15    Comment: Performed at [facility], [address], [place], [place] [address]  Magnesium     Status: None   Collection Time: 02/09/20  5:28 AM  Result Value Ref Range   Magnesium 2.0 1.7 - 2.4 mg/dL    Comment: Performed at [facility], [address], [place], [place] [address]  Prepare RBC (crossmatch)     Status: None   Collection Time: 02/09/20  7:40 AM  Result Value Ref Range   Order Confirmation      ORDER PROCESSED BY BLOOD BANK Performed at [facility], [address] [place], [place] [address]   Glucose, capillary     Status: Abnormal   Collection Time: 02/09/20  8:05 AM  Result Value Ref Range   Glucose-Capillary 163 (H) 70 - 99 mg/dL    Comment: Glucose reference range applies only to samples taken after fasting for at least 8 hours.  Glucose, capillary     Status: Abnormal   Collection Time: 02/09/20 11:47 AM  Result Value Ref Range   Glucose-Capillary 288 (H) 70 - 99 mg/dL    Comment: Glucose reference range applies only to samples taken after fasting for at least 8 hours.  Hemoglobin and hematocrit, blood     Status: Abnormal   Collection Time: 02/09/20  2:18 PM  Result Value Ref Range   Hemoglobin 7.2 (L) 12.0 - 15.0 g/dL   HCT 22.3 (L) 36.0 - 46.0 %    Comment: Performed at [facility], [address] [place], [place] [address]  Glucose, capillary     Status: Abnormal   Collection Time: 02/09/20  4:40 PM  Result Value  Ref Range   Glucose-Capillary 297 (H) 70 - 99 mg/dL    Comment: Glucose reference range applies only to samples taken after fasting for at least 8 hours.  Hemoglobin and hematocrit, blood     Status: Abnormal   Collection Time: 02/09/20  6:50 PM  Result Value Ref Range   Hemoglobin 7.4 (L) 12.0 - 15.0 g/dL   HCT 23.2 (L) 36.0 - 46.0 %    Comment: Performed at [facility], [address] [place], [place] [address]  Glucose, capillary     Status: Abnormal   Collection Time: 02/09/20  8:38 PM  Result Value Ref Range   Glucose-Capillary 328 (H) 70 - 99 mg/dL    Comment: Glucose reference range applies only to samples taken after fasting for at least 8 hours.  Hemoglobin and hematocrit, blood     Status: Abnormal   Collection Time:  02/10/20 12:57 AM  Result Value Ref Range   Hemoglobin 8.3 (L) 12.0 - 15.0 g/dL   HCT 25.2 (L) 36.0 - 46.0 %    Comment: Performed at [facility], [address] [place], [place] [address]  Basic metabolic panel     Status: Abnormal   Collection Time: 02/10/20 12:57 AM  Result Value Ref Range   Sodium 138 135 - 145 mmol/L   Potassium 3.9 3.5 - 5.1 mmol/L   Chloride 103 98 - 111 mmol/L   CO2 23 22 - 32 mmol/L   Glucose, Bld 174 (H) 70 - 99 mg/dL    Comment: Glucose reference range applies only to samples taken after fasting for at least 8 hours.   BUN 15 6 - 20 mg/dL   Creatinine, Ser 0.97 0.44 - 1.00 mg/dL   Calcium 8.6 (L) 8.9 - 10.3 mg/dL   GFR, Estimated >60 >60 mL/min   Anion gap 12 5 - 15    Comment: Performed at [facility], [address] [place], [place] [address]  Vitamin B12     Status: None   Collection Time: 02/10/20 12:57 AM  Result Value Ref Range   Vitamin B-12 555 180 - 914 pg/mL    Comment: (NOTE) This assay is not validated for testing neonatal or myeloproliferative syndrome specimens for Vitamin B12 levels. Performed at [facility], [place] [address], [place], [place] [address]   Hemoglobin  and hematocrit, blood     Status: Abnormal   Collection Time: 02/10/20  6:55 AM  Result Value Ref Range   Hemoglobin 7.8 (L) 12.0 - 15.0 g/dL   HCT 23.8 (L) 36.0 - 46.0 %    Comment: Performed at [facility], [address] [place], [place] [address]  Reticulocytes     Status: Abnormal   Collection Time: 02/10/20  6:55 AM  Result Value Ref Range   Retic Ct Pct 4.4 (H) 0.4 - 3.1 %   RBC. 2.53 (L) 3.87 - 5.11 MIL/uL   Retic Count, Absolute 111.6 19.0 - 186.0 K/uL   Immature Retic Fract 55.3 (H) 2.3 - 15.9 %    Comment: Performed at [facility], [address], [place], [place] [address]  Folate     Status: None   Collection Time: 02/10/20  6:56 AM  Result Value Ref Range   Folate 14.3 >5.9 ng/mL    Comment: Performed at [facility], [address] [place], [place] [address]  Iron and TIBC     Status: None   Collection Time: 02/10/20  6:56 AM  Result Value Ref Range   Iron 44 28 - 170 ug/dL   TIBC 304 250 - 450 ug/dL   Saturation Ratios 15 10.4 - 31.8 %   UIBC 260 ug/dL    Comment: Performed at [facility], [address] [place], [place] [address]  Ferritin     Status: None   Collection Time: 02/10/20  6:56 AM  Result Value Ref Range   Ferritin 129 11 - 307 ng/mL    Comment: Performed at [facility], [address] [place],  [address]  Glucose, capillary     Status: Abnormal   Collection Time: 02/10/20  7:29 AM  Result Value Ref Range   Glucose-Capillary 145 (H) 70 - 99 mg/dL    Comment: Glucose reference range applies only to samples taken after fasting for at least 8 hours.  Glucose, capillary     Status: Abnormal  Collection Time: 02/10/20 11:39 AM  Result Value Ref Range   Glucose-Capillary 290 (H) 70 - 99 mg/dL    Comment: Glucose reference range applies only to samples taken after fasting for at least 8 hours.  Hemoglobin and hematocrit, blood     Status: Abnormal   Collection Time: 02/10/20 12:48 PM   Result Value Ref Range   Hemoglobin 8.8 (L) 12.0 - 15.0 g/dL   HCT 26.8 (L) 36.0 - 46.0 %    Comment: Performed at [facility], [address] [place], [place] [address]  Glucose, capillary     Status: Abnormal   Collection Time: 02/10/20  4:05 PM  Result Value Ref Range   Glucose-Capillary 315 (H) 70 - 99 mg/dL    Comment: Glucose reference range applies only to samples taken after fasting for at least 8 hours.  CBC With Differential     Status: Abnormal   Collection Time: 02/17/20  9:31 AM  Result Value Ref Range   WBC 10.0 3.4 - 10.8 x10E3/uL   RBC 3.05 (L) 3.77 - 5.28 x10E6/uL   Hemoglobin 9.2 (L) 11.1 - 15.9 g/dL   Hematocrit 28.7 (L) 34.0 - 46.6 %   MCV 94 79 - 97 fL   MCH 30.2 26.6 - 33.0 pg   MCHC 32.1 31.5 - 35.7 g/dL   RDW 17.5 (H) 11.7 - 15.4 %   Neutrophils 64 Not Estab. %   Lymphs 24 Not Estab. %   Monocytes 10 Not Estab. %   Eos 2 Not Estab. %   Basos 0 Not Estab. %   Neutrophils Absolute 6.4 1.4 - 7.0 x10E3/uL   Lymphocytes Absolute 2.4 0.7 - 3.1 x10E3/uL   Monocytes Absolute 1.0 (H) 0.1 - 0.9 x10E3/uL   EOS (ABSOLUTE) 0.2 0.0 - 0.4 x10E3/uL   Basophils Absolute 0.0 0.0 - 0.2 x10E3/uL   Immature Granulocytes 0 Not Estab. %   Immature Grans (Abs) 0.0 0.0 - 0.1 x10E3/uL  Vitamin B12     Status: None   Collection Time: 02/17/20  9:31 AM  Result Value Ref Range   Vitamin B-12 411 232 - 1,245 pg/mL  Folate     Status: None   Collection Time: 02/17/20  9:31 AM  Result Value Ref Range   Folate 11.8 >3.0 ng/mL    Comment: A serum folate concentration of less than 3.1 ng/mL is considered to represent clinical deficiency.   Iron and TIBC     Status: Abnormal   Collection Time: 02/17/20  9:31 AM  Result Value Ref Range   Total Iron Binding Capacity 305 250 - 450 ug/dL   UIBC 276 131 - 425 ug/dL   Iron 29 27 - 159 ug/dL   Iron Saturation 10 (L) 15 - 55 %  Ferritin     Status: None   Collection Time: 02/17/20  9:31 AM  Result Value Ref Range    Ferritin 134 15 - 150 ng/mL  Basic metabolic panel     Status: Abnormal   Collection Time: 02/17/20  9:31 AM  Result Value Ref Range   Glucose 105 (H) 65 - 99 mg/dL   BUN 16 6 - 24 mg/dL   Creatinine, Ser 1.11 (H) 0.57 - 1.00 mg/dL   GFR calc non Af Amer 54 (L) >59 mL/min/1.73   GFR calc Af Amer 63 >59 mL/min/1.73    Comment: **In accordance with recommendations from the NKF-ASN Task force,**   [facility] is in the process of updating its eGFR calculation to  the   2021 CKD-EPI creatinine equation that estimates kidney function   without a race variable.    BUN/Creatinine Ratio 14 9 - 23   Sodium 142 134 - 144 mmol/L   Potassium 4.4 3.5 - 5.2 mmol/L   Chloride 102 96 - 106 mmol/L   CO2 26 20 - 29 mmol/L   Calcium 9.1 8.7 - 10.2 mg/dL  POCT INR     Status: Abnormal   Collection Time: 02/25/20  9:14 AM  Result Value Ref Range   INR 1.6 (A) 2.0 - 3.0  POCT INR     Status: None   Collection Time: 03/04/20  9:09 AM  Result Value Ref Range   INR 2.2 2.0 - 3.0  Basic metabolic panel     Status: Abnormal   Collection Time: 03/09/20  1:12 PM  Result Value Ref Range   Glucose 249 (H) 65 - 99 mg/dL   BUN 23 6 - 24 mg/dL   Creatinine, Ser 1.15 (H) 0.57 - 1.00 mg/dL   GFR calc non Af Amer 52 (L) >59 mL/min/1.73   GFR calc Af Amer 60 >59 mL/min/1.73    Comment: **In accordance with recommendations from the NKF-ASN Task force,**   [facility] is in the process of updating its eGFR calculation to the   2021 CKD-EPI creatinine equation that estimates kidney function   without a race variable.    BUN/Creatinine Ratio 20 9 - 23   Sodium 140 134 - 144 mmol/L   Potassium 4.3 3.5 - 5.2 mmol/L   Chloride 103 96 - 106 mmol/L   CO2 20 20 - 29 mmol/L   Calcium 9.3 8.7 - 10.2 mg/dL  CBC with Differential/Platelet     Status: Abnormal   Collection Time: 03/10/20 10:46 AM  Result Value Ref Range   WBC 11.5 (H) 3.4 - 10.8 x10E3/uL   RBC 3.83 3.77 - 5.28 x10E6/uL   Hemoglobin 12.0 11.1 - 15.9 g/dL    Hematocrit 37.5 34.0 - 46.6 %   MCV 98 (H) 79 - 97 fL   MCH 31.3 26.6 - 33.0 pg   MCHC 32.0 31.5 - 35.7 g/dL   RDW 16.0 (H) 11.7 - 15.4 %   Platelets 364 150 - 450 x10E3/uL   Neutrophils 87 Not Estab. %   Lymphs 8 Not Estab. %   Monocytes 4 Not Estab. %   Eos 1 Not Estab. %   Basos 0 Not Estab. %   Neutrophils Absolute 9.9 (H) 1.4 - 7.0 x10E3/uL   Lymphocytes Absolute 0.9 0.7 - 3.1 x10E3/uL   Monocytes Absolute 0.5 0.1 - 0.9 x10E3/uL   EOS (ABSOLUTE) 0.1 0.0 - 0.4 x10E3/uL   Basophils Absolute 0.0 0.0 - 0.2 x10E3/uL   Immature Granulocytes 0 Not Estab. %   Immature Grans (Abs) 0.0 0.0 - 0.1 x10E3/uL  POCT CBG (Fasting - Glucose)     Status: Abnormal   Collection Time: 03/10/20 11:00 AM  Result Value Ref Range   Glucose Fasting, POC 208 (A) 70 - 99 mg/dL  POCT INR     Status: Abnormal   Collection Time: 03/10/20 11:01 AM  Result Value Ref Range   INR 4.1 (A) 2.0 - 3.0    Comment: INR 4.1, PT 49.2  POCT INR     Status: Normal   Collection Time: 03/17/20 10:14 AM  Result Value Ref Range   INR 2.1 2.0 - 3.0   Prothrombin Time 25.5     Radiology: DG Chest 2 View  Result  Date: 03/05/2020 CLINICAL DATA:  Dyspnea and wheezing.  History of COPD. EXAM: CHEST - 2 VIEW COMPARISON:  02/07/2020 FINDINGS: The cardiac silhouette is borderline to mildly enlarged. Peribronchial thickening is similar to the prior study. No consolidative airspace opacity, overt pulmonary edema, pleural effusion, or pneumothorax is identified. No acute osseous abnormality is seen. IMPRESSION: Bronchitic changes. Electronically Signed   By: Logan Bores M.D.   On: 03/05/2020 15:52    No results found.  No results found.    Assessment and Plan: Patient Active Problem List   Diagnosis Date Noted  . Acute upper GI bleeding 02/08/2020  . GIB (gastrointestinal bleeding) 02/07/2020  . HLD (hyperlipidemia) 02/07/2020  . Leukocytosis 02/07/2020  . Obesity, Class III, BMI 40-49.9 (morbid obesity) ([place])  02/07/2020  . Rheumatoid arthritis ([place])   . Symptomatic anemia   . Nausea vomiting and diarrhea   . Abscess of axilla, right 03/01/2019  . Pain in right upper arm 03/01/2019  . Type 2 diabetes mellitus with hyperglycemia ([place]) 03/01/2019  . Long term (current) use of systemic steroids 03/01/2019  . Long term (current) use of anticoagulants 01/17/2018  . Appendicitis with perforation   . Uncontrolled type 2 diabetes mellitus with hyperglycemia ([place]) 11/09/2017  . 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 Mucopurulent chronic bronchitis ([place]) 11/09/2017  . Personal history of venous thrombosis and embolism 10/06/2017  . Phlebitis and thrombophlebitis of other sites 06/09/2017  . Obstructive sleep apnea of adult 06/09/2017  . Gout, unspecified 06/09/2017  . Herpesviral vulvovaginitis 06/09/2017  . Type 2 diabetes mellitus with diabetic neuropathy, unspecified ([place]) 06/09/2017  . Essential (primary) hypertension 06/09/2017  . Chronic anticoagulation 06/09/2017  . Pain medication agreement signed 01/05/2017  . Subacromial bursitis of left shoulder joint 10/19/2016  . Chronic shoulder bursitis, right 06/21/2016  . Chronic pain of left knee 07/14/2014  . Myofascial muscle pain 07/14/2014  . Low back pain 03/18/2014  . Chronic obstructive pulmonary disease ([place]) 05/01/2013  . Upper respiratory infection 05/01/2013  . Acne 09/12/2012  . Hand dermatitis 09/12/2012  . DVT (deep venous thrombosis) ([place]) 08/31/2012  . Constipation 08/16/2012  . Esophageal reflux disease 08/16/2012  . Asthma 04/10/2012  . Tobacco use disorder 04/02/2012  . Morbid obesity ([place]) 03/01/2012  . Dermatitis 01/11/2012  . Depressive disorder 06/22/2010  . Type II diabetes mellitus with renal manifestations ([place]) 06/22/2010  . Encounter for current long-term use of anticoagulants 08/17/2009   1. Chronic obstructive pulmonary disease, unspecified COPD type ([place]) Requesting refills of albuterol inhaler, uses rarely, continue  with Breztri at this time Update PFT for review - albuterol (VENTOLIN HFA) 108 (90 Base) MCG/ACT inhaler; Use as prescribed  Dispense: 8.5 each; Refill: 3 - Pulmonary function test; Future  2. OSA (obstructive sleep apnea) History of OSA, risk factors include obesity, chronic pain, daytime fatigue Willing to retry CPAP therapy as we discussed the negative risk factors associated with untreated OSA - Home sleep test  3. Gastroesophageal reflux disease without esophagitis Symptoms remain stable at this time, continue to monitor  4. SOB (shortness of breath) FEV1 1.4L, 60% predicted - Spirometry with Graph   General Counseling: I have discussed the findings of the evaluation and examination with Judeen Hammans.  I have also discussed any further diagnostic evaluation thatmay be needed or ordered today. Shirely verbalizes understanding of the findings of todays visit. We also reviewed her medications today and discussed drug interactions and side effects including but not limited excessive drowsiness and altered mental states. We also discussed that there  is always a risk not just to her but also people around her. she has been encouraged to call the office with any questions or concerns that should arise related to todays visit.  Orders Placed This Encounter  Procedures  . Spirometry with Graph    Order Specific Question:   Where should this test be performed?    Answer:   [facility]    Order Specific Question:   Basic spirometry    Answer:   Yes     Time spent: 30  I have personally obtained a history, examined the patient, evaluated laboratory and imaging results, formulated the assessment and plan and placed orders. This patient was seen by Casey Burkitt AGNP-C in Collaboration with Dr. Devona Konig as a part of collaborative care agreement.    Allyne Gee, MD [facility] Sleep medicine

## 2020-04-10 ENCOUNTER — Encounter: Payer: Self-pay | Admitting: Internal Medicine

## 2020-04-10 NOTE — Patient Instructions (Signed)
Sleep Apnea Sleep apnea is a condition in which breathing pauses or becomes shallow during sleep. Episodes of sleep apnea usually last 10 seconds or longer, and they may occur as many as 20 times an hour. Sleep apnea disrupts your sleep and keeps your body from getting the rest that it needs. This condition can increase your risk of certain health problems, including:  Heart attack.  Stroke.  Obesity.  Diabetes.  Heart failure.  Irregular heartbeat. What are the causes? There are three kinds of sleep apnea:  Obstructive sleep apnea. This kind is caused by a blocked or collapsed airway.  Central sleep apnea. This kind happens when the part of the brain that controls breathing does not send the correct signals to the muscles that control breathing.  Mixed sleep apnea. This is a combination of obstructive and central sleep apnea. The most common cause of this condition is a collapsed or blocked airway. An airway can collapse or become blocked if:  Your throat muscles are abnormally relaxed.  Your tongue and tonsils are larger than normal.  You are overweight.  Your airway is smaller than normal. What increases the risk? You are more likely to develop this condition if you:  Are overweight.  Smoke.  Have a smaller than normal airway.  Are elderly.  Are female.  Drink alcohol.  Take sedatives or tranquilizers.  Have a family history of sleep apnea. What are the signs or symptoms? Symptoms of this condition include:  Trouble staying asleep.  Daytime sleepiness and tiredness.  Irritability.  Loud snoring.  Morning headaches.  Trouble concentrating.  Forgetfulness.  Decreased interest in sex.  Unexplained sleepiness.  Mood swings.  Personality changes.  Feelings of depression.  Waking up often during the night to urinate.  Dry mouth.  Sore throat. How is this diagnosed? This condition may be diagnosed with:  A medical history.  A physical  exam.  A series of tests that are done while you are sleeping (sleep study). These tests are usually done in a sleep lab, but they may also be done at home. How is this treated? Treatment for this condition aims to restore normal breathing and to ease symptoms during sleep. It may involve managing health issues that can affect breathing, such as high blood pressure or obesity. Treatment may include:  Sleeping on your side.  Using a decongestant if you have nasal congestion.  Avoiding the use of depressants, including alcohol, sedatives, and narcotics.  Losing weight if you are overweight.  Making changes to your diet.  Quitting smoking.  Using a device to open your airway while you sleep, such as: ? An oral appliance. This is a custom-made mouthpiece that shifts your lower jaw forward. ? A continuous positive airway pressure (CPAP) device. This device blows air through a mask when you breathe out (exhale). ? A nasal expiratory positive airway pressure (EPAP) device. This device has valves that you put into each nostril. ? A bi-level positive airway pressure (BPAP) device. This device blows air through a mask when you breathe in (inhale) and breathe out (exhale).  Having surgery if other treatments do not work. During surgery, excess tissue is removed to create a wider airway. It is important to get treatment for sleep apnea. Without treatment, this condition can lead to:  High blood pressure.  Coronary artery disease.  In men, an inability to achieve or maintain an erection (impotence).  Reduced thinking abilities. Follow these instructions at home: Lifestyle  Make any lifestyle changes   that your health care provider recommends.  Eat a healthy, well-balanced diet.  Take steps to lose weight if you are overweight.  Avoid using depressants, including alcohol, sedatives, and narcotics.  Do not use any products that contain nicotine or tobacco, such as cigarettes,  e-cigarettes, and chewing tobacco. If you need help quitting, ask your health care provider. General instructions  Take over-the-counter and prescription medicines only as told by your health care provider.  If you were given a device to open your airway while you sleep, use it only as told by your health care provider.  If you are having surgery, make sure to tell your health care provider you have sleep apnea. You may need to bring your device with you.  Keep all follow-up visits as told by your health care provider. This is important. Contact a health care provider if:  The device that you received to open your airway during sleep is uncomfortable or does not seem to be working.  Your symptoms do not improve.  Your symptoms get worse. Get help right away if:  You develop: ? Chest pain. ? Shortness of breath. ? Discomfort in your back, arms, or stomach.  You have: ? Trouble speaking. ? Weakness on one side of your body. ? Drooping in your face. These symptoms may represent a serious problem that is an emergency. Do not wait to see if the symptoms will go away. Get medical help right away. Call your local emergency services (911 in the [place] Do not drive yourself to the hospital. Summary  Sleep apnea is a condition in which breathing pauses or becomes shallow during sleep.  The most common cause is a collapsed or blocked airway.  The goal of treatment is to restore normal breathing and to ease symptoms during sleep. This information is not intended to replace advice given to you by your health care provider. Make sure you discuss any questions you have with your health care provider. Document Revised: 10/03/2018 Document Reviewed: 12/12/2017 Elsevier Patient Education  2020 [employer]  

## 2020-04-13 ENCOUNTER — Ambulatory Visit: Payer: Medicare Other | Admitting: Internal Medicine

## 2020-04-16 ENCOUNTER — Ambulatory Visit: Payer: Medicare Other | Admitting: Hospice and Palliative Medicine

## 2020-04-19 ENCOUNTER — Other Ambulatory Visit: Payer: Self-pay | Admitting: Internal Medicine

## 2020-04-19 ENCOUNTER — Other Ambulatory Visit: Payer: Self-pay | Admitting: Adult Health

## 2020-04-21 ENCOUNTER — Ambulatory Visit
Admission: RE | Admit: 2020-04-21 | Discharge: 2020-04-21 | Disposition: A | Discharge status: Home / Self Care | Payer: Medicare Other | Attending: Hospice and Palliative Medicine | Admitting: Hospice and Palliative Medicine

## 2020-04-21 ENCOUNTER — Ambulatory Visit (INDEPENDENT_AMBULATORY_CARE_PROVIDER_SITE_OTHER): Payer: Medicaid Other | Admitting: Hospice and Palliative Medicine

## 2020-04-21 ENCOUNTER — Encounter: Payer: Self-pay | Admitting: Hospice and Palliative Medicine

## 2020-04-21 ENCOUNTER — Ambulatory Visit
Admission: RE | Admit: 2020-04-21 | Discharge: 2020-04-21 | Disposition: A | Discharge status: Home / Self Care | Payer: Medicare Other | Source: Ambulatory Visit | Attending: Hospice and Palliative Medicine | Admitting: Hospice and Palliative Medicine

## 2020-04-21 ENCOUNTER — Other Ambulatory Visit: Payer: Self-pay

## 2020-04-21 VITALS — BP 96/68 | HR 90 | Temp 97.9°F | Resp 16 | Ht 66.0 in | Wt 268.6 lb

## 2020-04-21 DIAGNOSIS — R1031 Right lower quadrant pain: Secondary | ICD-10-CM

## 2020-04-21 DIAGNOSIS — Z0001 Encounter for general adult medical examination with abnormal findings: Secondary | ICD-10-CM

## 2020-04-21 DIAGNOSIS — E1165 Type 2 diabetes mellitus with hyperglycemia: Secondary | ICD-10-CM

## 2020-04-21 DIAGNOSIS — Z01419 Encounter for gynecological examination (general) (routine) without abnormal findings: Secondary | ICD-10-CM

## 2020-04-21 DIAGNOSIS — R300 Dysuria: Secondary | ICD-10-CM

## 2020-04-21 DIAGNOSIS — G4733 Obstructive sleep apnea (adult) (pediatric): Secondary | ICD-10-CM

## 2020-04-21 DIAGNOSIS — Z7901 Long term (current) use of anticoagulants: Secondary | ICD-10-CM | POA: Diagnosis not present

## 2020-04-21 DIAGNOSIS — R600 Localized edema: Secondary | ICD-10-CM | POA: Diagnosis not present

## 2020-04-21 DIAGNOSIS — L732 Hidradenitis suppurativa: Secondary | ICD-10-CM

## 2020-04-21 LAB — POCT GLYCOSYLATED HEMOGLOBIN (HGB A1C): Hemoglobin A1C: 8.1 % — AB (ref 4.0–5.6)

## 2020-04-21 LAB — POCT INR
INR: 1.6 — AB (ref 2.0–3.0)
Prothrombin Time: 18.6

## 2020-04-21 MED ORDER — APIXABAN 5 MG PO TABS: 5.0000 mg | ORAL_TABLET | Freq: Two times a day (BID) | ORAL | 1 refills | Status: DC

## 2020-04-21 MED ORDER — FUROSEMIDE 20 MG PO TABS
20.0000 mg | ORAL_TABLET | Freq: Every day | ORAL | 3 refills | Status: DC
Start: 2020-04-21 — End: 2020-07-06

## 2020-04-21 MED ORDER — DOXYCYCLINE HYCLATE 100 MG PO TABS: 100.0000 mg | ORAL_TABLET | Freq: Two times a day (BID) | ORAL | 2 refills | Status: DC

## 2020-04-21 NOTE — Progress Notes (Signed)
[facility] [place], [place] [address]  Internal MEDICINE  Office Visit Note  Patient Name: Brenda Santana  [id number]  [id number]  Date of Service: 04/27/2020  Chief Complaint  Patient presents with  . medicare wellness visit  . Quality Metric Gaps    Hep C screen, Tdap, Foot exam  . 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, hospitalized in October for GI bleed, struggle to keep INR within normal limits as she frequently misses doses of medication Patient complaining of right sided abdomnial pain that has been ongoing for 2 days, pain is some better today but she is fatigued and has a lack of energy  Followed by pain management for chronic knee pain, treated with gabapentin as well as baclofen, weaned off of Tramadol due to break in contract with pain management Also followed by ortho for lumbar radiculopathy--has received epidural injections which have provided significant pain relief  Discussed that her insurance has denied a home sleep study but would recommend proceeding with PSG in lab  Rarely monitors her glucose levels at home, when she does they typically average 160-180, will occasionally miss doses of her diabetic medications  Complaining today of lower quadrant abdominal pain, started about 2 days ago, dull aching pain, she is also complaining of body aches and fatigue, noted lower BP  PHM: Mammogram completed 06/2019, normal, repeat in 1 year Colonoscopy completed while hospitalized, normal, repeat in 2031   Current Medication: Outpatient Encounter Medications as of 04/21/2020  Medication Sig  . Accu-Chek FastClix Lancets MISC Use as directed once a daily diag E11.9  . acetaminophen-codeine (TYLENOL #3) 300-30 MG tablet  Take one tab po bid for chronic pain  . acyclovir (ZOVIRAX) 400 MG tablet TAKE 1 TABLET BY MOUTH TWICE A DAY (Patient taking differently: Take 400 mg by mouth 2 (two) times daily.)  . acyclovir ointment (ZOVIRAX) 5 % APPLY TOPICALLY EVERY 3 (THREE) HOURS.  Marland Kitchen albuterol (VENTOLIN HFA) 108 (90 Base) MCG/ACT inhaler Use as prescribed  . allopurinol (ZYLOPRIM) 300 MG tablet TAKE ONE TAB AT NIGHT FOR GOUT (Patient taking differently: Take 300 mg by mouth at bedtime.)  . aspirin EC 81 MG tablet Take 81 mg by mouth daily.  Marland Kitchen atorvastatin (LIPITOR) 10 MG tablet TAKE 1 TABLET BY MOUTH EVERY DAY (Patient taking differently: Take 10 mg by mouth daily.)  . baclofen (LIORESAL) 10 MG tablet Take 1 tablet (10 mg total) by mouth 3 (three) times daily.  . BD PEN NEEDLE NANO U/F 32G X 4 MM MISC USE DAILY WITH VICTOZA  . Biotin 5 MG CAPS Take 5 mg by mouth daily.   . cetirizine (ZYRTEC ALLERGY) 10 MG tablet Take 1 tablet (10 mg total) by mouth daily as needed for allergies.  . clobetasol cream (TEMOVATE) 5.05 % Apply 1 application topically 2 (two) times daily as needed (for eczema on hands).   . dapagliflozin propanediol (FARXIGA) 10 MG TABS tablet Take one tab po qd for dm  . diclofenac sodium (VOLTAREN) 1 % GEL APPLY 4 G TOPICALLY FOUR (4) TIMES A DAY.  Marland Kitchen docusate sodium (COLACE) 100 MG capsule Take 100 mg by mouth daily.   . Ferrous Sulfate (IRON) 325 (65 Fe) MG TABS Take 325 mg by  mouth 3 (three) times daily.   . Fluticasone-Umeclidin-Vilant (TRELEGY ELLIPTA) 100-62.5-25 MCG/INH AEPB Take one inhalation a day  . gabapentin (NEURONTIN) 800 MG tablet Take 1 tablet (800 mg total) by mouth 3 (three) times daily.  Marland Kitchen glucose blood (ACCU-CHEK GUIDE) test strip 1 each by Other route daily. Use as instructed  Once daily diag e11.9  . ipratropium-albuterol (DUONEB) 0.5-2.5 (3) MG/3ML SOLN Take 3 mLs by nebulization every 6 (six) hours as needed (for shortness of breath or wheezing).   Marland Kitchen lidocaine (XYLOCAINE) 5 % ointment  APPLY A SMALL AMOUNT TO AFFECTED AREA 2-3 TIMES A DAY  . linaclotide (LINZESS) 145 MCG CAPS capsule Take 1 capsule (145 mcg total) by mouth daily before breakfast.  . lisinopril (ZESTRIL) 10 MG tablet TAKE 1 TAB DAILY IN MORNING  . metoprolol tartrate (LOPRESSOR) 50 MG tablet TAKE 1 TABLET BY MOUTH TWICE A DAY  . montelukast (SINGULAIR) 10 MG tablet TAKE 1 TABLET BY MOUTH DAILY FOR ASTHMA  . ondansetron (ZOFRAN ODT) 4 MG disintegrating tablet Allow 1-2 tablets to dissolve in your mouth every 8 hours as needed for nausea/vomiting  . oxymetazoline (AFRIN) 0.05 % nasal spray Place 1 spray into both nostrils 2 (two) times daily as needed for congestion.  . pantoprazole (PROTONIX) 40 MG tablet Take 1 tablet (40 mg total) by mouth 2 (two) times daily.  . polyethylene glycol (MIRALAX / GLYCOLAX) 17 g packet Take one packets in one glass of water everyday  . predniSONE (DELTASONE) 10 MG tablet Take 1 tablet three times daily with a meal for 2 days followed by 1 tablet two days for 2 days. Then resume 10 mg daily as prescribed.  . traMADol (ULTRAM) 50 MG tablet Take 1 tablet (50 mg total) by mouth every 8 (eight) hours as needed for moderate pain.  Marland Kitchen tretinoin (RETIN-A) 0.025 % cream Apply 1 application topically at bedtime as needed (for eczema on face).   Marland Kitchen VICTOZA 18 MG/3ML SOPN INJECT 1.8 MG UNDER THE SKIN ONCE DAILY IN THE MORNING  . [DISCONTINUED] Budeson-Glycopyrrol-Formoterol (BREZTRI AEROSPHERE) 160-9-4.8 MCG/ACT AERO Inhale 2 puffs into the lungs in the morning and at bedtime.  . [DISCONTINUED] doxycycline (VIBRA-TABS) 100 MG tablet TAKE 1 TABLET BY MOUTH TWICE A DAY (Patient taking differently: Take 100 mg by mouth 2 (two) times daily.)  . [DISCONTINUED] furosemide (LASIX) 20 MG tablet Take 1 tablet (20 mg total) by mouth daily.  . [DISCONTINUED] warfarin (COUMADIN) 7.5 MG tablet Take 7.5 mg by mouth daily. Take 1 tab po daily except Saturday and Sunday take 2 tab po daily  . apixaban (ELIQUIS) 5  MG TABS tablet Take 1 tablet (5 mg total) by mouth 2 (two) times daily.  Marland Kitchen doxycycline (VIBRA-TABS) 100 MG tablet Take 1 tablet (100 mg total) by mouth 2 (two) times daily.  . furosemide (LASIX) 20 MG tablet Take 1 tablet (20 mg total) by mouth daily.   No facility-administered encounter medications on file as of 04/21/2020.    Surgical History: Past Surgical History:  Procedure Laterality Date  . APPENDECTOMY    . COLONOSCOPY WITH PROPOFOL N/A 02/02/2018   Procedure: COLONOSCOPY WITH PROPOFOL;  Surgeon: Jonathon Bellows, MD;  Location: [facility] ENDOSCOPY;  Service: Gastroenterology;  Laterality: N/A;  . ESOPHAGOGASTRODUODENOSCOPY (EGD) WITH PROPOFOL N/A 02/08/2020   Procedure: ESOPHAGOGASTRODUODENOSCOPY (EGD) WITH PROPOFOL;  Surgeon: Jonathon Bellows, MD;  Location: [facility] ENDOSCOPY;  Service: Gastroenterology;  Laterality: N/A;  . LAPAROSCOPIC APPENDECTOMY N/A 02/05/2018   Procedure: APPENDECTOMY LAPAROSCOPIC;  Surgeon: Jules Husbands,  MD;  Location: [facility] ORS;  Service: General;  Laterality: N/A;  . right arm surgery    . TUBAL LIGATION      Medical History: Past Medical History:  Diagnosis Date  . Asthma   . Atopic dermatitis   . Collagen vascular disease (HCC)    Rhematoid Arthritis  . Constipation   . COPD (chronic obstructive pulmonary disease) ([place])   . Diabetes mellitus without complication ([place])   . DVT (deep venous thrombosis) ([place])   . GERD (gastroesophageal reflux disease)   . Hyperlipidemia   . Hypertension   . Rheumatoid arthritis ([place])     Family History: Family History  Problem Relation Age of Onset  . Breast cancer Mother   . Hypertension Mother   . Diabetes Mother   . Hypertension Father   . Heart failure Father     Social History   Socioeconomic History  . Marital status: Single    Spouse name: Not on file  . Number of children: Not on file  . Years of education: Not on file  . Highest education level: Not on file  Occupational History  . Not on file  Tobacco  Use  . Smoking status: Former Research scientist (life sciences)  . Smokeless tobacco: Never Used  Vaping Use  . Vaping Use: Never used  Substance and Sexual Activity  . Alcohol use: No  . Drug use: No  . Sexual activity: Yes  Other Topics Concern  . Not on file  Social History Narrative  . Not on file   Social Determinants of Health   Financial Resource Strain: Not on file  Food Insecurity: Not on file  Transportation Needs: Not on file  Physical Activity: Not on file  Stress: Not on file  Social Connections: Not on file  Intimate Partner Violence: Not on file      Review of Systems  Constitutional: Positive for fatigue. Negative for chills and diaphoresis.  HENT: Negative for ear pain, postnasal drip and sinus pressure.   Eyes: Negative for photophobia, discharge, redness, itching and visual disturbance.  Respiratory: Negative for cough, shortness of breath and wheezing.   Cardiovascular: Negative for chest pain, palpitations and leg swelling.  Gastrointestinal: Positive for abdominal pain. Negative for constipation, diarrhea, nausea and vomiting.  Genitourinary: Negative for dysuria and flank pain.  Musculoskeletal: Negative for arthralgias, back pain, gait problem and neck pain.  Skin: Negative for color change.  Allergic/Immunologic: Negative for environmental allergies and food allergies.  Neurological: Negative for dizziness and headaches.  Hematological: Does not bruise/bleed easily.  Psychiatric/Behavioral: Negative for agitation, behavioral problems (depression) and hallucinations.    Vital Signs: BP 96/68   Pulse 90   Temp 97.9 F (36.6 C)   Resp 16   Ht 5\' 6"  (1.676 m)   Wt 268 lb 9.6 oz (121.8 kg)   SpO2 97%   BMI 43.35 kg/m    Physical Exam Vitals reviewed.  Constitutional:      Appearance: Normal appearance. She is well-developed. She is obese.  HENT:     Right Ear: Tympanic membrane normal.     Left Ear: Tympanic membrane normal.     Nose: Nose normal.      Mouth/Throat:     Mouth: Mucous membranes are moist.     Pharynx: Oropharynx is clear.  Eyes:     Pupils: Pupils are equal, round, and reactive to light.  Cardiovascular:     Rate and Rhythm: Normal rate and regular rhythm.     Pulses: Normal  pulses.     Heart sounds: Normal heart sounds.  Pulmonary:     Effort: Pulmonary effort is normal.     Breath sounds: Normal breath sounds.  Chest:  Breasts:     Right: Normal.     Left: Normal.    Abdominal:     General: Abdomen is flat.     Palpations: Abdomen is soft.     Tenderness: There is abdominal tenderness.  Musculoskeletal:        General: Normal range of motion.     Cervical back: Normal range of motion.  Skin:    General: Skin is warm.  Neurological:     General: No focal deficit present.     Mental Status: She is alert and oriented to person, place, and time. Mental status is at baseline.  Psychiatric:        Mood and Affect: Mood normal.        Behavior: Behavior normal.        Thought Content: Thought content normal.        Judgment: Judgment normal.     Assessment/Plan: 1. Encounter for routine adult health examination with abnormal findings Well appearing 59 year old [place] female, will review annual labs and adjust therapy as indicated Up to date on PHM - CBC w/Diff/Platelet - Comprehensive Metabolic Panel (CMET) - Lipid Panel With LDL/HDL Ratio - TSH + free T4  2. Uncontrolled type 2 diabetes mellitus with hyperglycemia (HCC) A1C 8.1 today, uncontrolled, due to lack of diet restrictions as well as missing medication doses, advised to adhere to all medication therapy and work on small changes in her diet Goal prior to next A1C check is to avoid all drinks containing sugar, will work with her to make small changes to improve glucose levels - POCT glycosylated hemoglobin (Hb A1C) - Comprehensive Metabolic Panel (CMET)  3. OSA (obstructive sleep apnea) Previous diagnosis of OSA, unable to tolerate CPAP at that  time, willing to retry therapy, risk factors include obesity, HTN and diastolic dysfunction - PSG Sleep Study; Future  4. Long term (current) use of anticoagulants INR 1.6 today, will stop warfarin therapy, start Eliquis for prophylaxis therapy - POCT INR - apixaban (ELIQUIS) 5 MG TABS tablet; Take 1 tablet (5 mg total) by mouth 2 (two) times daily.  Dispense: 60 tablet; Refill: 1 - CBC w/Diff/Platelet  5. Acute right lower quadrant pain Will obtain abdominal x-ray and adjust plan of care as needed - DG Abd 2 Views; Future  6. Bilateral lower extremity edema Edema remains well controlled, no evidence of edema on exam, continue with therapy, requesting refills today - furosemide (LASIX) 20 MG tablet; Take 1 tablet (20 mg total) by mouth daily.  Dispense: 30 tablet; Refill: 3  7. Axillary hidradenitis suppurativa Breakouts remain stable at this time of prophylactic therapy, continue with treatment, requesting refills today - doxycycline (VIBRA-TABS) 100 MG tablet; Take 1 tablet (100 mg total) by mouth 2 (two) times daily.  Dispense: 60 tablet; Refill: 2  8. Dysuria - UA/M w/rflx Culture, Routine - Microscopic Examination - Urine Culture, Reflex  General Counseling: Larua verbalizes understanding of the findings of todays visit and agrees with plan of treatment. I have discussed any further diagnostic evaluation that may be needed or ordered today. We also reviewed her medications today. she has been encouraged to call the office with any questions or concerns that should arise related to todays visit.    Orders Placed This Encounter  Procedures  . Microscopic  Examination  . Urine Culture, Reflex  . DG Abd 2 Views  . UA/M w/rflx Culture, Routine  . CBC w/Diff/Platelet  . Comprehensive Metabolic Panel (CMET)  . Lipid Panel With LDL/HDL Ratio  . TSH + free T4  . POCT glycosylated hemoglobin (Hb A1C)  . POCT INR  . PSG Sleep Study    Meds ordered this encounter  Medications   . apixaban (ELIQUIS) 5 MG TABS tablet    Sig: Take 1 tablet (5 mg total) by mouth 2 (two) times daily.    Dispense:  60 tablet    Refill:  1  . furosemide (LASIX) 20 MG tablet    Sig: Take 1 tablet (20 mg total) by mouth daily.    Dispense:  30 tablet    Refill:  3  . doxycycline (VIBRA-TABS) 100 MG tablet    Sig: Take 1 tablet (100 mg total) by mouth 2 (two) times daily.    Dispense:  60 tablet    Refill:  2    Time spent: 30 Minutes Time spent includes review of chart, medications, test results and follow-up plan with the patient.  This patient was seen by Theodoro Grist AGNP-C in Collaboration with Dr Lavera Guise as a part of collaborative care agreement     Tanna Furry. Carliss Quast AGNP-C Internal medicine

## 2020-04-22 ENCOUNTER — Ambulatory Visit: Payer: Medicare Other | Admitting: Internal Medicine

## 2020-04-22 ENCOUNTER — Telehealth: Payer: Self-pay

## 2020-04-22 LAB — LIPID PANEL WITH LDL/HDL RATIO
Cholesterol, Total: 161 mg/dL (ref 100–199)
HDL: 60 mg/dL (ref >39)
LDL Chol Calc (NIH): 87 mg/dL (ref 0–99)
LDL/HDL Ratio: 1.5 ratio (ref 0.0–3.2)
Triglycerides: 72 mg/dL (ref 0–149)
VLDL Cholesterol Cal: 14 mg/dL (ref 5–40)

## 2020-04-22 LAB — CBC WITH DIFFERENTIAL/PLATELET
Basophils Absolute: 0 10*3/uL (ref 0.0–0.2)
Basos: 0 %
EOS (ABSOLUTE): 0 10*3/uL (ref 0.0–0.4)
Eos: 0 %
Hematocrit: 42 % (ref 34.0–46.6)
Hemoglobin: 13.7 g/dL (ref 11.1–15.9)
Immature Grans (Abs): 0 10*3/uL (ref 0.0–0.1)
Immature Granulocytes: 0 %
Lymphocytes Absolute: 1.5 10*3/uL (ref 0.7–3.1)
Lymphs: 9 %
MCH: 31.1 pg (ref 26.6–33.0)
MCHC: 32.6 g/dL (ref 31.5–35.7)
MCV: 95 fL (ref 79–97)
Monocytes Absolute: 1.2 10*3/uL — ABNORMAL HIGH (ref 0.1–0.9)
Monocytes: 7 %
Neutrophils Absolute: 14.3 10*3/uL — ABNORMAL HIGH (ref 1.4–7.0)
Neutrophils: 84 %
Platelets: 333 10*3/uL (ref 150–450)
RBC: 4.41 x10E6/uL (ref 3.77–5.28)
RDW: 14 % (ref 11.7–15.4)
WBC: 17.2 10*3/uL — ABNORMAL HIGH (ref 3.4–10.8)

## 2020-04-22 LAB — COMPREHENSIVE METABOLIC PANEL
ALT: 19 IU/L (ref 0–32)
AST: 21 IU/L (ref 0–40)
Albumin/Globulin Ratio: 1.4 (ref 1.2–2.2)
Albumin: 4.1 g/dL (ref 3.8–4.9)
Alkaline Phosphatase: 142 IU/L — ABNORMAL HIGH (ref 44–121)
BUN/Creatinine Ratio: 11 (ref 9–23)
BUN: 11 mg/dL (ref 6–24)
Bilirubin Total: 0.3 mg/dL (ref 0.0–1.2)
CO2: 22 mmol/L (ref 20–29)
Calcium: 9.7 mg/dL (ref 8.7–10.2)
Chloride: 103 mmol/L (ref 96–106)
Creatinine, Ser: 0.99 mg/dL (ref 0.57–1.00)
GFR calc Af Amer: 72 mL/min/{1.73_m2} (ref >59)
GFR calc non Af Amer: 63 mL/min/{1.73_m2} (ref >59)
Globulin, Total: 3 g/dL (ref 1.5–4.5)
Glucose: 151 mg/dL — ABNORMAL HIGH (ref 65–99)
Potassium: 4.4 mmol/L (ref 3.5–5.2)
Sodium: 141 mmol/L (ref 134–144)
Total Protein: 7.1 g/dL (ref 6.0–8.5)

## 2020-04-22 LAB — TSH+FREE T4
Free T4: 1.16 ng/dL (ref 0.82–1.77)
TSH: 0.656 u[IU]/mL (ref 0.450–4.500)

## 2020-04-22 NOTE — Telephone Encounter (Signed)
Pt advised abdomen xray result by taylor

## 2020-04-23 ENCOUNTER — Telehealth: Payer: Self-pay

## 2020-04-23 NOTE — Telephone Encounter (Signed)
All documents for PSG signed and placed in FG folder behind Tat for pick up

## 2020-04-24 LAB — URINE CULTURE, REFLEX

## 2020-04-24 LAB — UA/M W/RFLX CULTURE, ROUTINE
Bilirubin, UA: NEGATIVE
Ketones, UA: NEGATIVE
Nitrite, UA: NEGATIVE
Protein,UA: NEGATIVE
RBC, UA: NEGATIVE
Specific Gravity, UA: 1.028 (ref 1.005–1.030)
Urobilinogen, Ur: 0.2 mg/dL (ref 0.2–1.0)
pH, UA: 6 (ref 5.0–7.5)

## 2020-04-24 LAB — MICROSCOPIC EXAMINATION
Bacteria, UA: NONE SEEN
Casts: NONE SEEN /lpf
Epithelial Cells (non renal): >10 /hpf — AB (ref 0–10)
RBC, Urine: NONE SEEN /hpf (ref 0–2)

## 2020-04-27 ENCOUNTER — Encounter: Payer: Self-pay | Admitting: Hospice and Palliative Medicine

## 2020-04-28 ENCOUNTER — Other Ambulatory Visit: Payer: Self-pay | Admitting: Internal Medicine

## 2020-04-28 ENCOUNTER — Telehealth: Payer: Self-pay

## 2020-04-28 DIAGNOSIS — M5442 Lumbago with sciatica, left side: Secondary | ICD-10-CM

## 2020-04-28 NOTE — Telephone Encounter (Signed)
Completed medical record request and mailed requested records to [facility] attention:Kerri [address] [place] [address]. Faxed Medical record payment request to Attention Kerri at [phone].

## 2020-04-30 ENCOUNTER — Other Ambulatory Visit: Payer: Self-pay | Admitting: Adult Health

## 2020-05-09 ENCOUNTER — Other Ambulatory Visit: Payer: Self-pay | Admitting: Internal Medicine

## 2020-05-09 ENCOUNTER — Other Ambulatory Visit: Payer: Self-pay | Admitting: Hospice and Palliative Medicine

## 2020-05-09 DIAGNOSIS — J449 Chronic obstructive pulmonary disease, unspecified: Secondary | ICD-10-CM

## 2020-05-11 ENCOUNTER — Other Ambulatory Visit: Payer: Self-pay | Admitting: Adult Health

## 2020-05-11 ENCOUNTER — Other Ambulatory Visit: Payer: Self-pay | Admitting: Internal Medicine

## 2020-05-11 DIAGNOSIS — M0579 Rheumatoid arthritis with rheumatoid factor of multiple sites without organ or systems involvement: Secondary | ICD-10-CM

## 2020-05-12 ENCOUNTER — Other Ambulatory Visit: Payer: Self-pay | Admitting: Adult Health

## 2020-05-14 ENCOUNTER — Other Ambulatory Visit: Payer: Self-pay | Admitting: Adult Health

## 2020-05-20 ENCOUNTER — Other Ambulatory Visit: Payer: Self-pay

## 2020-05-20 ENCOUNTER — Telehealth: Payer: Self-pay

## 2020-05-20 ENCOUNTER — Other Ambulatory Visit: Payer: Self-pay | Admitting: Adult Health

## 2020-05-20 MED ORDER — LISINOPRIL 10 MG PO TABS: 10.0000 mg | ORAL_TABLET | Freq: Every day | ORAL | 1 refills | Status: DC

## 2020-05-20 NOTE — Telephone Encounter (Signed)
Unable to [facility], pt has a sleep study scheduled on 05-25-20 and needs a F/U appt.

## 2020-05-20 NOTE — Telephone Encounter (Signed)
Tried reaching pt several times to reschedule PFT, unable to reach pt. Brenda Santana

## 2020-05-25 ENCOUNTER — Other Ambulatory Visit: Payer: Self-pay | Admitting: Internal Medicine

## 2020-05-25 ENCOUNTER — Other Ambulatory Visit: Payer: Self-pay

## 2020-05-25 ENCOUNTER — Telehealth: Payer: Self-pay

## 2020-05-25 DIAGNOSIS — M5440 Lumbago with sciatica, unspecified side: Secondary | ICD-10-CM

## 2020-05-25 MED ORDER — PREDNISONE 10 MG PO TABS: ORAL_TABLET | ORAL | 0 refills | Status: DC

## 2020-05-25 NOTE — Telephone Encounter (Signed)
She is not due to get T# 3 until 1/28

## 2020-05-26 ENCOUNTER — Other Ambulatory Visit: Payer: Self-pay | Admitting: Internal Medicine

## 2020-05-26 ENCOUNTER — Other Ambulatory Visit: Payer: Self-pay | Admitting: Adult Health

## 2020-05-26 DIAGNOSIS — G8929 Other chronic pain: Secondary | ICD-10-CM

## 2020-05-26 DIAGNOSIS — M5442 Lumbago with sciatica, left side: Secondary | ICD-10-CM

## 2020-05-27 ENCOUNTER — Ambulatory Visit: Payer: Medicare Other | Admitting: Internal Medicine

## 2020-05-28 ENCOUNTER — Other Ambulatory Visit: Payer: Self-pay

## 2020-05-28 MED ORDER — MONTELUKAST SODIUM 10 MG PO TABS: ORAL_TABLET | ORAL | 1 refills | Status: DC

## 2020-05-29 ENCOUNTER — Other Ambulatory Visit: Payer: Self-pay | Admitting: Hospice and Palliative Medicine

## 2020-05-29 ENCOUNTER — Other Ambulatory Visit: Payer: Self-pay

## 2020-05-29 MED ORDER — MONTELUKAST SODIUM 10 MG PO TABS: ORAL_TABLET | ORAL | 1 refills | Status: DC

## 2020-06-03 ENCOUNTER — Ambulatory Visit: Payer: Self-pay | Admitting: Hospice and Palliative Medicine

## 2020-06-09 ENCOUNTER — Other Ambulatory Visit: Payer: Self-pay | Admitting: Hospice and Palliative Medicine

## 2020-06-09 DIAGNOSIS — Z7901 Long term (current) use of anticoagulants: Secondary | ICD-10-CM

## 2020-06-11 ENCOUNTER — Other Ambulatory Visit: Payer: Self-pay

## 2020-06-11 ENCOUNTER — Other Ambulatory Visit
Admission: RE | Admit: 2020-06-11 | Discharge: 2020-06-11 | Disposition: A | Discharge status: Home / Self Care | Payer: 59 | Source: Ambulatory Visit | Attending: Gastroenterology | Admitting: Gastroenterology

## 2020-06-11 DIAGNOSIS — Z20822 Contact with and (suspected) exposure to covid-19: Secondary | ICD-10-CM | POA: Insufficient documentation

## 2020-06-11 DIAGNOSIS — Z01812 Encounter for preprocedural laboratory examination: Secondary | ICD-10-CM | POA: Insufficient documentation

## 2020-06-11 LAB — SARS CORONAVIRUS 2 (TAT 6-24 HRS): SARS Coronavirus 2: NEGATIVE

## 2020-06-15 ENCOUNTER — Encounter: Payer: Self-pay | Admitting: *Deleted

## 2020-06-15 ENCOUNTER — Ambulatory Visit
Admission: RE | Admit: 2020-06-15 | Discharge: 2020-06-15 | Disposition: A | Discharge status: Home / Self Care | Payer: 59 | Attending: Gastroenterology | Admitting: Gastroenterology

## 2020-06-15 ENCOUNTER — Ambulatory Visit: Payer: 59 | Admitting: Anesthesiology

## 2020-06-15 ENCOUNTER — Encounter
Admission: RE | Disposition: A | Discharge status: Home / Self Care | Payer: Self-pay | Source: Home / Self Care | Attending: Gastroenterology

## 2020-06-15 ENCOUNTER — Other Ambulatory Visit: Payer: Self-pay

## 2020-06-15 DIAGNOSIS — E119 Type 2 diabetes mellitus without complications: Secondary | ICD-10-CM | POA: Diagnosis not present

## 2020-06-15 DIAGNOSIS — Z7901 Long term (current) use of anticoagulants: Secondary | ICD-10-CM | POA: Diagnosis not present

## 2020-06-15 DIAGNOSIS — I10 Essential (primary) hypertension: Secondary | ICD-10-CM | POA: Diagnosis not present

## 2020-06-15 DIAGNOSIS — E785 Hyperlipidemia, unspecified: Secondary | ICD-10-CM | POA: Diagnosis not present

## 2020-06-15 DIAGNOSIS — K2950 Unspecified chronic gastritis without bleeding: Secondary | ICD-10-CM | POA: Diagnosis not present

## 2020-06-15 DIAGNOSIS — Z86718 Personal history of other venous thrombosis and embolism: Secondary | ICD-10-CM | POA: Diagnosis not present

## 2020-06-15 DIAGNOSIS — Z09 Encounter for follow-up examination after completed treatment for conditions other than malignant neoplasm: Secondary | ICD-10-CM | POA: Insufficient documentation

## 2020-06-15 DIAGNOSIS — Z7982 Long term (current) use of aspirin: Secondary | ICD-10-CM | POA: Insufficient documentation

## 2020-06-15 DIAGNOSIS — Z7984 Long term (current) use of oral hypoglycemic drugs: Secondary | ICD-10-CM | POA: Diagnosis not present

## 2020-06-15 DIAGNOSIS — Z79899 Other long term (current) drug therapy: Secondary | ICD-10-CM | POA: Diagnosis not present

## 2020-06-15 DIAGNOSIS — Z8711 Personal history of peptic ulcer disease: Secondary | ICD-10-CM | POA: Diagnosis not present

## 2020-06-15 HISTORY — DX: Myoneural disorder, unspecified: G70.9

## 2020-06-15 HISTORY — PX: ESOPHAGOGASTRODUODENOSCOPY (EGD) WITH PROPOFOL: SHX5813

## 2020-06-15 LAB — GLUCOSE, CAPILLARY: Glucose-Capillary: 138 mg/dL — ABNORMAL HIGH (ref 70–99)

## 2020-06-15 SURGERY — ESOPHAGOGASTRODUODENOSCOPY (EGD) WITH PROPOFOL
Anesthesia: General

## 2020-06-15 MED ORDER — LIDOCAINE HCL (PF) 2 % IJ SOLN
INTRAMUSCULAR | Status: DC | PRN
  Administered 2020-06-15: 80 mg

## 2020-06-15 MED ORDER — FENTANYL CITRATE (PF) 100 MCG/2ML IJ SOLN
INTRAMUSCULAR | Status: AC
  Filled 2020-06-15: qty 2

## 2020-06-15 MED ORDER — MIDAZOLAM HCL 2 MG/2ML IJ SOLN
INTRAMUSCULAR | Status: AC
  Filled 2020-06-15: qty 2

## 2020-06-15 MED ORDER — LIDOCAINE HCL (PF) 2 % IJ SOLN
INTRAMUSCULAR | Status: AC
  Filled 2020-06-15: qty 5

## 2020-06-15 MED ORDER — MIDAZOLAM HCL 5 MG/5ML IJ SOLN
INTRAMUSCULAR | Status: DC | PRN
  Administered 2020-06-15: 2 mg via INTRAVENOUS

## 2020-06-15 MED ORDER — SODIUM CHLORIDE 0.9 % IV SOLN: INTRAVENOUS | Status: DC

## 2020-06-15 MED ORDER — PROPOFOL 10 MG/ML IV BOLUS
INTRAVENOUS | Status: DC | PRN
  Administered 2020-06-15: 20 mg via INTRAVENOUS
  Administered 2020-06-15: 30 mg via INTRAVENOUS

## 2020-06-15 MED ORDER — FENTANYL CITRATE (PF) 100 MCG/2ML IJ SOLN
INTRAMUSCULAR | Status: DC | PRN
  Administered 2020-06-15 (×2): 50 ug via INTRAVENOUS

## 2020-06-15 MED ORDER — PROPOFOL 500 MG/50ML IV EMUL
INTRAVENOUS | Status: DC | PRN
  Administered 2020-06-15: 50 ug/kg/min via INTRAVENOUS

## 2020-06-15 MED ORDER — PROPOFOL 500 MG/50ML IV EMUL
INTRAVENOUS | Status: AC
  Filled 2020-06-15: qty 50

## 2020-06-15 NOTE — Anesthesia Preprocedure Evaluation (Signed)
Anesthesia Evaluation  Patient identified by MRN, date of birth, ID band Patient awake    Reviewed: Allergy & Precautions, H&P , NPO status , Patient's Chart, lab work & pertinent test results  History of Anesthesia Complications Negative for: history of anesthetic complications  Airway Mallampati: III  TM Distance: >3 FB Neck ROM: full    Dental  (+) Missing, Dental Advidsory Given   Pulmonary neg shortness of breath, asthma , sleep apnea , COPD,  COPD inhaler, neg recent URI, former smoker,    breath sounds clear to auscultation       Cardiovascular hypertension, (-) angina+ DVT  (-) Past MI and (-) Cardiac Stents (-) dysrhythmias (-) Valvular Problems/Murmurs Rhythm:regular Rate:Normal  H/o DVT on chronic anticoagulation   Neuro/Psych PSYCHIATRIC DISORDERS Depression negative neurological ROS     GI/Hepatic Neg liver ROS, GERD  ,  Endo/Other  diabetesMorbid obesity  Renal/GU Renal disease  negative genitourinary   Musculoskeletal  (+) Arthritis ,   Abdominal   Peds  Hematology  (+) Blood dyscrasia, anemia ,   Anesthesia Other Findings Past Medical History: No date: Asthma No date: Atopic dermatitis No date: Constipation No date: COPD (chronic obstructive pulmonary disease) (HCC) No date: Diabetes mellitus without complication (HCC) No date: DVT (deep venous thrombosis) (HCC) No date: GERD (gastroesophageal reflux disease) No date: Hyperlipidemia No date: Hypertension No date: Rheumatoid arthritis ([place])  Past Surgical History: No date: right arm surgery No date: TUBAL LIGATION  BMI    Body Mass Index:  39.11 kg/m      Reproductive/Obstetrics negative OB ROS                             Anesthesia Physical  Anesthesia Plan  ASA: III  Anesthesia Plan: General   Post-op Pain Management:    Induction: Intravenous  PONV Risk Score and Plan: Propofol infusion and  TIVA  Airway Management Planned: Natural Airway and Nasal Cannula  Additional Equipment:   Intra-op Plan:   Post-operative Plan:   Informed Consent: I have reviewed the patients History and Physical, chart, labs and discussed the procedure including the risks, benefits and alternatives for the proposed anesthesia with the patient or authorized representative who has indicated his/her understanding and acceptance.     Dental Advisory Given  Plan Discussed with: Anesthesiologist, CRNA and Surgeon  Anesthesia Plan Comments:         Anesthesia Quick Evaluation

## 2020-06-15 NOTE — H&P (Signed)
Outpatient short stay form Pre-procedure 06/15/2020 8:19 AM Brenda Miyamoto MD, MPH  Primary Physician: Dr. Humphrey Rolls  Reason for visit:  Hx of PUD  History of present illness:   60 y/o lady with history of Upper GI bleed due to NSAIDS here to assess healing of gastric ulcer and biopsies of stomach for H pylori. Takes eliquis with last dose being 3 days ago. No new GI symptoms. No further melena. Completed course of PPI.   No current facility-administered medications for this encounter.  Medications Prior to Admission  Medication Sig Dispense Refill Last Dose  . Acetaminophen-Codeine 300-30 MG tablet TAKE ONE TAB BY MOUTH TWICE A DAY FOR CHRONIC PAIN 60 tablet 0 06/15/2020 at 0500  . acyclovir (ZOVIRAX) 400 MG tablet TAKE 1 TABLET BY MOUTH TWICE A DAY (Patient taking differently: Take 400 mg by mouth 2 (two) times daily.) 180 tablet 1 06/14/2020 at Unknown time  . acyclovir ointment (ZOVIRAX) 5 % APPLY TOPICALLY EVERY 3 (THREE) HOURS. 60 g 1 Past Month at Unknown time  . albuterol (VENTOLIN HFA) 108 (90 Base) MCG/ACT inhaler INHALE 2 PUFFS INTO THE LUNGS EVERY 4 HOURS AS NEEDED FOR WHEEZE OR FOR SHORTNESS OF BREATH 17 each 1 06/14/2020 at Unknown time  . allopurinol (ZYLOPRIM) 300 MG tablet TAKE ONE TAB AT NIGHT FOR GOUT (Patient taking differently: Take 300 mg by mouth at bedtime.) 90 tablet 3 06/14/2020 at Unknown time  . aspirin EC 81 MG tablet Take 81 mg by mouth daily.   06/14/2020 at Unknown time  . atorvastatin (LIPITOR) 10 MG tablet TAKE 1 TABLET BY MOUTH EVERY DAY (Patient taking differently: Take 10 mg by mouth daily.) 90 tablet 1 06/14/2020 at Unknown time  . baclofen (LIORESAL) 10 MG tablet Take 1 tablet (10 mg total) by mouth 3 (three) times daily. 90 tablet 0 Past Week at Unknown time  . Biotin 5 MG CAPS Take 5 mg by mouth daily.    Past Month at Unknown time  . cetirizine (ZYRTEC ALLERGY) 10 MG tablet Take 1 tablet (10 mg total) by mouth daily as needed for allergies. 30 tablet 2  06/14/2020 at Unknown time  . dapagliflozin propanediol (FARXIGA) 10 MG TABS tablet Take one tab po qd for dm 90 tablet 3 06/14/2020 at Unknown time  . docusate sodium (COLACE) 100 MG capsule Take 100 mg by mouth daily.    06/14/2020 at Unknown time  . doxycycline (VIBRA-TABS) 100 MG tablet Take 1 tablet (100 mg total) by mouth 2 (two) times daily. 60 tablet 2 06/14/2020 at Unknown time  . Ferrous Sulfate (IRON) 325 (65 Fe) MG TABS Take 325 mg by mouth 3 (three) times daily.    06/14/2020 at Unknown time  . Fluticasone-Umeclidin-Vilant (TRELEGY ELLIPTA) 100-62.5-25 MCG/INH AEPB Take one inhalation a day 30 each 6 06/15/2020 at 0500  . furosemide (LASIX) 20 MG tablet Take 1 tablet (20 mg total) by mouth daily. 30 tablet 3 06/14/2020 at Unknown time  . gabapentin (NEURONTIN) 800 MG tablet Take 1 tablet (800 mg total) by mouth 3 (three) times daily. 90 tablet 2 Past Month at Unknown time  . linaclotide (LINZESS) 145 MCG CAPS capsule Take 1 capsule (145 mcg total) by mouth daily before breakfast. 90 capsule 1 06/14/2020 at Unknown time  . lisinopril (ZESTRIL) 10 MG tablet Take 1 tablet (10 mg total) by mouth daily. 90 tablet 1 06/14/2020 at Unknown time  . metoprolol tartrate (LOPRESSOR) 50 MG tablet TAKE 1 TABLET BY MOUTH TWICE A DAY 180 tablet  3 06/14/2020 at Unknown time  . montelukast (SINGULAIR) 10 MG tablet TAKE 1 TABLET BY MOUTH DAILY FOR ASTHMA 90 tablet 1 06/14/2020 at Unknown time  . oxymetazoline (AFRIN) 0.05 % nasal spray Place 1 spray into both nostrils 2 (two) times daily as needed for congestion.   06/15/2020 at 0500  . pantoprazole (PROTONIX) 40 MG tablet Take 1 tablet (40 mg total) by mouth 2 (two) times daily. 180 tablet 1 06/14/2020 at Unknown time  . predniSONE (DELTASONE) 10 MG tablet Take 2 tab po  daily 60 tablet 0 06/14/2020 at Unknown time  . traMADol (ULTRAM) 50 MG tablet Take 1 tablet (50 mg total) by mouth 3 (three) times daily. Take one tab po tid for pain 90 tablet 1 Past Month at Unknown  time  . VICTOZA 18 MG/3ML SOPN INJECT 1.8 MG UNDER THE SKIN ONCE DAILY IN THE MORNING 9 pen 6 06/14/2020 at Unknown time  . Accu-Chek FastClix Lancets MISC Use as directed once a daily diag E11.9 100 each 3   . BD PEN NEEDLE NANO U/F 32G X 4 MM MISC USE DAILY WITH VICTOZA 100 each 3   . clobetasol cream (TEMOVATE) 5.46 % Apply 1 application topically 2 (two) times daily as needed (for eczema on hands).  (Patient not taking: Reported on 06/15/2020)   Not Taking at Unknown time  . diclofenac sodium (VOLTAREN) 1 % GEL APPLY 4 G TOPICALLY FOUR (4) TIMES A DAY. (Patient not taking: Reported on 06/15/2020)  2 Not Taking at Unknown time  . ELIQUIS 5 MG TABS tablet TAKE 1 TABLET BY MOUTH TWICE A DAY 60 tablet 1 06/12/2020  . glucose blood (ACCU-CHEK GUIDE) test strip 1 each by Other route daily. Use as instructed  Once daily diag e11.9 100 each 1   . ipratropium-albuterol (DUONEB) 0.5-2.5 (3) MG/3ML SOLN Take 3 mLs by nebulization every 6 (six) hours as needed (for shortness of breath or wheezing).  (Patient not taking: Reported on 06/15/2020)   Not Taking at Unknown time  . lidocaine (XYLOCAINE) 5 % ointment APPLY A SMALL AMOUNT TO AFFECTED AREA 2-3 TIMES A DAY (Patient not taking: Reported on 06/15/2020) 35.44 g 1 Not Taking at Unknown time  . ondansetron (ZOFRAN ODT) 4 MG disintegrating tablet Allow 1-2 tablets to dissolve in your mouth every 8 hours as needed for nausea/vomiting (Patient not taking: Reported on 06/15/2020) 30 tablet 0 Not Taking at Unknown time  . polyethylene glycol (MIRALAX / GLYCOLAX) 17 g packet Take one packets in one glass of water everyday (Patient not taking: Reported on 06/15/2020) 100 each 3 Not Taking at Unknown time  . tretinoin (RETIN-A) 0.025 % cream Apply 1 application topically at bedtime as needed (for eczema on face).  (Patient not taking: Reported on 06/15/2020)  11 Not Taking at Unknown time     No Known Allergies   Past Medical History:  Diagnosis Date  . Asthma   .  Atopic dermatitis   . Collagen vascular disease (HCC)    Rhematoid Arthritis  . Constipation   . COPD (chronic obstructive pulmonary disease) ([place])   . Diabetes mellitus without complication ([place])   . DVT (deep venous thrombosis) ([place])   . GERD (gastroesophageal reflux disease)   . Hyperlipidemia   . Hypertension   . Rheumatoid arthritis ([place])     Review of systems:  Otherwise negative.    Physical Exam  Gen: Alert, oriented. Appears stated age.  HEENT: PERRLA. Lungs: No respiratory distress CV: RRR Abd: soft,  benign, no masses Ext: No edema    Planned procedures: Proceed with EGD. The patient understands the nature of the planned procedure, indications, risks, alternatives and potential complications including but not limited to bleeding, infection, perforation, damage to internal organs and possible oversedation/side effects from anesthesia. The patient agrees and gives consent to proceed.  Please refer to procedure notes for findings, recommendations and patient disposition/instructions.     Brenda Miyamoto MD, MPH Gastroenterology 06/15/2020  8:19 AM

## 2020-06-15 NOTE — Op Note (Signed)
[facility] Gastroenterology Patient Name: Brenda Santana Procedure Date: 06/15/2020 8:16 AM MRN: [id number] Account #: [id number] Date of Birth: Mar 30, 1961 Admit Type: Outpatient Age: 60 Room: [facility] ENDO ROOM 3 Gender: Female Note Status: Finalized Procedure:             Upper GI endoscopy Indications:           Personal history of peptic ulcer disease Providers:             Andrey Farmer MD, MD Referring MD:          Lavera Guise, MD (Referring MD) Medicines:             Monitored Anesthesia Care Complications:         No immediate complications. Estimated blood loss:                         Minimal. Procedure:             Pre-Anesthesia Assessment:                        - Prior to the procedure, a History and Physical was                         performed, and patient medications and allergies were                         reviewed. The patient is competent. The risks and                         benefits of the procedure and the sedation options and                         risks were discussed with the patient. All questions                         were answered and informed consent was obtained.                         Patient identification and proposed procedure were                         verified by the physician, the nurse, the anesthetist                         and the technician in the endoscopy suite. Mental                         Status Examination: alert and oriented. Airway                         Examination: normal oropharyngeal airway and neck                         mobility. Respiratory Examination: clear to                         auscultation. CV Examination: normal. Prophylactic  Antibiotics: The patient does not require prophylactic                         antibiotics. Prior Anticoagulants: The patient has                         taken Eliquis (apixaban), last dose was 3 days prior                         to  procedure. ASA Grade Assessment: III - A patient                         with severe systemic disease. After reviewing the                         risks and benefits, the patient was deemed in                         satisfactory condition to undergo the procedure. The                         anesthesia plan was to use monitored anesthesia care                         (MAC). Immediately prior to administration of                         medications, the patient was re-assessed for adequacy                         to receive sedatives. The heart rate, respiratory                         rate, oxygen saturations, blood pressure, adequacy of                         pulmonary ventilation, and response to care were                         monitored throughout the procedure. The physical                         status of the patient was re-assessed after the                         procedure.                        After obtaining informed consent, the endoscope was                         passed under direct vision. Throughout the procedure,                         the patient's blood pressure, pulse, and oxygen                         saturations were monitored continuously. The Endoscope  was introduced through the mouth, and advanced to the                         second part of duodenum. The upper GI endoscopy was                         accomplished without difficulty. The patient tolerated                         the procedure well. Findings:      The examined esophagus was normal.      The entire examined stomach was normal. Biopsies were taken with a cold       forceps for Helicobacter pylori testing. Estimated blood loss was       minimal.      The examined duodenum was normal. Impression:            - Normal esophagus.                        - Normal stomach. Biopsied.                        - Normal examined duodenum. Recommendation:        - Discharge  patient to home.                        - Resume previous diet.                        - Resume Eliquis (apixaban) at prior dose tomorrow.                        - Await pathology results.                        - Return to referring physician PRN. Procedure Code(s):     --- Professional ---                        [phone], Esophagogastroduodenoscopy, flexible,                         transoral; with biopsy, single or multiple Diagnosis Code(s):     --- Professional ---                        Z87.11, Personal history of peptic ulcer disease CPT copyright 2019 [employer]. All rights reserved. The codes documented in this report are preliminary and upon coder review may  be revised to meet current compliance requirements. Andrey Farmer MD, MD 06/15/2020 8:48:21 AM Number of Addenda: 0 Note Initiated On: 06/15/2020 8:16 AM Estimated Blood Loss:  Estimated blood loss was minimal.      [facility]

## 2020-06-15 NOTE — Transfer of Care (Signed)
Immediate Anesthesia Transfer of Care Note  Patient: Brenda Santana  Procedure(s) Performed: ESOPHAGOGASTRODUODENOSCOPY (EGD) WITH PROPOFOL (N/A )  Patient Location: PACU  Anesthesia Type:General  Level of Consciousness: sedated  Airway & Oxygen Therapy: Patient Spontanous Breathing and Patient connected to nasal cannula oxygen  Post-op Assessment: Report given to RN and Post -op Vital signs reviewed and stable  Post vital signs: Reviewed and stable  Last Vitals:  Vitals Value Taken Time  BP    Temp    Pulse    Resp    SpO2      Last Pain:  Vitals:   06/15/20 0822  TempSrc: Temporal  PainSc: 8          Complications: No complications documented.

## 2020-06-15 NOTE — Anesthesia Postprocedure Evaluation (Signed)
Anesthesia Post Note  Patient: TAI SYFERT  Procedure(s) Performed: ESOPHAGOGASTRODUODENOSCOPY (EGD) WITH PROPOFOL (N/A )  Patient location during evaluation: Endoscopy Anesthesia Type: General Level of consciousness: awake and alert Pain management: pain level controlled Vital Signs Assessment: post-procedure vital signs reviewed and stable Respiratory status: spontaneous breathing, nonlabored ventilation, respiratory function stable and patient connected to nasal cannula oxygen Cardiovascular status: blood pressure returned to baseline and stable Postop Assessment: no apparent nausea or vomiting Anesthetic complications: no   No complications documented.   Last Vitals:  Vitals:   06/15/20 0908 06/15/20 0918  BP:    Pulse:    Resp:    Temp:    SpO2: 93% 92%    Last Pain:  Vitals:   06/15/20 0918  TempSrc:   PainSc: 8                  Martha Clan

## 2020-06-15 NOTE — Interval H&P Note (Signed)
History and Physical Interval Note:  06/15/2020 8:22 AM  Brenda Santana  has presented today for surgery, with the diagnosis of peptic ulcer disease.  The various methods of treatment have been discussed with the patient and family. After consideration of risks, benefits and other options for treatment, the patient has consented to  Procedure(s): ESOPHAGOGASTRODUODENOSCOPY (EGD) WITH PROPOFOL (N/A) as a surgical intervention.  The patient's history has been reviewed, patient examined, no change in status, stable for surgery.  I have reviewed the patient's chart and labs.  Questions were answered to the patient's satisfaction.     Lesly Rubenstein  Ok to proceed with EGD

## 2020-06-17 ENCOUNTER — Other Ambulatory Visit: Payer: Self-pay

## 2020-06-17 DIAGNOSIS — B009 Herpesviral infection, unspecified: Secondary | ICD-10-CM

## 2020-06-17 MED ORDER — ACYCLOVIR 400 MG PO TABS: 400.0000 mg | ORAL_TABLET | Freq: Two times a day (BID) | ORAL | 1 refills | Status: DC

## 2020-06-18 LAB — SURGICAL PATHOLOGY

## 2020-06-22 ENCOUNTER — Other Ambulatory Visit: Payer: Self-pay

## 2020-06-22 MED ORDER — ALLOPURINOL 300 MG PO TABS: 300.0000 mg | ORAL_TABLET | Freq: Every day | ORAL | 0 refills | Status: DC

## 2020-06-23 ENCOUNTER — Other Ambulatory Visit: Payer: Self-pay | Admitting: Internal Medicine

## 2020-06-23 DIAGNOSIS — M5440 Lumbago with sciatica, unspecified side: Secondary | ICD-10-CM

## 2020-06-24 ENCOUNTER — Ambulatory Visit (INDEPENDENT_AMBULATORY_CARE_PROVIDER_SITE_OTHER): Payer: Medicaid Other | Admitting: Internal Medicine

## 2020-06-24 DIAGNOSIS — R0602 Shortness of breath: Secondary | ICD-10-CM

## 2020-06-24 DIAGNOSIS — J449 Chronic obstructive pulmonary disease, unspecified: Secondary | ICD-10-CM

## 2020-06-24 LAB — PULMONARY FUNCTION TEST

## 2020-06-26 ENCOUNTER — Ambulatory Visit (INDEPENDENT_AMBULATORY_CARE_PROVIDER_SITE_OTHER): Payer: Medicaid Other | Admitting: Hospice and Palliative Medicine

## 2020-06-26 ENCOUNTER — Other Ambulatory Visit: Payer: Self-pay

## 2020-06-26 ENCOUNTER — Encounter: Payer: Self-pay | Admitting: Hospice and Palliative Medicine

## 2020-06-26 VITALS — BP 136/88 | HR 60 | Temp 97.6°F | Resp 16 | Ht 66.0 in | Wt 271.4 lb

## 2020-06-26 DIAGNOSIS — E1165 Type 2 diabetes mellitus with hyperglycemia: Secondary | ICD-10-CM | POA: Diagnosis not present

## 2020-06-26 DIAGNOSIS — M5441 Lumbago with sciatica, right side: Secondary | ICD-10-CM

## 2020-06-26 DIAGNOSIS — Z1231 Encounter for screening mammogram for malignant neoplasm of breast: Secondary | ICD-10-CM

## 2020-06-26 DIAGNOSIS — M5442 Lumbago with sciatica, left side: Secondary | ICD-10-CM

## 2020-06-26 DIAGNOSIS — G8929 Other chronic pain: Secondary | ICD-10-CM

## 2020-06-26 DIAGNOSIS — Z6841 Body Mass Index (BMI) 40.0 and over, adult: Secondary | ICD-10-CM

## 2020-06-26 LAB — POCT GLYCOSYLATED HEMOGLOBIN (HGB A1C): Hemoglobin A1C: 8.9 % — AB (ref 4.0–5.6)

## 2020-06-26 MED ORDER — GLIMEPIRIDE 1 MG PO TABS: 1.0000 mg | ORAL_TABLET | Freq: Every day | ORAL | 0 refills | Status: DC

## 2020-06-26 MED ORDER — TOPIRAMATE 25 MG PO TABS: 25.0000 mg | ORAL_TABLET | Freq: Every day | ORAL | 0 refills | Status: DC

## 2020-06-26 MED ORDER — ACETAMINOPHEN-CODEINE 300-30 MG PO TABS: ORAL_TABLET | ORAL | 0 refills | Status: DC
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
## 2020-06-28 ENCOUNTER — Encounter: Payer: Self-pay | Admitting: Hospice and Palliative Medicine

## 2020-06-29 ENCOUNTER — Other Ambulatory Visit: Payer: Self-pay | Admitting: Internal Medicine

## 2020-06-29 DIAGNOSIS — K279 Peptic ulcer, site unspecified, unspecified as acute or chronic, without hemorrhage or perforation: Secondary | ICD-10-CM

## 2020-06-30 ENCOUNTER — Other Ambulatory Visit: Payer: Self-pay

## 2020-06-30 ENCOUNTER — Ambulatory Visit
Admission: RE | Admit: 2020-06-30 | Discharge: 2020-06-30 | Disposition: A | Discharge status: Home / Self Care | Payer: 59 | Source: Ambulatory Visit | Attending: Hospice and Palliative Medicine | Admitting: Hospice and Palliative Medicine

## 2020-06-30 DIAGNOSIS — Z1231 Encounter for screening mammogram for malignant neoplasm of breast: Secondary | ICD-10-CM | POA: Insufficient documentation

## 2020-07-02 ENCOUNTER — Other Ambulatory Visit: Payer: Self-pay | Admitting: Hospice and Palliative Medicine

## 2020-07-02 DIAGNOSIS — R928 Other abnormal and inconclusive findings on diagnostic imaging of breast: Secondary | ICD-10-CM

## 2020-07-02 DIAGNOSIS — N6489 Other specified disorders of breast: Secondary | ICD-10-CM

## 2020-07-06 ENCOUNTER — Other Ambulatory Visit: Payer: Self-pay | Admitting: Hospice and Palliative Medicine

## 2020-07-06 ENCOUNTER — Other Ambulatory Visit: Payer: Self-pay | Admitting: Internal Medicine

## 2020-07-06 DIAGNOSIS — R600 Localized edema: Secondary | ICD-10-CM

## 2020-07-06 DIAGNOSIS — M0579 Rheumatoid arthritis with rheumatoid factor of multiple sites without organ or systems involvement: Secondary | ICD-10-CM

## 2020-07-06 NOTE — Telephone Encounter (Signed)
LAST 06/26/20 AND NEXT 5/22

## 2020-07-08 ENCOUNTER — Ambulatory Visit: Payer: Medicaid Other | Admitting: Hospice and Palliative Medicine

## 2020-07-08 ENCOUNTER — Telehealth: Payer: Self-pay

## 2020-07-08 ENCOUNTER — Telehealth: Payer: Self-pay | Admitting: *Deleted

## 2020-07-08 NOTE — Telephone Encounter (Signed)
VM TO PT TO [facility] ADD'L IMAGING / MAMMO ON HOME # - NO ANSWER NO VM ON MOBILE #

## 2020-07-08 NOTE — Telephone Encounter (Signed)
Faxed [facility]arn[facility] to [facility], and scan JS

## 2020-07-09 ENCOUNTER — Encounter: Payer: Self-pay | Admitting: Internal Medicine

## 2020-07-09 ENCOUNTER — Ambulatory Visit (INDEPENDENT_AMBULATORY_CARE_PROVIDER_SITE_OTHER): Payer: Medicaid Other | Admitting: Internal Medicine

## 2020-07-09 VITALS — BP 130/72 | HR 62 | Temp 97.8°F | Resp 16 | Ht 66.0 in | Wt 270.0 lb

## 2020-07-09 DIAGNOSIS — G4733 Obstructive sleep apnea (adult) (pediatric): Secondary | ICD-10-CM

## 2020-07-09 DIAGNOSIS — K219 Gastro-esophageal reflux disease without esophagitis: Secondary | ICD-10-CM

## 2020-07-09 DIAGNOSIS — J449 Chronic obstructive pulmonary disease, unspecified: Secondary | ICD-10-CM | POA: Diagnosis not present

## 2020-07-09 DIAGNOSIS — Z6841 Body Mass Index (BMI) 40.0 and over, adult: Secondary | ICD-10-CM

## 2020-07-09 NOTE — Patient Instructions (Signed)
Chronic Obstructive Pulmonary Disease  Chronic obstructive pulmonary disease (COPD) is a long-term (chronic) lung problem. When you have COPD, it is hard for air to get in and out of your lungs. Usually the condition gets worse over time, and your lungs will never return to normal. There are things you can do to keep yourself as healthy as possible. What are the causes?  Smoking. This is the most common cause.  Certain genes passed from parent to child (inherited). What increases the risk?  Being exposed to secondhand smoke from cigarettes, pipes, or cigars.  Being exposed to chemicals and other irritants, such as fumes and dust in the work environment.  Having chronic lung conditions or infections. What are the signs or symptoms?  Shortness of breath, especially during physical activity.  A long-term cough with a large amount of thick mucus. Sometimes, the cough may not have any mucus (dry cough).  Wheezing.  Breathing quickly.  Skin that looks gray or blue, especially in the fingers, toes, or lips.  Feeling tired (fatigue).  Weight loss.  Chest tightness.  Having infections often.  Episodes when breathing symptoms become much worse (exacerbations). At the later stages of this disease, you may have swelling in the ankles, feet, or legs. How is this treated?  Taking medicines.  Quitting smoking, if you smoke.  Rehabilitation. This includes steps to make your body work better. It may involve a team of specialists.  Doing exercises.  Making changes to your diet.  Using oxygen.  Lung surgery.  Lung transplant.  Comfort measures (palliative care). Follow these instructions at home: Medicines  Take over-the-counter and prescription medicines only as told by your doctor.  Talk to your doctor before taking any cough or allergy medicines. You may need to avoid medicines that cause your lungs to be dry. Lifestyle  If you smoke, stop smoking. Smoking makes the  problem worse.  Do not smoke or use any products that contain nicotine or tobacco. If you need help quitting, ask your doctor.  Avoid being around things that make your breathing worse. This may include smoke, chemicals, and fumes.  Stay active, but remember to rest as well.  Learn and use tips on how to manage stress and control your breathing.  Make sure you get enough sleep. Most adults need at least 7 hours of sleep every night.  Eat healthy foods. Eat smaller meals more often. Rest before meals. Controlled breathing Learn and use tips on how to control your breathing as told by your doctor. Try:  Breathing in (inhaling) through your nose for 1 second. Then, pucker your lips and breath out (exhale) through your lips for 2 seconds.  Putting one hand on your belly (abdomen). Breathe in slowly through your nose for 1 second. Your hand on your belly should move out. Pucker your lips and breathe out slowly through your lips. Your hand on your belly should move in as you breathe out.   Controlled coughing Learn and use controlled coughing to clear mucus from your lungs. Follow these steps: 1. Lean your head a little forward. 2. Breathe in deeply. 3. Try to hold your breath for 3 seconds. 4. Keep your mouth slightly open while coughing 2 times. 5. Spit any mucus out into a tissue. 6. Rest and do the steps again 1 or 2 times as needed. General instructions  Make sure you get all the shots (vaccines) that your doctor recommends. Ask your doctor about a flu shot and a pneumonia shot.    Use oxygen therapy and pulmonary rehabilitation if told by your doctor. If you need home oxygen therapy, ask your doctor if you should buy a tool to measure your oxygen level (oximeter).  Make a COPD action plan with your doctor. This helps you to know what to do if you feel worse than usual.  Manage any other conditions you have as told by your doctor.  Avoid going outside when it is very hot, cold, or  humid.  Avoid people who have a sickness you can catch (contagious).  Keep all follow-up visits. Contact a doctor if:  You cough up more mucus than usual.  There is a change in the color or thickness of the mucus.  It is harder to breathe than usual.  Your breathing is faster than usual.  You have trouble sleeping.  You need to use your medicines more often than usual.  You have trouble doing your normal activities such as getting dressed or walking around the house. Get help right away if:  You have shortness of breath while resting.  You have shortness of breath that stops you from: ? Being able to talk. ? Doing normal activities.  Your chest hurts for longer than 5 minutes.  Your skin color is more blue than usual.  Your pulse oximeter shows that you have low oxygen for longer than 5 minutes.  You have a fever.  You feel too tired to breathe normally. These symptoms may represent a serious problem that is an emergency. Do not wait to see if the symptoms will go away. Get medical help right away. Call your local emergency services (911 in the [place] Do not drive yourself to the hospital. Summary  Chronic obstructive pulmonary disease (COPD) is a long-term lung problem.  The way your lungs work will never return to normal. Usually the condition gets worse over time. There are things you can do to keep yourself as healthy as possible.  Take over-the-counter and prescription medicines only as told by your doctor.  If you smoke, stop. Smoking makes the problem worse. This information is not intended to replace advice given to you by your health care provider. Make sure you discuss any questions you have with your health care provider. Document Revised: 02/25/2020 Document Reviewed: 02/25/2020 Elsevier Patient Education  2021 [employer]   
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
## 2020-07-12 NOTE — Procedures (Signed)
[place], [id number]  DATE OF SERVICE: June 24, 2020  Complete Pulmonary Function Testing Interpretation:  FINDINGS:  Forced vital capacity is moderately decreased.  The FEV1 is 1.35 L which is 57% of predicted and is moderately decreased.  Postbronchodilator there does not appear to be any improvement in the FEV1.  FEV1 FVC ratio is normal.  Total lung capacity is mildly decreased.  Residual volume is residual volume total lung capacity ratio is increased.  FRC is decreased.  DLCO was within normal limits.  IMPRESSION:  This pulmonary function study is suggestive of mild restrictive component as well as a possible moderate obstructive component clinical correlation is recommended.  Allyne Gee, MD [facility] Pulmonary Critical Care Medicine Sleep Medicine

## 2020-07-14 ENCOUNTER — Ambulatory Visit
Admission: RE | Admit: 2020-07-14 | Discharge: 2020-07-14 | Disposition: A | Discharge status: Home / Self Care | Payer: 59 | Source: Ambulatory Visit | Attending: Hospice and Palliative Medicine | Admitting: Hospice and Palliative Medicine

## 2020-07-14 ENCOUNTER — Other Ambulatory Visit: Payer: Self-pay

## 2020-07-14 DIAGNOSIS — R928 Other abnormal and inconclusive findings on diagnostic imaging of breast: Secondary | ICD-10-CM | POA: Insufficient documentation

## 2020-07-14 DIAGNOSIS — N6489 Other specified disorders of breast: Secondary | ICD-10-CM | POA: Diagnosis present

## 2020-07-15 ENCOUNTER — Telehealth: Payer: Self-pay

## 2020-07-15 NOTE — Telephone Encounter (Signed)
Anticoagulant management form signed by provider and faxed back to [facility] at [phone] and placed in scan. Loma Sousa

## 2020-07-21 ENCOUNTER — Ambulatory Visit: Payer: Medicare Other | Admitting: Hospice and Palliative Medicine

## 2020-07-24 ENCOUNTER — Other Ambulatory Visit: Payer: Self-pay | Admitting: Hospice and Palliative Medicine

## 2020-07-24 DIAGNOSIS — M5442 Lumbago with sciatica, left side: Secondary | ICD-10-CM

## 2020-07-24 DIAGNOSIS — G8929 Other chronic pain: Secondary | ICD-10-CM

## 2020-07-24 NOTE — Telephone Encounter (Signed)
Please review and send to pharmacy if appropriate. LOV: 06/26/20 NOV: 09/24/20

## 2020-07-27 ENCOUNTER — Other Ambulatory Visit: Payer: Self-pay | Admitting: Hospice and Palliative Medicine

## 2020-07-27 DIAGNOSIS — G8929 Other chronic pain: Secondary | ICD-10-CM

## 2020-07-27 DIAGNOSIS — M5442 Lumbago with sciatica, left side: Secondary | ICD-10-CM

## 2020-07-27 MED ORDER — ACETAMINOPHEN-CODEINE 300-30 MG PO TABS: ORAL_TABLET | ORAL | 0 refills | Status: DC

## 2020-07-28 ENCOUNTER — Telehealth: Payer: Self-pay

## 2020-07-28 NOTE — Telephone Encounter (Signed)
Completed medical records for [facility] Mailed to [facility]  [address]  [place] [address]  Faxed payment request to [phone] for $47.75

## 2020-07-31 ENCOUNTER — Other Ambulatory Visit: Payer: Self-pay | Admitting: Hospice and Palliative Medicine

## 2020-07-31 DIAGNOSIS — Z7901 Long term (current) use of anticoagulants: Secondary | ICD-10-CM

## 2020-08-19 ENCOUNTER — Other Ambulatory Visit: Payer: Self-pay | Admitting: Internal Medicine

## 2020-08-19 DIAGNOSIS — J449 Chronic obstructive pulmonary disease, unspecified: Secondary | ICD-10-CM

## 2020-08-23 ENCOUNTER — Other Ambulatory Visit: Payer: Self-pay | Admitting: Hospice and Palliative Medicine

## 2020-08-23 DIAGNOSIS — Z6841 Body Mass Index (BMI) 40.0 and over, adult: Secondary | ICD-10-CM

## 2020-08-24 ENCOUNTER — Other Ambulatory Visit: Payer: Self-pay | Admitting: Hospice and Palliative Medicine

## 2020-08-24 ENCOUNTER — Other Ambulatory Visit: Payer: Self-pay

## 2020-08-24 ENCOUNTER — Other Ambulatory Visit: Payer: Self-pay | Admitting: Adult Health

## 2020-08-24 ENCOUNTER — Other Ambulatory Visit: Payer: Self-pay | Admitting: Internal Medicine

## 2020-08-24 DIAGNOSIS — L732 Hidradenitis suppurativa: Secondary | ICD-10-CM

## 2020-08-24 DIAGNOSIS — E1165 Type 2 diabetes mellitus with hyperglycemia: Secondary | ICD-10-CM

## 2020-08-24 DIAGNOSIS — J449 Chronic obstructive pulmonary disease, unspecified: Secondary | ICD-10-CM

## 2020-08-24 DIAGNOSIS — M5440 Lumbago with sciatica, unspecified side: Secondary | ICD-10-CM

## 2020-08-24 DIAGNOSIS — E782 Mixed hyperlipidemia: Secondary | ICD-10-CM

## 2020-08-24 MED ORDER — METOPROLOL TARTRATE 50 MG PO TABS: 50.0000 mg | ORAL_TABLET | Freq: Two times a day (BID) | ORAL | 3 refills | Status: DC

## 2020-08-24 MED ORDER — ATORVASTATIN CALCIUM 10 MG PO TABS: 10.0000 mg | ORAL_TABLET | Freq: Every day | ORAL | 1 refills | Status: DC

## 2020-08-24 MED ORDER — ACYCLOVIR 5 % EX OINT: TOPICAL_OINTMENT | CUTANEOUS | 1 refills | Status: DC

## 2020-08-25 ENCOUNTER — Other Ambulatory Visit: Payer: Self-pay

## 2020-08-25 MED ORDER — METOPROLOL TARTRATE 50 MG PO TABS: 50.0000 mg | ORAL_TABLET | Freq: Two times a day (BID) | ORAL | 3 refills | Status: DC

## 2020-09-03 ENCOUNTER — Other Ambulatory Visit: Payer: Self-pay | Admitting: Internal Medicine

## 2020-09-03 DIAGNOSIS — M0579 Rheumatoid arthritis with rheumatoid factor of multiple sites without organ or systems involvement: Secondary | ICD-10-CM

## 2020-09-07 ENCOUNTER — Other Ambulatory Visit: Payer: Self-pay | Admitting: Hospice and Palliative Medicine

## 2020-09-07 DIAGNOSIS — M5440 Lumbago with sciatica, unspecified side: Secondary | ICD-10-CM

## 2020-09-22 ENCOUNTER — Other Ambulatory Visit: Payer: Self-pay

## 2020-09-22 DIAGNOSIS — Z7901 Long term (current) use of anticoagulants: Secondary | ICD-10-CM

## 2020-09-22 MED ORDER — ELIQUIS 5 MG PO TABS: 1.0000 | ORAL_TABLET | Freq: Two times a day (BID) | ORAL | 1 refills | Status: DC

## 2020-09-24 ENCOUNTER — Other Ambulatory Visit: Payer: Self-pay | Admitting: Adult Health

## 2020-09-24 ENCOUNTER — Other Ambulatory Visit: Payer: Self-pay | Admitting: Internal Medicine

## 2020-09-24 ENCOUNTER — Other Ambulatory Visit: Payer: Self-pay

## 2020-09-24 ENCOUNTER — Ambulatory Visit (INDEPENDENT_AMBULATORY_CARE_PROVIDER_SITE_OTHER): Payer: Medicaid Other | Admitting: Nurse Practitioner

## 2020-09-24 ENCOUNTER — Encounter: Payer: Self-pay | Admitting: Nurse Practitioner

## 2020-09-24 VITALS — BP 112/64 | HR 75 | Temp 97.3°F | Resp 16 | Ht 66.0 in | Wt 271.2 lb

## 2020-09-24 DIAGNOSIS — I10 Essential (primary) hypertension: Secondary | ICD-10-CM

## 2020-09-24 DIAGNOSIS — Z6841 Body Mass Index (BMI) 40.0 and over, adult: Secondary | ICD-10-CM

## 2020-09-24 DIAGNOSIS — J449 Chronic obstructive pulmonary disease, unspecified: Secondary | ICD-10-CM | POA: Diagnosis not present

## 2020-09-24 DIAGNOSIS — G8929 Other chronic pain: Secondary | ICD-10-CM

## 2020-09-24 DIAGNOSIS — L732 Hidradenitis suppurativa: Secondary | ICD-10-CM

## 2020-09-24 DIAGNOSIS — M5441 Lumbago with sciatica, right side: Secondary | ICD-10-CM

## 2020-09-24 DIAGNOSIS — E1165 Type 2 diabetes mellitus with hyperglycemia: Secondary | ICD-10-CM | POA: Diagnosis not present

## 2020-09-24 DIAGNOSIS — M5440 Lumbago with sciatica, unspecified side: Secondary | ICD-10-CM

## 2020-09-24 DIAGNOSIS — M5442 Lumbago with sciatica, left side: Secondary | ICD-10-CM

## 2020-09-24 DIAGNOSIS — E782 Mixed hyperlipidemia: Secondary | ICD-10-CM

## 2020-09-24 DIAGNOSIS — K219 Gastro-esophageal reflux disease without esophagitis: Secondary | ICD-10-CM

## 2020-09-24 LAB — POCT GLYCOSYLATED HEMOGLOBIN (HGB A1C): Hemoglobin A1C: 8.7 % — AB (ref 4.0–5.6)

## 2020-09-24 MED ORDER — DOXYCYCLINE HYCLATE 100 MG PO TABS: 100.0000 mg | ORAL_TABLET | Freq: Two times a day (BID) | ORAL | 2 refills | Status: DC

## 2020-09-24 MED ORDER — GLIMEPIRIDE 2 MG PO TABS: ORAL_TABLET | ORAL | 1 refills | Status: DC

## 2020-09-24 MED ORDER — ACETAMINOPHEN-CODEINE 300-30 MG PO TABS: ORAL_TABLET | ORAL | 0 refills | Status: DC

## 2020-09-24 MED ORDER — OZEMPIC (0.25 OR 0.5 MG/DOSE) 2 MG/1.5ML ~~LOC~~ SOPN: 0.2500 mg | PEN_INJECTOR | SUBCUTANEOUS | 2 refills | Status: DC

## 2020-09-24 MED ORDER — BUPROPION HCL ER (XL) 150 MG PO TB24: 150.0000 mg | ORAL_TABLET | Freq: Every day | ORAL | 0 refills | Status: DC
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
## 2020-09-25 MED ORDER — PREDNISONE 10 MG PO TABS: 20.0000 mg | ORAL_TABLET | Freq: Every day | ORAL | 0 refills | Status: DC

## 2020-09-27 DIAGNOSIS — L732 Hidradenitis suppurativa: Secondary | ICD-10-CM | POA: Insufficient documentation

## 2020-09-27 MED ORDER — TRELEGY ELLIPTA 100-62.5-25 MCG/INH IN AEPB: INHALATION_SPRAY | RESPIRATORY_TRACT | 6 refills | Status: DC

## 2020-10-02 ENCOUNTER — Other Ambulatory Visit: Payer: Self-pay | Admitting: Internal Medicine

## 2020-10-02 DIAGNOSIS — Z6841 Body Mass Index (BMI) 40.0 and over, adult: Secondary | ICD-10-CM

## 2020-10-05 ENCOUNTER — Other Ambulatory Visit: Payer: Self-pay

## 2020-10-05 DIAGNOSIS — J449 Chronic obstructive pulmonary disease, unspecified: Secondary | ICD-10-CM

## 2020-10-05 DIAGNOSIS — B009 Herpesviral infection, unspecified: Secondary | ICD-10-CM

## 2020-10-05 MED ORDER — ACYCLOVIR 400 MG PO TABS: 400.0000 mg | ORAL_TABLET | Freq: Two times a day (BID) | ORAL | 1 refills | Status: DC

## 2020-10-05 MED ORDER — ALBUTEROL SULFATE HFA 108 (90 BASE) MCG/ACT IN AERS: 2.0000 | INHALATION_SPRAY | RESPIRATORY_TRACT | 1 refills | Status: DC | PRN

## 2020-10-09 ENCOUNTER — Other Ambulatory Visit: Payer: Self-pay | Admitting: Nurse Practitioner

## 2020-10-09 DIAGNOSIS — G8929 Other chronic pain: Secondary | ICD-10-CM

## 2020-10-09 DIAGNOSIS — M0579 Rheumatoid arthritis with rheumatoid factor of multiple sites without organ or systems involvement: Secondary | ICD-10-CM

## 2020-10-09 MED ORDER — TRAMADOL HCL 50 MG PO TABS: ORAL_TABLET | ORAL | 0 refills | Status: DC

## 2020-10-21 ENCOUNTER — Ambulatory Visit (INDEPENDENT_AMBULATORY_CARE_PROVIDER_SITE_OTHER): Payer: Medicaid Other | Admitting: Nurse Practitioner

## 2020-10-21 ENCOUNTER — Encounter: Payer: Self-pay | Admitting: Nurse Practitioner

## 2020-10-21 ENCOUNTER — Other Ambulatory Visit: Payer: Self-pay

## 2020-10-21 VITALS — BP 150/72 | HR 70 | Temp 97.9°F | Resp 16 | Ht 66.0 in | Wt 270.0 lb

## 2020-10-21 DIAGNOSIS — I10 Essential (primary) hypertension: Secondary | ICD-10-CM | POA: Diagnosis not present

## 2020-10-21 DIAGNOSIS — M5442 Lumbago with sciatica, left side: Secondary | ICD-10-CM | POA: Diagnosis not present

## 2020-10-21 DIAGNOSIS — Z6841 Body Mass Index (BMI) 40.0 and over, adult: Secondary | ICD-10-CM

## 2020-10-21 DIAGNOSIS — E1165 Type 2 diabetes mellitus with hyperglycemia: Secondary | ICD-10-CM | POA: Diagnosis not present

## 2020-10-21 DIAGNOSIS — M5441 Lumbago with sciatica, right side: Secondary | ICD-10-CM

## 2020-10-21 DIAGNOSIS — G8929 Other chronic pain: Secondary | ICD-10-CM

## 2020-10-21 DIAGNOSIS — E782 Mixed hyperlipidemia: Secondary | ICD-10-CM

## 2020-10-21 LAB — POCT CBG (FASTING - GLUCOSE)-MANUAL ENTRY: Glucose Fasting, POC: 148 mg/dL — AB (ref 70–99)

## 2020-10-21 MED ORDER — PREVNAR 20 0.5 ML IM SUSY: 0.5000 mL | PREFILLED_SYRINGE | Freq: Once | INTRAMUSCULAR | 0 refills | Status: AC

## 2020-10-21 MED ORDER — OZEMPIC (0.25 OR 0.5 MG/DOSE) 2 MG/1.5ML ~~LOC~~ SOPN: 0.5000 mg | PEN_INJECTOR | SUBCUTANEOUS | 2 refills | Status: DC

## 2020-10-21 MED ORDER — BUPROPION HCL ER (XL) 300 MG PO TB24: 300.0000 mg | ORAL_TABLET | Freq: Every day | ORAL | 2 refills | Status: DC

## 2020-10-21 MED ORDER — ACETAMINOPHEN-CODEINE 300-30 MG PO TABS: ORAL_TABLET | ORAL | 0 refills | Status: DC
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
## 2020-10-27 ENCOUNTER — Other Ambulatory Visit: Payer: Self-pay

## 2020-10-27 ENCOUNTER — Telehealth: Payer: Self-pay

## 2020-10-27 DIAGNOSIS — J449 Chronic obstructive pulmonary disease, unspecified: Secondary | ICD-10-CM

## 2020-10-27 DIAGNOSIS — Z7901 Long term (current) use of anticoagulants: Secondary | ICD-10-CM

## 2020-10-27 DIAGNOSIS — L732 Hidradenitis suppurativa: Secondary | ICD-10-CM

## 2020-10-27 MED ORDER — TRELEGY ELLIPTA 100-62.5-25 MCG/INH IN AEPB
INHALATION_SPRAY | RESPIRATORY_TRACT | 6 refills | Status: DC
Start: 2020-10-27 — End: 2021-05-17

## 2020-10-27 MED ORDER — APIXABAN 5 MG PO TABS: 5.0000 mg | ORAL_TABLET | Freq: Two times a day (BID) | ORAL | 1 refills | Status: DC

## 2020-10-27 MED ORDER — LINACLOTIDE 145 MCG PO CAPS: ORAL_CAPSULE | ORAL | 1 refills | Status: DC

## 2020-10-27 MED ORDER — DOXYCYCLINE HYCLATE 100 MG PO TABS: 100.0000 mg | ORAL_TABLET | Freq: Two times a day (BID) | ORAL | 1 refills | Status: DC

## 2020-10-27 MED ORDER — CETIRIZINE HCL 10 MG PO TABS: 10.0000 mg | ORAL_TABLET | Freq: Every day | ORAL | 1 refills | Status: DC | PRN

## 2020-10-27 MED ORDER — GABAPENTIN 800 MG PO TABS: 800.0000 mg | ORAL_TABLET | Freq: Three times a day (TID) | ORAL | 1 refills | Status: DC

## 2020-10-27 NOTE — Telephone Encounter (Signed)
Pt called and informed me that someone was helping pt clean her house and accidentally throwed the bag away with her medications in it, pt is asking if we can send in the ones that was throwed away to her pharmacy.  Pt has also spoke to the insurance about the issue

## 2020-10-28 ENCOUNTER — Other Ambulatory Visit: Payer: Self-pay

## 2020-10-28 ENCOUNTER — Other Ambulatory Visit: Payer: Self-pay | Admitting: Adult Health

## 2020-10-28 ENCOUNTER — Other Ambulatory Visit: Payer: Self-pay | Admitting: Nurse Practitioner

## 2020-10-28 ENCOUNTER — Other Ambulatory Visit: Payer: Self-pay | Admitting: Internal Medicine

## 2020-10-28 DIAGNOSIS — Z6841 Body Mass Index (BMI) 40.0 and over, adult: Secondary | ICD-10-CM

## 2020-10-28 DIAGNOSIS — J449 Chronic obstructive pulmonary disease, unspecified: Secondary | ICD-10-CM

## 2020-10-28 DIAGNOSIS — R600 Localized edema: Secondary | ICD-10-CM

## 2020-10-28 DIAGNOSIS — M0579 Rheumatoid arthritis with rheumatoid factor of multiple sites without organ or systems involvement: Secondary | ICD-10-CM

## 2020-10-28 DIAGNOSIS — G8929 Other chronic pain: Secondary | ICD-10-CM

## 2020-10-28 MED ORDER — FUROSEMIDE 20 MG PO TABS: 20.0000 mg | ORAL_TABLET | Freq: Every day | ORAL | 1 refills | Status: DC

## 2020-11-04 ENCOUNTER — Other Ambulatory Visit: Payer: Self-pay | Admitting: Nurse Practitioner

## 2020-11-04 DIAGNOSIS — M0579 Rheumatoid arthritis with rheumatoid factor of multiple sites without organ or systems involvement: Secondary | ICD-10-CM

## 2020-11-04 DIAGNOSIS — M5442 Lumbago with sciatica, left side: Secondary | ICD-10-CM

## 2020-11-04 NOTE — Telephone Encounter (Signed)
done

## 2020-11-04 NOTE — Telephone Encounter (Signed)
Pt requested this due to having someone helping her clean her house and some of her medications were thrown in the trash by accident and needed refills

## 2020-11-05 NOTE — Telephone Encounter (Signed)
Prescription sent to pharmacy.

## 2020-11-14 ENCOUNTER — Other Ambulatory Visit: Payer: Self-pay | Admitting: Nurse Practitioner

## 2020-11-24 ENCOUNTER — Other Ambulatory Visit: Payer: Self-pay | Admitting: Nurse Practitioner

## 2020-11-24 DIAGNOSIS — G8929 Other chronic pain: Secondary | ICD-10-CM

## 2020-11-24 DIAGNOSIS — M5442 Lumbago with sciatica, left side: Secondary | ICD-10-CM

## 2020-11-25 NOTE — Telephone Encounter (Signed)
Medication sent to pharmacy  

## 2020-12-04 ENCOUNTER — Other Ambulatory Visit: Payer: Self-pay | Admitting: Nurse Practitioner

## 2020-12-04 DIAGNOSIS — M5442 Lumbago with sciatica, left side: Secondary | ICD-10-CM

## 2020-12-04 DIAGNOSIS — G8929 Other chronic pain: Secondary | ICD-10-CM

## 2020-12-18 ENCOUNTER — Telehealth: Payer: Self-pay

## 2020-12-18 NOTE — Telephone Encounter (Signed)
Left vm to confirm 12/21/20 appointment-[name] 

## 2020-12-21 ENCOUNTER — Telehealth: Payer: Self-pay

## 2020-12-21 ENCOUNTER — Encounter: Payer: Self-pay | Admitting: Nurse Practitioner

## 2020-12-21 ENCOUNTER — Ambulatory Visit (INDEPENDENT_AMBULATORY_CARE_PROVIDER_SITE_OTHER): Payer: Medicaid Other | Admitting: Nurse Practitioner

## 2020-12-21 ENCOUNTER — Other Ambulatory Visit: Payer: Self-pay

## 2020-12-21 VITALS — BP 140/82 | HR 64 | Temp 97.3°F | Resp 16 | Ht 66.0 in | Wt 267.0 lb

## 2020-12-21 DIAGNOSIS — E1165 Type 2 diabetes mellitus with hyperglycemia: Secondary | ICD-10-CM

## 2020-12-21 DIAGNOSIS — M0579 Rheumatoid arthritis with rheumatoid factor of multiple sites without organ or systems involvement: Secondary | ICD-10-CM | POA: Diagnosis not present

## 2020-12-21 DIAGNOSIS — M5442 Lumbago with sciatica, left side: Secondary | ICD-10-CM | POA: Diagnosis not present

## 2020-12-21 DIAGNOSIS — Z6841 Body Mass Index (BMI) 40.0 and over, adult: Secondary | ICD-10-CM

## 2020-12-21 DIAGNOSIS — I10 Essential (primary) hypertension: Secondary | ICD-10-CM

## 2020-12-21 DIAGNOSIS — M5441 Lumbago with sciatica, right side: Secondary | ICD-10-CM

## 2020-12-21 DIAGNOSIS — G8929 Other chronic pain: Secondary | ICD-10-CM

## 2020-12-21 DIAGNOSIS — F4321 Adjustment disorder with depressed mood: Secondary | ICD-10-CM

## 2020-12-21 LAB — POCT GLYCOSYLATED HEMOGLOBIN (HGB A1C): Hemoglobin A1C: 9 % — AB (ref 4.0–5.6)

## 2020-12-21 MED ORDER — LINACLOTIDE 145 MCG PO CAPS: ORAL_CAPSULE | ORAL | 1 refills | Status: DC

## 2020-12-21 MED ORDER — TRAMADOL HCL 50 MG PO TABS: 50.0000 mg | ORAL_TABLET | Freq: Three times a day (TID) | ORAL | 0 refills | Status: DC | PRN

## 2020-12-21 MED ORDER — SEMAGLUTIDE (1 MG/DOSE) 4 MG/3ML ~~LOC~~ SOPN: 1.0000 mg | PEN_INJECTOR | SUBCUTANEOUS | 3 refills | Status: DC

## 2020-12-21 MED ORDER — GLIMEPIRIDE 2 MG PO TABS
ORAL_TABLET | ORAL | 1 refills | Status: DC
Start: 2020-12-21 — End: 2021-10-25

## 2020-12-21 MED ORDER — DAPAGLIFLOZIN PROPANEDIOL 10 MG PO TABS: ORAL_TABLET | ORAL | 3 refills | Status: DC

## 2020-12-21 MED ORDER — GABAPENTIN 800 MG PO TABS: 800.0000 mg | ORAL_TABLET | Freq: Three times a day (TID) | ORAL | 1 refills | Status: DC

## 2020-12-21 MED ORDER — TETANUS-DIPHTH-ACELL PERTUSSIS 5-2.5-18.5 LF-MCG/0.5 IM SUSP: 0.5000 mL | Freq: Once | INTRAMUSCULAR | 0 refills | Status: AC
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
## 2020-12-21 NOTE — Telephone Encounter (Signed)
Patient called asking for a call back to let her know what to do with the injection she picked up from pharmacy. Sent message to Marinus Maw & Alex-[name]

## 2020-12-21 NOTE — Telephone Encounter (Signed)
Done  by Vivien Rota

## 2020-12-21 NOTE — Telephone Encounter (Signed)
Pt called, she needed clarification on the dosage of her Ozempic because the pharmacy gave her pens with different doses. Spoke to [employer], pt is to take 2 shots of the 0.'5mg'$  ('1mg'$  total) weekly until those pens run out, then '1mg'$  a week using the '4mg'$  pen which would be 1 shot a week. Spoke with pt again and made sure she had a full understanding that she is taking 1 mg a week and how to get that dose using both pens, she repeated instructions back to me. I also explained that if she had any confusion we could make her a nurse visit to explain in person what she needs to be taking and how.

## 2020-12-30 ENCOUNTER — Telehealth: Payer: Self-pay

## 2020-12-30 ENCOUNTER — Other Ambulatory Visit: Payer: Self-pay | Admitting: Nurse Practitioner

## 2020-12-30 DIAGNOSIS — M5442 Lumbago with sciatica, left side: Secondary | ICD-10-CM

## 2020-12-30 DIAGNOSIS — G8929 Other chronic pain: Secondary | ICD-10-CM

## 2020-12-30 MED ORDER — ACETAMINOPHEN-CODEINE 300-30 MG PO TABS: ORAL_TABLET | ORAL | 0 refills | Status: DC

## 2020-12-31 NOTE — Telephone Encounter (Signed)
Pt advised we send medicine

## 2021-01-04 ENCOUNTER — Other Ambulatory Visit: Payer: Self-pay | Admitting: Internal Medicine

## 2021-01-04 DIAGNOSIS — M5442 Lumbago with sciatica, left side: Secondary | ICD-10-CM

## 2021-01-04 DIAGNOSIS — G8929 Other chronic pain: Secondary | ICD-10-CM

## 2021-01-18 ENCOUNTER — Ambulatory Visit (INDEPENDENT_AMBULATORY_CARE_PROVIDER_SITE_OTHER): Payer: Medicaid Other | Admitting: Nurse Practitioner

## 2021-01-18 ENCOUNTER — Encounter: Payer: Self-pay | Admitting: Nurse Practitioner

## 2021-01-18 ENCOUNTER — Other Ambulatory Visit: Payer: Self-pay

## 2021-01-18 VITALS — BP 120/84 | HR 75 | Temp 98.2°F | Resp 16 | Ht 66.0 in | Wt 268.4 lb

## 2021-01-18 DIAGNOSIS — M5442 Lumbago with sciatica, left side: Secondary | ICD-10-CM

## 2021-01-18 DIAGNOSIS — M0579 Rheumatoid arthritis with rheumatoid factor of multiple sites without organ or systems involvement: Secondary | ICD-10-CM

## 2021-01-18 DIAGNOSIS — Z23 Encounter for immunization: Secondary | ICD-10-CM

## 2021-01-18 DIAGNOSIS — E1165 Type 2 diabetes mellitus with hyperglycemia: Secondary | ICD-10-CM

## 2021-01-18 DIAGNOSIS — M5441 Lumbago with sciatica, right side: Secondary | ICD-10-CM | POA: Diagnosis not present

## 2021-01-18 DIAGNOSIS — G8929 Other chronic pain: Secondary | ICD-10-CM

## 2021-01-18 LAB — GLUCOSE, POCT (MANUAL RESULT ENTRY): POC Glucose: 143 mg/dl — AB (ref 70–99)

## 2021-01-18 MED ORDER — TETANUS-DIPHTH-ACELL PERTUSSIS 5-2.5-18.5 LF-MCG/0.5 IM SUSP: 0.5000 mL | Freq: Once | INTRAMUSCULAR | 0 refills | Status: AC

## 2021-01-18 MED ORDER — TRAMADOL HCL 50 MG PO TABS: 50.0000 mg | ORAL_TABLET | Freq: Three times a day (TID) | ORAL | 0 refills | Status: DC | PRN
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
## 2021-01-21 ENCOUNTER — Other Ambulatory Visit: Payer: Self-pay

## 2021-01-21 ENCOUNTER — Ambulatory Visit (INDEPENDENT_AMBULATORY_CARE_PROVIDER_SITE_OTHER): Payer: Medicaid Other | Admitting: Internal Medicine

## 2021-01-21 ENCOUNTER — Encounter: Payer: Self-pay | Admitting: Internal Medicine

## 2021-01-21 VITALS — BP 120/78 | HR 77 | Temp 98.4°F | Resp 16 | Ht 66.0 in | Wt 268.4 lb

## 2021-01-21 DIAGNOSIS — J449 Chronic obstructive pulmonary disease, unspecified: Secondary | ICD-10-CM | POA: Diagnosis not present

## 2021-01-21 DIAGNOSIS — M0579 Rheumatoid arthritis with rheumatoid factor of multiple sites without organ or systems involvement: Secondary | ICD-10-CM | POA: Diagnosis not present

## 2021-01-21 DIAGNOSIS — R0602 Shortness of breath: Secondary | ICD-10-CM

## 2021-01-21 DIAGNOSIS — K219 Gastro-esophageal reflux disease without esophagitis: Secondary | ICD-10-CM
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
## 2021-01-21 NOTE — Patient Instructions (Signed)

## 2021-01-22 ENCOUNTER — Telehealth: Payer: Self-pay

## 2021-01-22 NOTE — Telephone Encounter (Signed)
Son to have patient return my call to move 07/15/21 appointment-[name]

## 2021-01-29 ENCOUNTER — Other Ambulatory Visit: Payer: Self-pay | Admitting: Nurse Practitioner

## 2021-01-29 DIAGNOSIS — G8929 Other chronic pain: Secondary | ICD-10-CM

## 2021-01-29 DIAGNOSIS — M5442 Lumbago with sciatica, left side: Secondary | ICD-10-CM

## 2021-01-29 NOTE — Telephone Encounter (Signed)
Med sent.

## 2021-01-29 NOTE — Telephone Encounter (Signed)
Last seen in 01/18/21 and next 11//21/2022

## 2021-02-07 ENCOUNTER — Other Ambulatory Visit: Payer: Self-pay | Admitting: Internal Medicine

## 2021-02-07 DIAGNOSIS — K279 Peptic ulcer, site unspecified, unspecified as acute or chronic, without hemorrhage or perforation: Secondary | ICD-10-CM

## 2021-02-16 ENCOUNTER — Telehealth: Payer: Self-pay

## 2021-02-16 ENCOUNTER — Other Ambulatory Visit: Payer: Self-pay | Admitting: Internal Medicine

## 2021-02-16 NOTE — Telephone Encounter (Signed)
Completed medical records for [facility] Mailed to [place] [address] Payment request of $73.25 faxed to [phone], phone number [phone]

## 2021-02-27 ENCOUNTER — Other Ambulatory Visit: Payer: Self-pay | Admitting: Nurse Practitioner

## 2021-03-01 ENCOUNTER — Other Ambulatory Visit: Payer: Self-pay

## 2021-03-01 MED ORDER — ALLOPURINOL 300 MG PO TABS: 300.0000 mg | ORAL_TABLET | Freq: Every day | ORAL | 1 refills | Status: DC

## 2021-03-01 NOTE — Telephone Encounter (Signed)
Med sent to pharmacy.

## 2021-03-02 ENCOUNTER — Other Ambulatory Visit: Payer: Self-pay | Admitting: Nurse Practitioner

## 2021-03-02 DIAGNOSIS — M5442 Lumbago with sciatica, left side: Secondary | ICD-10-CM

## 2021-03-02 DIAGNOSIS — G8929 Other chronic pain: Secondary | ICD-10-CM

## 2021-03-04 ENCOUNTER — Other Ambulatory Visit: Payer: Self-pay | Admitting: Internal Medicine

## 2021-03-04 DIAGNOSIS — E782 Mixed hyperlipidemia: Secondary | ICD-10-CM

## 2021-03-18 ENCOUNTER — Other Ambulatory Visit: Payer: Self-pay | Admitting: Nurse Practitioner

## 2021-03-18 DIAGNOSIS — M0579 Rheumatoid arthritis with rheumatoid factor of multiple sites without organ or systems involvement: Secondary | ICD-10-CM

## 2021-03-18 DIAGNOSIS — G8929 Other chronic pain: Secondary | ICD-10-CM

## 2021-03-18 NOTE — Telephone Encounter (Signed)
Last 01/18/2021 and next 03/22/2021

## 2021-03-19 NOTE — Telephone Encounter (Signed)
Med sent to pharmacy.

## 2021-03-22 ENCOUNTER — Ambulatory Visit (INDEPENDENT_AMBULATORY_CARE_PROVIDER_SITE_OTHER): Payer: Medicaid Other | Admitting: Nurse Practitioner

## 2021-03-22 ENCOUNTER — Encounter: Payer: Self-pay | Admitting: Nurse Practitioner

## 2021-03-22 ENCOUNTER — Other Ambulatory Visit: Payer: Self-pay

## 2021-03-22 VITALS — BP 120/72 | HR 75 | Temp 98.4°F | Resp 16 | Ht 66.0 in | Wt 274.0 lb

## 2021-03-22 DIAGNOSIS — Z23 Encounter for immunization: Secondary | ICD-10-CM

## 2021-03-22 DIAGNOSIS — I10 Essential (primary) hypertension: Secondary | ICD-10-CM

## 2021-03-22 DIAGNOSIS — Z6841 Body Mass Index (BMI) 40.0 and over, adult: Secondary | ICD-10-CM | POA: Diagnosis not present

## 2021-03-22 DIAGNOSIS — E1165 Type 2 diabetes mellitus with hyperglycemia: Secondary | ICD-10-CM

## 2021-03-22 LAB — POCT GLYCOSYLATED HEMOGLOBIN (HGB A1C): Hemoglobin A1C: 9.1 % — AB (ref 4.0–5.6)

## 2021-03-22 MED ORDER — SEMAGLUTIDE (1 MG/DOSE) 4 MG/3ML ~~LOC~~ SOPN: 1.0000 mg | PEN_INJECTOR | SUBCUTANEOUS | 3 refills | Status: DC

## 2021-03-22 MED ORDER — PNEUMOCOCCAL 20-VAL CONJ VACC 0.5 ML IM SUSY: 0.5000 mL | PREFILLED_SYRINGE | INTRAMUSCULAR | 0 refills | Status: AC

## 2021-03-22 MED ORDER — TOPIRAMATE 25 MG PO TABS: 25.0000 mg | ORAL_TABLET | Freq: Every day | ORAL | 0 refills | Status: DC

## 2021-03-22 MED ORDER — TETANUS-DIPHTH-ACELL PERTUSSIS 5-2.5-18.5 LF-MCG/0.5 IM SUSP: 0.5000 mL | Freq: Once | INTRAMUSCULAR | 0 refills | Status: AC
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
## 2021-04-14 ENCOUNTER — Ambulatory Visit: Payer: Medicaid Other | Admitting: Nurse Practitioner

## 2021-04-16 ENCOUNTER — Other Ambulatory Visit: Payer: Self-pay | Admitting: Nurse Practitioner

## 2021-04-16 ENCOUNTER — Telehealth: Payer: Self-pay

## 2021-04-16 ENCOUNTER — Other Ambulatory Visit: Payer: Self-pay | Admitting: Internal Medicine

## 2021-04-16 DIAGNOSIS — G8929 Other chronic pain: Secondary | ICD-10-CM

## 2021-04-16 DIAGNOSIS — M5441 Lumbago with sciatica, right side: Secondary | ICD-10-CM

## 2021-04-16 DIAGNOSIS — R600 Localized edema: Secondary | ICD-10-CM

## 2021-04-16 DIAGNOSIS — M0579 Rheumatoid arthritis with rheumatoid factor of multiple sites without organ or systems involvement: Secondary | ICD-10-CM

## 2021-04-16 DIAGNOSIS — L732 Hidradenitis suppurativa: Secondary | ICD-10-CM

## 2021-04-16 DIAGNOSIS — Z7901 Long term (current) use of anticoagulants: Secondary | ICD-10-CM

## 2021-04-16 MED ORDER — ACETAMINOPHEN-CODEINE 300-30 MG PO TABS: 2.0000 | ORAL_TABLET | Freq: Two times a day (BID) | ORAL | 0 refills | Status: DC | PRN

## 2021-04-16 NOTE — Telephone Encounter (Signed)
Can you please review and send  

## 2021-04-16 NOTE — Telephone Encounter (Signed)
Meds sent to pharmacy.

## 2021-04-16 NOTE — Telephone Encounter (Signed)
Pt notified that we send your meds

## 2021-04-16 NOTE — Telephone Encounter (Signed)
Med sent to pharmacy.

## 2021-04-22 ENCOUNTER — Ambulatory Visit: Payer: Medicaid Other | Admitting: Nurse Practitioner

## 2021-05-12 ENCOUNTER — Other Ambulatory Visit: Payer: Self-pay | Admitting: Nurse Practitioner

## 2021-05-12 DIAGNOSIS — M5442 Lumbago with sciatica, left side: Secondary | ICD-10-CM

## 2021-05-12 DIAGNOSIS — G8929 Other chronic pain: Secondary | ICD-10-CM

## 2021-05-14 ENCOUNTER — Other Ambulatory Visit: Payer: Self-pay | Admitting: Nurse Practitioner

## 2021-05-14 DIAGNOSIS — G8929 Other chronic pain: Secondary | ICD-10-CM

## 2021-05-14 DIAGNOSIS — M5442 Lumbago with sciatica, left side: Secondary | ICD-10-CM

## 2021-05-14 DIAGNOSIS — M0579 Rheumatoid arthritis with rheumatoid factor of multiple sites without organ or systems involvement: Secondary | ICD-10-CM

## 2021-05-14 NOTE — Telephone Encounter (Signed)
Med sent to pharmacy.

## 2021-05-17 ENCOUNTER — Ambulatory Visit (INDEPENDENT_AMBULATORY_CARE_PROVIDER_SITE_OTHER): Payer: Medicaid Other | Admitting: Nurse Practitioner

## 2021-05-17 ENCOUNTER — Other Ambulatory Visit: Payer: Self-pay

## 2021-05-17 ENCOUNTER — Encounter: Payer: Self-pay | Admitting: Nurse Practitioner

## 2021-05-17 ENCOUNTER — Other Ambulatory Visit: Payer: Self-pay | Admitting: Nurse Practitioner

## 2021-05-17 VITALS — BP 130/86 | HR 91 | Temp 98.2°F | Resp 16 | Ht 66.0 in | Wt 275.0 lb

## 2021-05-17 DIAGNOSIS — E1165 Type 2 diabetes mellitus with hyperglycemia: Secondary | ICD-10-CM

## 2021-05-17 DIAGNOSIS — L720 Epidermal cyst: Secondary | ICD-10-CM

## 2021-05-17 DIAGNOSIS — L732 Hidradenitis suppurativa: Secondary | ICD-10-CM

## 2021-05-17 DIAGNOSIS — G8929 Other chronic pain: Secondary | ICD-10-CM

## 2021-05-17 DIAGNOSIS — J449 Chronic obstructive pulmonary disease, unspecified: Secondary | ICD-10-CM | POA: Diagnosis not present

## 2021-05-17 DIAGNOSIS — Z6841 Body Mass Index (BMI) 40.0 and over, adult: Secondary | ICD-10-CM

## 2021-05-17 DIAGNOSIS — M5442 Lumbago with sciatica, left side: Secondary | ICD-10-CM

## 2021-05-17 DIAGNOSIS — M5441 Lumbago with sciatica, right side: Secondary | ICD-10-CM

## 2021-05-17 MED ORDER — LIDOCAINE 5 % EX OINT: TOPICAL_OINTMENT | Freq: Three times a day (TID) | CUTANEOUS | 1 refills | Status: DC | PRN

## 2021-05-17 MED ORDER — MONTELUKAST SODIUM 10 MG PO TABS: 10.0000 mg | ORAL_TABLET | Freq: Every day | ORAL | 1 refills | Status: DC

## 2021-05-17 MED ORDER — TRELEGY ELLIPTA 100-62.5-25 MCG/ACT IN AEPB: 1.0000 | INHALATION_SPRAY | Freq: Every day | RESPIRATORY_TRACT | 3 refills | Status: DC

## 2021-05-17 MED ORDER — SEMAGLUTIDE (1 MG/DOSE) 4 MG/3ML ~~LOC~~ SOPN: 1.0000 mg | PEN_INJECTOR | SUBCUTANEOUS | 3 refills | Status: DC

## 2021-05-17 MED ORDER — CLINDAMYCIN PHOSPHATE 1 % EX GEL: Freq: Two times a day (BID) | CUTANEOUS | 0 refills | Status: DC

## 2021-05-17 MED ORDER — GABAPENTIN 800 MG PO TABS: 800.0000 mg | ORAL_TABLET | Freq: Three times a day (TID) | ORAL | 1 refills | Status: DC

## 2021-05-17 MED ORDER — TOPIRAMATE 25 MG PO TABS: 25.0000 mg | ORAL_TABLET | Freq: Every day | ORAL | 1 refills | Status: DC

## 2021-05-17 MED ORDER — DAPAGLIFLOZIN PROPANEDIOL 10 MG PO TABS: ORAL_TABLET | ORAL | 3 refills | Status: DC
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
## 2021-05-18 LAB — COMPREHENSIVE METABOLIC PANEL
ALT: 29 IU/L (ref 0–32)
AST: 26 IU/L (ref 0–40)
Albumin/Globulin Ratio: 1.9 (ref 1.2–2.2)
Albumin: 4.2 g/dL (ref 3.8–4.9)
Alkaline Phosphatase: 124 IU/L — ABNORMAL HIGH (ref 44–121)
BUN/Creatinine Ratio: 15 (ref 12–28)
BUN: 15 mg/dL (ref 8–27)
Bilirubin Total: 0.3 mg/dL (ref 0.0–1.2)
CO2: 23 mmol/L (ref 20–29)
Calcium: 9.4 mg/dL (ref 8.7–10.3)
Chloride: 100 mmol/L (ref 96–106)
Creatinine, Ser: 1 mg/dL (ref 0.57–1.00)
Globulin, Total: 2.2 g/dL (ref 1.5–4.5)
Glucose: 169 mg/dL — ABNORMAL HIGH (ref 70–99)
Potassium: 4 mmol/L (ref 3.5–5.2)
Sodium: 139 mmol/L (ref 134–144)
Total Protein: 6.4 g/dL (ref 6.0–8.5)
eGFR: 64 mL/min/{1.73_m2} (ref >59)

## 2021-05-18 LAB — CBC WITH DIFFERENTIAL/PLATELET
Basophils Absolute: 0 10*3/uL (ref 0.0–0.2)
Basos: 0 %
EOS (ABSOLUTE): 0.1 10*3/uL (ref 0.0–0.4)
Eos: 1 %
Hematocrit: 42.9 % (ref 34.0–46.6)
Hemoglobin: 14.1 g/dL (ref 11.1–15.9)
Immature Grans (Abs): 0 10*3/uL (ref 0.0–0.1)
Immature Granulocytes: 0 %
Lymphocytes Absolute: 1.8 10*3/uL (ref 0.7–3.1)
Lymphs: 23 %
MCH: 32.3 pg (ref 26.6–33.0)
MCHC: 32.9 g/dL (ref 31.5–35.7)
MCV: 98 fL — ABNORMAL HIGH (ref 79–97)
Monocytes Absolute: 1 10*3/uL — ABNORMAL HIGH (ref 0.1–0.9)
Monocytes: 12 %
Neutrophils Absolute: 4.9 10*3/uL (ref 1.4–7.0)
Neutrophils: 64 %
Platelets: 250 10*3/uL (ref 150–450)
RBC: 4.36 x10E6/uL (ref 3.77–5.28)
RDW: 13.1 % (ref 11.7–15.4)
WBC: 7.9 10*3/uL (ref 3.4–10.8)

## 2021-05-18 LAB — VITAMIN D 25 HYDROXY (VIT D DEFICIENCY, FRACTURES): Vit D, 25-Hydroxy: 16.8 ng/mL — ABNORMAL LOW (ref 30.0–100.0)

## 2021-05-18 LAB — B12 AND FOLATE PANEL
Folate: 15.8 ng/mL (ref >3.0)
Vitamin B-12: 444 pg/mL (ref 232–1245)

## 2021-05-18 LAB — TSH: TSH: 1.83 u[IU]/mL (ref 0.450–4.500)

## 2021-05-18 LAB — T4, FREE: Free T4: 1.16 ng/dL (ref 0.82–1.77)

## 2021-05-19 ENCOUNTER — Telehealth: Payer: Self-pay

## 2021-05-19 NOTE — Telephone Encounter (Signed)
Left vm and sent mychart message to confirm 05/24/21 appointment-[name]

## 2021-05-24 ENCOUNTER — Telehealth: Payer: Self-pay

## 2021-05-24 ENCOUNTER — Ambulatory Visit (INDEPENDENT_AMBULATORY_CARE_PROVIDER_SITE_OTHER): Payer: Medicaid Other | Admitting: Nurse Practitioner

## 2021-05-24 ENCOUNTER — Other Ambulatory Visit: Payer: Self-pay

## 2021-05-24 ENCOUNTER — Encounter: Payer: Self-pay | Admitting: Nurse Practitioner

## 2021-05-24 VITALS — BP 124/90 | HR 73 | Temp 98.4°F | Resp 16 | Ht 66.0 in | Wt 275.0 lb

## 2021-05-24 DIAGNOSIS — M5442 Lumbago with sciatica, left side: Secondary | ICD-10-CM

## 2021-05-24 DIAGNOSIS — Z780 Asymptomatic menopausal state: Secondary | ICD-10-CM

## 2021-05-24 DIAGNOSIS — Z0001 Encounter for general adult medical examination with abnormal findings: Secondary | ICD-10-CM | POA: Diagnosis not present

## 2021-05-24 DIAGNOSIS — E1165 Type 2 diabetes mellitus with hyperglycemia: Secondary | ICD-10-CM

## 2021-05-24 DIAGNOSIS — Z6841 Body Mass Index (BMI) 40.0 and over, adult: Secondary | ICD-10-CM

## 2021-05-24 DIAGNOSIS — Z1231 Encounter for screening mammogram for malignant neoplasm of breast: Secondary | ICD-10-CM

## 2021-05-24 DIAGNOSIS — M5441 Lumbago with sciatica, right side: Secondary | ICD-10-CM

## 2021-05-24 DIAGNOSIS — I10 Essential (primary) hypertension: Secondary | ICD-10-CM

## 2021-05-24 DIAGNOSIS — G8929 Other chronic pain: Secondary | ICD-10-CM

## 2021-05-24 DIAGNOSIS — E559 Vitamin D deficiency, unspecified: Secondary | ICD-10-CM

## 2021-05-24 DIAGNOSIS — Z23 Encounter for immunization: Secondary | ICD-10-CM

## 2021-05-24 DIAGNOSIS — R300 Dysuria: Secondary | ICD-10-CM

## 2021-05-24 MED ORDER — TETANUS-DIPHTH-ACELL PERTUSSIS 5-2.5-18.5 LF-MCG/0.5 IM SUSP: 0.5000 mL | Freq: Once | INTRAMUSCULAR | 0 refills | Status: AC

## 2021-05-24 MED ORDER — VITAMIN D (ERGOCALCIFEROL) 1.25 MG (50000 UNIT) PO CAPS: 50000.0000 [IU] | ORAL_CAPSULE | ORAL | 3 refills | Status: DC

## 2021-05-24 MED ORDER — ACETAMINOPHEN-CODEINE 300-30 MG PO TABS: 1.0000 | ORAL_TABLET | Freq: Four times a day (QID) | ORAL | 0 refills | Status: DC | PRN

## 2021-05-24 MED ORDER — SEMAGLUTIDE (2 MG/DOSE) 8 MG/3ML ~~LOC~~ SOPN: 2.0000 mg | PEN_INJECTOR | SUBCUTANEOUS | 2 refills | Status: DC

## 2021-05-24 NOTE — Patient Instructions (Signed)
Please call [facility] to schedule mammogram and bone density scan.

## 2021-05-24 NOTE — Telephone Encounter (Signed)
Awaiting 05/24/21 office notes for podiatry referral-[name]
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
## 2021-05-25 NOTE — Telephone Encounter (Signed)
Podiatry referral sent via [facility] to [facility]

## 2021-05-27 LAB — MICROSCOPIC EXAMINATION
Casts: NONE SEEN /lpf
Epithelial Cells (non renal): >10 /hpf — AB (ref 0–10)
RBC, Urine: NONE SEEN /hpf (ref 0–2)
WBC, UA: >30 /hpf — AB (ref 0–5)

## 2021-05-27 LAB — UA/M W/RFLX CULTURE, ROUTINE
Bilirubin, UA: NEGATIVE
Ketones, UA: NEGATIVE
Nitrite, UA: NEGATIVE
Specific Gravity, UA: 1.024 (ref 1.005–1.030)
Urobilinogen, Ur: 0.2 mg/dL (ref 0.2–1.0)
pH, UA: 5 (ref 5.0–7.5)

## 2021-05-27 LAB — URINE CULTURE, REFLEX

## 2021-06-02 NOTE — Telephone Encounter (Signed)
Podiatry lvm 05/25/21. Patient has not returned their call to schedule. I lvm for patient to return my call-[name]

## 2021-06-04 ENCOUNTER — Telehealth: Payer: Self-pay

## 2021-06-04 ENCOUNTER — Other Ambulatory Visit: Payer: Self-pay | Admitting: Nurse Practitioner

## 2021-06-04 DIAGNOSIS — G8929 Other chronic pain: Secondary | ICD-10-CM

## 2021-06-04 MED ORDER — ACETAMINOPHEN-CODEINE 300-30 MG PO TABS: 1.0000 | ORAL_TABLET | Freq: Four times a day (QID) | ORAL | 0 refills | Status: DC | PRN

## 2021-06-04 NOTE — Telephone Encounter (Signed)
Patient called back. I gave her podiatry tele # to schedule appointment. She then asked me about her Tylenol 3. Gave call to [facility]

## 2021-06-04 NOTE — Telephone Encounter (Signed)
Pt notified that we send new pres

## 2021-06-10 DIAGNOSIS — E119 Type 2 diabetes mellitus without complications: Secondary | ICD-10-CM | POA: Diagnosis not present

## 2021-06-11 ENCOUNTER — Other Ambulatory Visit: Payer: Self-pay | Admitting: Nurse Practitioner

## 2021-06-11 DIAGNOSIS — M0579 Rheumatoid arthritis with rheumatoid factor of multiple sites without organ or systems involvement: Secondary | ICD-10-CM

## 2021-06-11 DIAGNOSIS — M5441 Lumbago with sciatica, right side: Secondary | ICD-10-CM

## 2021-06-11 DIAGNOSIS — G8929 Other chronic pain: Secondary | ICD-10-CM

## 2021-06-11 NOTE — Telephone Encounter (Signed)
Med sent to pharmacy.

## 2021-06-13 ENCOUNTER — Other Ambulatory Visit: Payer: Self-pay | Admitting: Nurse Practitioner

## 2021-06-13 DIAGNOSIS — L732 Hidradenitis suppurativa: Secondary | ICD-10-CM

## 2021-06-13 DIAGNOSIS — L720 Epidermal cyst: Secondary | ICD-10-CM

## 2021-06-16 DIAGNOSIS — H5213 Myopia, bilateral: Secondary | ICD-10-CM | POA: Diagnosis not present

## 2021-06-17 ENCOUNTER — Encounter: Payer: Self-pay | Admitting: Nurse Practitioner

## 2021-06-21 ENCOUNTER — Encounter: Payer: Self-pay | Admitting: Nurse Practitioner

## 2021-06-21 ENCOUNTER — Other Ambulatory Visit: Payer: Self-pay

## 2021-06-21 ENCOUNTER — Ambulatory Visit (INDEPENDENT_AMBULATORY_CARE_PROVIDER_SITE_OTHER): Payer: Medicare Other | Admitting: Nurse Practitioner

## 2021-06-21 VITALS — BP 120/70 | HR 74 | Temp 98.1°F | Resp 16 | Ht 66.0 in | Wt 271.2 lb

## 2021-06-21 DIAGNOSIS — E1165 Type 2 diabetes mellitus with hyperglycemia: Secondary | ICD-10-CM

## 2021-06-21 DIAGNOSIS — Z23 Encounter for immunization: Secondary | ICD-10-CM | POA: Diagnosis not present

## 2021-06-21 DIAGNOSIS — Z01411 Encounter for gynecological examination (general) (routine) with abnormal findings: Secondary | ICD-10-CM

## 2021-06-21 DIAGNOSIS — Z113 Encounter for screening for infections with a predominantly sexual mode of transmission: Secondary | ICD-10-CM

## 2021-06-21 DIAGNOSIS — Z124 Encounter for screening for malignant neoplasm of cervix: Secondary | ICD-10-CM

## 2021-06-21 DIAGNOSIS — J449 Chronic obstructive pulmonary disease, unspecified: Secondary | ICD-10-CM

## 2021-06-21 LAB — POCT GLYCOSYLATED HEMOGLOBIN (HGB A1C): Hemoglobin A1C: 9 % — AB (ref 4.0–5.6)

## 2021-06-21 MED ORDER — TETANUS-DIPHTH-ACELL PERTUSSIS 5-2.5-18.5 LF-MCG/0.5 IM SUSP: 0.5000 mL | Freq: Once | INTRAMUSCULAR | 0 refills | Status: AC
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
## 2021-06-23 LAB — NUSWAB VAGINITIS PLUS (VG+)
Atopobium vaginae: HIGH Score — AB
Candida albicans, NAA: POSITIVE — AB
Candida glabrata, NAA: POSITIVE — AB
Chlamydia trachomatis, NAA: NEGATIVE
Neisseria gonorrhoeae, NAA: NEGATIVE
Trich vag by NAA: NEGATIVE

## 2021-06-25 ENCOUNTER — Other Ambulatory Visit: Payer: Self-pay | Admitting: Nurse Practitioner

## 2021-06-25 ENCOUNTER — Telehealth: Payer: Self-pay

## 2021-06-25 DIAGNOSIS — B9689 Other specified bacterial agents as the cause of diseases classified elsewhere: Secondary | ICD-10-CM

## 2021-06-25 DIAGNOSIS — B3731 Acute candidiasis of vulva and vagina: Secondary | ICD-10-CM

## 2021-06-25 LAB — IGP, APTIMA HPV: HPV Aptima: NEGATIVE

## 2021-06-25 MED ORDER — FLUCONAZOLE 150 MG PO TABS: 150.0000 mg | ORAL_TABLET | Freq: Once | ORAL | 0 refills | Status: AC

## 2021-06-25 MED ORDER — METRONIDAZOLE 500 MG PO TABS: 500.0000 mg | ORAL_TABLET | Freq: Two times a day (BID) | ORAL | 0 refills | Status: DC

## 2021-06-25 NOTE — Telephone Encounter (Signed)
Pt.notified

## 2021-06-25 NOTE — Progress Notes (Signed)
Please call and let patient know that her Pap came back negative for intraepithelial lesion or malignancy and negative for HPV.  Also, her new swab cervical specimen came back positive for possible bacterial vaginosis and a yeast infection.  I have sent a prescription for metronidazole and fluconazole to her pharmacy.  Please let me know if she has any further questions

## 2021-06-25 NOTE — Telephone Encounter (Signed)
-----   Message from Jonetta Osgood, NP sent at 06/25/2021  2:03 PM EST ----- Please call and let patient know that her Pap came back negative for intraepithelial lesion or malignancy and negative for HPV.  Also, her new swab cervical specimen came back positive for possible bacterial vaginosis and a yeast infection.  I have sent a prescription for metronidazole and fluconazole to her pharmacy.  Please let me know if she has any further questions

## 2021-06-28 ENCOUNTER — Ambulatory Visit: Payer: Medicare Other | Admitting: Nurse Practitioner

## 2021-07-02 IMAGING — CT CT CHEST W/ CM
2 of 4 series · 15 of 36 positions shown, 18 images · IV contrast (omnipaque)
Comparison: CT chest 10/15/2015

CLINICAL DATA: Chronic cough, history of COPD, OWSQW-1G positive
04/28/2019, history of asthma with inhaler usage, former smoker.

EXAM:
CT CHEST WITH CONTRAST
TECHNIQUE: Multidetector CT imaging of the chest was performed during
intravenous contrast administration.
CONTRAST:  60mL OMNIPAQUE IOHEXOL 300 MG/ML  SOLN

[Series 2: axial chest 2.00 · axial · 0.63mm/px · z∈[-1137,-881]mm · 12 of 152 slices shown, 15 images]
[im 12/152  mediastinal]
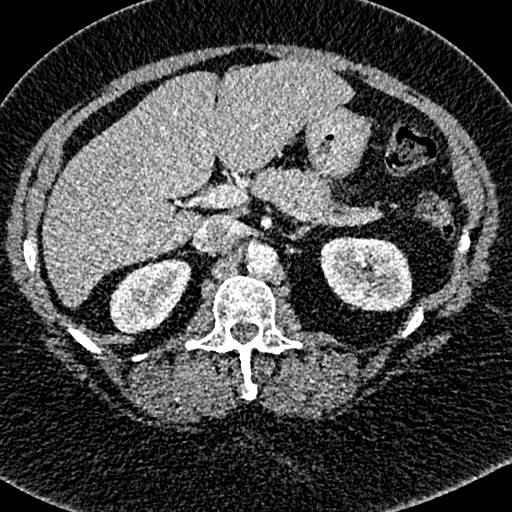
[im 12/152  lung]
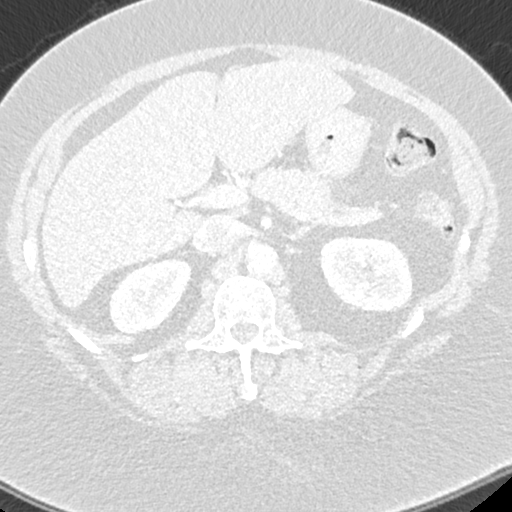
[im 24/152  lung]
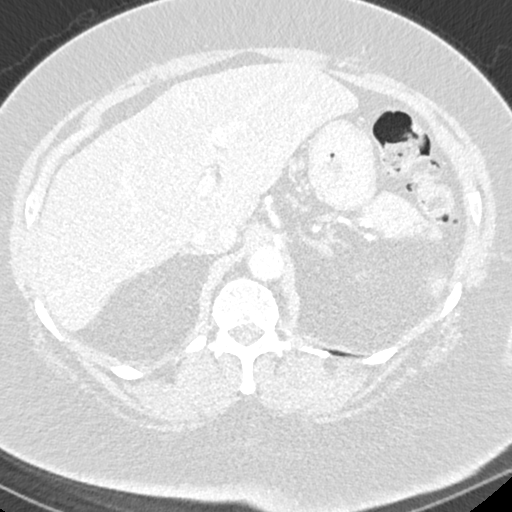
[im 35/152  lung]
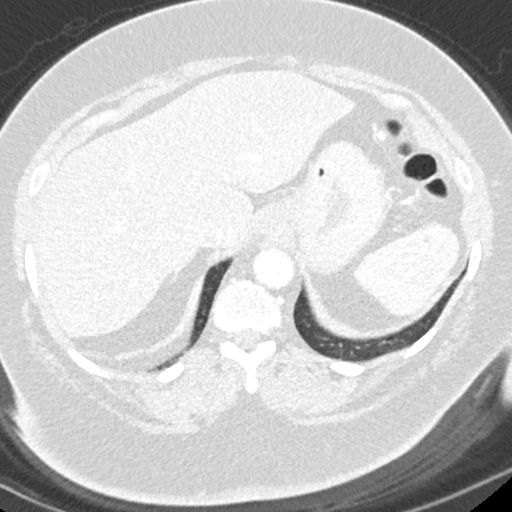
[im 47/152  lung]
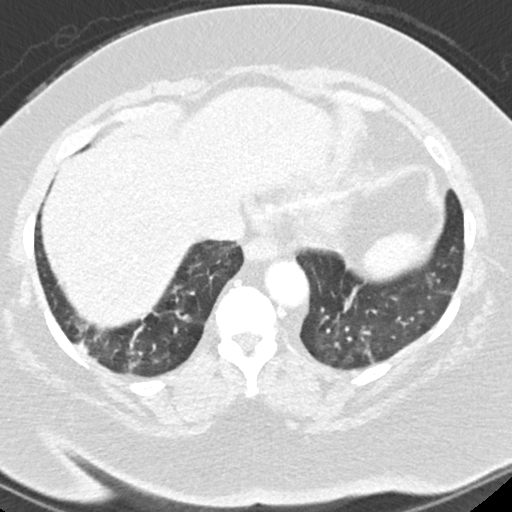
[im 59/152  mediastinal]
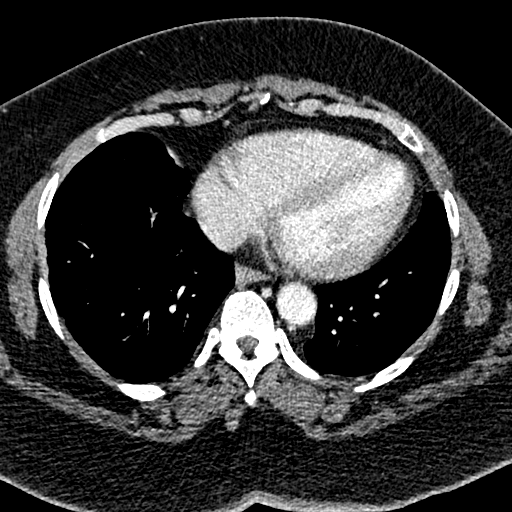
[im 59/152  lung]
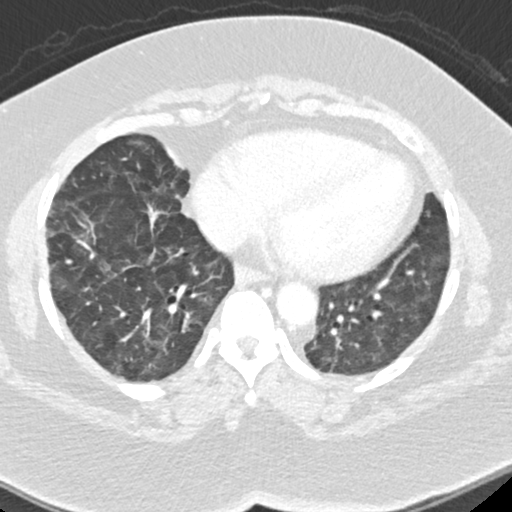
[im 70/152  lung]
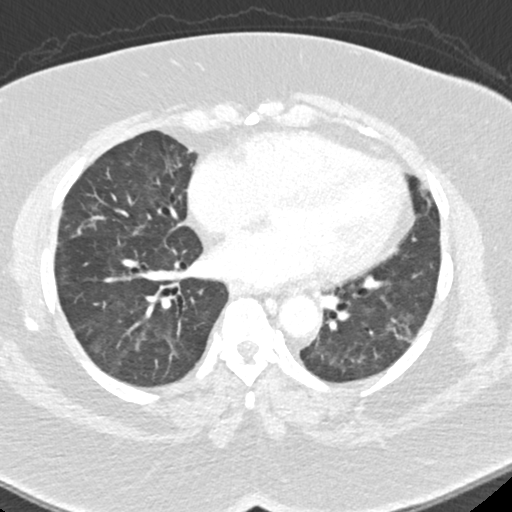
[im 82/152  lung]
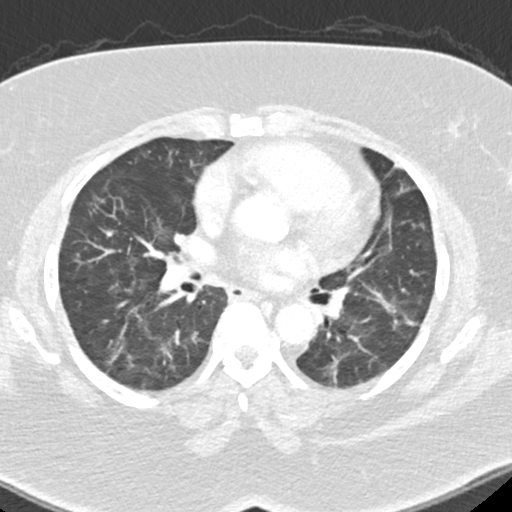
[im 93/152  lung]
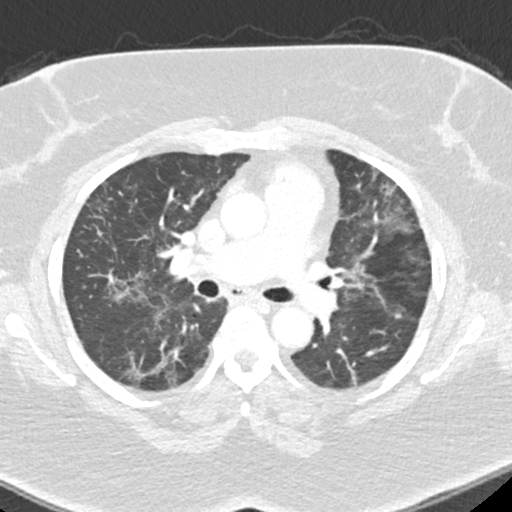
[im 105/152  mediastinal]
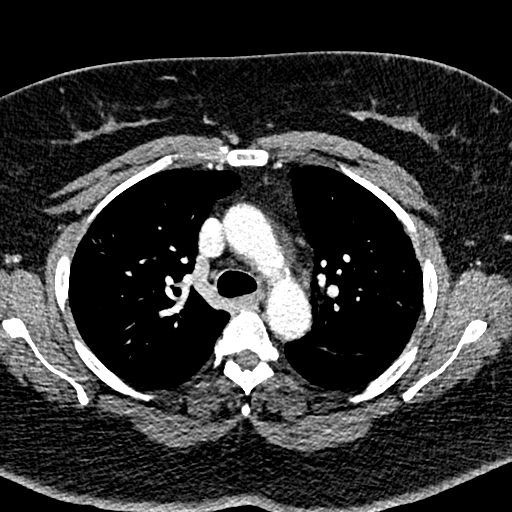
[im 105/152  lung]
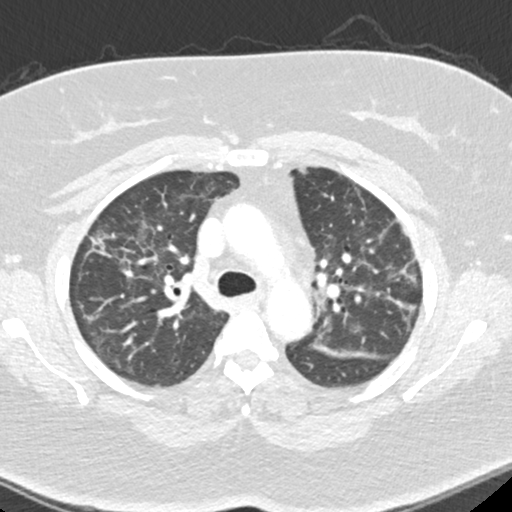
[im 117/152  lung]
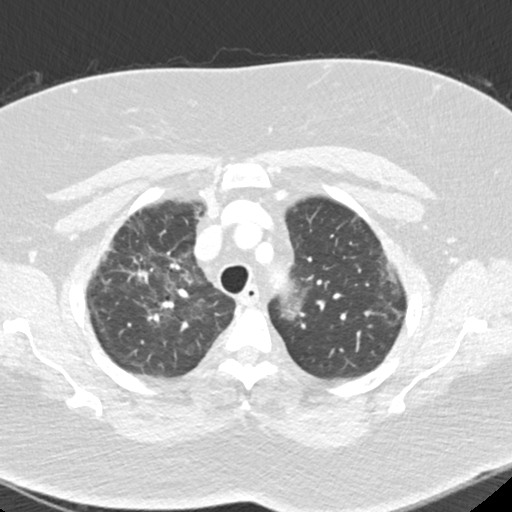
[im 128/152  lung]
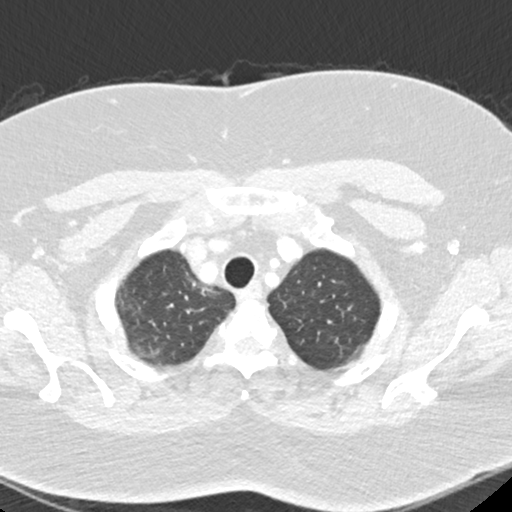
[im 140/152  lung]
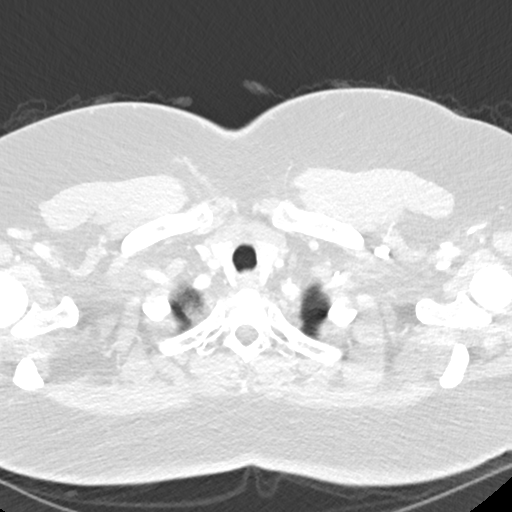

[Series 4: coronal chest 2.00 cor · coronal · 0.60mm/px · 3 of 156 slices shown]
[im 32/156  lung]
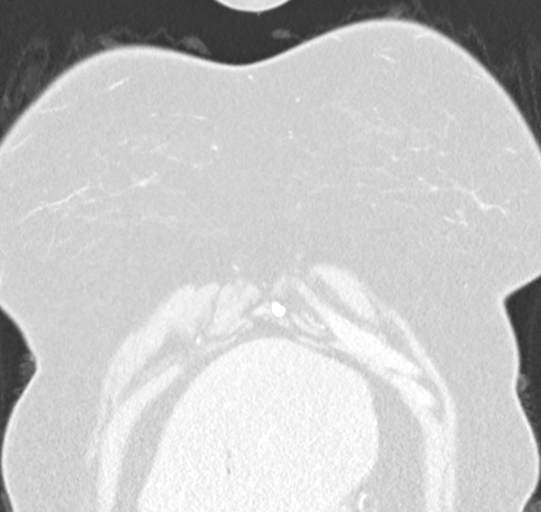
[im 63/156  lung]
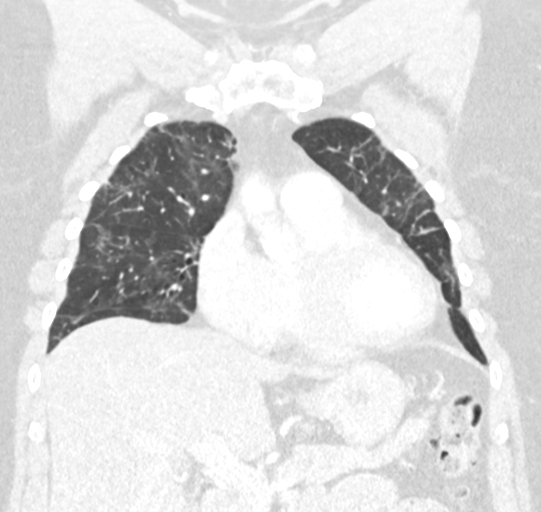
[im 94/156  lung]
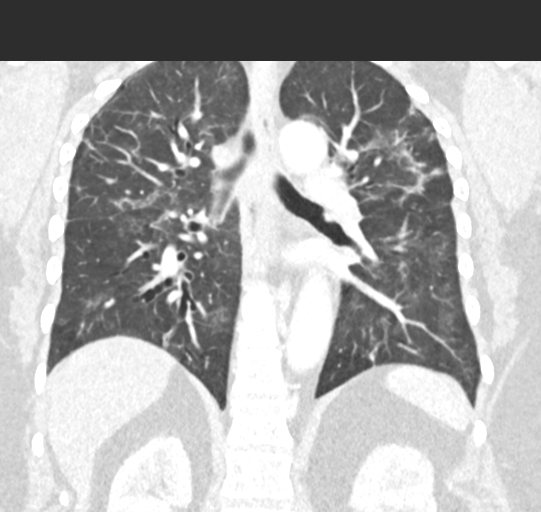

[15 of 36 positions shown; findings below may reference images not displayed]

FINDINGS: Cardiovascular: Normal cardiac size. No pericardial effusion.
Atherosclerotic plaque within the normal caliber aorta. Shared
origin of the brachiocephalic and left common carotid artery.
Minimal plaque in the otherwise unremarkable proximal great vessels.
Central pulmonary arteries are borderline enlarged. No large central
filling defects on this non tailored examination.

Mediastinum/Nodes: Scattered subcentimeter low-attenuation
mediastinal lymph nodes, likely reactive. No pathologically enlarged
mediastinal, hilar or axillary adenopathy. No acute abnormality of
the trachea or esophagus. Thyroid gland and thoracic inlet are
unremarkable.

Lungs/Pleura: There is some bandlike areas of opacity in the lungs
similar to comparison study from 0005 likely reflecting areas of
scarring. There are multifocal areas of mixed ground-glass and
reticular opacity throughout both lungs. Small fat containing right
Bochdalek's hernia.

Upper Abdomen: Small calcification along the greater curvature of
the stomach is unchanged from prior. Diffuse hepatic hypoattenuation
compatible with hepatic steatosis. No acute abnormalities present in
the visualized portions of the upper abdomen.

Musculoskeletal: Multilevel degenerative changes are present in the
imaged portions of the spine. No acute osseous abnormality or
suspicious osseous lesion.
IMPRESSION: 1. Multifocal areas of mixed ground-glass and reticular opacity
throughout both lungs. Findings strongly favor atypical infection
given the setting of known OWSQW-1G positivity. While some
ground-glass was present on comparison study from 0005, the process
on today's exam is certainly more widespread and diffuse involving
the entirety of both lungs.
2. Some more bandlike areas of opacity in the lungs likely reflect a
combination of atelectasis and scarring.
3. Small fat containing right Bochdalek's hernia.
4. Hepatic steatosis.
5.  Aortic Atherosclerosis ([id number].O).

## 2021-07-05 ENCOUNTER — Ambulatory Visit
Admission: RE | Admit: 2021-07-05 | Discharge: 2021-07-05 | Disposition: A | Discharge status: Home / Self Care | Payer: Medicare Other | Source: Ambulatory Visit | Attending: Nurse Practitioner | Admitting: Nurse Practitioner

## 2021-07-05 ENCOUNTER — Other Ambulatory Visit: Payer: Self-pay

## 2021-07-05 DIAGNOSIS — Z1231 Encounter for screening mammogram for malignant neoplasm of breast: Secondary | ICD-10-CM | POA: Insufficient documentation

## 2021-07-05 DIAGNOSIS — M8589 Other specified disorders of bone density and structure, multiple sites: Secondary | ICD-10-CM | POA: Diagnosis not present

## 2021-07-05 DIAGNOSIS — Z780 Asymptomatic menopausal state: Secondary | ICD-10-CM | POA: Diagnosis not present

## 2021-07-06 ENCOUNTER — Other Ambulatory Visit: Payer: Self-pay | Admitting: Nurse Practitioner

## 2021-07-06 DIAGNOSIS — G8929 Other chronic pain: Secondary | ICD-10-CM

## 2021-07-06 DIAGNOSIS — M0579 Rheumatoid arthritis with rheumatoid factor of multiple sites without organ or systems involvement: Secondary | ICD-10-CM

## 2021-07-07 ENCOUNTER — Other Ambulatory Visit: Payer: Self-pay | Admitting: Nurse Practitioner

## 2021-07-07 ENCOUNTER — Other Ambulatory Visit: Payer: Self-pay

## 2021-07-07 ENCOUNTER — Ambulatory Visit (INDEPENDENT_AMBULATORY_CARE_PROVIDER_SITE_OTHER): Payer: Medicare Other | Admitting: Internal Medicine

## 2021-07-07 DIAGNOSIS — N63 Unspecified lump in unspecified breast: Secondary | ICD-10-CM

## 2021-07-07 DIAGNOSIS — J449 Chronic obstructive pulmonary disease, unspecified: Secondary | ICD-10-CM

## 2021-07-07 DIAGNOSIS — R0602 Shortness of breath: Secondary | ICD-10-CM | POA: Diagnosis not present

## 2021-07-07 DIAGNOSIS — R928 Other abnormal and inconclusive findings on diagnostic imaging of breast: Secondary | ICD-10-CM

## 2021-07-08 ENCOUNTER — Telehealth: Payer: Self-pay

## 2021-07-08 ENCOUNTER — Ambulatory Visit (INDEPENDENT_AMBULATORY_CARE_PROVIDER_SITE_OTHER): Payer: Medicare Other | Admitting: Internal Medicine

## 2021-07-08 ENCOUNTER — Encounter: Payer: Self-pay | Admitting: Internal Medicine

## 2021-07-08 VITALS — BP 134/73 | HR 89 | Temp 98.4°F | Resp 16 | Ht 66.0 in | Wt 270.4 lb

## 2021-07-08 DIAGNOSIS — J449 Chronic obstructive pulmonary disease, unspecified: Secondary | ICD-10-CM

## 2021-07-08 DIAGNOSIS — G4733 Obstructive sleep apnea (adult) (pediatric): Secondary | ICD-10-CM | POA: Diagnosis not present

## 2021-07-08 DIAGNOSIS — Z7189 Other specified counseling: Secondary | ICD-10-CM | POA: Diagnosis not present

## 2021-07-08 NOTE — Patient Instructions (Signed)
Sleep Apnea ?Sleep apnea affects breathing during sleep. It causes breathing to stop for 10 seconds or more, or to become shallow. People with sleep apnea usually snore loudly. ?It can also increase the risk of: ?Heart attack. ?Stroke. ?Being very overweight (obese). ?Diabetes. ?Heart failure. ?Irregular heartbeat. ?High blood pressure. ?The goal of treatment is to help you breathe normally again. ?What are the causes? ?The most common cause of this condition is a collapsed or blocked airway. ?There are three kinds of sleep apnea: ?Obstructive sleep apnea. This is caused by a blocked or collapsed airway. ?Central sleep apnea. This happens when the brain does not send the right signals to the muscles that control breathing. ?Mixed sleep apnea. This is a combination of obstructive and central sleep apnea. ?What increases the risk? ?Being overweight. ?Smoking. ?Having a small airway. ?Being older. ?Being female. ?Drinking alcohol. ?Taking medicines to calm yourself (sedatives or tranquilizers). ?Having family members with the condition. ?Having a tongue or tonsils that are larger than normal. ?What are the signs or symptoms? ?Trouble staying asleep. ?Loud snoring. ?Headaches in the morning. ?Waking up gasping. ?Dry mouth or sore throat in the morning. ?Being sleepy or tired during the day. ?If you are sleepy or tired during the day, you may also: ?Not be able to focus your mind (concentrate). ?Forget things. ?Get angry a lot and have mood swings. ?Feel sad (depressed). ?Have changes in your personality. ?Have less interest in sex, if you are female. ?Be unable to have an erection, if you are female. ?How is this treated? ? ?Sleeping on your side. ?Using a medicine to get rid of mucus in your nose (decongestant). ?Avoiding the use of alcohol, medicines to help you relax, or certain pain medicines (narcotics). ?Losing weight, if needed. ?Changing your diet. ?Quitting smoking. ?Using a machine to open your airway while you  sleep, such as: ?An oral appliance. This is a mouthpiece that shifts your lower jaw forward. ?A CPAP device. This device blows air through a mask when you breathe out (exhale). ?An EPAP device. This has valves that you put in each nostril. ?A BIPAP device. This device blows air through a mask when you breathe in (inhale) and breathe out. ?Having surgery if other treatments do not work. ?Follow these instructions at home: ?Lifestyle ?Make changes that your doctor recommends. ?Eat a healthy diet. ?Lose weight if needed. ?Avoid alcohol, medicines to help you relax, and some pain medicines. ?Do not smoke or use any products that contain nicotine or tobacco. If you need help quitting, ask your doctor. ?General instructions ?Take over-the-counter and prescription medicines only as told by your doctor. ?If you were given a machine to use while you sleep, use it only as told by your doctor. ?If you are having surgery, make sure to tell your doctor you have sleep apnea. You may need to bring your device with you. ?Keep all follow-up visits. ?Contact a doctor if: ?The machine that you were given to use during sleep bothers you or does not seem to be working. ?You do not get better. ?You get worse. ?Get help right away if: ?Your chest hurts. ?You have trouble breathing in enough air. ?You have an uncomfortable feeling in your back, arms, or stomach. ?You have trouble talking. ?One side of your body feels weak. ?A part of your face is hanging down. ?These symptoms may be an emergency. Get help right away. Call your local emergency services (911 in the [place] ?Do not wait to see if the symptoms   will go away. ?Do not drive yourself to the hospital. ?Summary ?This condition affects breathing during sleep. ?The most common cause is a collapsed or blocked airway. ?The goal of treatment is to help you breathe normally while you sleep. ?This information is not intended to replace advice given to you by your health care provider. Make  sure you discuss any questions you have with your health care provider. ?Document Revised: 11/25/2020 Document Reviewed: 03/27/2020 ?Elsevier Patient Education ? 2022 [employer] ? ?

## 2021-07-08 NOTE — Progress Notes (Signed)
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 the next ten years. . I have reviewed this report and agree with the above findings. Mark A. Tyron Russell, M.D. [facility] Radiology Electronically Signed   By: Ulyses Southward M.D.   On: 07/05/2021 11:15   MM 3D SCREEN BREAST BILATERAL  Result Date: 07/06/2021 CLINICAL DATA:  Screening. EXAM: DIGITAL SCREENING BILATERAL MAMMOGRAM WITH TOMOSYNTHESIS AND CAD TECHNIQUE: Bilateral screening digital craniocaudal and mediolateral oblique mammograms were obtained. Bilateral screening digital breast tomosynthesis was performed. The images were evaluated with computer-aided detection. COMPARISON:  Previous exam(s). ACR Breast Density Category b: There are scattered areas of fibroglandular density. FINDINGS: In the right breast, a possible mass warrants further evaluation. In the left breast, no findings suspicious for malignancy. IMPRESSION: Further evaluation is suggested for a possible mass in the right breast. RECOMMENDATION: Diagnostic mammogram and possibly ultrasound of the right breast.  (Code:[id number]-98M) The patient will be contacted regarding the findings, and additional imaging will be scheduled. BI-RADS CATEGORY  0: Incomplete. Need additional imaging evaluation and/or prior mammograms for comparison. Electronically Signed   By: Norva Pavlov M.D.   On: 07/06/2021 12:51    DG Bone Density  Result Date: 07/05/2021 EXAM: DUAL X-RAY ABSORPTIOMETRY (DXA) FOR BONE MINERAL DENSITY IMPRESSION: Your patient Deisha Mosby completed a BMD test on 07/05/2021 using the [employer] iDXA DXA System (software version: 14.10) manufactured by [employer]. The following summarizes the results of our evaluation. Technologist:VLM PATIENT BIOGRAPHICAL: Name: Toshi, Wion Patient ID: [id number] Birth Date: 01-Apr-1961 Height: 66.0 in. Gender: Female Exam Date: 07/05/2021 Weight: 265.0 lbs. Indications: COPD, Diabetic, Postmenopausal, Rheumatoid Arthritis Fractures: Treatments: Albuterol DENSITOMETRY RESULTS: Site         Region     Measured Date Measured Age WHO Classification Young Adult T-score BMD         %Change vs. Previous Significant Change (*) AP Spine L1-L3 07/05/2021 60.9 Osteopenia -1.4 1.015 g/cm2 - - DualFemur Neck Left 07/05/2021 60.9 Osteopenia -1.3 0.856  g/cm2 - - Left Forearm Radius 33% 07/05/2021 60.9 Normal 0.1 0.884 g/cm2 - - ASSESSMENT: The BMD measured at AP Spine L1-L3 is 1.015 g/cm2 with a T-score of -1.4. This patient is considered osteopenic according to World Health Organization [facility]) criteria. L-4 was excluded due to degenerative changes. The scan quality is good. World Science writer [facility]) criteria for post-menopausal, Caucasian Women: Normal:                   T-score at or above -1 SD Osteopenia/low bone mass: T-score between -1 and -2.5 SD Osteoporosis:             T-score at or below -2.5 SD RECOMMENDATIONS: 1. All patients should optimize calcium and vitamin D intake. 2. Consider FDA-approved medical therapies in postmenopausal women and men aged 15 years and older,  based on the following: a. A hip or vertebral(clinical or morphometric) fracture b. T-score < -2.5 at the femoral neck or spine after appropriate evaluation to exclude secondary causes c. Low bone mass (T-score between -1.0 and -2.5 at the femoral neck or spine) and a 10-year probability of a hip fracture > 3% or a 10-year probability of a major osteoporosis-related fracture > 20% based on the [place]-adapted WHO algorithm 3. Clinician judgment and/or patient preferences may indicate treatment for people with 10-year fracture probabilities above or below these levels FOLLOW-UP: People with diagnosed cases of osteoporosis or at high risk for fracture should have regular bone mineral density tests. For patients eligible for Medicare, routine testing is allowed once every 2 years. The testing frequency can be increased to one year for patients who have rapidly progressing disease, those who are receiving or discontinuing medical therapy to restore bone mass, or have additional risk factors. I have reviewed this report, and agree with the above findings. Mark A. Tyron Russell, M.D. [facility] Dear Sallyanne Kuster, Your patient SHAR EMDE completed a FRAX assessment on 07/05/2021 using the [facility] iDXA DXA System (analysis version: 14.10) manufactured by [employer]. The following summarizes the results of our evaluation. PATIENT BIOGRAPHICAL: Name: Chaley, Finnell Patient ID: [id number] Birth Date: Sep 26, 1960 Height:    66.0 in. Gender:     Female    [age]       Weight:    265.0 lbs. Ethnicity:  Black                            Exam Date: 07/05/2021 FRAX* RESULTS:  (version: 3.5) 10-year Probability of Fracture1 Major Osteoporotic Fracture2 Hip Fracture 3.9% 0.3% Population: [place] (Black) Risk Factors: Rheumatoid Arthritis Based on Femur (Left) Neck BMD 1 -The 10-year probability of fracture may be lower than reported if the patient has received treatment. 2 -Major Osteoporotic Fracture: Clinical  Spine, Forearm, Hip or Shoulder *FRAX is a Armed forces logistics/support/administrative officer of the [employer] for Metabolic Bone Disease, a World Science writer (WHO) [employer]. ASSESSMENT: The probability of a major osteoporotic fracture is 3.9% within the next ten years. The probability of a hip fracture is 0.3% within the next ten years. . I have reviewed this report and agree with the above findings. Mark A. Tyron Russell, M.D. [facility] Radiology Electronically Signed   By: Ulyses Southward M.D.   On: 07/05/2021 11:15   MM 3D SCREEN BREAST BILATERAL  Result Date: 07/06/2021 CLINICAL DATA:  Screening. EXAM: DIGITAL SCREENING BILATERAL MAMMOGRAM WITH TOMOSYNTHESIS AND CAD TECHNIQUE:  Bilateral screening digital craniocaudal and mediolateral oblique mammograms were obtained. Bilateral screening digital breast tomosynthesis was performed. The images were evaluated with computer-aided detection. COMPARISON:  Previous exam(s). ACR Breast Density Category b: There are scattered areas of fibroglandular density. FINDINGS: In the right breast, a possible mass warrants further evaluation. In the left breast, no findings suspicious for malignancy. IMPRESSION: Further evaluation is suggested for a possible mass in the right breast. RECOMMENDATION: Diagnostic mammogram and possibly ultrasound of the right breast. (Code:[id number]) The patient will be contacted regarding the findings, and additional imaging will be scheduled. BI-RADS CATEGORY  0: Incomplete. Need additional imaging evaluation and/or prior mammograms for comparison. Electronically Signed   By: Norva Pavlov M.D.   On: 07/06/2021 12:51      Assessment and Plan: Patient Active Problem List   Diagnosis Date Noted   Axillary hidradenitis suppurativa 09/27/2020   Acute upper GI bleeding 02/08/2020   GIB (gastrointestinal bleeding) 02/07/2020   HLD (hyperlipidemia) 02/07/2020   Leukocytosis 02/07/2020   Morbid obesity with BMI of 40.0-44.9, adult  (HCC) 02/07/2020   Rheumatoid arthritis (HCC)    Symptomatic anemia    Nausea vomiting and diarrhea    Acquired spondylolisthesis 12/19/2019   Abscess of axilla, right 03/01/2019   Pain in right upper arm 03/01/2019   Type 2 diabetes mellitus with hyperglycemia (HCC) 03/01/2019   Long term (current) use of systemic steroids 03/01/2019   Long term (current) use of anticoagulants 01/17/2018   Appendicitis with perforation    Uncontrolled type 2 diabetes mellitus with hyperglycemia (HCC) 11/09/2017   Herpes simplex 11/09/2017   Mucopurulent chronic bronchitis (HCC) 11/09/2017   Personal history of venous thrombosis and embolism 10/06/2017   Phlebitis and thrombophlebitis of other sites 06/09/2017   Obstructive sleep apnea of adult 06/09/2017   Gout, unspecified 06/09/2017   Herpesviral vulvovaginitis 06/09/2017   Type 2 diabetes mellitus with diabetic neuropathy, unspecified (HCC) 06/09/2017   Essential (primary) hypertension 06/09/2017   Chronic anticoagulation 06/09/2017   Pain medication agreement signed 01/05/2017   Subacromial bursitis of left shoulder joint 10/19/2016   Chronic shoulder bursitis, right 06/21/2016   Chronic pain of left knee 07/14/2014   Myofascial muscle pain 07/14/2014   Low back pain 03/18/2014   Chronic obstructive pulmonary disease (HCC) 05/01/2013   Upper respiratory infection 05/01/2013   Acne 09/12/2012   Hand dermatitis 09/12/2012   DVT (deep venous thrombosis) (HCC) 08/31/2012   Constipation 08/16/2012   Gastroesophageal reflux disease without esophagitis 08/16/2012   Asthma 04/10/2012   Tobacco use disorder 04/02/2012   Morbid obesity (HCC) 03/01/2012   Dermatitis 01/11/2012   Depressive disorder 06/22/2010   Type II diabetes mellitus with renal manifestations (HCC) 06/22/2010   Encounter for current long-term use of anticoagulants 08/17/2009   1. Chronic obstructive pulmonary disease, unspecified COPD type (HCC) Reviewed the patient's  medications she should be on inhalers she currently has prescription for albuterol as needed uses it.  In addition to that she does have Singulair so the plan is going to be to continue with the albuterol Singulair trilogy  2. OSA (obstructive sleep apnea) Did recommend getting a sleep study because of excessive sleepiness symptoms and prior history of significant sleep apnea - [facility] Sleep Study; Future  3. Obesity, morbid (HCC) Obesity Counseling: Had a lengthy discussion regarding patients BMI and weight issues. Patient was instructed on portion control as well as increased activity. Also discussed caloric restrictions with trying to maintain intake less than 2000 Kcal. Discussions were made in accordance  with the 5As of weight management. Simple actions such as not eating late and if able to, taking a walk is suggested.   4. CPAP use counseling CPAP Counseling: had a lengthy discussion with the patient regarding the importance of PAP therapy in management of the sleep apnea. Patient appears to understand the risk factor reduction and also understands the risks associated with untreated sleep apnea.    General Counseling: I have discussed the findings of the evaluation and examination with Cordelia Pen.  I have also discussed any further diagnostic evaluation thatmay be needed or ordered today. Khalidah verbalizes understanding of the findings of todays visit. We also reviewed her medications today and discussed drug interactions and side effects including but not limited excessive drowsiness and altered mental states. We also discussed that there is always a risk not just to her but also people around her. she has been encouraged to call the office with any questions or concerns that should arise related to todays visit.  No orders of the defined types were placed in this encounter.    Time spent: 77  I have personally obtained a history, examined the patient, evaluated laboratory and imaging results,  formulated the assessment and plan and placed orders.    Yevonne Pax, MD [facility] Pulmonary and Critical Care Sleep medicine

## 2021-07-08 NOTE — Telephone Encounter (Signed)
SS ordered. Printed. Gave to Titania-[name] ?

## 2021-07-10 ENCOUNTER — Other Ambulatory Visit: Payer: Self-pay | Admitting: Internal Medicine

## 2021-07-10 DIAGNOSIS — G8929 Other chronic pain: Secondary | ICD-10-CM

## 2021-07-10 DIAGNOSIS — M5442 Lumbago with sciatica, left side: Secondary | ICD-10-CM

## 2021-07-10 DIAGNOSIS — M0579 Rheumatoid arthritis with rheumatoid factor of multiple sites without organ or systems involvement: Secondary | ICD-10-CM

## 2021-07-11 ENCOUNTER — Other Ambulatory Visit: Payer: Self-pay | Admitting: Internal Medicine

## 2021-07-11 DIAGNOSIS — B009 Herpesviral infection, unspecified: Secondary | ICD-10-CM

## 2021-07-11 NOTE — Procedures (Signed)
[facility] ?[place] ?[place], [address] ? ? ? ?Complete Pulmonary Function Testing Interpretation: ? ?FINDINGS: ? ?The forced vital capacity is moderately decreased the FEV1 is 1.38 L which is 61% of predicted and is moderately decreased.  FEV1 FVC ratio was normal.  Total lung capacity is mildly decreased.  Residual volume is normal residual volume total lung capacity ratio is increased.  FRC is mildly decreased.  The DLCO was moderately decreased.  Postbronchodilator there was no significant change in FEV1 clinical improvement may still occur in the absence of spirometric improvement ? ?IMPRESSION: ? ?This pulmonary function study is consistent with moderate obstructive lung disease clinical correlation is recommended ? ?Allyne Gee, MD FCCP ?Pulmonary Critical Care Medicine ?Sleep Medicine ? ?

## 2021-07-12 ENCOUNTER — Other Ambulatory Visit: Payer: Self-pay | Admitting: Nurse Practitioner

## 2021-07-12 ENCOUNTER — Telehealth: Payer: Self-pay

## 2021-07-12 DIAGNOSIS — G8929 Other chronic pain: Secondary | ICD-10-CM

## 2021-07-12 DIAGNOSIS — M0579 Rheumatoid arthritis with rheumatoid factor of multiple sites without organ or systems involvement: Secondary | ICD-10-CM

## 2021-07-12 MED ORDER — TRAMADOL HCL 50 MG PO TABS: 50.0000 mg | ORAL_TABLET | Freq: Three times a day (TID) | ORAL | 2 refills | Status: DC | PRN

## 2021-07-12 NOTE — Telephone Encounter (Signed)
She should discuss this with provider, she needs to be seen every 3 months for diabetes and med refill

## 2021-07-13 ENCOUNTER — Other Ambulatory Visit: Payer: Self-pay

## 2021-07-13 MED ORDER — TRAMADOL HCL 50 MG PO TABS: 50.0000 mg | ORAL_TABLET | Freq: Three times a day (TID) | ORAL | 0 refills | Status: DC

## 2021-07-13 NOTE — Telephone Encounter (Signed)
Per DFK we changed pt's rx qty for tramadol to 270  tablets with 0 refills due to being cheaper for pt's insurance.  Pharmacist advised that pt can't fill rx til 07/17/21  ?

## 2021-07-15 ENCOUNTER — Ambulatory Visit: Payer: Medicare Other | Admitting: Internal Medicine

## 2021-07-16 ENCOUNTER — Ambulatory Visit (INDEPENDENT_AMBULATORY_CARE_PROVIDER_SITE_OTHER): Payer: Medicare Other | Admitting: Nurse Practitioner

## 2021-07-16 ENCOUNTER — Encounter: Payer: Self-pay | Admitting: Internal Medicine

## 2021-07-16 ENCOUNTER — Encounter: Payer: Self-pay | Admitting: Nurse Practitioner

## 2021-07-16 ENCOUNTER — Other Ambulatory Visit: Payer: Self-pay

## 2021-07-16 VITALS — BP 160/100 | HR 88 | Temp 98.4°F | Resp 16 | Ht 66.0 in | Wt 269.0 lb

## 2021-07-16 DIAGNOSIS — I10 Essential (primary) hypertension: Secondary | ICD-10-CM

## 2021-07-16 DIAGNOSIS — K219 Gastro-esophageal reflux disease without esophagitis: Secondary | ICD-10-CM | POA: Diagnosis not present

## 2021-07-16 DIAGNOSIS — J449 Chronic obstructive pulmonary disease, unspecified: Secondary | ICD-10-CM | POA: Diagnosis not present

## 2021-07-16 LAB — PULMONARY FUNCTION TEST

## 2021-07-16 MED ORDER — TRELEGY ELLIPTA 100-62.5-25 MCG/ACT IN AEPB: 1.0000 | INHALATION_SPRAY | Freq: Every day | RESPIRATORY_TRACT | 3 refills | Status: DC
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
## 2021-07-20 ENCOUNTER — Ambulatory Visit: Payer: Medicare Other | Admitting: Nurse Practitioner

## 2021-07-23 DIAGNOSIS — H52223 Regular astigmatism, bilateral: Secondary | ICD-10-CM | POA: Diagnosis not present

## 2021-07-25 ENCOUNTER — Encounter: Payer: Self-pay | Admitting: Nurse Practitioner

## 2021-07-27 ENCOUNTER — Other Ambulatory Visit: Payer: Medicare Other

## 2021-07-27 ENCOUNTER — Ambulatory Visit: Payer: Medicare Other

## 2021-07-28 ENCOUNTER — Other Ambulatory Visit: Payer: Self-pay | Admitting: Nurse Practitioner

## 2021-07-28 DIAGNOSIS — G8929 Other chronic pain: Secondary | ICD-10-CM

## 2021-07-28 NOTE — Telephone Encounter (Signed)
Med sent to pharmacy.

## 2021-07-28 NOTE — Telephone Encounter (Signed)
Please lasy 3/23 and next 5/23 ?

## 2021-07-28 NOTE — Telephone Encounter (Signed)
Last 07/16/21 and next 09/20/21 ?

## 2021-08-08 ENCOUNTER — Other Ambulatory Visit: Payer: Self-pay | Admitting: Internal Medicine

## 2021-08-08 DIAGNOSIS — K279 Peptic ulcer, site unspecified, unspecified as acute or chronic, without hemorrhage or perforation: Secondary | ICD-10-CM

## 2021-08-09 ENCOUNTER — Other Ambulatory Visit: Payer: Self-pay | Admitting: Nurse Practitioner

## 2021-08-09 ENCOUNTER — Ambulatory Visit: Payer: 59 | Admitting: Dermatology

## 2021-08-09 DIAGNOSIS — L732 Hidradenitis suppurativa: Secondary | ICD-10-CM

## 2021-08-09 DIAGNOSIS — L720 Epidermal cyst: Secondary | ICD-10-CM

## 2021-08-10 NOTE — Telephone Encounter (Signed)
med sent to pharmacy ?

## 2021-08-11 ENCOUNTER — Ambulatory Visit
Admission: RE | Admit: 2021-08-11 | Discharge: 2021-08-11 | Disposition: A | Discharge status: Home / Self Care | Payer: Medicare Other | Source: Ambulatory Visit | Attending: Nurse Practitioner | Admitting: Nurse Practitioner

## 2021-08-11 DIAGNOSIS — N63 Unspecified lump in unspecified breast: Secondary | ICD-10-CM

## 2021-08-11 DIAGNOSIS — R928 Other abnormal and inconclusive findings on diagnostic imaging of breast: Secondary | ICD-10-CM | POA: Insufficient documentation

## 2021-08-11 DIAGNOSIS — N6314 Unspecified lump in the right breast, lower inner quadrant: Secondary | ICD-10-CM | POA: Diagnosis not present

## 2021-08-11 DIAGNOSIS — N6001 Solitary cyst of right breast: Secondary | ICD-10-CM | POA: Diagnosis not present

## 2021-08-12 ENCOUNTER — Telehealth: Payer: Self-pay

## 2021-08-12 NOTE — Telephone Encounter (Signed)
Patient declined to schedule appointment. "She stated she knows she would never be able to use a cpap machine. tat ?

## 2021-08-21 ENCOUNTER — Other Ambulatory Visit: Payer: Self-pay | Admitting: Nurse Practitioner

## 2021-08-21 DIAGNOSIS — G8929 Other chronic pain: Secondary | ICD-10-CM

## 2021-08-25 ENCOUNTER — Other Ambulatory Visit: Payer: Self-pay | Admitting: Nurse Practitioner

## 2021-08-27 ENCOUNTER — Telehealth: Payer: Self-pay

## 2021-08-27 MED ORDER — FLUCONAZOLE 150 MG PO TABS: ORAL_TABLET | ORAL | 0 refills | Status: DC

## 2021-08-27 NOTE — Telephone Encounter (Signed)
Pt called and c/o having burning and itching on outside of vaginal area, maybe some mild discharge.  She had same issue in feb and was given diflucan.  Per Alyssa I sent Diflucan 150 mg x 3 to her pharmacy.  Informed pt that if no better after treatment that she may need to come in for a visit. ?

## 2021-08-29 ENCOUNTER — Other Ambulatory Visit: Payer: Self-pay | Admitting: Nurse Practitioner

## 2021-08-29 DIAGNOSIS — G8929 Other chronic pain: Secondary | ICD-10-CM

## 2021-08-30 NOTE — Telephone Encounter (Signed)
Med sent to pharmacy.

## 2021-09-05 ENCOUNTER — Other Ambulatory Visit: Payer: Self-pay | Admitting: Nurse Practitioner

## 2021-09-05 DIAGNOSIS — G8929 Other chronic pain: Secondary | ICD-10-CM

## 2021-09-06 ENCOUNTER — Other Ambulatory Visit: Payer: Self-pay | Admitting: Nurse Practitioner

## 2021-09-06 DIAGNOSIS — L732 Hidradenitis suppurativa: Secondary | ICD-10-CM

## 2021-09-06 DIAGNOSIS — E782 Mixed hyperlipidemia: Secondary | ICD-10-CM

## 2021-09-06 DIAGNOSIS — L720 Epidermal cyst: Secondary | ICD-10-CM

## 2021-09-09 ENCOUNTER — Other Ambulatory Visit: Payer: Self-pay | Admitting: Nurse Practitioner

## 2021-09-10 ENCOUNTER — Telehealth: Payer: Self-pay

## 2021-09-10 NOTE — Telephone Encounter (Signed)
Completed medical records for [facility] ?Payment request of $3.75 and records faxed to [phone], phone number [phone] ? ? ?

## 2021-09-13 ENCOUNTER — Other Ambulatory Visit: Payer: Self-pay | Admitting: Nurse Practitioner

## 2021-09-13 ENCOUNTER — Telehealth: Payer: Self-pay

## 2021-09-13 ENCOUNTER — Encounter: Payer: Self-pay | Admitting: Internal Medicine

## 2021-09-13 ENCOUNTER — Ambulatory Visit (INDEPENDENT_AMBULATORY_CARE_PROVIDER_SITE_OTHER): Payer: Medicare Other | Admitting: Internal Medicine

## 2021-09-13 VITALS — BP 138/88 | HR 75 | Temp 98.5°F | Resp 16 | Ht 66.0 in | Wt 269.0 lb

## 2021-09-13 DIAGNOSIS — M5441 Lumbago with sciatica, right side: Secondary | ICD-10-CM

## 2021-09-13 DIAGNOSIS — G8929 Other chronic pain: Secondary | ICD-10-CM | POA: Diagnosis not present

## 2021-09-13 DIAGNOSIS — M5442 Lumbago with sciatica, left side: Secondary | ICD-10-CM

## 2021-09-13 DIAGNOSIS — J449 Chronic obstructive pulmonary disease, unspecified: Secondary | ICD-10-CM | POA: Diagnosis not present

## 2021-09-13 DIAGNOSIS — G4733 Obstructive sleep apnea (adult) (pediatric): Secondary | ICD-10-CM | POA: Diagnosis not present

## 2021-09-13 MED ORDER — TRAMADOL HCL 50 MG PO TABS: 50.0000 mg | ORAL_TABLET | Freq: Three times a day (TID) | ORAL | 0 refills | Status: DC

## 2021-09-13 NOTE — Patient Instructions (Signed)
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
## 2021-09-14 NOTE — Telephone Encounter (Signed)
LMOM to inform pt med was sent  ?

## 2021-09-20 ENCOUNTER — Encounter: Payer: Self-pay | Admitting: Nurse Practitioner

## 2021-09-20 ENCOUNTER — Ambulatory Visit (INDEPENDENT_AMBULATORY_CARE_PROVIDER_SITE_OTHER): Payer: Medicare Other | Admitting: Nurse Practitioner

## 2021-09-20 VITALS — BP 132/78 | HR 79 | Temp 98.6°F | Resp 16 | Ht 66.0 in | Wt 272.6 lb

## 2021-09-20 DIAGNOSIS — I10 Essential (primary) hypertension: Secondary | ICD-10-CM

## 2021-09-20 DIAGNOSIS — J449 Chronic obstructive pulmonary disease, unspecified: Secondary | ICD-10-CM

## 2021-09-20 DIAGNOSIS — E1165 Type 2 diabetes mellitus with hyperglycemia: Secondary | ICD-10-CM

## 2021-09-20 LAB — POCT GLYCOSYLATED HEMOGLOBIN (HGB A1C): Hemoglobin A1C: 9.4 % — AB (ref 4.0–5.6)

## 2021-09-20 MED ORDER — TIRZEPATIDE 5 MG/0.5ML ~~LOC~~ SOAJ: 5.0000 mg | SUBCUTANEOUS | 1 refills | Status: DC

## 2021-09-20 MED ORDER — IPRATROPIUM-ALBUTEROL 0.5-2.5 (3) MG/3ML IN SOLN: 3.0000 mL | Freq: Four times a day (QID) | RESPIRATORY_TRACT | 1 refills | Status: DC | PRN
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
## 2021-10-01 ENCOUNTER — Other Ambulatory Visit: Payer: Self-pay | Admitting: Nurse Practitioner

## 2021-10-01 DIAGNOSIS — G8929 Other chronic pain: Secondary | ICD-10-CM

## 2021-10-01 NOTE — Telephone Encounter (Signed)
Last 5/23 and 7/23

## 2021-10-07 ENCOUNTER — Other Ambulatory Visit: Payer: Self-pay | Admitting: Nurse Practitioner

## 2021-10-07 DIAGNOSIS — L732 Hidradenitis suppurativa: Secondary | ICD-10-CM

## 2021-10-10 ENCOUNTER — Other Ambulatory Visit: Payer: Self-pay | Admitting: Nurse Practitioner

## 2021-10-10 DIAGNOSIS — E559 Vitamin D deficiency, unspecified: Secondary | ICD-10-CM

## 2021-10-25 ENCOUNTER — Other Ambulatory Visit: Payer: Self-pay | Admitting: Nurse Practitioner

## 2021-10-25 ENCOUNTER — Encounter: Payer: Self-pay | Admitting: Nurse Practitioner

## 2021-10-25 ENCOUNTER — Ambulatory Visit (INDEPENDENT_AMBULATORY_CARE_PROVIDER_SITE_OTHER): Payer: Medicare Other | Admitting: Nurse Practitioner

## 2021-10-25 VITALS — BP 140/89 | HR 73 | Temp 98.3°F | Resp 16 | Ht 66.0 in | Wt 269.0 lb

## 2021-10-25 DIAGNOSIS — E1165 Type 2 diabetes mellitus with hyperglycemia: Secondary | ICD-10-CM | POA: Diagnosis not present

## 2021-10-25 DIAGNOSIS — R600 Localized edema: Secondary | ICD-10-CM | POA: Diagnosis not present

## 2021-10-25 DIAGNOSIS — B3731 Acute candidiasis of vulva and vagina: Secondary | ICD-10-CM

## 2021-10-25 DIAGNOSIS — B351 Tinea unguium: Secondary | ICD-10-CM | POA: Diagnosis not present

## 2021-10-25 DIAGNOSIS — Z7901 Long term (current) use of anticoagulants: Secondary | ICD-10-CM

## 2021-10-25 DIAGNOSIS — J449 Chronic obstructive pulmonary disease, unspecified: Secondary | ICD-10-CM | POA: Diagnosis not present

## 2021-10-25 DIAGNOSIS — I10 Essential (primary) hypertension: Secondary | ICD-10-CM | POA: Diagnosis not present

## 2021-10-25 DIAGNOSIS — Z6841 Body Mass Index (BMI) 40.0 and over, adult: Secondary | ICD-10-CM

## 2021-10-25 MED ORDER — TOPIRAMATE 25 MG PO TABS: 25.0000 mg | ORAL_TABLET | Freq: Every day | ORAL | 1 refills | Status: DC

## 2021-10-25 MED ORDER — TIRZEPATIDE 5 MG/0.5ML ~~LOC~~ SOAJ: 5.0000 mg | SUBCUTANEOUS | 2 refills | Status: DC

## 2021-10-25 MED ORDER — METOPROLOL TARTRATE 50 MG PO TABS: 50.0000 mg | ORAL_TABLET | Freq: Two times a day (BID) | ORAL | 3 refills | Status: DC

## 2021-10-25 MED ORDER — GLIMEPIRIDE 2 MG PO TABS: ORAL_TABLET | ORAL | 1 refills | Status: DC

## 2021-10-25 MED ORDER — MONTELUKAST SODIUM 10 MG PO TABS: 10.0000 mg | ORAL_TABLET | Freq: Every day | ORAL | 1 refills | Status: DC

## 2021-10-25 MED ORDER — BUPROPION HCL ER (XL) 300 MG PO TB24: 300.0000 mg | ORAL_TABLET | Freq: Every day | ORAL | 1 refills | Status: DC

## 2021-10-25 MED ORDER — APIXABAN 5 MG PO TABS: 5.0000 mg | ORAL_TABLET | Freq: Two times a day (BID) | ORAL | 1 refills | Status: DC

## 2021-10-25 MED ORDER — FLUCONAZOLE 150 MG PO TABS: ORAL_TABLET | ORAL | 2 refills | Status: DC

## 2021-10-25 MED ORDER — ALBUTEROL SULFATE HFA 108 (90 BASE) MCG/ACT IN AERS: 2.0000 | INHALATION_SPRAY | RESPIRATORY_TRACT | 1 refills | Status: DC | PRN

## 2021-10-25 MED ORDER — FUROSEMIDE 20 MG PO TABS: 20.0000 mg | ORAL_TABLET | Freq: Every day | ORAL | 1 refills | Status: DC

## 2021-10-25 MED ORDER — TERBINAFINE HCL 250 MG PO TABS: 250.0000 mg | ORAL_TABLET | Freq: Every day | ORAL | 0 refills | Status: DC

## 2021-11-03 ENCOUNTER — Other Ambulatory Visit: Payer: Self-pay | Admitting: Nurse Practitioner

## 2021-11-03 DIAGNOSIS — G8929 Other chronic pain: Secondary | ICD-10-CM

## 2021-11-03 NOTE — Telephone Encounter (Signed)
Med sent.

## 2021-11-16 ENCOUNTER — Ambulatory Visit (INDEPENDENT_AMBULATORY_CARE_PROVIDER_SITE_OTHER): Payer: Medicare Other | Admitting: Internal Medicine

## 2021-11-16 ENCOUNTER — Encounter: Payer: Self-pay | Admitting: Internal Medicine

## 2021-11-16 VITALS — BP 137/77 | HR 78 | Temp 98.0°F | Resp 16 | Ht 66.0 in | Wt 271.4 lb

## 2021-11-16 DIAGNOSIS — Z7952 Long term (current) use of systemic steroids: Secondary | ICD-10-CM | POA: Diagnosis not present

## 2021-11-16 DIAGNOSIS — M25552 Pain in left hip: Secondary | ICD-10-CM

## 2021-11-16 DIAGNOSIS — E1165 Type 2 diabetes mellitus with hyperglycemia: Secondary | ICD-10-CM

## 2021-11-16 DIAGNOSIS — M5416 Radiculopathy, lumbar region: Secondary | ICD-10-CM

## 2021-11-16 LAB — GLUCOSE, POCT (MANUAL RESULT ENTRY): POC Glucose: 149 mg/dl — AB (ref 70–99)
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
## 2021-11-17 DIAGNOSIS — M5116 Intervertebral disc disorders with radiculopathy, lumbar region: Secondary | ICD-10-CM | POA: Diagnosis not present

## 2021-11-17 DIAGNOSIS — M5136 Other intervertebral disc degeneration, lumbar region: Secondary | ICD-10-CM | POA: Diagnosis not present

## 2021-11-17 DIAGNOSIS — M5416 Radiculopathy, lumbar region: Secondary | ICD-10-CM | POA: Diagnosis not present

## 2021-11-17 DIAGNOSIS — M4316 Spondylolisthesis, lumbar region: Secondary | ICD-10-CM | POA: Diagnosis not present

## 2021-11-17 DIAGNOSIS — M4807 Spinal stenosis, lumbosacral region: Secondary | ICD-10-CM | POA: Diagnosis not present

## 2021-11-18 ENCOUNTER — Other Ambulatory Visit: Payer: Self-pay

## 2021-11-18 ENCOUNTER — Other Ambulatory Visit: Payer: Self-pay | Admitting: Family Medicine

## 2021-11-18 DIAGNOSIS — M5416 Radiculopathy, lumbar region: Secondary | ICD-10-CM

## 2021-11-18 MED ORDER — FAMOTIDINE 20 MG PO TABS: 20.0000 mg | ORAL_TABLET | Freq: Every day | ORAL | 1 refills | Status: DC

## 2021-11-19 ENCOUNTER — Other Ambulatory Visit: Payer: Self-pay

## 2021-11-19 MED ORDER — DEXCOM G7 RECEIVER DEVI: 3 refills | Status: DC

## 2021-11-19 MED ORDER — DEXCOM G7 SENSOR MISC: 3 refills | Status: DC

## 2021-11-22 ENCOUNTER — Other Ambulatory Visit: Payer: Self-pay | Admitting: Nurse Practitioner

## 2021-11-22 DIAGNOSIS — K279 Peptic ulcer, site unspecified, unspecified as acute or chronic, without hemorrhage or perforation: Secondary | ICD-10-CM

## 2021-11-22 DIAGNOSIS — B351 Tinea unguium: Secondary | ICD-10-CM

## 2021-11-22 DIAGNOSIS — B009 Herpesviral infection, unspecified: Secondary | ICD-10-CM

## 2021-11-26 ENCOUNTER — Telehealth: Payer: Self-pay

## 2021-11-26 NOTE — Telephone Encounter (Signed)
Completed medical records for [facility] Mailed to [place] [address] Payment request of $158.75 faxed to [phone], phone number [phone] Spoke with paralegal Orlin Hilding, informed her of invoice amount and that records would be mailed. 635 pages

## 2021-11-29 ENCOUNTER — Other Ambulatory Visit: Payer: Medicare Other

## 2021-11-29 DIAGNOSIS — M48062 Spinal stenosis, lumbar region with neurogenic claudication: Secondary | ICD-10-CM | POA: Diagnosis not present

## 2021-11-29 DIAGNOSIS — E1165 Type 2 diabetes mellitus with hyperglycemia: Secondary | ICD-10-CM | POA: Diagnosis not present

## 2021-12-03 ENCOUNTER — Other Ambulatory Visit: Payer: Self-pay | Admitting: Nurse Practitioner

## 2021-12-03 DIAGNOSIS — G8929 Other chronic pain: Secondary | ICD-10-CM

## 2021-12-06 ENCOUNTER — Other Ambulatory Visit: Payer: Medicare Other

## 2021-12-07 NOTE — Telephone Encounter (Signed)
Med refills sent

## 2021-12-11 ENCOUNTER — Other Ambulatory Visit: Payer: Self-pay | Admitting: Nurse Practitioner

## 2021-12-13 ENCOUNTER — Other Ambulatory Visit: Payer: Self-pay

## 2021-12-13 DIAGNOSIS — E1165 Type 2 diabetes mellitus with hyperglycemia: Secondary | ICD-10-CM

## 2021-12-13 MED ORDER — TIRZEPATIDE 5 MG/0.5ML ~~LOC~~ SOAJ: 5.0000 mg | SUBCUTANEOUS | 2 refills | Status: DC

## 2021-12-14 ENCOUNTER — Other Ambulatory Visit: Payer: Self-pay | Admitting: Nurse Practitioner

## 2021-12-14 MED ORDER — TRAMADOL HCL 50 MG PO TABS: 50.0000 mg | ORAL_TABLET | Freq: Three times a day (TID) | ORAL | 0 refills | Status: DC

## 2021-12-16 ENCOUNTER — Other Ambulatory Visit: Payer: Self-pay

## 2021-12-16 ENCOUNTER — Telehealth: Payer: Self-pay

## 2021-12-16 DIAGNOSIS — B3731 Acute candidiasis of vulva and vagina: Secondary | ICD-10-CM

## 2021-12-16 MED ORDER — FLUCONAZOLE 150 MG PO TABS: ORAL_TABLET | ORAL | 3 refills | Status: DC

## 2021-12-16 NOTE — Telephone Encounter (Signed)
Pt came into office and brought all her medications she is taking and I went thru them and made sure we had all the medications on her chart.  Gave pt a list of her medications and advised to keep at home and if any changes happen to her medications she can mark on her list to keep up with them

## 2021-12-21 ENCOUNTER — Other Ambulatory Visit: Payer: Self-pay | Admitting: Nurse Practitioner

## 2021-12-21 DIAGNOSIS — L732 Hidradenitis suppurativa: Secondary | ICD-10-CM

## 2021-12-21 DIAGNOSIS — L720 Epidermal cyst: Secondary | ICD-10-CM

## 2021-12-21 DIAGNOSIS — J449 Chronic obstructive pulmonary disease, unspecified: Secondary | ICD-10-CM

## 2021-12-27 ENCOUNTER — Encounter: Payer: Self-pay | Admitting: Nurse Practitioner

## 2021-12-27 ENCOUNTER — Ambulatory Visit (INDEPENDENT_AMBULATORY_CARE_PROVIDER_SITE_OTHER): Payer: Medicare Other | Admitting: Nurse Practitioner

## 2021-12-27 VITALS — BP 130/76 | HR 79 | Temp 98.3°F | Resp 16 | Ht 66.0 in | Wt 272.0 lb

## 2021-12-27 DIAGNOSIS — E1165 Type 2 diabetes mellitus with hyperglycemia: Secondary | ICD-10-CM | POA: Diagnosis not present

## 2021-12-27 DIAGNOSIS — Z7952 Long term (current) use of systemic steroids: Secondary | ICD-10-CM | POA: Diagnosis not present

## 2021-12-27 DIAGNOSIS — I10 Essential (primary) hypertension: Secondary | ICD-10-CM

## 2021-12-27 LAB — POCT GLYCOSYLATED HEMOGLOBIN (HGB A1C): Hemoglobin A1C: 10 % — AB (ref 4.0–5.6)

## 2021-12-27 MED ORDER — TIRZEPATIDE 7.5 MG/0.5ML ~~LOC~~ SOAJ: 7.5000 mg | SUBCUTANEOUS | 1 refills | Status: DC
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
## 2021-12-30 ENCOUNTER — Other Ambulatory Visit: Payer: Self-pay | Admitting: Internal Medicine

## 2021-12-30 DIAGNOSIS — B351 Tinea unguium: Secondary | ICD-10-CM

## 2022-01-08 ENCOUNTER — Other Ambulatory Visit: Payer: Self-pay | Admitting: Nurse Practitioner

## 2022-01-08 DIAGNOSIS — G8929 Other chronic pain: Secondary | ICD-10-CM

## 2022-01-17 ENCOUNTER — Telehealth: Payer: Self-pay | Admitting: Nurse Practitioner

## 2022-01-17 DIAGNOSIS — J441 Chronic obstructive pulmonary disease with (acute) exacerbation: Secondary | ICD-10-CM | POA: Diagnosis not present

## 2022-01-17 NOTE — Telephone Encounter (Signed)
Patient was referred to [facility] back in January. Patient cancelled appointment due to not needed.  Now they can't get her in for quite sometime. Dermatology referral sent via [facility] to [facility] in [facility]

## 2022-01-19 ENCOUNTER — Telehealth: Payer: Self-pay | Admitting: Nurse Practitioner

## 2022-01-19 NOTE — Telephone Encounter (Signed)
Received call from patient regarding dermatology referral. I explained to her it had been sent through [place] to [facility] dermatology in [facility] 01/17/22. Gave her telephone # to call. She called back stating they had not received referral. I called office to let them know it had been sent through [employer]. They will call me back to verify-[name]

## 2022-01-21 DIAGNOSIS — L729 Follicular cyst of the skin and subcutaneous tissue, unspecified: Secondary | ICD-10-CM | POA: Diagnosis not present

## 2022-01-24 ENCOUNTER — Encounter: Payer: Self-pay | Admitting: Nurse Practitioner

## 2022-01-24 ENCOUNTER — Ambulatory Visit (INDEPENDENT_AMBULATORY_CARE_PROVIDER_SITE_OTHER): Payer: Medicare Other | Admitting: Nurse Practitioner

## 2022-01-24 VITALS — BP 120/70 | HR 80 | Temp 97.1°F | Resp 16 | Ht 66.0 in | Wt 296.6 lb

## 2022-01-24 DIAGNOSIS — Z6841 Body Mass Index (BMI) 40.0 and over, adult: Secondary | ICD-10-CM | POA: Diagnosis not present

## 2022-01-24 DIAGNOSIS — Z23 Encounter for immunization: Secondary | ICD-10-CM

## 2022-01-24 DIAGNOSIS — E1165 Type 2 diabetes mellitus with hyperglycemia: Secondary | ICD-10-CM | POA: Diagnosis not present

## 2022-01-24 DIAGNOSIS — I10 Essential (primary) hypertension: Secondary | ICD-10-CM | POA: Diagnosis not present
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
## 2022-01-25 ENCOUNTER — Telehealth: Payer: Self-pay | Admitting: Nurse Practitioner

## 2022-01-25 NOTE — Telephone Encounter (Signed)
Dermatology appointment 01/22/3231 with [name]

## 2022-02-02 ENCOUNTER — Telehealth: Payer: Self-pay | Admitting: Nurse Practitioner

## 2022-02-02 NOTE — Telephone Encounter (Signed)
MR dating back to 2010 mailed to [facility] at [address] [place], [place]

## 2022-02-14 ENCOUNTER — Encounter: Payer: Self-pay | Admitting: Nurse Practitioner

## 2022-02-14 ENCOUNTER — Ambulatory Visit (INDEPENDENT_AMBULATORY_CARE_PROVIDER_SITE_OTHER): Payer: Medicare Other | Admitting: Nurse Practitioner

## 2022-02-14 VITALS — BP 141/72 | HR 79 | Temp 97.8°F | Resp 16 | Ht 66.0 in | Wt 275.0 lb

## 2022-02-14 DIAGNOSIS — R600 Localized edema: Secondary | ICD-10-CM

## 2022-02-14 DIAGNOSIS — M5410 Radiculopathy, site unspecified: Secondary | ICD-10-CM | POA: Diagnosis not present
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
## 2022-02-15 LAB — CMP14+EGFR
ALT: 24 IU/L (ref 0–32)
AST: 28 IU/L (ref 0–40)
Albumin/Globulin Ratio: 1.5 (ref 1.2–2.2)
Albumin: 4 g/dL (ref 3.9–4.9)
Alkaline Phosphatase: 132 IU/L — ABNORMAL HIGH (ref 44–121)
BUN/Creatinine Ratio: 11 — ABNORMAL LOW (ref 12–28)
BUN: 12 mg/dL (ref 8–27)
Bilirubin Total: 0.3 mg/dL (ref 0.0–1.2)
CO2: 23 mmol/L (ref 20–29)
Calcium: 9.5 mg/dL (ref 8.7–10.3)
Chloride: 104 mmol/L (ref 96–106)
Creatinine, Ser: 1.09 mg/dL — ABNORMAL HIGH (ref 0.57–1.00)
Globulin, Total: 2.6 g/dL (ref 1.5–4.5)
Glucose: 169 mg/dL — ABNORMAL HIGH (ref 70–99)
Potassium: 3.9 mmol/L (ref 3.5–5.2)
Sodium: 141 mmol/L (ref 134–144)
Total Protein: 6.6 g/dL (ref 6.0–8.5)
eGFR: 58 mL/min/{1.73_m2} — ABNORMAL LOW (ref >59)

## 2022-02-15 LAB — MAGNESIUM: Magnesium: 2 mg/dL (ref 1.6–2.3)

## 2022-02-16 ENCOUNTER — Telehealth: Payer: Self-pay | Admitting: Nurse Practitioner

## 2022-02-16 NOTE — Telephone Encounter (Signed)
Well, her uncontrolled diabetes is starting to effect her kidneys, she needs better control, if she needs to be seen sooner, I can see her but she needs to bring all her medicine

## 2022-02-17 ENCOUNTER — Ambulatory Visit: Payer: Medicare Other | Admitting: Nurse Practitioner

## 2022-02-17 NOTE — Telephone Encounter (Signed)
Pt had appt today.

## 2022-02-18 ENCOUNTER — Other Ambulatory Visit: Payer: Self-pay | Admitting: Nurse Practitioner

## 2022-02-18 ENCOUNTER — Ambulatory Visit
Admission: RE | Admit: 2022-02-18 | Discharge: 2022-02-18 | Disposition: A | Discharge status: Home / Self Care | Payer: Medicare Other | Source: Ambulatory Visit | Attending: Family Medicine | Admitting: Family Medicine

## 2022-02-18 DIAGNOSIS — M48061 Spinal stenosis, lumbar region without neurogenic claudication: Secondary | ICD-10-CM | POA: Diagnosis not present

## 2022-02-18 DIAGNOSIS — M545 Low back pain, unspecified: Secondary | ICD-10-CM | POA: Diagnosis not present

## 2022-02-18 DIAGNOSIS — M4316 Spondylolisthesis, lumbar region: Secondary | ICD-10-CM | POA: Diagnosis not present

## 2022-02-18 DIAGNOSIS — M5416 Radiculopathy, lumbar region: Secondary | ICD-10-CM

## 2022-02-18 NOTE — Telephone Encounter (Signed)
Can I see her as well after this visit

## 2022-02-19 ENCOUNTER — Other Ambulatory Visit: Payer: Self-pay

## 2022-02-19 ENCOUNTER — Emergency Department: Payer: Medicare Other

## 2022-02-19 ENCOUNTER — Encounter: Payer: Self-pay | Admitting: Emergency Medicine

## 2022-02-19 ENCOUNTER — Emergency Department
Admission: EM | Admit: 2022-02-19 | Discharge: 2022-02-19 | Disposition: A | Discharge status: Home / Self Care | Payer: Medicare Other | Attending: Emergency Medicine | Admitting: Emergency Medicine

## 2022-02-19 ENCOUNTER — Other Ambulatory Visit: Payer: Self-pay | Admitting: Nurse Practitioner

## 2022-02-19 DIAGNOSIS — I509 Heart failure, unspecified: Secondary | ICD-10-CM | POA: Insufficient documentation

## 2022-02-19 DIAGNOSIS — E1140 Type 2 diabetes mellitus with diabetic neuropathy, unspecified: Secondary | ICD-10-CM | POA: Diagnosis not present

## 2022-02-19 DIAGNOSIS — M7989 Other specified soft tissue disorders: Secondary | ICD-10-CM

## 2022-02-19 DIAGNOSIS — Z7901 Long term (current) use of anticoagulants: Secondary | ICD-10-CM | POA: Diagnosis not present

## 2022-02-19 DIAGNOSIS — R600 Localized edema: Secondary | ICD-10-CM | POA: Insufficient documentation

## 2022-02-19 DIAGNOSIS — I110 Hypertensive heart disease with heart failure: Secondary | ICD-10-CM | POA: Diagnosis not present

## 2022-02-19 DIAGNOSIS — R0602 Shortness of breath: Secondary | ICD-10-CM | POA: Diagnosis not present

## 2022-02-19 DIAGNOSIS — J45909 Unspecified asthma, uncomplicated: Secondary | ICD-10-CM | POA: Insufficient documentation

## 2022-02-19 DIAGNOSIS — R609 Edema, unspecified: Secondary | ICD-10-CM

## 2022-02-19 DIAGNOSIS — R2240 Localized swelling, mass and lump, unspecified lower limb: Secondary | ICD-10-CM | POA: Diagnosis not present

## 2022-02-19 DIAGNOSIS — R079 Chest pain, unspecified: Secondary | ICD-10-CM | POA: Diagnosis not present

## 2022-02-19 LAB — CBC WITH DIFFERENTIAL/PLATELET
Abs Immature Granulocytes: 0.01 10*3/uL (ref 0.00–0.07)
Basophils Absolute: 0 10*3/uL (ref 0.0–0.1)
Basophils Relative: 1 %
Eosinophils Absolute: 0.2 10*3/uL (ref 0.0–0.5)
Eosinophils Relative: 3 %
HCT: 45.6 % (ref 36.0–46.0)
Hemoglobin: 14.2 g/dL (ref 12.0–15.0)
Immature Granulocytes: 0 %
Lymphocytes Relative: 31 %
Lymphs Abs: 1.6 10*3/uL (ref 0.7–4.0)
MCH: 31.3 pg (ref 26.0–34.0)
MCHC: 31.1 g/dL (ref 30.0–36.0)
MCV: 100.7 fL — ABNORMAL HIGH (ref 80.0–100.0)
Monocytes Absolute: 0.5 10*3/uL (ref 0.1–1.0)
Monocytes Relative: 10 %
Neutro Abs: 2.9 10*3/uL (ref 1.7–7.7)
Neutrophils Relative %: 55 %
Platelets: 324 10*3/uL (ref 150–400)
RBC: 4.53 MIL/uL (ref 3.87–5.11)
RDW: 14.6 % (ref 11.5–15.5)
WBC: 5.2 10*3/uL (ref 4.0–10.5)
nRBC: 0 % (ref 0.0–0.2)

## 2022-02-19 LAB — BASIC METABOLIC PANEL
Anion gap: 7 (ref 5–15)
BUN: 10 mg/dL (ref 8–23)
CO2: 25 mmol/L (ref 22–32)
Calcium: 9.7 mg/dL (ref 8.9–10.3)
Chloride: 105 mmol/L (ref 98–111)
Creatinine, Ser: 1.07 mg/dL — ABNORMAL HIGH (ref 0.44–1.00)
GFR, Estimated: 59 mL/min — ABNORMAL LOW (ref >60)
Glucose, Bld: 124 mg/dL — ABNORMAL HIGH (ref 70–99)
Potassium: 3.4 mmol/L — ABNORMAL LOW (ref 3.5–5.1)
Sodium: 137 mmol/L (ref 135–145)

## 2022-02-19 LAB — TROPONIN I (HIGH SENSITIVITY): Troponin I (High Sensitivity): 7 ng/L (ref <18)

## 2022-02-19 LAB — BRAIN NATRIURETIC PEPTIDE: B Natriuretic Peptide: 7.9 pg/mL (ref 0.0–100.0)

## 2022-02-19 MED ORDER — FUROSEMIDE 20 MG PO TABS: 20.0000 mg | ORAL_TABLET | Freq: Every day | ORAL | 0 refills | Status: DC

## 2022-02-19 NOTE — ED Provider Notes (Signed)
[facility] Provider Note    Event Date/Time   First MD Initiated Contact with Patient 02/19/22 1456     (approximate)   History   Leg Swelling   HPI  Brenda Santana is a 61 y.o. female with a past medical history of CHF,, morbid obesity, type 2 diabetes, DVT on Eliquis who presents today for evaluation of bilateral lower extremity edema.  She reports that this has been ongoing for 7 days.  She denies shortness of breath.  She reports that her right leg swells more than her left which is typical for her, but her left leg hurts more.  She denies chest pain or trouble breathing.  She reports that she always has orthopnea, this is not any different today.  She denies any weight loss or weight gain.  No fevers or chills.  No cough.  No injuries.  No back pain currently.  She reports that she had an MRI yesterday but is unsure what the results are.  Patient Active Problem List   Diagnosis Date Noted   Axillary hidradenitis suppurativa 09/27/2020   Acute upper GI bleeding 02/08/2020   GIB (gastrointestinal bleeding) 02/07/2020   HLD (hyperlipidemia) 02/07/2020   Leukocytosis 02/07/2020   Morbid obesity with BMI of 40.0-44.9, adult ([place]) 02/07/2020   Rheumatoid arthritis (HCC)    Symptomatic anemia    Nausea vomiting and diarrhea    Acquired spondylolisthesis 12/19/2019   Abscess of axilla, right 03/01/2019   Pain in right upper arm 03/01/2019   Type 2 diabetes mellitus with hyperglycemia ([place]) 03/01/2019   Long term (current) use of systemic steroids 03/01/2019   Long term (current) use of anticoagulants 01/17/2018   Appendicitis with perforation    Uncontrolled type 2 diabetes mellitus with hyperglycemia ([place]) 11/09/2017   Herpes simplex 11/09/2017   Mucopurulent chronic bronchitis ([place]) 11/09/2017   Personal history of venous thrombosis and embolism 10/06/2017   Phlebitis and thrombophlebitis of other sites 06/09/2017   Obstructive sleep apnea of adult  06/09/2017   Gout, unspecified 06/09/2017   Herpesviral vulvovaginitis 06/09/2017   Type 2 diabetes mellitus with diabetic neuropathy, unspecified ([place]) 06/09/2017   Essential (primary) hypertension 06/09/2017   Chronic anticoagulation 06/09/2017   Pain medication agreement signed 01/05/2017   Subacromial bursitis of left shoulder joint 10/19/2016   Chronic shoulder bursitis, right 06/21/2016   Chronic pain of left knee 07/14/2014   Myofascial muscle pain 07/14/2014   Low back pain 03/18/2014   Chronic obstructive pulmonary disease ([place]) 05/01/2013   Upper respiratory infection 05/01/2013   Acne 09/12/2012   Hand dermatitis 09/12/2012   DVT (deep venous thrombosis) ([place]) 08/31/2012   Constipation 08/16/2012   Gastroesophageal reflux disease without esophagitis 08/16/2012   Asthma 04/10/2012   Tobacco use disorder 04/02/2012   Morbid obesity ([place]) 03/01/2012   Dermatitis 01/11/2012   Depressive disorder 06/22/2010   Type II diabetes mellitus with renal manifestations ([place]) 06/22/2010   Encounter for current long-term use of anticoagulants 08/17/2009           Physical Exam   Triage Vital Signs: ED Triage Vitals  Enc Vitals Group     BP 02/19/22 1306 134/81     Pulse Rate 02/19/22 1306 88     Resp 02/19/22 1306 16     Temp 02/19/22 1306 98 F (36.7 C)     Temp Source 02/19/22 1306 Oral     SpO2 02/19/22 1307 99 %     Weight 02/19/22 1305 274 lb 14.6  oz (124.7 kg)     Height 02/19/22 1305 '5\' 6"'$  (1.676 m)     Head Circumference --      Peak Flow --      Pain Score 02/19/22 1305 8     Pain Loc --      Pain Edu? --      Excl. in [place]? --     Most recent vital signs: Vitals:   02/19/22 1307 02/19/22 1452  BP:  (!) 142/85  Pulse:  72  Resp:  18  Temp: 98.2 F (36.8 C) 98.5 F (36.9 C)  SpO2: 99% 100%    Physical Exam Vitals and nursing note reviewed.  Constitutional:      General: Awake and alert. No acute distress.    Appearance: Normal appearance. The  patient is obese.  HENT:     Head: Normocephalic and atraumatic.     Mouth: Mucous membranes are moist.  Eyes:     General: PERRL. Normal EOMs        Right eye: No discharge.        Left eye: No discharge.     Conjunctiva/sclera: Conjunctivae normal.  Cardiovascular:     Rate and Rhythm: Normal rate and regular rhythm.     Pulses: Normal pulses.  Pulmonary:     Effort: Pulmonary effort is normal. No respiratory distress.     Breath sounds: Normal breath sounds.  Able to speak easily in complete sentences Abdominal:     Abdomen is soft. There is no abdominal tenderness. No rebound or guarding. No distention. Musculoskeletal:        General: No swelling. Normal range of motion.     Cervical back: Normal range of motion and neck supple.  2+ pitting edema to bilateral legs.  Normal pedal pulses Back: No midline tenderness. Strength and sensation 5/5 to bilateral lower extremities. Normal great toe extension against resistance. Normal sensation throughout feet.  Skin:    General: Skin is warm and dry.     Capillary Refill: Capillary refill takes less than 2 seconds.     Findings: No rash.  Neurological:     Mental Status: The patient is awake and alert.      ED Results / Procedures / Treatments   Labs (all labs ordered are listed, but only abnormal results are displayed) Labs Reviewed  BASIC METABOLIC PANEL - Abnormal; Notable for the following components:      Result Value   Potassium 3.4 (*)    Glucose, Bld 124 (*)    Creatinine, Ser 1.07 (*)    GFR, Estimated 59 (*)    All other components within normal limits  CBC WITH DIFFERENTIAL/PLATELET - Abnormal; Notable for the following components:   MCV 100.7 (*)    All other components within normal limits  BRAIN NATRIURETIC PEPTIDE  URINALYSIS, ROUTINE W REFLEX MICROSCOPIC  TROPONIN I (HIGH SENSITIVITY)  TROPONIN I (HIGH SENSITIVITY)     EKG     RADIOLOGY I independently reviewed and interpreted imaging and agree  with radiologists findings.     PROCEDURES:  Critical Care performed:   Procedures   MEDICATIONS ORDERED IN ED: Medications - No data to display   IMPRESSION / MDM / [place] / ED COURSE  I reviewed the triage vital signs and the nursing notes.   Differential diagnosis includes, but is not limited to, volume overload, congestive heart failure, DVT, lumbar radiculopathy, acute coronary syndrome.  Patient is awake and alert, hemodynamically stable and afebrile.  She has normal oxygen saturation 100% on room air and demonstrates no increased work of breathing.  She is able to speak easily in complete sentences.  I attempted to review her MRI from yesterday as she requested, however the results have not yet been released.  She has no bowel or bladder incontinence or retention or weakness in her extremities to suggest cord compression. Labs obtained in triage are overall reassuring.  She has a negative troponin.  Her symptoms of been ongoing for 7 days, no indication for repeat troponin.  She currently has no chest pain.  BNP is also reassuring.  She does have mild pulmonary edema and 2+ pitting edema bilateral lower extremities.  She is able to ambulate with a steady gait and has no dyspnea on exertion.  She is requesting a medicine to help with the swelling in her legs.  She was given a small amount of Lasix as she has not taken this in quite some time.  She was also advised that she can wear compression stockings.  Duplex ultrasounds bilaterally are negative for DVT.  Patient is requesting discharge home and therefore I reevaluated her in triage.  I recommended that she stay longer until she can be formally evaluated in a room, though she declined.  We discussed strict return precautions and the importance of close outpatient follow-up.  Patient understands and agrees with plan.  She was discharged in stable condition.   Patient's presentation is most consistent with acute  presentation with potential threat to life or bodily function.   Clinical Course as of 02/19/22 1554  Sat Feb 19, 2022  1455 Patient is requesting discharge. Reports that she has no CP/SOB [JP]    Clinical Course User Index [JP] Kiari Hosmer, Clarnce Flock, PA-C     FINAL CLINICAL IMPRESSION(S) / ED DIAGNOSES   Final diagnoses:  Peripheral edema  Leg swelling     Rx / DC Orders   ED Discharge Orders          Ordered    furosemide (LASIX) 20 MG tablet  Daily        02/19/22 1458             Note:  This document was prepared using Dragon voice recognition software and may include unintentional dictation errors.   Emeline Gins 02/19/22 1554    Rada Hay, MD 02/19/22 [phone]

## 2022-02-19 NOTE — ED Triage Notes (Addendum)
First Nurse Note:  Arrives from [facility] for ED evaluation of bilateral lower leg pain and swelling x 7 days.  Patient with history of DVT, currently taking Eliquis.  AAOx3.  Skin warm and dry. NAD.  No SOB/ DOE

## 2022-02-19 NOTE — Discharge Instructions (Addendum)
You may take the medication as prescribed to help the swelling in your legs. Return for any new, worsening, or change in symptoms or other concerns including trouble breathing, chest pain, weakness, or any other concerns.

## 2022-02-19 NOTE — ED Provider Triage Note (Signed)
Emergency Medicine Provider Triage Evaluation Note  SHAQUETTA ARCOS , a 61 y.o. female  was evaluated in triage.  Pt complains of bilateral leg swelling x1 week. Reports right leg swells more than her left, but her left hurts more. Also reports SOB. H/o DVT, on eliquis. Pain is worse with walking or standing. No chest pain. No CP with deep inspiration. Orthopnea at baseline.  Patient Active Problem List   Diagnosis Date Noted   Axillary hidradenitis suppurativa 09/27/2020   Acute upper GI bleeding 02/08/2020   GIB (gastrointestinal bleeding) 02/07/2020   HLD (hyperlipidemia) 02/07/2020   Leukocytosis 02/07/2020   Morbid obesity with BMI of 40.0-44.9, adult ([place]) 02/07/2020   Rheumatoid arthritis (HCC)    Symptomatic anemia    Nausea vomiting and diarrhea    Acquired spondylolisthesis 12/19/2019   Abscess of axilla, right 03/01/2019   Pain in right upper arm 03/01/2019   Type 2 diabetes mellitus with hyperglycemia ([place]) 03/01/2019   Long term (current) use of systemic steroids 03/01/2019   Long term (current) use of anticoagulants 01/17/2018   Appendicitis with perforation    Uncontrolled type 2 diabetes mellitus with hyperglycemia ([place]) 11/09/2017   Herpes simplex 11/09/2017   Mucopurulent chronic bronchitis ([place]) 11/09/2017   Personal history of venous thrombosis and embolism 10/06/2017   Phlebitis and thrombophlebitis of other sites 06/09/2017   Obstructive sleep apnea of adult 06/09/2017   Gout, unspecified 06/09/2017   Herpesviral vulvovaginitis 06/09/2017   Type 2 diabetes mellitus with diabetic neuropathy, unspecified ([place]) 06/09/2017   Essential (primary) hypertension 06/09/2017   Chronic anticoagulation 06/09/2017   Pain medication agreement signed 01/05/2017   Subacromial bursitis of left shoulder joint 10/19/2016   Chronic shoulder bursitis, right 06/21/2016   Chronic pain of left knee 07/14/2014   Myofascial muscle pain 07/14/2014   Low back pain 03/18/2014   Chronic  obstructive pulmonary disease ([place]) 05/01/2013   Upper respiratory infection 05/01/2013   Acne 09/12/2012   Hand dermatitis 09/12/2012   DVT (deep venous thrombosis) ([place]) 08/31/2012   Constipation 08/16/2012   Gastroesophageal reflux disease without esophagitis 08/16/2012   Asthma 04/10/2012   Tobacco use disorder 04/02/2012   Morbid obesity ([place]) 03/01/2012   Dermatitis 01/11/2012   Depressive disorder 06/22/2010   Type II diabetes mellitus with renal manifestations ([place]) 06/22/2010   Encounter for current long-term use of anticoagulants 08/17/2009     Review of Systems  Positive: SOB, leg swelling Negative: CP  Physical Exam  There were no vitals taken for this visit. Gen:   Awake, no distress   Resp:  Normal effort  MSK:   Moves extremities without difficulty  Other:    Medical Decision Making  Medically screening exam initiated at 1:03 PM.  Appropriate orders placed.  CAPRICIA SERDA was informed that the remainder of the evaluation will be completed by another provider, this initial triage assessment does not replace that evaluation, and the importance of remaining in the ED until their evaluation is complete.     Marquette Old, PA-C 02/19/22 1308

## 2022-02-23 DIAGNOSIS — M5416 Radiculopathy, lumbar region: Secondary | ICD-10-CM | POA: Diagnosis not present

## 2022-02-23 DIAGNOSIS — M160 Bilateral primary osteoarthritis of hip: Secondary | ICD-10-CM | POA: Diagnosis not present

## 2022-02-23 DIAGNOSIS — M25552 Pain in left hip: Secondary | ICD-10-CM | POA: Diagnosis not present

## 2022-02-23 DIAGNOSIS — M25551 Pain in right hip: Secondary | ICD-10-CM | POA: Diagnosis not present

## 2022-02-23 DIAGNOSIS — M48062 Spinal stenosis, lumbar region with neurogenic claudication: Secondary | ICD-10-CM | POA: Diagnosis not present

## 2022-02-23 DIAGNOSIS — M5136 Other intervertebral disc degeneration, lumbar region: Secondary | ICD-10-CM | POA: Diagnosis not present

## 2022-02-23 DIAGNOSIS — R1030 Lower abdominal pain, unspecified: Secondary | ICD-10-CM | POA: Diagnosis not present

## 2022-02-24 ENCOUNTER — Ambulatory Visit: Payer: Medicare Other | Admitting: Nurse Practitioner

## 2022-02-24 ENCOUNTER — Telehealth: Payer: Self-pay | Admitting: Nurse Practitioner

## 2022-02-24 NOTE — Telephone Encounter (Signed)
Patient discharged from practice due to noncompliant. Letter mailed to patient-Brenda Santana

## 2022-02-25 ENCOUNTER — Telehealth: Payer: Self-pay | Admitting: Nurse Practitioner

## 2022-02-25 NOTE — Telephone Encounter (Signed)
Patient called this morning to r/s 02/24/22 missed appointment. I explained to her, per provider, she has been discharged from our care and we will prescribe her 1 month of medication until she finds new pcp. Patient stared arguing with me stating she has not missed or cancelled very many appointments and wants to talk to someone "over" me. I explained this was not my decision, it was providers. She stated she will call her insurance to find other provider. Patient later called back, arguing again. She put daughter on phone. She asked if it was office policy to discharge patient after only a couple of missed/cancelled appointments. I explained to her she is allowed only so many of each, and that patient signed office policy when she started coming here, so she is aware. Patient got back on phone to see if she is able to go to [facility] for A1c check per [facility]. Per cma, she is not due again till end of November. Called patient back, lvm-[name]

## 2022-02-27 ENCOUNTER — Other Ambulatory Visit: Payer: Self-pay | Admitting: Nurse Practitioner

## 2022-02-27 DIAGNOSIS — E782 Mixed hyperlipidemia: Secondary | ICD-10-CM

## 2022-02-28 DIAGNOSIS — R739 Hyperglycemia, unspecified: Secondary | ICD-10-CM | POA: Diagnosis not present

## 2022-03-02 ENCOUNTER — Other Ambulatory Visit: Payer: Self-pay | Admitting: Nurse Practitioner

## 2022-03-02 DIAGNOSIS — L720 Epidermal cyst: Secondary | ICD-10-CM

## 2022-03-02 DIAGNOSIS — L732 Hidradenitis suppurativa: Secondary | ICD-10-CM

## 2022-03-04 ENCOUNTER — Telehealth: Payer: Self-pay | Admitting: Nurse Practitioner

## 2022-03-04 NOTE — Telephone Encounter (Signed)
Patient called this morning stating she is having hard time finding new pcp. I went on her insurance website and found primary doctors who accept her insurance and emailed to her. She called back stating new pcp cannot get her an appointment until March and is wondering what she is to do about her medications. I explained to her we can still supply her for one month of her being discharged. She stated she will be out of medications before she can see new pcp. I will discuss with Alyssa and call her back-[name]

## 2022-03-07 ENCOUNTER — Other Ambulatory Visit: Payer: Self-pay

## 2022-03-07 ENCOUNTER — Emergency Department: Payer: Medicare Other

## 2022-03-07 ENCOUNTER — Emergency Department
Admission: EM | Admit: 2022-03-07 | Discharge: 2022-03-07 | Disposition: A | Discharge status: Home / Self Care | Payer: Medicare Other | Attending: Emergency Medicine | Admitting: Emergency Medicine

## 2022-03-07 DIAGNOSIS — S8991XA Unspecified injury of right lower leg, initial encounter: Secondary | ICD-10-CM | POA: Diagnosis present

## 2022-03-07 DIAGNOSIS — E119 Type 2 diabetes mellitus without complications: Secondary | ICD-10-CM | POA: Diagnosis not present

## 2022-03-07 DIAGNOSIS — S8391XA Sprain of unspecified site of right knee, initial encounter: Secondary | ICD-10-CM | POA: Insufficient documentation

## 2022-03-07 DIAGNOSIS — M25561 Pain in right knee: Secondary | ICD-10-CM | POA: Diagnosis not present

## 2022-03-07 DIAGNOSIS — X509XXA Other and unspecified overexertion or strenuous movements or postures, initial encounter: Secondary | ICD-10-CM | POA: Insufficient documentation

## 2022-03-07 DIAGNOSIS — I10 Essential (primary) hypertension: Secondary | ICD-10-CM | POA: Insufficient documentation

## 2022-03-07 MED ORDER — OXYCODONE-ACETAMINOPHEN 5-325 MG PO TABS
1.0000 | ORAL_TABLET | Freq: Once | ORAL | Status: AC
  Administered 2022-03-07: 1 via ORAL
  Filled 2022-03-07: qty 1

## 2022-03-07 NOTE — Discharge Instructions (Signed)
Follow-up with your regular doctor as needed.  Follow-up with orthopedics [facility] in 1 week.  Apply ice.  Wear the Ace wrap.  Bear weight as tolerated.

## 2022-03-07 NOTE — ED Triage Notes (Signed)
Pt to ED via POV from home. Pt reports has been having issues with her legs. Pt states she was going down a flight a steps and states she made a misstep and to prevent herself from falling she put all the pressure on her right leg. Pt states it felt like her right lower leg twisted. Pt reports pain in right lower leg and more so behind right knee. Pt is on blood thinners.

## 2022-03-07 NOTE — ED Provider Notes (Signed)
[facility] Provider Note    Event Date/Time   First MD Initiated Contact with Patient 03/07/22 0818     (approximate)   History   No chief complaint on file.   HPI  Brenda Santana is a 61 y.o. female with history of hypertension, DVT, rheumatoid arthritis, diabetes presents emergency department with right leg pain.  Patient states it hurts right below the knee causing the rest of the leg to hurt.  States she did twisted.  No calf pain.  No chest pain or shortness of breath      Physical Exam   Triage Vital Signs: ED Triage Vitals  Enc Vitals Group     BP 03/07/22 0754 138/68     Pulse Rate 03/07/22 0753 91     Resp 03/07/22 0753 18     Temp 03/07/22 0753 97.9 F (36.6 C)     Temp Source 03/07/22 0753 Oral     SpO2 03/07/22 0753 100 %     Weight 03/07/22 0817 274 lb 14.6 oz (124.7 kg)     Height 03/07/22 0817 '5\' 6"'$  (1.676 m)     Head Circumference --      Peak Flow --      Pain Score 03/07/22 0753 9     Pain Loc --      Pain Edu? --      Excl. in [place]? --     Most recent vital signs: Vitals:   03/07/22 0753 03/07/22 0754  BP:  138/68  Pulse: 91   Resp: 18   Temp: 97.9 F (36.6 C)   SpO2: 100%      General: Awake, no distress.   CV:  Good peripheral perfusion. regular rate and  rhythm Resp:  Normal effort.  Abd:  No distention.   Other:  Right knee is tender at the distal aspect, no posterior calf tenderness, tib-fib from midshaft down is not tender to palpation   ED Results / Procedures / Treatments   Labs (all labs ordered are listed, but only abnormal results are displayed) Labs Reviewed - No data to display   EKG     RADIOLOGY X-ray of the right knee    PROCEDURES:   Procedures   MEDICATIONS ORDERED IN ED: Medications  oxyCODONE-acetaminophen (PERCOCET/ROXICET) 5-325 MG per tablet 1 tablet (1 tablet Oral Given 03/07/22 0825)     IMPRESSION / MDM / ASSESSMENT AND PLAN / ED COURSE  I reviewed the  triage vital signs and the nursing notes.                              Differential diagnosis includes, but is not limited to, sprain, effusion, fracture, DVT  Patient's presentation is most consistent with acute complicated illness / injury requiring diagnostic workup.   Feel that DVT is less likely as patient is not tender along the posterior calf   X-ray of the right knee independently reviewed interpreted by me as being negative for any acute abnormality.  I did explain these findings to the patient.  She was given an Ace wrap, walker for comfort.  Work note.  She is to take over-the-counter Tylenol and ibuprofen for pain.  Elevate and ice.  Patient is in agreement treatment plan.  She will follow-up with orthopedics if not improving 1 week.   FINAL CLINICAL IMPRESSION(S) / ED DIAGNOSES   Final diagnoses:  Sprain of right knee, unspecified ligament, initial  encounter     Rx / DC Orders   ED Discharge Orders     None        Note:  This document was prepared using Dragon voice recognition software and may include unintentional dictation errors.    Versie Starks, PA-C 03/07/22 Carron Curie    Lavonia Drafts, MD 03/07/22 [phone]

## 2022-03-07 NOTE — ED Notes (Signed)
See triage note  Presents with pain to posterior right leg  States she missed a step on Saturday.Twisted her leg  Area is tender behind knee

## 2022-03-09 ENCOUNTER — Other Ambulatory Visit: Payer: Self-pay | Admitting: Nurse Practitioner

## 2022-03-10 ENCOUNTER — Other Ambulatory Visit: Payer: Self-pay

## 2022-03-10 ENCOUNTER — Telehealth: Payer: Self-pay

## 2022-03-10 DIAGNOSIS — B009 Herpesviral infection, unspecified: Secondary | ICD-10-CM

## 2022-03-10 MED ORDER — LINACLOTIDE 145 MCG PO CAPS: ORAL_CAPSULE | ORAL | 0 refills | Status: DC

## 2022-03-10 MED ORDER — TOPIRAMATE 25 MG PO TABS: 25.0000 mg | ORAL_TABLET | Freq: Every day | ORAL | 0 refills | Status: DC

## 2022-03-10 MED ORDER — ACYCLOVIR 400 MG PO TABS: 400.0000 mg | ORAL_TABLET | Freq: Two times a day (BID) | ORAL | 0 refills | Status: DC

## 2022-03-11 ENCOUNTER — Other Ambulatory Visit: Payer: Self-pay | Admitting: Nurse Practitioner

## 2022-03-11 DIAGNOSIS — M5442 Lumbago with sciatica, left side: Secondary | ICD-10-CM

## 2022-03-11 MED ORDER — ACETAMINOPHEN-CODEINE 300-30 MG PO TABS: 1.0000 | ORAL_TABLET | Freq: Four times a day (QID) | ORAL | 0 refills | Status: DC | PRN

## 2022-03-11 MED ORDER — TRAMADOL HCL 50 MG PO TABS: 50.0000 mg | ORAL_TABLET | Freq: Three times a day (TID) | ORAL | 0 refills | Status: DC

## 2022-03-14 ENCOUNTER — Ambulatory Visit: Payer: Medicare Other | Admitting: Internal Medicine

## 2022-03-15 IMAGING — DX DG CHEST 1V PORT
1 series · 1 of 1 positions shown · non-contrast
Comparison: November 14, 2019.

CLINICAL DATA: Shortness of breath.

EXAM:
PORTABLE CHEST 1 VIEW

[chest ap]
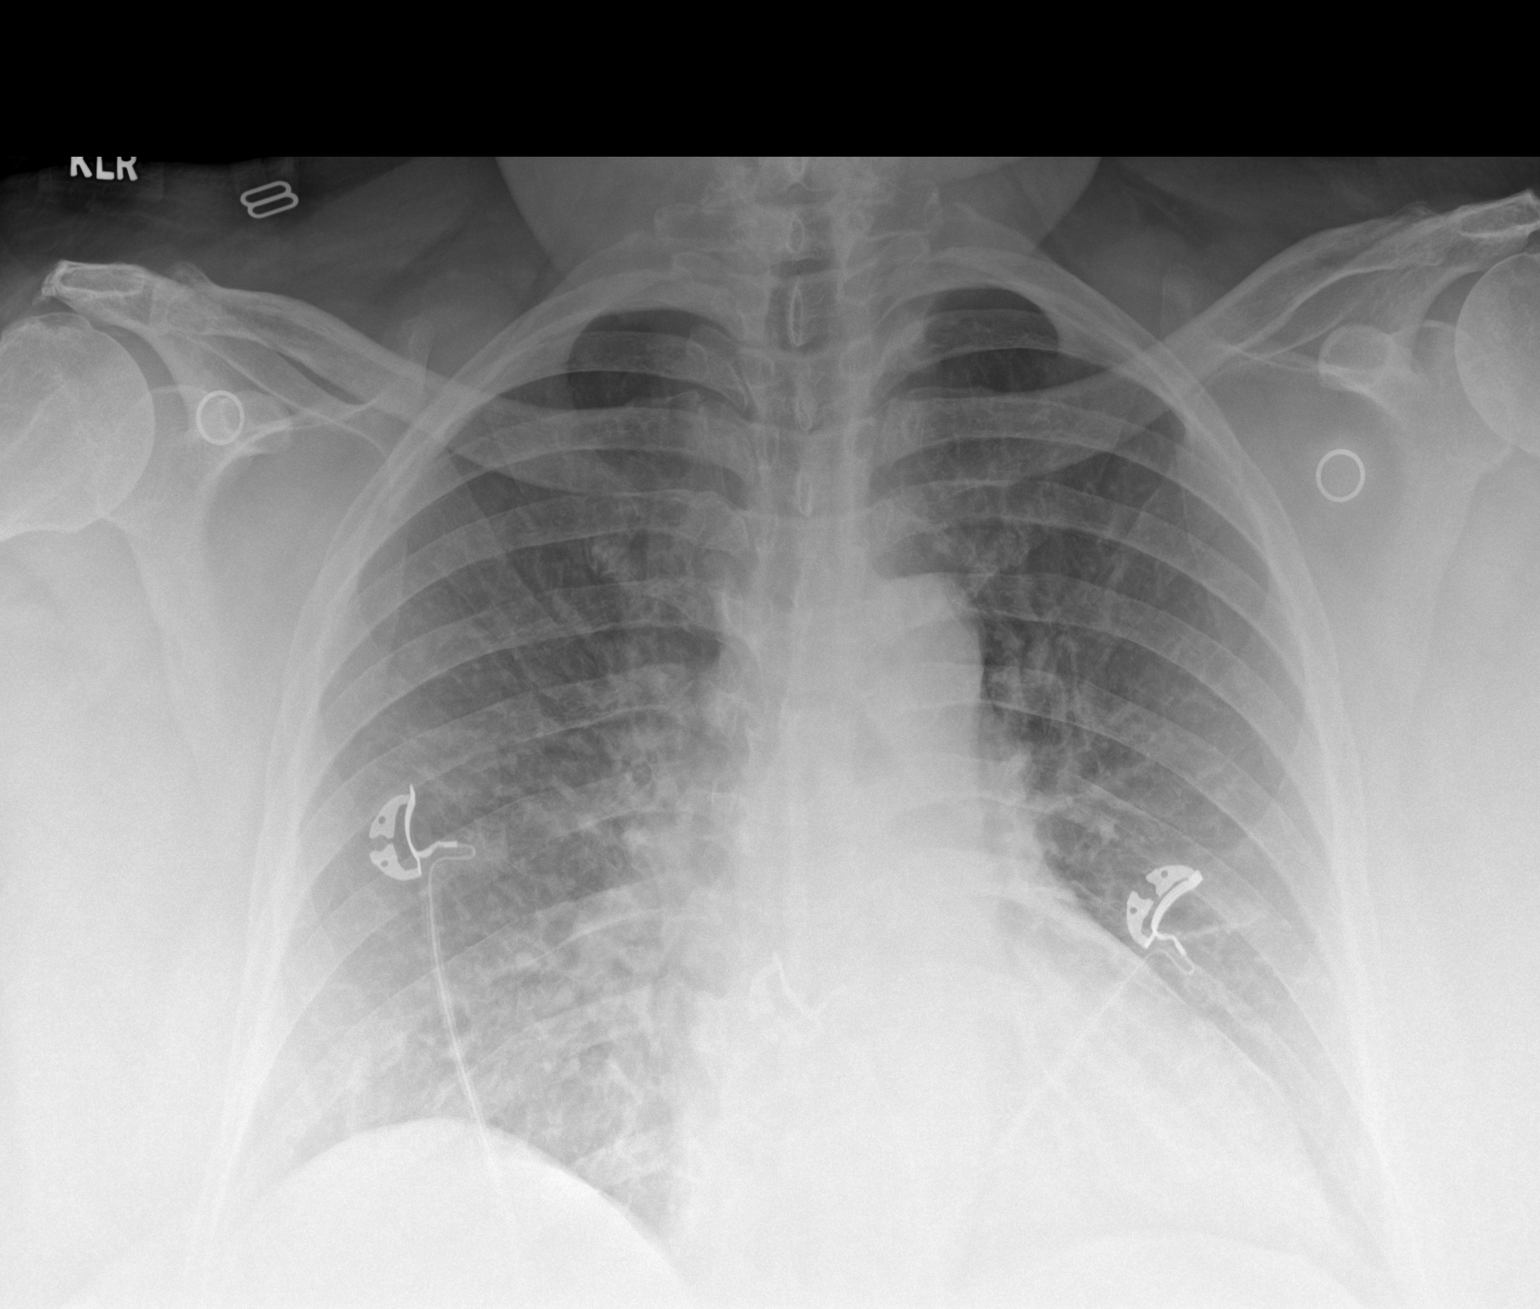

[1 of 1 positions shown; findings below may reference images not displayed]

FINDINGS: The heart size and mediastinal contours are within normal limits. No
pneumothorax or pleural effusion is noted. Both lungs are clear. The
visualized skeletal structures are unremarkable.
IMPRESSION: No active disease.

## 2022-03-15 IMAGING — CT CT ABD-PELV W/O CM
3 of 5 series · 16 of 46 positions shown, 18 images · non-contrast
Comparison: 12/05/2017

CLINICAL DATA: Acute abdominal pain beginning yesterday. Nausea.
Diarrhea.

EXAM:
CT ABDOMEN AND PELVIS WITHOUT CONTRAST
TECHNIQUE: Multidetector CT imaging of the abdomen and pelvis was performed
following the standard protocol without IV contrast.

[Series 2: axial st · axial · 0.93mm/px · z∈[-498,-88]mm · 12 of 98 slices shown, 14 images]
[im 8/98  soft-tissue]
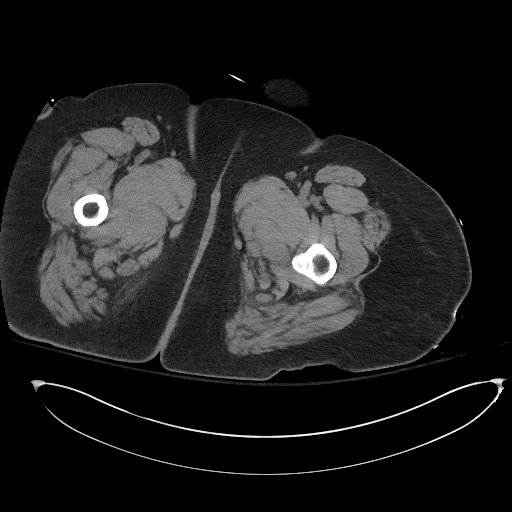
[im 8/98  bone]
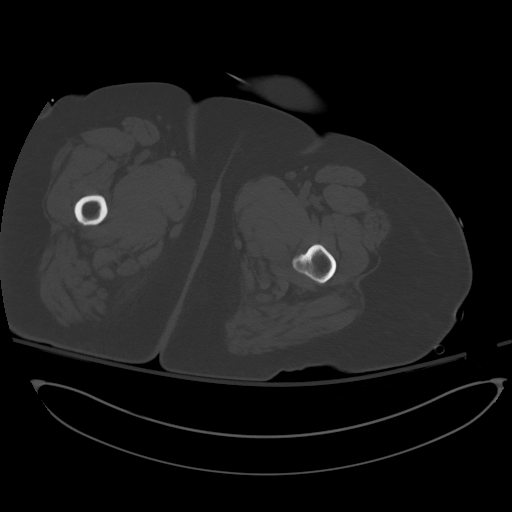
[im 15/98  soft-tissue]
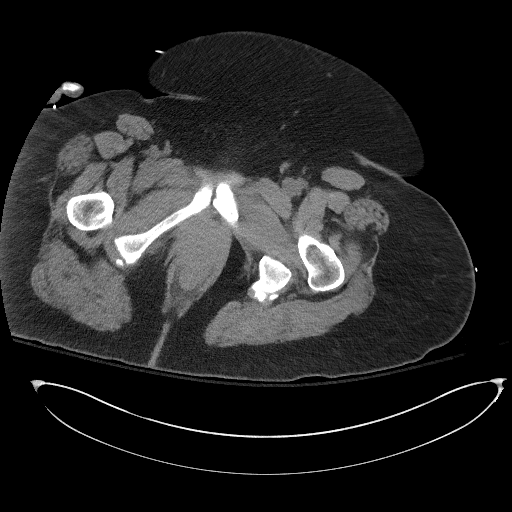
[im 23/98  soft-tissue]
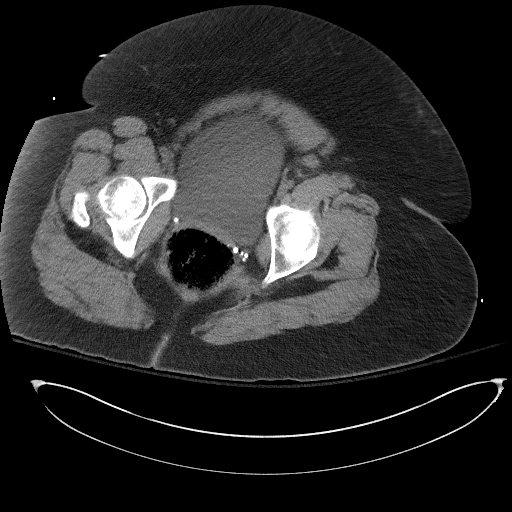
[im 30/98  soft-tissue]
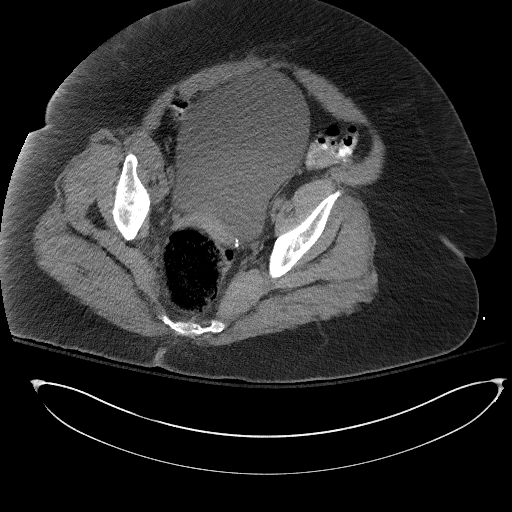
[im 38/98  soft-tissue]
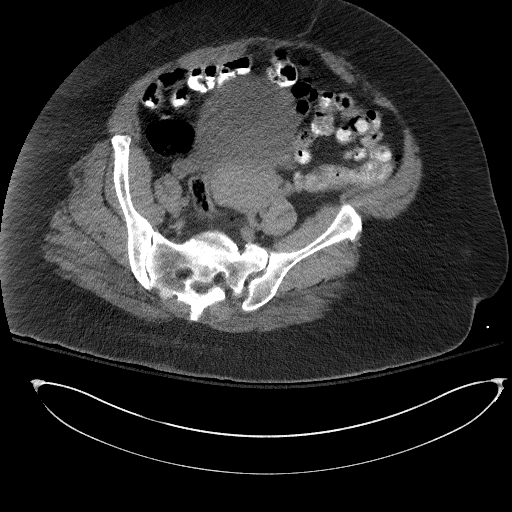
[im 45/98  soft-tissue]
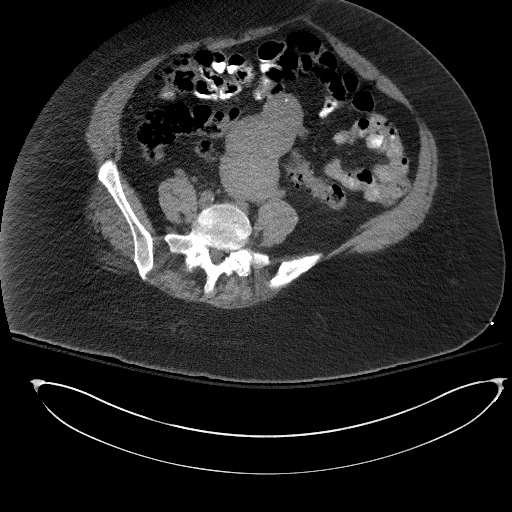
[im 53/98  soft-tissue]
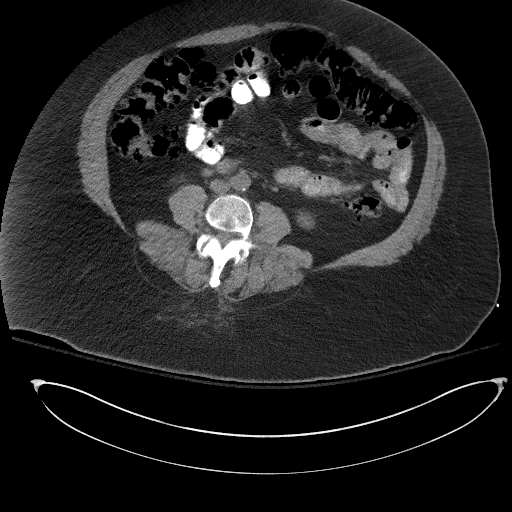
[im 60/98  soft-tissue]
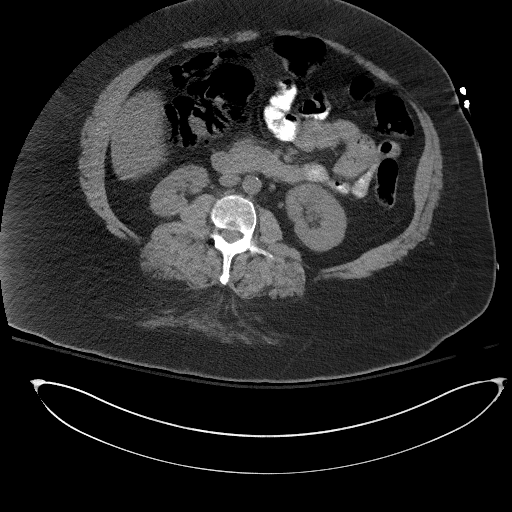
[im 68/98  soft-tissue]
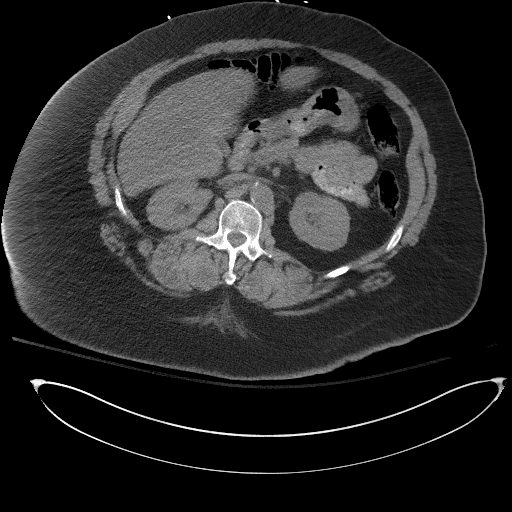
[im 68/98  bone]
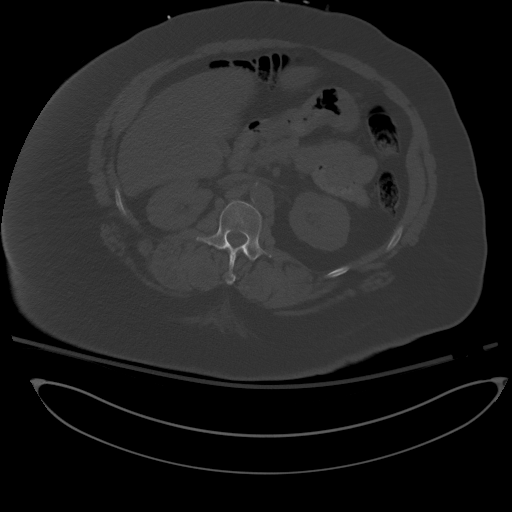
[im 75/98  soft-tissue]
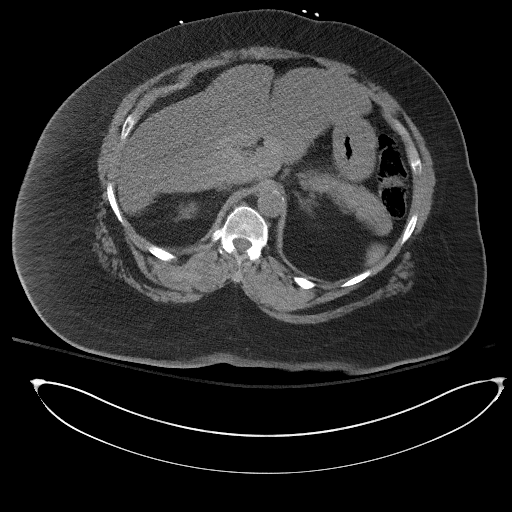
[im 83/98  soft-tissue]
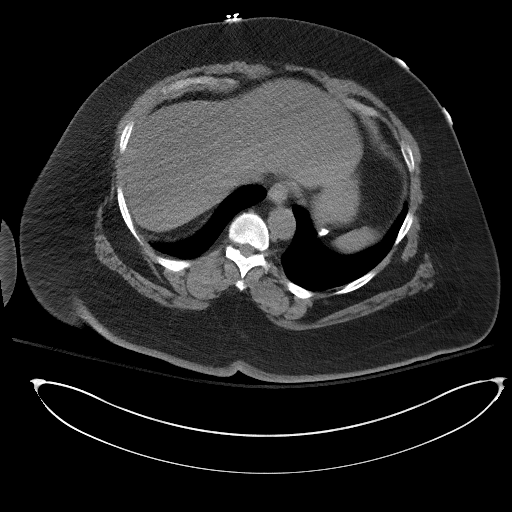
[im 90/98  soft-tissue]
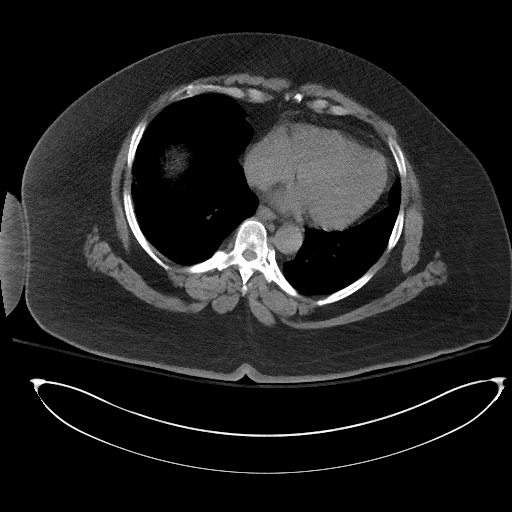

[Series 3: lung bases · axial · 0.93mm/px · 1 of 31 slices shown]
[im 11/31  bone]
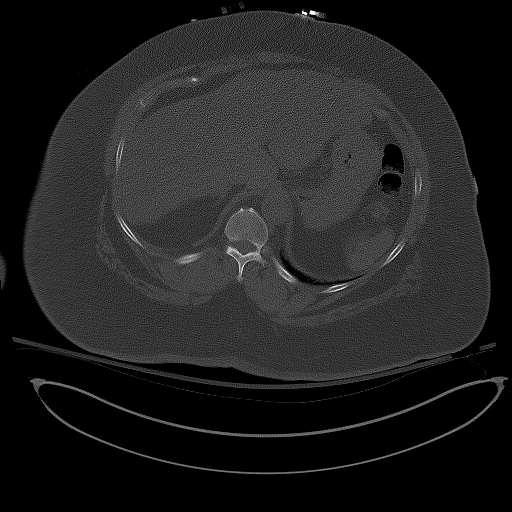

[Series 5: coronal st · coronal · 0.97mm/px · 3 of 127 slices shown]
[im 43/127  soft-tissue]
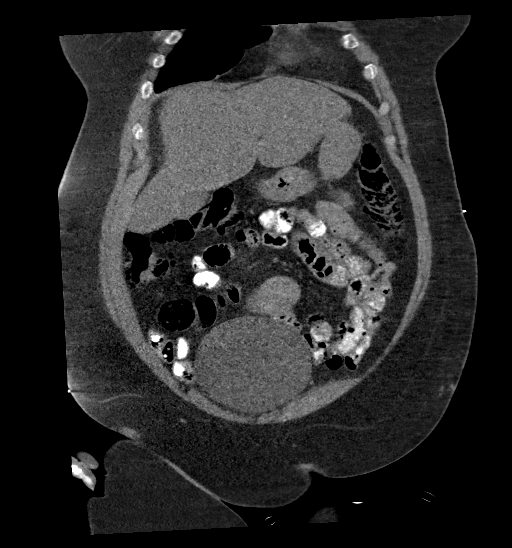
[im 57/127  soft-tissue]
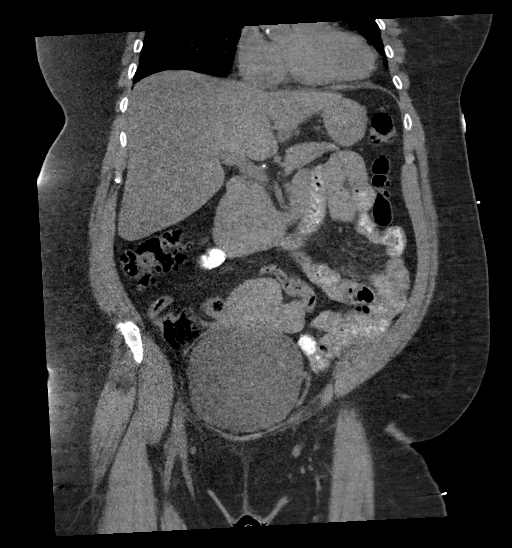
[im 71/127  soft-tissue]
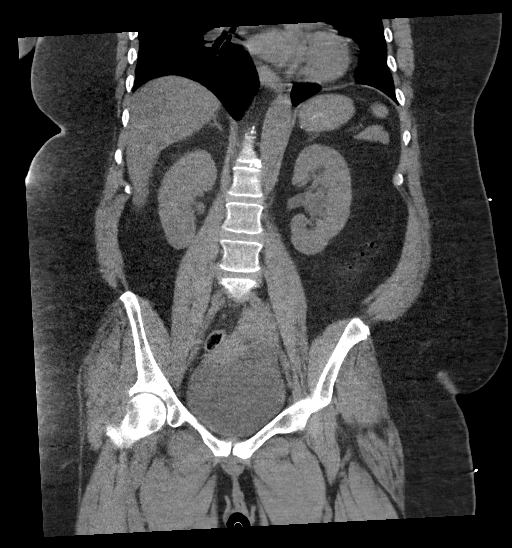

[16 of 46 positions shown; findings below may reference images not displayed]

FINDINGS: Lower chest: No acute findings.

Hepatobiliary: No mass visualized on this unenhanced exam.
Mild-to-moderate diffuse hepatic steatosis noted, with areas of
focal fatty sparing centrally. Gallbladder is unremarkable. No
evidence of biliary ductal dilatation.

Pancreas: No mass or inflammatory process visualized on this
unenhanced exam.

Spleen:  Within normal limits in size.

Adrenals/Urinary tract: No evidence of urolithiasis or
hydronephrosis. 3 cm fluid attenuation cyst again seen in midpole of
left kidney. Urinary bladder is distended, but otherwise
unremarkable in appearance.

Stomach/Bowel: No evidence of obstruction, inflammatory process, or
abnormal fluid collections. Diverticulosis is seen mainly involving
the descending and sigmoid colon, however there is no evidence of
diverticulitis.

Vascular/Lymphatic: No pathologically enlarged lymph nodes
identified. No evidence of abdominal aortic aneurysm. Aortic
atherosclerotic calcification noted.

Reproductive: Multiple fibroids are again seen, largest in the
uterine fundal region measuring approximately 6 cm. Adnexal regions
are unremarkable.

Other:  None.

Musculoskeletal:  No suspicious bone lesions identified.
IMPRESSION: Distended urinary bladder. Recommend clinical correlation for
possible urinary retention.

No evidence of urolithiasis or hydronephrosis.

Stable uterine fibroids.

Colonic diverticulosis, without radiographic evidence of
diverticulitis.

Hepatic steatosis.

## 2022-03-15 NOTE — Telephone Encounter (Signed)
Done

## 2022-03-16 DIAGNOSIS — E1165 Type 2 diabetes mellitus with hyperglycemia: Secondary | ICD-10-CM | POA: Diagnosis not present

## 2022-03-16 DIAGNOSIS — M5416 Radiculopathy, lumbar region: Secondary | ICD-10-CM | POA: Diagnosis not present

## 2022-03-16 DIAGNOSIS — M5126 Other intervertebral disc displacement, lumbar region: Secondary | ICD-10-CM | POA: Diagnosis not present

## 2022-03-25 ENCOUNTER — Other Ambulatory Visit: Payer: Self-pay | Admitting: Nurse Practitioner

## 2022-03-25 DIAGNOSIS — J449 Chronic obstructive pulmonary disease, unspecified: Secondary | ICD-10-CM

## 2022-03-28 ENCOUNTER — Ambulatory Visit: Payer: Medicare Other | Admitting: Nurse Practitioner

## 2022-03-29 DIAGNOSIS — L729 Follicular cyst of the skin and subcutaneous tissue, unspecified: Secondary | ICD-10-CM | POA: Diagnosis not present

## 2022-04-05 ENCOUNTER — Other Ambulatory Visit: Payer: Self-pay | Admitting: Nurse Practitioner

## 2022-04-08 ENCOUNTER — Other Ambulatory Visit: Payer: Self-pay | Admitting: Nurse Practitioner

## 2022-04-08 DIAGNOSIS — E782 Mixed hyperlipidemia: Secondary | ICD-10-CM

## 2022-04-08 DIAGNOSIS — G8929 Other chronic pain: Secondary | ICD-10-CM

## 2022-04-11 DIAGNOSIS — M5136 Other intervertebral disc degeneration, lumbar region: Secondary | ICD-10-CM | POA: Diagnosis not present

## 2022-04-11 DIAGNOSIS — M25551 Pain in right hip: Secondary | ICD-10-CM | POA: Diagnosis not present

## 2022-04-11 DIAGNOSIS — M25552 Pain in left hip: Secondary | ICD-10-CM | POA: Diagnosis not present

## 2022-04-11 DIAGNOSIS — M5416 Radiculopathy, lumbar region: Secondary | ICD-10-CM | POA: Diagnosis not present

## 2022-04-11 DIAGNOSIS — M48062 Spinal stenosis, lumbar region with neurogenic claudication: Secondary | ICD-10-CM | POA: Diagnosis not present

## 2022-04-12 ENCOUNTER — Encounter: Payer: Self-pay | Admitting: Internal Medicine

## 2022-04-12 ENCOUNTER — Ambulatory Visit (INDEPENDENT_AMBULATORY_CARE_PROVIDER_SITE_OTHER): Payer: Medicare Other | Admitting: Internal Medicine

## 2022-04-12 VITALS — BP 138/78 | HR 100 | Temp 98.5°F | Resp 16 | Ht 66.0 in | Wt 250.2 lb

## 2022-04-12 DIAGNOSIS — M5441 Lumbago with sciatica, right side: Secondary | ICD-10-CM

## 2022-04-12 DIAGNOSIS — I10 Essential (primary) hypertension: Secondary | ICD-10-CM | POA: Diagnosis not present

## 2022-04-12 DIAGNOSIS — E1165 Type 2 diabetes mellitus with hyperglycemia: Secondary | ICD-10-CM

## 2022-04-12 DIAGNOSIS — K5909 Other constipation: Secondary | ICD-10-CM | POA: Diagnosis not present

## 2022-04-12 DIAGNOSIS — M5442 Lumbago with sciatica, left side: Secondary | ICD-10-CM | POA: Diagnosis not present

## 2022-04-12 DIAGNOSIS — G8929 Other chronic pain: Secondary | ICD-10-CM | POA: Diagnosis not present

## 2022-04-12 LAB — GLUCOSE, POCT (MANUAL RESULT ENTRY): POC Glucose: 73 mg/dl (ref 70–99)

## 2022-04-12 MED ORDER — TRAMADOL HCL 50 MG PO TABS: 50.0000 mg | ORAL_TABLET | Freq: Three times a day (TID) | ORAL | 0 refills | Status: DC

## 2022-04-12 MED ORDER — LACTULOSE 10 G PO PACK: PACK | ORAL | 0 refills | Status: DC
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
## 2022-04-13 ENCOUNTER — Telehealth: Payer: Self-pay

## 2022-04-13 DIAGNOSIS — G8929 Other chronic pain: Secondary | ICD-10-CM

## 2022-04-13 MED ORDER — ACETAMINOPHEN-CODEINE 300-30 MG PO TABS: 1.0000 | ORAL_TABLET | Freq: Four times a day (QID) | ORAL | 0 refills | Status: DC | PRN

## 2022-04-13 NOTE — Telephone Encounter (Signed)
Med sent.

## 2022-04-14 LAB — MICROALBUMIN / CREATININE URINE RATIO
Creatinine, Urine: 62.4 mg/dL
Microalb/Creat Ratio: 8 mg/g creat (ref 0–29)
Microalbumin, Urine: 4.8 ug/mL

## 2022-04-18 ENCOUNTER — Other Ambulatory Visit: Payer: Self-pay | Admitting: Nurse Practitioner

## 2022-04-28 ENCOUNTER — Encounter: Payer: Self-pay | Admitting: Internal Medicine

## 2022-04-30 ENCOUNTER — Other Ambulatory Visit: Payer: Self-pay | Admitting: Nurse Practitioner

## 2022-04-30 DIAGNOSIS — L732 Hidradenitis suppurativa: Secondary | ICD-10-CM

## 2022-05-11 ENCOUNTER — Other Ambulatory Visit: Payer: Self-pay | Admitting: Internal Medicine

## 2022-05-11 DIAGNOSIS — G8929 Other chronic pain: Secondary | ICD-10-CM

## 2022-05-11 NOTE — Telephone Encounter (Signed)
Last appt 12/23 and 05/25/21

## 2022-05-12 ENCOUNTER — Other Ambulatory Visit: Payer: Self-pay | Admitting: Nurse Practitioner

## 2022-05-12 DIAGNOSIS — G8929 Other chronic pain: Secondary | ICD-10-CM

## 2022-05-12 MED ORDER — ACETAMINOPHEN-CODEINE 300-30 MG PO TABS: 1.0000 | ORAL_TABLET | Freq: Four times a day (QID) | ORAL | 0 refills | Status: DC | PRN

## 2022-05-16 ENCOUNTER — Ambulatory Visit: Payer: 59 | Admitting: Nurse Practitioner

## 2022-05-17 ENCOUNTER — Other Ambulatory Visit: Payer: Self-pay | Admitting: Nurse Practitioner

## 2022-05-17 DIAGNOSIS — R600 Localized edema: Secondary | ICD-10-CM

## 2022-05-18 ENCOUNTER — Telehealth: Payer: Self-pay

## 2022-05-18 ENCOUNTER — Telehealth: Payer: Self-pay | Admitting: Nurse Practitioner

## 2022-05-18 DIAGNOSIS — G8929 Other chronic pain: Secondary | ICD-10-CM

## 2022-05-18 NOTE — Telephone Encounter (Signed)
Left vm and sent mychart message to confirm 05/25/22 appointment-[name]

## 2022-05-19 ENCOUNTER — Telehealth: Payer: Self-pay

## 2022-05-19 MED ORDER — ACETAMINOPHEN-CODEINE 300-30 MG PO TABS: 1.0000 | ORAL_TABLET | Freq: Four times a day (QID) | ORAL | 0 refills | Status: DC | PRN

## 2022-05-19 NOTE — Telephone Encounter (Signed)
[facility] phar called just to clarify that why she is on tramadol and tylenol 3 as per dr Humphrey Rolls advised that due to she cannot take NSAID due to Gi bleeds and she is taking for Rheumatoid arthritis for chronic pain and she alternate med

## 2022-05-19 NOTE — Telephone Encounter (Signed)
Pt advised send med

## 2022-05-20 ENCOUNTER — Telehealth: Payer: Self-pay | Admitting: Internal Medicine

## 2022-05-20 NOTE — Telephone Encounter (Signed)
Most recent A1c results faxed to [facility]; [phone]-[name] 

## 2022-05-25 ENCOUNTER — Encounter: Payer: Self-pay | Admitting: Nurse Practitioner

## 2022-05-25 ENCOUNTER — Ambulatory Visit (INDEPENDENT_AMBULATORY_CARE_PROVIDER_SITE_OTHER): Payer: 59 | Admitting: Nurse Practitioner

## 2022-05-25 VITALS — BP 147/82 | HR 77 | Temp 98.0°F | Resp 16 | Ht 66.0 in | Wt 235.0 lb

## 2022-05-25 DIAGNOSIS — Z23 Encounter for immunization: Secondary | ICD-10-CM | POA: Diagnosis not present

## 2022-05-25 DIAGNOSIS — E782 Mixed hyperlipidemia: Secondary | ICD-10-CM | POA: Diagnosis not present

## 2022-05-25 DIAGNOSIS — E1165 Type 2 diabetes mellitus with hyperglycemia: Secondary | ICD-10-CM

## 2022-05-25 DIAGNOSIS — Z0001 Encounter for general adult medical examination with abnormal findings: Secondary | ICD-10-CM | POA: Diagnosis not present

## 2022-05-25 LAB — POCT GLYCOSYLATED HEMOGLOBIN (HGB A1C): Hemoglobin A1C: 6.4 % — AB (ref 4.0–5.6)

## 2022-05-25 MED ORDER — TETANUS-DIPHTH-ACELL PERTUSSIS 5-2.5-18.5 LF-MCG/0.5 IM SUSP: 0.5000 mL | Freq: Once | INTRAMUSCULAR | 0 refills | Status: AC

## 2022-05-25 MED ORDER — TIRZEPATIDE 7.5 MG/0.5ML ~~LOC~~ SOAJ: 7.5000 mg | SUBCUTANEOUS | 1 refills | Status: DC

## 2022-05-25 MED ORDER — ACCU-CHEK FASTCLIX LANCETS MISC: 3 refills | Status: DC

## 2022-05-25 MED ORDER — ACCU-CHEK GUIDE ME W/DEVICE KIT: 1.0000 | PACK | Freq: Once | 0 refills | Status: AC

## 2022-05-25 MED ORDER — ACCU-CHEK GUIDE VI STRP: ORAL_STRIP | 3 refills | Status: DC
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
## 2022-05-26 ENCOUNTER — Ambulatory Visit: Payer: 59 | Admitting: Nurse Practitioner

## 2022-05-28 LAB — CBC WITH DIFFERENTIAL/PLATELET
Basophils Absolute: 0 10*3/uL (ref 0.0–0.2)
Basos: 0 %
EOS (ABSOLUTE): 0.3 10*3/uL (ref 0.0–0.4)
Eos: 3 %
Hematocrit: 43.6 % (ref 34.0–46.6)
Hemoglobin: 13.9 g/dL (ref 11.1–15.9)
Immature Grans (Abs): 0 10*3/uL (ref 0.0–0.1)
Immature Granulocytes: 0 %
Lymphocytes Absolute: 2.7 10*3/uL (ref 0.7–3.1)
Lymphs: 25 %
MCH: 30.3 pg (ref 26.6–33.0)
MCHC: 31.9 g/dL (ref 31.5–35.7)
MCV: 95 fL (ref 79–97)
Monocytes Absolute: 1 10*3/uL — ABNORMAL HIGH (ref 0.1–0.9)
Monocytes: 9 %
Neutrophils Absolute: 6.8 10*3/uL (ref 1.4–7.0)
Neutrophils: 63 %
Platelets: 366 10*3/uL (ref 150–450)
RBC: 4.59 x10E6/uL (ref 3.77–5.28)
RDW: 13.8 % (ref 11.7–15.4)
WBC: 10.8 10*3/uL (ref 3.4–10.8)

## 2022-05-28 LAB — CMP14+EGFR
ALT: 19 IU/L (ref 0–32)
AST: 14 IU/L (ref 0–40)
Albumin/Globulin Ratio: 1.7 (ref 1.2–2.2)
Albumin: 4.6 g/dL (ref 3.9–4.9)
Alkaline Phosphatase: 217 IU/L — ABNORMAL HIGH (ref 44–121)
BUN/Creatinine Ratio: 23 (ref 12–28)
BUN: 19 mg/dL (ref 8–27)
Bilirubin Total: 0.3 mg/dL (ref 0.0–1.2)
CO2: 24 mmol/L (ref 20–29)
Calcium: 9.8 mg/dL (ref 8.7–10.3)
Chloride: 99 mmol/L (ref 96–106)
Creatinine, Ser: 0.84 mg/dL (ref 0.57–1.00)
Globulin, Total: 2.7 g/dL (ref 1.5–4.5)
Glucose: 87 mg/dL (ref 70–99)
Potassium: 4.1 mmol/L (ref 3.5–5.2)
Sodium: 140 mmol/L (ref 134–144)
Total Protein: 7.3 g/dL (ref 6.0–8.5)
eGFR: 79 mL/min/{1.73_m2} (ref >59)

## 2022-05-28 LAB — LIPID PANEL
Chol/HDL Ratio: 2.3 ratio (ref 0.0–4.4)
Cholesterol, Total: 179 mg/dL (ref 100–199)
HDL: 77 mg/dL (ref >39)
LDL Chol Calc (NIH): 89 mg/dL (ref 0–99)
Triglycerides: 69 mg/dL (ref 0–149)
VLDL Cholesterol Cal: 13 mg/dL (ref 5–40)

## 2022-06-02 ENCOUNTER — Telehealth: Payer: Self-pay

## 2022-06-02 NOTE — Telephone Encounter (Signed)
Patient notified

## 2022-06-02 NOTE — Telephone Encounter (Signed)
-----  Message from Jonetta Osgood, NP sent at 06/02/2022  1:03 PM EST ----- Please let patient know that her cholesterol levels are norma CBC is normal  Potassium level is back up to normal Liver and kidney function is ok.

## 2022-06-02 NOTE — Progress Notes (Signed)
Please let patient know that her cholesterol levels are norma CBC is normal  Potassium level is back up to normal Liver and kidney function is ok.

## 2022-06-17 ENCOUNTER — Telehealth: Payer: Self-pay | Admitting: Nurse Practitioner

## 2022-06-17 NOTE — Telephone Encounter (Signed)
Invoice for MR faxed to [facility] for payment-[name]

## 2022-06-20 ENCOUNTER — Telehealth: Payer: Self-pay

## 2022-06-20 ENCOUNTER — Other Ambulatory Visit: Payer: Self-pay | Admitting: Nurse Practitioner

## 2022-06-20 DIAGNOSIS — J449 Chronic obstructive pulmonary disease, unspecified: Secondary | ICD-10-CM

## 2022-06-20 DIAGNOSIS — G8929 Other chronic pain: Secondary | ICD-10-CM

## 2022-06-20 NOTE — Telephone Encounter (Signed)
Please send

## 2022-06-21 DIAGNOSIS — M48062 Spinal stenosis, lumbar region with neurogenic claudication: Secondary | ICD-10-CM | POA: Diagnosis not present

## 2022-06-21 DIAGNOSIS — M5416 Radiculopathy, lumbar region: Secondary | ICD-10-CM | POA: Diagnosis not present

## 2022-06-22 ENCOUNTER — Other Ambulatory Visit: Payer: Self-pay | Admitting: Nurse Practitioner

## 2022-06-22 DIAGNOSIS — G8929 Other chronic pain: Secondary | ICD-10-CM

## 2022-06-22 MED ORDER — ACETAMINOPHEN-CODEINE 300-30 MG PO TABS: 1.0000 | ORAL_TABLET | Freq: Four times a day (QID) | ORAL | 0 refills | Status: DC | PRN

## 2022-06-22 NOTE — Telephone Encounter (Signed)
Med sent.

## 2022-06-23 ENCOUNTER — Telehealth: Payer: Self-pay | Admitting: Nurse Practitioner

## 2022-06-23 NOTE — Telephone Encounter (Signed)
MR (79 pages)  mailed to: [facility] [address] [place], [place]

## 2022-06-23 NOTE — Telephone Encounter (Signed)
MR emailed to [employer]

## 2022-06-24 NOTE — Telephone Encounter (Signed)
Please  refused already sent

## 2022-06-27 ENCOUNTER — Encounter: Payer: Self-pay | Admitting: Nurse Practitioner

## 2022-06-27 ENCOUNTER — Ambulatory Visit (INDEPENDENT_AMBULATORY_CARE_PROVIDER_SITE_OTHER): Payer: 59 | Admitting: Nurse Practitioner

## 2022-06-27 VITALS — BP 146/80 | HR 88 | Temp 96.7°F | Resp 16 | Ht 66.0 in | Wt 246.8 lb

## 2022-06-27 DIAGNOSIS — E782 Mixed hyperlipidemia: Secondary | ICD-10-CM

## 2022-06-27 DIAGNOSIS — Z23 Encounter for immunization: Secondary | ICD-10-CM

## 2022-06-27 DIAGNOSIS — B351 Tinea unguium: Secondary | ICD-10-CM

## 2022-06-27 DIAGNOSIS — E1165 Type 2 diabetes mellitus with hyperglycemia: Secondary | ICD-10-CM | POA: Diagnosis not present

## 2022-06-27 MED ORDER — TIRZEPATIDE 10 MG/0.5ML ~~LOC~~ SOAJ: 10.0000 mg | SUBCUTANEOUS | 1 refills | Status: DC

## 2022-06-27 MED ORDER — TETANUS-DIPHTH-ACELL PERTUSSIS 5-2.5-18.5 LF-MCG/0.5 IM SUSP: 0.5000 mL | Freq: Once | INTRAMUSCULAR | 0 refills | Status: AC

## 2022-06-27 MED ORDER — ACCU-CHEK FASTCLIX LANCET KIT: 1.0000 | PACK | Freq: Once | 0 refills | Status: AC
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
## 2022-07-03 ENCOUNTER — Other Ambulatory Visit: Payer: Self-pay | Admitting: Internal Medicine

## 2022-07-06 ENCOUNTER — Other Ambulatory Visit: Payer: Self-pay | Admitting: Nurse Practitioner

## 2022-07-07 ENCOUNTER — Other Ambulatory Visit: Payer: Self-pay | Admitting: Internal Medicine

## 2022-07-07 ENCOUNTER — Telehealth: Payer: Self-pay | Admitting: Nurse Practitioner

## 2022-07-07 DIAGNOSIS — K279 Peptic ulcer, site unspecified, unspecified as acute or chronic, without hemorrhage or perforation: Secondary | ICD-10-CM

## 2022-07-07 DIAGNOSIS — E1165 Type 2 diabetes mellitus with hyperglycemia: Secondary | ICD-10-CM

## 2022-07-08 MED ORDER — TIRZEPATIDE 10 MG/0.5ML ~~LOC~~ SOAJ: 10.0000 mg | SUBCUTANEOUS | 1 refills | Status: DC

## 2022-07-08 NOTE — Telephone Encounter (Signed)
Resent mounjaro to a different pharmacy

## 2022-07-09 ENCOUNTER — Other Ambulatory Visit: Payer: Self-pay | Admitting: Nurse Practitioner

## 2022-07-09 DIAGNOSIS — Z7901 Long term (current) use of anticoagulants: Secondary | ICD-10-CM

## 2022-07-15 ENCOUNTER — Ambulatory Visit (INDEPENDENT_AMBULATORY_CARE_PROVIDER_SITE_OTHER): Payer: 59 | Admitting: Podiatry

## 2022-07-15 ENCOUNTER — Ambulatory Visit (INDEPENDENT_AMBULATORY_CARE_PROVIDER_SITE_OTHER): Payer: 59

## 2022-07-15 ENCOUNTER — Encounter: Payer: Self-pay | Admitting: Podiatry

## 2022-07-15 VITALS — BP 151/82 | HR 91

## 2022-07-15 DIAGNOSIS — S93401A Sprain of unspecified ligament of right ankle, initial encounter: Secondary | ICD-10-CM

## 2022-07-15 DIAGNOSIS — M722 Plantar fascial fibromatosis: Secondary | ICD-10-CM | POA: Diagnosis not present

## 2022-07-15 DIAGNOSIS — B351 Tinea unguium: Secondary | ICD-10-CM | POA: Diagnosis not present

## 2022-07-15 DIAGNOSIS — Z79899 Other long term (current) drug therapy: Secondary | ICD-10-CM

## 2022-07-16 LAB — HEPATIC FUNCTION PANEL
ALT: 16 IU/L (ref 0–32)
AST: 11 IU/L (ref 0–40)
Albumin: 4.3 g/dL (ref 3.9–4.9)
Alkaline Phosphatase: 222 IU/L — ABNORMAL HIGH (ref 44–121)
Bilirubin Total: 0.3 mg/dL (ref 0.0–1.2)
Bilirubin, Direct: 0.12 mg/dL (ref 0.00–0.40)
Total Protein: 7.2 g/dL (ref 6.0–8.5)

## 2022-07-18 MED ORDER — TERBINAFINE HCL 250 MG PO TABS: 250.0000 mg | ORAL_TABLET | Freq: Every day | ORAL | 0 refills | Status: DC

## 2022-07-19 ENCOUNTER — Telehealth: Payer: Self-pay | Admitting: Nurse Practitioner

## 2022-07-19 ENCOUNTER — Ambulatory Visit: Payer: 59 | Admitting: Nurse Practitioner

## 2022-07-19 NOTE — Telephone Encounter (Signed)
Spoke to patient she needs a refill on her Tylenol 3 to [facility] and the TraMadol to [facility].

## 2022-07-20 ENCOUNTER — Other Ambulatory Visit: Payer: Self-pay | Admitting: Nurse Practitioner

## 2022-07-20 DIAGNOSIS — G8929 Other chronic pain: Secondary | ICD-10-CM

## 2022-07-20 MED ORDER — TRAMADOL HCL 50 MG PO TABS: 50.0000 mg | ORAL_TABLET | Freq: Three times a day (TID) | ORAL | 0 refills | Status: DC

## 2022-07-20 MED ORDER — ACETAMINOPHEN-CODEINE 300-30 MG PO TABS: 1.0000 | ORAL_TABLET | Freq: Four times a day (QID) | ORAL | 0 refills | Status: DC | PRN

## 2022-07-20 NOTE — Progress Notes (Signed)
Pt advised we send your med
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
## 2022-08-02 ENCOUNTER — Other Ambulatory Visit: Payer: Self-pay | Admitting: Nurse Practitioner

## 2022-08-02 DIAGNOSIS — E1165 Type 2 diabetes mellitus with hyperglycemia: Secondary | ICD-10-CM

## 2022-08-03 ENCOUNTER — Ambulatory Visit (INDEPENDENT_AMBULATORY_CARE_PROVIDER_SITE_OTHER): Payer: 59 | Admitting: Nurse Practitioner

## 2022-08-03 ENCOUNTER — Encounter: Payer: Self-pay | Admitting: Nurse Practitioner

## 2022-08-03 VITALS — BP 138/80 | HR 86 | Temp 97.0°F | Resp 16 | Ht 66.0 in | Wt 257.6 lb

## 2022-08-03 DIAGNOSIS — E1165 Type 2 diabetes mellitus with hyperglycemia: Secondary | ICD-10-CM | POA: Diagnosis not present

## 2022-08-03 DIAGNOSIS — Z1231 Encounter for screening mammogram for malignant neoplasm of breast: Secondary | ICD-10-CM

## 2022-08-03 DIAGNOSIS — I10 Essential (primary) hypertension: Secondary | ICD-10-CM | POA: Diagnosis not present

## 2022-08-03 MED ORDER — LANCING DEVICE MISC
1.0000 | Freq: Once | 0 refills | Status: AC
Start: 2022-08-03 — End: 2022-08-03
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
## 2022-08-05 ENCOUNTER — Other Ambulatory Visit: Payer: Self-pay | Admitting: Nurse Practitioner

## 2022-08-05 DIAGNOSIS — E1165 Type 2 diabetes mellitus with hyperglycemia: Secondary | ICD-10-CM

## 2022-08-08 DIAGNOSIS — M5416 Radiculopathy, lumbar region: Secondary | ICD-10-CM | POA: Diagnosis not present

## 2022-08-08 DIAGNOSIS — M5136 Other intervertebral disc degeneration, lumbar region: Secondary | ICD-10-CM | POA: Diagnosis not present

## 2022-08-08 DIAGNOSIS — M48062 Spinal stenosis, lumbar region with neurogenic claudication: Secondary | ICD-10-CM | POA: Diagnosis not present

## 2022-08-15 ENCOUNTER — Other Ambulatory Visit: Payer: Self-pay | Admitting: Nurse Practitioner

## 2022-08-16 ENCOUNTER — Other Ambulatory Visit: Payer: Self-pay | Admitting: Internal Medicine

## 2022-08-16 DIAGNOSIS — N6019 Diffuse cystic mastopathy of unspecified breast: Secondary | ICD-10-CM

## 2022-08-17 ENCOUNTER — Telehealth: Payer: Self-pay | Admitting: Nurse Practitioner

## 2022-08-17 NOTE — Telephone Encounter (Signed)
Notified patient mammo order has been changed to diagnostic and faxed to [facility]

## 2022-08-19 ENCOUNTER — Ambulatory Visit (INDEPENDENT_AMBULATORY_CARE_PROVIDER_SITE_OTHER): Payer: 59

## 2022-08-19 ENCOUNTER — Ambulatory Visit (INDEPENDENT_AMBULATORY_CARE_PROVIDER_SITE_OTHER): Payer: 59 | Admitting: Podiatry

## 2022-08-19 ENCOUNTER — Other Ambulatory Visit: Payer: Self-pay | Admitting: Nurse Practitioner

## 2022-08-19 ENCOUNTER — Telehealth: Payer: Self-pay | Admitting: Nurse Practitioner

## 2022-08-19 DIAGNOSIS — G8929 Other chronic pain: Secondary | ICD-10-CM

## 2022-08-19 DIAGNOSIS — S93401A Sprain of unspecified ligament of right ankle, initial encounter: Secondary | ICD-10-CM
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
## 2022-08-20 MED ORDER — ACETAMINOPHEN-CODEINE 300-30 MG PO TABS
1.0000 | ORAL_TABLET | Freq: Four times a day (QID) | ORAL | 0 refills | Status: DC | PRN
Start: 2022-08-20 — End: 2022-09-20

## 2022-08-22 NOTE — Telephone Encounter (Signed)
Left patient a message letting her know the medicine was refilled.

## 2022-08-25 ENCOUNTER — Telehealth: Payer: Self-pay | Admitting: Nurse Practitioner

## 2022-08-25 ENCOUNTER — Other Ambulatory Visit: Payer: Self-pay | Admitting: Nurse Practitioner

## 2022-08-25 NOTE — Telephone Encounter (Signed)
Done

## 2022-08-26 MED ORDER — TOPIRAMATE 25 MG PO TABS: 25.0000 mg | ORAL_TABLET | Freq: Every day | ORAL | 1 refills | Status: DC

## 2022-08-29 ENCOUNTER — Other Ambulatory Visit: Payer: Self-pay | Admitting: Nurse Practitioner

## 2022-08-29 DIAGNOSIS — B009 Herpesviral infection, unspecified: Secondary | ICD-10-CM

## 2022-08-31 ENCOUNTER — Other Ambulatory Visit: Payer: Self-pay | Admitting: Nurse Practitioner

## 2022-08-31 DIAGNOSIS — J449 Chronic obstructive pulmonary disease, unspecified: Secondary | ICD-10-CM

## 2022-09-01 ENCOUNTER — Telehealth: Payer: Self-pay | Admitting: *Deleted

## 2022-09-01 DIAGNOSIS — N6012 Diffuse cystic mastopathy of left breast: Secondary | ICD-10-CM | POA: Diagnosis not present

## 2022-09-01 DIAGNOSIS — N6011 Diffuse cystic mastopathy of right breast: Secondary | ICD-10-CM | POA: Diagnosis not present

## 2022-09-01 MED ORDER — METHYLPREDNISOLONE 4 MG PO TBPK
ORAL_TABLET | ORAL | 0 refills | Status: DC
Start: 2022-09-01 — End: 2022-11-30

## 2022-09-01 MED ORDER — MELOXICAM 15 MG PO TABS: 15.0000 mg | ORAL_TABLET | Freq: Every day | ORAL | 0 refills | Status: DC

## 2022-09-01 NOTE — Telephone Encounter (Signed)
Patient is calling her ankle is swelling,is in a smaller boot now what should she do at this point besides elevation and icing? Please advise.

## 2022-09-01 NOTE — Telephone Encounter (Signed)
Patient updated thru voice message. 

## 2022-09-02 ENCOUNTER — Telehealth: Payer: Self-pay | Admitting: *Deleted

## 2022-09-02 NOTE — Telephone Encounter (Signed)
Patient is calling for status of her prescriptions that were supposed to be sent to pharmacy,explained that they were sent to [facility], verbalized understanding and will try to get someone to take her to pick up since she does not drive, if she can't find anyone to take her will call back and have it resent to the [facility]).

## 2022-09-09 ENCOUNTER — Other Ambulatory Visit: Payer: Self-pay | Admitting: Nurse Practitioner

## 2022-09-13 ENCOUNTER — Telehealth: Payer: Self-pay | Admitting: Nurse Practitioner

## 2022-09-13 DIAGNOSIS — Z7901 Long term (current) use of anticoagulants: Secondary | ICD-10-CM

## 2022-09-13 DIAGNOSIS — E1165 Type 2 diabetes mellitus with hyperglycemia: Secondary | ICD-10-CM

## 2022-09-14 MED ORDER — BUPROPION HCL ER (XL) 300 MG PO TB24
300.0000 mg | ORAL_TABLET | Freq: Every day | ORAL | 3 refills | Status: DC
Start: 2022-09-14 — End: 2022-11-30

## 2022-09-14 MED ORDER — DAPAGLIFLOZIN PROPANEDIOL 10 MG PO TABS
ORAL_TABLET | ORAL | 3 refills | Status: DC
Start: 2022-09-14 — End: 2022-11-22

## 2022-09-14 MED ORDER — APIXABAN 5 MG PO TABS
5.0000 mg | ORAL_TABLET | Freq: Two times a day (BID) | ORAL | 2 refills | Status: DC
Start: 2022-09-14 — End: 2022-11-30

## 2022-09-14 NOTE — Telephone Encounter (Signed)
Left patient a message letting her know her medicine were send for 30 days.

## 2022-09-19 ENCOUNTER — Other Ambulatory Visit: Payer: Self-pay | Admitting: Nurse Practitioner

## 2022-09-19 DIAGNOSIS — G8929 Other chronic pain: Secondary | ICD-10-CM

## 2022-09-19 NOTE — Telephone Encounter (Signed)
Next 10/26/22

## 2022-09-22 ENCOUNTER — Other Ambulatory Visit: Payer: Self-pay

## 2022-09-22 DIAGNOSIS — E1165 Type 2 diabetes mellitus with hyperglycemia: Secondary | ICD-10-CM

## 2022-09-22 MED ORDER — TIRZEPATIDE 10 MG/0.5ML ~~LOC~~ SOAJ
10.0000 mg | SUBCUTANEOUS | 1 refills | Status: DC
Start: 2022-09-22 — End: 2022-10-26

## 2022-09-26 ENCOUNTER — Other Ambulatory Visit: Payer: Self-pay | Admitting: Internal Medicine

## 2022-09-26 DIAGNOSIS — E782 Mixed hyperlipidemia: Secondary | ICD-10-CM

## 2022-09-28 ENCOUNTER — Other Ambulatory Visit: Payer: Self-pay | Admitting: Internal Medicine

## 2022-10-03 ENCOUNTER — Other Ambulatory Visit: Payer: Self-pay | Admitting: Internal Medicine

## 2022-10-03 DIAGNOSIS — G8929 Other chronic pain: Secondary | ICD-10-CM

## 2022-10-14 ENCOUNTER — Other Ambulatory Visit: Payer: Self-pay | Admitting: Nurse Practitioner

## 2022-10-14 DIAGNOSIS — I10 Essential (primary) hypertension: Secondary | ICD-10-CM

## 2022-10-16 ENCOUNTER — Other Ambulatory Visit: Payer: Self-pay | Admitting: Nurse Practitioner

## 2022-10-19 ENCOUNTER — Telehealth: Payer: Self-pay | Admitting: Nurse Practitioner

## 2022-10-19 DIAGNOSIS — G8929 Other chronic pain: Secondary | ICD-10-CM

## 2022-10-19 MED ORDER — TRAMADOL HCL 50 MG PO TABS
ORAL_TABLET | ORAL | 0 refills | Status: DC
Start: 2022-10-19 — End: 2022-10-26

## 2022-10-19 MED ORDER — ACETAMINOPHEN-CODEINE 300-30 MG PO TABS
1.0000 | ORAL_TABLET | Freq: Four times a day (QID) | ORAL | 0 refills | Status: DC | PRN
Start: 2022-10-19 — End: 2022-10-26

## 2022-10-19 NOTE — Telephone Encounter (Signed)
refills  

## 2022-10-19 NOTE — Telephone Encounter (Signed)
Patient notified

## 2022-10-26 ENCOUNTER — Encounter: Payer: Self-pay | Admitting: Nurse Practitioner

## 2022-10-26 ENCOUNTER — Ambulatory Visit (INDEPENDENT_AMBULATORY_CARE_PROVIDER_SITE_OTHER): Payer: 59 | Admitting: Nurse Practitioner

## 2022-10-26 VITALS — BP 132/86 | HR 88 | Temp 98.3°F | Resp 16 | Ht 66.0 in | Wt 261.4 lb

## 2022-10-26 DIAGNOSIS — M5442 Lumbago with sciatica, left side: Secondary | ICD-10-CM

## 2022-10-26 DIAGNOSIS — E1165 Type 2 diabetes mellitus with hyperglycemia: Secondary | ICD-10-CM

## 2022-10-26 DIAGNOSIS — Z23 Encounter for immunization: Secondary | ICD-10-CM | POA: Diagnosis not present

## 2022-10-26 DIAGNOSIS — M5441 Lumbago with sciatica, right side: Secondary | ICD-10-CM | POA: Diagnosis not present

## 2022-10-26 DIAGNOSIS — G8929 Other chronic pain: Secondary | ICD-10-CM | POA: Diagnosis not present

## 2022-10-26 DIAGNOSIS — J449 Chronic obstructive pulmonary disease, unspecified: Secondary | ICD-10-CM | POA: Diagnosis not present

## 2022-10-26 LAB — POCT GLYCOSYLATED HEMOGLOBIN (HGB A1C): Hemoglobin A1C: 9.2 % — AB (ref 4.0–5.6)

## 2022-10-26 MED ORDER — TRELEGY ELLIPTA 100-62.5-25 MCG/ACT IN AEPB
1.0000 | INHALATION_SPRAY | Freq: Every day | RESPIRATORY_TRACT | 3 refills | Status: DC
Start: 2022-10-26 — End: 2023-05-30

## 2022-10-26 MED ORDER — TRAMADOL HCL 50 MG PO TABS
ORAL_TABLET | ORAL | 1 refills | Status: DC
Start: 2022-10-26 — End: 2022-11-22

## 2022-10-26 MED ORDER — TIRZEPATIDE 12.5 MG/0.5ML ~~LOC~~ SOAJ
12.5000 mg | SUBCUTANEOUS | 1 refills | Status: DC
Start: 2022-10-26 — End: 2023-01-13

## 2022-10-26 MED ORDER — ACETAMINOPHEN-CODEINE 300-30 MG PO TABS
1.0000 | ORAL_TABLET | Freq: Four times a day (QID) | ORAL | 2 refills | Status: DC | PRN
Start: 2022-10-26 — End: 2022-11-22

## 2022-10-26 MED ORDER — TETANUS-DIPHTH-ACELL PERTUSSIS 5-2.5-18.5 LF-MCG/0.5 IM SUSP
0.5000 mL | Freq: Once | INTRAMUSCULAR | 0 refills | Status: AC
Start: 2022-10-26 — End: 2022-10-26

## 2022-10-26 MED ORDER — DEXCOM G7 SENSOR MISC: 3 refills | Status: DC
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
## 2022-10-31 ENCOUNTER — Telehealth: Payer: Self-pay | Admitting: Nurse Practitioner

## 2022-10-31 MED ORDER — TIRZEPATIDE 10 MG/0.5ML ~~LOC~~ SOAJ: 10.0000 mg | SUBCUTANEOUS | 2 refills | Status: DC

## 2022-10-31 NOTE — Telephone Encounter (Signed)
Pt was notified.  

## 2022-11-06 ENCOUNTER — Other Ambulatory Visit: Payer: Self-pay | Admitting: Internal Medicine

## 2022-11-06 DIAGNOSIS — L732 Hidradenitis suppurativa: Secondary | ICD-10-CM

## 2022-11-06 DIAGNOSIS — J449 Chronic obstructive pulmonary disease, unspecified: Secondary | ICD-10-CM

## 2022-11-18 ENCOUNTER — Other Ambulatory Visit: Payer: Self-pay

## 2022-11-18 ENCOUNTER — Other Ambulatory Visit: Payer: Self-pay | Admitting: Nurse Practitioner

## 2022-11-18 ENCOUNTER — Other Ambulatory Visit: Payer: Self-pay | Admitting: Podiatry

## 2022-11-18 DIAGNOSIS — E1165 Type 2 diabetes mellitus with hyperglycemia: Secondary | ICD-10-CM

## 2022-11-18 DIAGNOSIS — R600 Localized edema: Secondary | ICD-10-CM

## 2022-11-18 MED ORDER — FUROSEMIDE 20 MG PO TABS
20.0000 mg | ORAL_TABLET | Freq: Every day | ORAL | 1 refills | Status: DC
Start: 2022-11-18 — End: 2023-01-13

## 2022-11-18 NOTE — Telephone Encounter (Signed)
Pt needs an appointment

## 2022-11-21 ENCOUNTER — Telehealth: Payer: Self-pay | Admitting: Nurse Practitioner

## 2022-11-22 ENCOUNTER — Other Ambulatory Visit: Payer: Self-pay | Admitting: Nurse Practitioner

## 2022-11-22 ENCOUNTER — Telehealth: Payer: Self-pay | Admitting: Nurse Practitioner

## 2022-11-22 ENCOUNTER — Other Ambulatory Visit: Payer: Self-pay | Admitting: Physician Assistant

## 2022-11-22 DIAGNOSIS — G8929 Other chronic pain: Secondary | ICD-10-CM

## 2022-11-22 DIAGNOSIS — E1165 Type 2 diabetes mellitus with hyperglycemia: Secondary | ICD-10-CM

## 2022-11-22 MED ORDER — TRAMADOL HCL 50 MG PO TABS
ORAL_TABLET | ORAL | 0 refills | Status: DC
Start: 2022-11-22 — End: 2022-11-30

## 2022-11-22 MED ORDER — ACETAMINOPHEN-CODEINE 300-30 MG PO TABS
1.0000 | ORAL_TABLET | Freq: Four times a day (QID) | ORAL | 0 refills | Status: DC | PRN
Start: 2022-11-22 — End: 2022-11-30

## 2022-11-22 NOTE — Telephone Encounter (Signed)
Pt was notified to call her pharmacy to get her refills.

## 2022-11-22 NOTE — Telephone Encounter (Signed)
Brenda Santana sent in her medications for her.

## 2022-11-24 ENCOUNTER — Other Ambulatory Visit: Payer: Self-pay | Admitting: Nurse Practitioner

## 2022-11-24 DIAGNOSIS — B009 Herpesviral infection, unspecified: Secondary | ICD-10-CM

## 2022-11-30 ENCOUNTER — Ambulatory Visit: Payer: 59 | Admitting: Nurse Practitioner

## 2022-11-30 ENCOUNTER — Encounter: Payer: Self-pay | Admitting: Nurse Practitioner

## 2022-11-30 VITALS — BP 138/88 | Temp 98.1°F | Resp 16 | Ht 66.0 in | Wt 259.0 lb

## 2022-11-30 DIAGNOSIS — J449 Chronic obstructive pulmonary disease, unspecified: Secondary | ICD-10-CM | POA: Diagnosis not present

## 2022-11-30 DIAGNOSIS — M5442 Lumbago with sciatica, left side: Secondary | ICD-10-CM | POA: Diagnosis not present

## 2022-11-30 DIAGNOSIS — E1165 Type 2 diabetes mellitus with hyperglycemia: Secondary | ICD-10-CM

## 2022-11-30 DIAGNOSIS — M5441 Lumbago with sciatica, right side: Secondary | ICD-10-CM | POA: Diagnosis not present

## 2022-11-30 DIAGNOSIS — Z87891 Personal history of nicotine dependence: Secondary | ICD-10-CM

## 2022-11-30 DIAGNOSIS — I10 Essential (primary) hypertension: Secondary | ICD-10-CM | POA: Diagnosis not present

## 2022-11-30 DIAGNOSIS — Z86718 Personal history of other venous thrombosis and embolism: Secondary | ICD-10-CM | POA: Diagnosis not present

## 2022-11-30 DIAGNOSIS — Z79899 Other long term (current) drug therapy: Secondary | ICD-10-CM

## 2022-11-30 DIAGNOSIS — G8929 Other chronic pain: Secondary | ICD-10-CM | POA: Diagnosis not present

## 2022-11-30 DIAGNOSIS — E782 Mixed hyperlipidemia: Secondary | ICD-10-CM

## 2022-11-30 DIAGNOSIS — J4489 Other specified chronic obstructive pulmonary disease: Secondary | ICD-10-CM

## 2022-11-30 DIAGNOSIS — K279 Peptic ulcer, site unspecified, unspecified as acute or chronic, without hemorrhage or perforation: Secondary | ICD-10-CM

## 2022-11-30 DIAGNOSIS — Z7901 Long term (current) use of anticoagulants: Secondary | ICD-10-CM

## 2022-11-30 MED ORDER — ATORVASTATIN CALCIUM 10 MG PO TABS
10.0000 mg | ORAL_TABLET | Freq: Every day | ORAL | 1 refills | Status: DC
Start: 2022-11-30 — End: 2023-01-12

## 2022-11-30 MED ORDER — TIRZEPATIDE 10 MG/0.5ML ~~LOC~~ SOAJ
10.0000 mg | SUBCUTANEOUS | 2 refills | Status: DC
Start: 2022-11-30 — End: 2023-01-13

## 2022-11-30 MED ORDER — PANTOPRAZOLE SODIUM 40 MG PO TBEC
40.0000 mg | DELAYED_RELEASE_TABLET | Freq: Two times a day (BID) | ORAL | 1 refills | Status: DC
Start: 2022-11-30 — End: 2023-01-05

## 2022-11-30 MED ORDER — LINACLOTIDE 145 MCG PO CAPS
ORAL_CAPSULE | ORAL | 3 refills | Status: DC
Start: 2022-11-30 — End: 2023-01-12

## 2022-11-30 MED ORDER — ACCU-CHEK FASTCLIX LANCET KIT
1.0000 | PACK | Freq: Once | 0 refills | Status: AC
Start: 2022-11-30 — End: 2022-11-30

## 2022-11-30 MED ORDER — BUPROPION HCL ER (XL) 300 MG PO TB24
300.0000 mg | ORAL_TABLET | Freq: Every day | ORAL | 3 refills | Status: DC
Start: 2022-11-30 — End: 2023-05-30

## 2022-11-30 MED ORDER — GLIMEPIRIDE 2 MG PO TABS
ORAL_TABLET | ORAL | 1 refills | Status: DC
Start: 2022-11-30 — End: 2023-01-13

## 2022-11-30 MED ORDER — APIXABAN 5 MG PO TABS: 5.0000 mg | ORAL_TABLET | Freq: Two times a day (BID) | ORAL | 3 refills | Status: DC

## 2022-11-30 MED ORDER — IPRATROPIUM-ALBUTEROL 0.5-2.5 (3) MG/3ML IN SOLN
3.0000 mL | Freq: Four times a day (QID) | RESPIRATORY_TRACT | 3 refills | Status: DC | PRN
Start: 2022-11-30 — End: 2023-05-30

## 2022-11-30 MED ORDER — ALBUTEROL SULFATE HFA 108 (90 BASE) MCG/ACT IN AERS
1.0000 | INHALATION_SPRAY | Freq: Four times a day (QID) | RESPIRATORY_TRACT | 5 refills | Status: DC | PRN
Start: 2022-11-30 — End: 2023-01-13

## 2022-11-30 MED ORDER — ACETAMINOPHEN-CODEINE 300-30 MG PO TABS
1.0000 | ORAL_TABLET | Freq: Four times a day (QID) | ORAL | 2 refills | Status: DC | PRN
Start: 2022-11-30 — End: 2023-02-22

## 2022-11-30 MED ORDER — LISINOPRIL 10 MG PO TABS
10.0000 mg | ORAL_TABLET | Freq: Every day | ORAL | 3 refills | Status: DC
Start: 2022-11-30 — End: 2022-12-28

## 2022-11-30 MED ORDER — TRAMADOL HCL 50 MG PO TABS
ORAL_TABLET | ORAL | 2 refills | Status: DC
Start: 2022-11-30 — End: 2023-02-22
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
## 2022-12-10 ENCOUNTER — Encounter: Payer: Self-pay | Admitting: Nurse Practitioner

## 2022-12-16 ENCOUNTER — Other Ambulatory Visit: Payer: Self-pay | Admitting: Nurse Practitioner

## 2022-12-16 DIAGNOSIS — Z86718 Personal history of other venous thrombosis and embolism: Secondary | ICD-10-CM

## 2022-12-20 ENCOUNTER — Ambulatory Visit (INDEPENDENT_AMBULATORY_CARE_PROVIDER_SITE_OTHER): Payer: 59 | Admitting: Podiatry

## 2022-12-20 DIAGNOSIS — Z79899 Other long term (current) drug therapy: Secondary | ICD-10-CM

## 2022-12-20 DIAGNOSIS — B351 Tinea unguium: Secondary | ICD-10-CM
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
## 2022-12-21 LAB — HEPATIC FUNCTION PANEL
ALT: 20 IU/L (ref 0–32)
AST: 17 IU/L (ref 0–40)
Albumin: 4.3 g/dL (ref 3.9–4.9)
Alkaline Phosphatase: 163 IU/L — ABNORMAL HIGH (ref 44–121)
Bilirubin Total: 0.2 mg/dL (ref 0.0–1.2)
Bilirubin, Direct: 0.11 mg/dL (ref 0.00–0.40)
Total Protein: 6.8 g/dL (ref 6.0–8.5)

## 2022-12-21 MED ORDER — TERBINAFINE HCL 250 MG PO TABS: 250.0000 mg | ORAL_TABLET | Freq: Every day | ORAL | 0 refills | Status: DC

## 2022-12-21 NOTE — Addendum Note (Signed)
Addended by: Nicholes Rough on: 12/21/2022 08:02 AM   Modules accepted: Orders

## 2022-12-28 ENCOUNTER — Other Ambulatory Visit: Payer: Self-pay | Admitting: Nurse Practitioner

## 2022-12-28 DIAGNOSIS — I10 Essential (primary) hypertension: Secondary | ICD-10-CM

## 2023-01-05 ENCOUNTER — Other Ambulatory Visit: Payer: Self-pay | Admitting: Nurse Practitioner

## 2023-01-05 DIAGNOSIS — G8929 Other chronic pain: Secondary | ICD-10-CM

## 2023-01-05 DIAGNOSIS — Z79899 Other long term (current) drug therapy: Secondary | ICD-10-CM

## 2023-01-12 ENCOUNTER — Other Ambulatory Visit: Payer: Self-pay | Admitting: Nurse Practitioner

## 2023-01-12 DIAGNOSIS — Z79899 Other long term (current) drug therapy: Secondary | ICD-10-CM

## 2023-01-13 ENCOUNTER — Other Ambulatory Visit: Payer: Self-pay

## 2023-01-13 DIAGNOSIS — R600 Localized edema: Secondary | ICD-10-CM

## 2023-01-13 DIAGNOSIS — E1165 Type 2 diabetes mellitus with hyperglycemia: Secondary | ICD-10-CM

## 2023-01-13 DIAGNOSIS — G8929 Other chronic pain: Secondary | ICD-10-CM

## 2023-01-13 DIAGNOSIS — J449 Chronic obstructive pulmonary disease, unspecified: Secondary | ICD-10-CM

## 2023-01-13 MED ORDER — FUROSEMIDE 20 MG PO TABS
20.0000 mg | ORAL_TABLET | Freq: Every day | ORAL | 1 refills | Status: DC
Start: 2023-01-13 — End: 2023-05-15

## 2023-01-13 MED ORDER — GLIMEPIRIDE 2 MG PO TABS
ORAL_TABLET | ORAL | 1 refills | Status: DC
Start: 2023-01-13 — End: 2023-05-30

## 2023-01-13 MED ORDER — TIRZEPATIDE 12.5 MG/0.5ML ~~LOC~~ SOAJ
12.5000 mg | SUBCUTANEOUS | 1 refills | Status: DC
Start: 2023-01-13 — End: 2023-02-06

## 2023-01-13 MED ORDER — GABAPENTIN 800 MG PO TABS
800.0000 mg | ORAL_TABLET | Freq: Three times a day (TID) | ORAL | 1 refills | Status: DC
Start: 2023-01-13 — End: 2023-02-22

## 2023-01-13 MED ORDER — MONTELUKAST SODIUM 10 MG PO TABS: 10.0000 mg | ORAL_TABLET | Freq: Every day | ORAL | 1 refills | Status: DC

## 2023-01-13 MED ORDER — ALBUTEROL SULFATE HFA 108 (90 BASE) MCG/ACT IN AERS: 1.0000 | INHALATION_SPRAY | Freq: Four times a day (QID) | RESPIRATORY_TRACT | 5 refills | Status: DC | PRN

## 2023-01-13 NOTE — Telephone Encounter (Signed)
Pt spoke with Brenda Santana that she loss medication when she moving sent med

## 2023-01-17 ENCOUNTER — Telehealth: Payer: Self-pay | Admitting: Nurse Practitioner

## 2023-01-23 ENCOUNTER — Telehealth: Payer: Self-pay | Admitting: Nurse Practitioner

## 2023-01-23 NOTE — Telephone Encounter (Signed)
05/25/22 to present MR and payment receipt emailed to [place] w/ [facility]; [email or web address]-[name]

## 2023-01-24 NOTE — Telephone Encounter (Signed)
Patient called back and was notified about her refill.

## 2023-01-24 NOTE — Telephone Encounter (Signed)
Left patient a message letting her know she should have 1 more refill at pharmacy.

## 2023-02-01 ENCOUNTER — Encounter: Payer: Self-pay | Admitting: Nurse Practitioner

## 2023-02-01 ENCOUNTER — Ambulatory Visit (INDEPENDENT_AMBULATORY_CARE_PROVIDER_SITE_OTHER): Payer: 59 | Admitting: Nurse Practitioner

## 2023-02-01 VITALS — BP 138/88 | HR 76 | Temp 98.0°F | Resp 16 | Ht 66.0 in | Wt 259.3 lb

## 2023-02-01 DIAGNOSIS — B379 Candidiasis, unspecified: Secondary | ICD-10-CM | POA: Diagnosis not present

## 2023-02-01 DIAGNOSIS — L0201 Cutaneous abscess of face: Secondary | ICD-10-CM | POA: Diagnosis not present

## 2023-02-01 MED ORDER — SULFAMETHOXAZOLE-TRIMETHOPRIM 800-160 MG PO TABS
1.0000 | ORAL_TABLET | Freq: Two times a day (BID) | ORAL | 0 refills | Status: AC
Start: 2023-02-01 — End: 2023-02-15

## 2023-02-01 MED ORDER — FLUCONAZOLE 150 MG PO TABS
150.0000 mg | ORAL_TABLET | Freq: Once | ORAL | 0 refills | Status: AC
Start: 2023-02-01 — End: 2023-02-01
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
## 2023-02-06 ENCOUNTER — Telehealth: Payer: Self-pay

## 2023-02-06 ENCOUNTER — Other Ambulatory Visit: Payer: Self-pay

## 2023-02-06 DIAGNOSIS — E1165 Type 2 diabetes mellitus with hyperglycemia: Secondary | ICD-10-CM

## 2023-02-06 MED ORDER — TIRZEPATIDE 12.5 MG/0.5ML ~~LOC~~ SOAJ: 12.5000 mg | SUBCUTANEOUS | 1 refills | Status: DC

## 2023-02-06 NOTE — Telephone Encounter (Signed)
Lmom that we sent [facility]

## 2023-02-06 NOTE — Telephone Encounter (Signed)
Patient called said that her abscess is getting bigger and Alyssa told her to call back so she did, Spoke to [place] and she advise to go to the ER. Patient was advise that she needs to go tot he ER so they can drain it before it gets close to her eye.

## 2023-02-07 DIAGNOSIS — Z7982 Long term (current) use of aspirin: Secondary | ICD-10-CM | POA: Diagnosis not present

## 2023-02-07 DIAGNOSIS — I509 Heart failure, unspecified: Secondary | ICD-10-CM | POA: Diagnosis not present

## 2023-02-07 DIAGNOSIS — Z79899 Other long term (current) drug therapy: Secondary | ICD-10-CM | POA: Diagnosis not present

## 2023-02-07 DIAGNOSIS — Z86718 Personal history of other venous thrombosis and embolism: Secondary | ICD-10-CM | POA: Diagnosis not present

## 2023-02-07 DIAGNOSIS — Z7902 Long term (current) use of antithrombotics/antiplatelets: Secondary | ICD-10-CM | POA: Diagnosis not present

## 2023-02-07 DIAGNOSIS — J340 Abscess, furuncle and carbuncle of nose: Secondary | ICD-10-CM | POA: Diagnosis not present

## 2023-02-07 DIAGNOSIS — E119 Type 2 diabetes mellitus without complications: Secondary | ICD-10-CM | POA: Diagnosis not present

## 2023-02-07 DIAGNOSIS — Z87891 Personal history of nicotine dependence: Secondary | ICD-10-CM | POA: Diagnosis not present

## 2023-02-07 DIAGNOSIS — G8929 Other chronic pain: Secondary | ICD-10-CM | POA: Diagnosis not present

## 2023-02-07 DIAGNOSIS — I110 Hypertensive heart disease with heart failure: Secondary | ICD-10-CM | POA: Diagnosis not present

## 2023-02-07 DIAGNOSIS — M549 Dorsalgia, unspecified: Secondary | ICD-10-CM | POA: Diagnosis not present

## 2023-02-07 DIAGNOSIS — L0291 Cutaneous abscess, unspecified: Secondary | ICD-10-CM | POA: Diagnosis not present

## 2023-02-07 DIAGNOSIS — E785 Hyperlipidemia, unspecified: Secondary | ICD-10-CM | POA: Diagnosis not present

## 2023-02-07 DIAGNOSIS — J449 Chronic obstructive pulmonary disease, unspecified: Secondary | ICD-10-CM | POA: Diagnosis not present

## 2023-02-09 ENCOUNTER — Telehealth: Payer: Self-pay | Admitting: Nurse Practitioner

## 2023-02-09 NOTE — Telephone Encounter (Signed)
Lvm to schedule ED follow up-[name] 

## 2023-02-18 ENCOUNTER — Other Ambulatory Visit: Payer: Self-pay | Admitting: Nurse Practitioner

## 2023-02-18 DIAGNOSIS — B009 Herpesviral infection, unspecified: Secondary | ICD-10-CM

## 2023-02-22 ENCOUNTER — Ambulatory Visit (INDEPENDENT_AMBULATORY_CARE_PROVIDER_SITE_OTHER): Payer: 59 | Admitting: Nurse Practitioner

## 2023-02-22 ENCOUNTER — Encounter: Payer: Self-pay | Admitting: Nurse Practitioner

## 2023-02-22 VITALS — BP 130/84 | HR 79 | Temp 98.2°F | Resp 16 | Ht 66.0 in | Wt 257.6 lb

## 2023-02-22 DIAGNOSIS — N950 Postmenopausal bleeding: Secondary | ICD-10-CM | POA: Diagnosis not present

## 2023-02-22 DIAGNOSIS — G8929 Other chronic pain: Secondary | ICD-10-CM

## 2023-02-22 DIAGNOSIS — Z79899 Other long term (current) drug therapy: Secondary | ICD-10-CM | POA: Diagnosis not present

## 2023-02-22 DIAGNOSIS — E1169 Type 2 diabetes mellitus with other specified complication: Secondary | ICD-10-CM | POA: Diagnosis not present

## 2023-02-22 DIAGNOSIS — M5441 Lumbago with sciatica, right side: Secondary | ICD-10-CM

## 2023-02-22 DIAGNOSIS — E785 Hyperlipidemia, unspecified: Secondary | ICD-10-CM

## 2023-02-22 DIAGNOSIS — M5442 Lumbago with sciatica, left side: Secondary | ICD-10-CM | POA: Diagnosis not present

## 2023-02-22 LAB — POCT GLYCOSYLATED HEMOGLOBIN (HGB A1C): Hemoglobin A1C: 8.2 % — AB (ref 4.0–5.6)

## 2023-02-22 MED ORDER — DOXYCYCLINE HYCLATE 100 MG PO TABS
100.0000 mg | ORAL_TABLET | Freq: Two times a day (BID) | ORAL | 1 refills | Status: DC
Start: 2023-02-22 — End: 2023-05-30

## 2023-02-22 MED ORDER — TRAMADOL HCL 50 MG PO TABS: ORAL_TABLET | ORAL | 2 refills | Status: DC

## 2023-02-22 MED ORDER — TOPIRAMATE 25 MG PO TABS: 25.0000 mg | ORAL_TABLET | Freq: Every day | ORAL | 1 refills | Status: DC

## 2023-02-22 MED ORDER — FAMOTIDINE 20 MG PO TABS: 20.0000 mg | ORAL_TABLET | Freq: Every day | ORAL | 1 refills | Status: DC

## 2023-02-22 MED ORDER — TIRZEPATIDE 12.5 MG/0.5ML ~~LOC~~ SOAJ: 12.5000 mg | SUBCUTANEOUS | 5 refills | Status: DC

## 2023-02-22 MED ORDER — APIXABAN 5 MG PO TABS: 5.0000 mg | ORAL_TABLET | Freq: Two times a day (BID) | ORAL | 3 refills | Status: DC

## 2023-02-22 MED ORDER — ACETAMINOPHEN-CODEINE 300-30 MG PO TABS: 1.0000 | ORAL_TABLET | Freq: Four times a day (QID) | ORAL | 2 refills | Status: DC | PRN

## 2023-02-22 MED ORDER — GABAPENTIN 800 MG PO TABS
800.0000 mg | ORAL_TABLET | Freq: Three times a day (TID) | ORAL | 1 refills | Status: DC
Start: 2023-02-22 — End: 2023-02-24
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
## 2023-02-24 ENCOUNTER — Other Ambulatory Visit: Payer: Self-pay

## 2023-02-24 DIAGNOSIS — G8929 Other chronic pain: Secondary | ICD-10-CM

## 2023-02-24 MED ORDER — GABAPENTIN 800 MG PO TABS: 800.0000 mg | ORAL_TABLET | Freq: Three times a day (TID) | ORAL | 1 refills | Status: DC

## 2023-03-14 ENCOUNTER — Ambulatory Visit: Payer: 59 | Admitting: Nurse Practitioner

## 2023-03-14 ENCOUNTER — Ambulatory Visit
Admission: RE | Admit: 2023-03-14 | Discharge: 2023-03-14 | Disposition: A | Discharge status: Home / Self Care | Payer: 59 | Source: Ambulatory Visit | Attending: Nurse Practitioner | Admitting: Nurse Practitioner

## 2023-03-14 DIAGNOSIS — N950 Postmenopausal bleeding: Secondary | ICD-10-CM

## 2023-03-28 ENCOUNTER — Other Ambulatory Visit: Payer: Self-pay | Admitting: Nurse Practitioner

## 2023-03-28 ENCOUNTER — Ambulatory Visit: Payer: 59 | Admitting: Nurse Practitioner

## 2023-03-28 ENCOUNTER — Telehealth: Payer: Self-pay | Admitting: Nurse Practitioner

## 2023-03-28 DIAGNOSIS — E1169 Type 2 diabetes mellitus with other specified complication: Secondary | ICD-10-CM

## 2023-03-28 MED ORDER — TIRZEPATIDE 12.5 MG/0.5ML ~~LOC~~ SOAJ: 12.5000 mg | SUBCUTANEOUS | 3 refills | Status: DC

## 2023-03-28 NOTE — Telephone Encounter (Signed)
Lvm to r/s today's appointment. U/s results not ready-[name]

## 2023-04-03 ENCOUNTER — Telehealth: Payer: Self-pay | Admitting: Nurse Practitioner

## 2023-04-03 NOTE — Telephone Encounter (Signed)
I notified patient, per Alyssa, she will call her with u/s results since no available appointments until [facility]

## 2023-04-05 NOTE — Progress Notes (Signed)
Please let the patient know that her pelvic ultrasound showed multiple fibroids, with the largest one measure 4 cm in diameter. I am referring her to OBGYN for further evaluation. They will discuss with her what the next steps are.

## 2023-04-05 NOTE — Telephone Encounter (Signed)
Patient called back and had already called and got her pharmacy to refill her medicine

## 2023-04-05 NOTE — Telephone Encounter (Signed)
Left message for patient to give office a call.

## 2023-04-06 ENCOUNTER — Other Ambulatory Visit: Payer: Self-pay | Admitting: Nurse Practitioner

## 2023-04-06 DIAGNOSIS — D259 Leiomyoma of uterus, unspecified: Secondary | ICD-10-CM

## 2023-04-17 ENCOUNTER — Ambulatory Visit: Payer: 59

## 2023-04-17 DIAGNOSIS — E1169 Type 2 diabetes mellitus with other specified complication: Secondary | ICD-10-CM | POA: Diagnosis not present

## 2023-04-18 LAB — MICROALBUMIN / CREATININE URINE RATIO
Creatinine, Urine: 126.7 mg/dL
Microalb/Creat Ratio: 52 mg/g{creat} — ABNORMAL HIGH (ref 0–29)
Microalbumin, Urine: 66.5 ug/mL

## 2023-04-20 NOTE — Progress Notes (Signed)
Slightly increased microalbuminuria, last CMP shows normal kidney function labs. Will continue to monitor periodically.

## 2023-04-21 ENCOUNTER — Encounter: Payer: Self-pay | Admitting: Obstetrics and Gynecology

## 2023-05-02 ENCOUNTER — Telehealth: Payer: Self-pay | Admitting: Nurse Practitioner

## 2023-05-02 NOTE — Telephone Encounter (Signed)
Patient was notified she has 1 more refill on both her medicines.

## 2023-05-15 ENCOUNTER — Other Ambulatory Visit: Payer: Self-pay | Admitting: Nurse Practitioner

## 2023-05-15 ENCOUNTER — Telehealth: Payer: Self-pay

## 2023-05-15 DIAGNOSIS — R600 Localized edema: Secondary | ICD-10-CM

## 2023-05-16 NOTE — Telephone Encounter (Signed)
 Patient notified

## 2023-05-16 NOTE — Telephone Encounter (Signed)
 Left message for patient to give office a call back.

## 2023-05-19 ENCOUNTER — Other Ambulatory Visit: Payer: Self-pay | Admitting: Nurse Practitioner

## 2023-05-19 DIAGNOSIS — B009 Herpesviral infection, unspecified: Secondary | ICD-10-CM

## 2023-05-30 ENCOUNTER — Other Ambulatory Visit (HOSPITAL_COMMUNITY)
Admission: RE | Admit: 2023-05-30 | Discharge: 2023-05-30 | Disposition: A | Discharge status: Home / Self Care | Payer: 59 | Source: Ambulatory Visit | Attending: Obstetrics | Admitting: Obstetrics

## 2023-05-30 ENCOUNTER — Ambulatory Visit (INDEPENDENT_AMBULATORY_CARE_PROVIDER_SITE_OTHER): Payer: 59 | Admitting: Obstetrics

## 2023-05-30 ENCOUNTER — Other Ambulatory Visit: Payer: Self-pay

## 2023-05-30 ENCOUNTER — Encounter: Payer: Self-pay | Admitting: Obstetrics

## 2023-05-30 ENCOUNTER — Telehealth: Payer: Self-pay

## 2023-05-30 VITALS — BP 143/80 | HR 79 | Ht 65.0 in | Wt 259.0 lb

## 2023-05-30 DIAGNOSIS — Z79899 Other long term (current) drug therapy: Secondary | ICD-10-CM

## 2023-05-30 DIAGNOSIS — D219 Benign neoplasm of connective and other soft tissue, unspecified: Secondary | ICD-10-CM

## 2023-05-30 DIAGNOSIS — G8929 Other chronic pain: Secondary | ICD-10-CM

## 2023-05-30 DIAGNOSIS — I10 Essential (primary) hypertension: Secondary | ICD-10-CM

## 2023-05-30 DIAGNOSIS — N950 Postmenopausal bleeding: Secondary | ICD-10-CM | POA: Insufficient documentation

## 2023-05-30 DIAGNOSIS — E1169 Type 2 diabetes mellitus with other specified complication: Secondary | ICD-10-CM

## 2023-05-30 DIAGNOSIS — B009 Herpesviral infection, unspecified: Secondary | ICD-10-CM

## 2023-05-30 DIAGNOSIS — R600 Localized edema: Secondary | ICD-10-CM

## 2023-05-30 DIAGNOSIS — B3731 Acute candidiasis of vulva and vagina: Secondary | ICD-10-CM

## 2023-05-30 DIAGNOSIS — D259 Leiomyoma of uterus, unspecified: Secondary | ICD-10-CM | POA: Diagnosis not present

## 2023-05-30 DIAGNOSIS — J449 Chronic obstructive pulmonary disease, unspecified: Secondary | ICD-10-CM

## 2023-05-30 DIAGNOSIS — E1165 Type 2 diabetes mellitus with hyperglycemia: Secondary | ICD-10-CM

## 2023-05-30 MED ORDER — GABAPENTIN 800 MG PO TABS
800.0000 mg | ORAL_TABLET | Freq: Three times a day (TID) | ORAL | 1 refills | Status: DC
Start: 2023-05-30 — End: 2023-05-31

## 2023-05-30 MED ORDER — ACYCLOVIR 5 % EX OINT: TOPICAL_OINTMENT | CUTANEOUS | 1 refills | Status: DC

## 2023-05-30 MED ORDER — MONTELUKAST SODIUM 10 MG PO TABS: 10.0000 mg | ORAL_TABLET | Freq: Every day | ORAL | 1 refills | Status: DC

## 2023-05-30 MED ORDER — GLIMEPIRIDE 2 MG PO TABS: ORAL_TABLET | ORAL | 1 refills | Status: DC

## 2023-05-30 MED ORDER — TRELEGY ELLIPTA 100-62.5-25 MCG/ACT IN AEPB: 1.0000 | INHALATION_SPRAY | Freq: Every day | RESPIRATORY_TRACT | 3 refills | Status: DC

## 2023-05-30 MED ORDER — DAPAGLIFLOZIN PROPANEDIOL 10 MG PO TABS: 10.0000 mg | ORAL_TABLET | Freq: Every day | ORAL | 3 refills | Status: DC

## 2023-05-30 MED ORDER — PANTOPRAZOLE SODIUM 40 MG PO TBEC: 40.0000 mg | DELAYED_RELEASE_TABLET | Freq: Two times a day (BID) | ORAL | 1 refills | Status: DC

## 2023-05-30 MED ORDER — TRAMADOL HCL 50 MG PO TABS: ORAL_TABLET | ORAL | 2 refills | Status: DC

## 2023-05-30 MED ORDER — METOPROLOL TARTRATE 50 MG PO TABS: 50.0000 mg | ORAL_TABLET | Freq: Two times a day (BID) | ORAL | 3 refills | Status: DC

## 2023-05-30 MED ORDER — TRETINOIN 0.025 % EX CREA: 1.0000 | TOPICAL_CREAM | Freq: Every evening | CUTANEOUS | 11 refills | Status: DC | PRN

## 2023-05-30 MED ORDER — TIRZEPATIDE 12.5 MG/0.5ML ~~LOC~~ SOAJ: 12.5000 mg | SUBCUTANEOUS | 3 refills | Status: DC

## 2023-05-30 MED ORDER — LISINOPRIL 10 MG PO TABS: 10.0000 mg | ORAL_TABLET | Freq: Every day | ORAL | 3 refills | Status: DC

## 2023-05-30 MED ORDER — CETIRIZINE HCL 10 MG PO TABS: 10.0000 mg | ORAL_TABLET | Freq: Every day | ORAL | 1 refills | Status: DC | PRN

## 2023-05-30 MED ORDER — FUROSEMIDE 20 MG PO TABS: 20.0000 mg | ORAL_TABLET | Freq: Every day | ORAL | 1 refills | Status: DC

## 2023-05-30 MED ORDER — IPRATROPIUM-ALBUTEROL 0.5-2.5 (3) MG/3ML IN SOLN: 3.0000 mL | Freq: Four times a day (QID) | RESPIRATORY_TRACT | 3 refills | Status: DC | PRN

## 2023-05-30 MED ORDER — ACCU-CHEK FASTCLIX LANCETS MISC: 3 refills | Status: DC

## 2023-05-30 MED ORDER — TERBINAFINE HCL 250 MG PO TABS: 250.0000 mg | ORAL_TABLET | Freq: Every day | ORAL | 0 refills | Status: DC

## 2023-05-30 MED ORDER — ALBUTEROL SULFATE HFA 108 (90 BASE) MCG/ACT IN AERS
1.0000 | INHALATION_SPRAY | Freq: Four times a day (QID) | RESPIRATORY_TRACT | 5 refills | Status: DC | PRN
Start: 2023-05-30 — End: 2023-12-08

## 2023-05-30 MED ORDER — LINACLOTIDE 145 MCG PO CAPS: 145.0000 ug | ORAL_CAPSULE | Freq: Every day | ORAL | 3 refills | Status: DC

## 2023-05-30 MED ORDER — DOXYCYCLINE HYCLATE 100 MG PO TABS: 100.0000 mg | ORAL_TABLET | Freq: Two times a day (BID) | ORAL | 1 refills | Status: DC

## 2023-05-30 MED ORDER — ACYCLOVIR 400 MG PO TABS: 400.0000 mg | ORAL_TABLET | Freq: Two times a day (BID) | ORAL | 0 refills | Status: DC

## 2023-05-30 MED ORDER — BUPROPION HCL ER (XL) 300 MG PO TB24: 300.0000 mg | ORAL_TABLET | Freq: Every day | ORAL | 3 refills | Status: DC

## 2023-05-30 MED ORDER — TOPIRAMATE 25 MG PO TABS: 25.0000 mg | ORAL_TABLET | Freq: Every day | ORAL | 1 refills | Status: DC

## 2023-05-30 MED ORDER — FAMOTIDINE 20 MG PO TABS: 20.0000 mg | ORAL_TABLET | Freq: Every day | ORAL | 1 refills | Status: DC
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
## 2023-05-31 ENCOUNTER — Telehealth: Payer: Self-pay | Admitting: Nurse Practitioner

## 2023-05-31 ENCOUNTER — Encounter: Payer: Self-pay | Admitting: Nurse Practitioner

## 2023-05-31 ENCOUNTER — Ambulatory Visit: Payer: 59 | Admitting: Nurse Practitioner

## 2023-05-31 VITALS — BP 128/74 | HR 72 | Temp 98.2°F | Resp 16 | Ht 65.0 in | Wt 261.8 lb

## 2023-05-31 DIAGNOSIS — Z0000 Encounter for general adult medical examination without abnormal findings: Secondary | ICD-10-CM

## 2023-05-31 DIAGNOSIS — E785 Hyperlipidemia, unspecified: Secondary | ICD-10-CM

## 2023-05-31 DIAGNOSIS — Z23 Encounter for immunization: Secondary | ICD-10-CM

## 2023-05-31 DIAGNOSIS — E1169 Type 2 diabetes mellitus with other specified complication: Secondary | ICD-10-CM

## 2023-05-31 DIAGNOSIS — D219 Benign neoplasm of connective and other soft tissue, unspecified: Secondary | ICD-10-CM | POA: Insufficient documentation

## 2023-05-31 DIAGNOSIS — M5442 Lumbago with sciatica, left side: Secondary | ICD-10-CM | POA: Diagnosis not present

## 2023-05-31 DIAGNOSIS — M5441 Lumbago with sciatica, right side: Secondary | ICD-10-CM

## 2023-05-31 DIAGNOSIS — Z1231 Encounter for screening mammogram for malignant neoplasm of breast: Secondary | ICD-10-CM | POA: Diagnosis not present

## 2023-05-31 DIAGNOSIS — G8929 Other chronic pain: Secondary | ICD-10-CM

## 2023-05-31 DIAGNOSIS — N950 Postmenopausal bleeding: Secondary | ICD-10-CM | POA: Diagnosis present

## 2023-05-31 LAB — POCT GLYCOSYLATED HEMOGLOBIN (HGB A1C): Hemoglobin A1C: 9 % — AB (ref 4.0–5.6)

## 2023-05-31 MED ORDER — TETANUS-DIPHTH-ACELL PERTUSSIS 5-2.5-18.5 LF-MCG/0.5 IM SUSP: 0.5000 mL | Freq: Once | INTRAMUSCULAR | 0 refills | Status: AC

## 2023-05-31 MED ORDER — ATORVASTATIN CALCIUM 10 MG PO TABS: 10.0000 mg | ORAL_TABLET | Freq: Every day | ORAL | 1 refills | Status: DC

## 2023-05-31 MED ORDER — ALLOPURINOL 300 MG PO TABS: 300.0000 mg | ORAL_TABLET | Freq: Every day | ORAL | 1 refills | Status: DC

## 2023-05-31 MED ORDER — ACETAMINOPHEN-CODEINE 300-30 MG PO TABS: 1.0000 | ORAL_TABLET | Freq: Four times a day (QID) | ORAL | 2 refills | Status: DC | PRN

## 2023-05-31 MED ORDER — APIXABAN 5 MG PO TABS: 5.0000 mg | ORAL_TABLET | Freq: Two times a day (BID) | ORAL | 3 refills | Status: DC

## 2023-05-31 MED ORDER — GABAPENTIN 800 MG PO TABS: 800.0000 mg | ORAL_TABLET | Freq: Four times a day (QID) | ORAL | 5 refills | Status: DC

## 2023-05-31 NOTE — Telephone Encounter (Signed)
Awaiting 05/31/23 office notes for Ophthalmology referral-[name]
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
## 2023-06-01 ENCOUNTER — Telehealth: Payer: Self-pay

## 2023-06-01 NOTE — Progress Notes (Signed)
   06/01/2023  Patient ID: Brenda Santana, female   DOB: 26-Oct-1960, 63 y.o.   MRN: [id number]  Attempted to contact patient for medication management/review. Left HIPAA compliant message for patient to return my call at their convenience.   Second attempt for patient outreach. Will follow up with patient in 7-10 business days.  Thank you for allowing pharmacy to be a part of this patient's care.  Cephus Shelling, PharmD Clinical Pharmacist Cell: [phone]

## 2023-06-02 ENCOUNTER — Other Ambulatory Visit: Payer: Self-pay | Admitting: Nurse Practitioner

## 2023-06-02 DIAGNOSIS — E1165 Type 2 diabetes mellitus with hyperglycemia: Secondary | ICD-10-CM | POA: Diagnosis not present

## 2023-06-02 DIAGNOSIS — E559 Vitamin D deficiency, unspecified: Secondary | ICD-10-CM | POA: Diagnosis not present

## 2023-06-02 DIAGNOSIS — R718 Other abnormality of red blood cells: Secondary | ICD-10-CM | POA: Diagnosis not present

## 2023-06-02 DIAGNOSIS — E782 Mixed hyperlipidemia: Secondary | ICD-10-CM | POA: Diagnosis not present

## 2023-06-03 ENCOUNTER — Other Ambulatory Visit: Payer: Self-pay | Admitting: Nurse Practitioner

## 2023-06-03 DIAGNOSIS — G8929 Other chronic pain: Secondary | ICD-10-CM

## 2023-06-03 LAB — COMPREHENSIVE METABOLIC PANEL
ALT: 25 [IU]/L (ref 0–32)
AST: 21 [IU]/L (ref 0–40)
Albumin: 4.3 g/dL (ref 3.9–4.9)
Alkaline Phosphatase: 160 [IU]/L — ABNORMAL HIGH (ref 44–121)
BUN/Creatinine Ratio: 19 (ref 12–28)
BUN: 22 mg/dL (ref 8–27)
Bilirubin Total: 0.3 mg/dL (ref 0.0–1.2)
CO2: 24 mmol/L (ref 20–29)
Calcium: 9.7 mg/dL (ref 8.7–10.3)
Chloride: 100 mmol/L (ref 96–106)
Creatinine, Ser: 1.18 mg/dL — ABNORMAL HIGH (ref 0.57–1.00)
Globulin, Total: 2.8 g/dL (ref 1.5–4.5)
Glucose: 156 mg/dL — ABNORMAL HIGH (ref 70–99)
Potassium: 4.3 mmol/L (ref 3.5–5.2)
Sodium: 140 mmol/L (ref 134–144)
Total Protein: 7.1 g/dL (ref 6.0–8.5)
eGFR: 52 mL/min/{1.73_m2} — ABNORMAL LOW (ref >59)

## 2023-06-03 LAB — CBC WITH DIFFERENTIAL/PLATELET
Basophils Absolute: 0 10*3/uL (ref 0.0–0.2)
Basos: 0 %
EOS (ABSOLUTE): 0.1 10*3/uL (ref 0.0–0.4)
Eos: 1 %
Hematocrit: 45.8 % (ref 34.0–46.6)
Hemoglobin: 14.9 g/dL (ref 11.1–15.9)
Immature Grans (Abs): 0 10*3/uL (ref 0.0–0.1)
Immature Granulocytes: 0 %
Lymphocytes Absolute: 1.9 10*3/uL (ref 0.7–3.1)
Lymphs: 17 %
MCH: 31.8 pg (ref 26.6–33.0)
MCHC: 32.5 g/dL (ref 31.5–35.7)
MCV: 98 fL — ABNORMAL HIGH (ref 79–97)
Monocytes Absolute: 0.9 10*3/uL (ref 0.1–0.9)
Monocytes: 8 %
Neutrophils Absolute: 8 10*3/uL — ABNORMAL HIGH (ref 1.4–7.0)
Neutrophils: 74 %
Platelets: 306 10*3/uL (ref 150–450)
RBC: 4.69 x10E6/uL (ref 3.77–5.28)
RDW: 13 % (ref 11.7–15.4)
WBC: 10.9 10*3/uL — ABNORMAL HIGH (ref 3.4–10.8)

## 2023-06-03 LAB — VITAMIN D 25 HYDROXY (VIT D DEFICIENCY, FRACTURES): Vit D, 25-Hydroxy: 23 ng/mL — ABNORMAL LOW (ref 30.0–100.0)

## 2023-06-03 LAB — LIPID PANEL
Chol/HDL Ratio: 2.4 {ratio} (ref 0.0–4.4)
Cholesterol, Total: 172 mg/dL (ref 100–199)
HDL: 71 mg/dL (ref >39)
LDL Chol Calc (NIH): 87 mg/dL (ref 0–99)
Triglycerides: 72 mg/dL (ref 0–149)
VLDL Cholesterol Cal: 14 mg/dL (ref 5–40)

## 2023-06-05 LAB — SURGICAL PATHOLOGY

## 2023-06-05 NOTE — Telephone Encounter (Signed)
 Please review

## 2023-06-06 ENCOUNTER — Telehealth: Payer: Self-pay | Admitting: Nurse Practitioner

## 2023-06-06 NOTE — Telephone Encounter (Signed)
 Ophthalmology referral faxed to [facility]; [phone]. Notified patient. Gave pt telephone # [phone]

## 2023-06-08 ENCOUNTER — Telehealth: Payer: Self-pay | Admitting: Nurse Practitioner

## 2023-06-08 NOTE — Telephone Encounter (Signed)
 [facility] denied referral. Do not take patient's insurance per patient request, Ophthalmology referral sent via [employer] to [facility]

## 2023-06-09 ENCOUNTER — Telehealth: Payer: Self-pay | Admitting: Nurse Practitioner

## 2023-06-09 ENCOUNTER — Other Ambulatory Visit: Payer: Self-pay | Admitting: Physician Assistant

## 2023-06-09 DIAGNOSIS — G8929 Other chronic pain: Secondary | ICD-10-CM

## 2023-06-09 MED ORDER — ACETAMINOPHEN-CODEINE 300-30 MG PO TABS: 1.0000 | ORAL_TABLET | Freq: Four times a day (QID) | ORAL | 0 refills | Status: DC | PRN

## 2023-06-13 ENCOUNTER — Other Ambulatory Visit: Payer: Self-pay

## 2023-06-13 NOTE — Telephone Encounter (Signed)
Done

## 2023-06-19 ENCOUNTER — Telehealth: Payer: Self-pay

## 2023-06-19 DIAGNOSIS — Z79899 Other long term (current) drug therapy: Secondary | ICD-10-CM

## 2023-06-19 NOTE — Telephone Encounter (Signed)
error 

## 2023-06-19 NOTE — Progress Notes (Signed)
   06/19/2023  Patient ID: Brenda Santana, female   DOB: 1960-10-07, 63 y.o.   MRN: [id number]  Attempted to contact patient for medication management/review. Left HIPAA compliant message for patient to return my call at their convenience.   Third attempt for patient outreach. Will follow up with patient in 10-14 business days.  Thank you for allowing pharmacy to be a part of this patient's care.  Cephus Shelling, PharmD Clinical Pharmacist Cell: [phone]

## 2023-06-20 ENCOUNTER — Ambulatory Visit (INDEPENDENT_AMBULATORY_CARE_PROVIDER_SITE_OTHER): Payer: 59 | Admitting: Internal Medicine

## 2023-06-20 ENCOUNTER — Other Ambulatory Visit: Payer: Self-pay | Admitting: Internal Medicine

## 2023-06-20 ENCOUNTER — Encounter: Payer: Self-pay | Admitting: Internal Medicine

## 2023-06-20 VITALS — BP 130/80 | HR 86 | Temp 98.5°F | Resp 16 | Ht 65.0 in | Wt 261.0 lb

## 2023-06-20 DIAGNOSIS — Z794 Long term (current) use of insulin: Secondary | ICD-10-CM

## 2023-06-20 DIAGNOSIS — E1169 Type 2 diabetes mellitus with other specified complication: Secondary | ICD-10-CM

## 2023-06-20 DIAGNOSIS — Z6841 Body Mass Index (BMI) 40.0 and over, adult: Secondary | ICD-10-CM

## 2023-06-20 DIAGNOSIS — Z7952 Long term (current) use of systemic steroids: Secondary | ICD-10-CM

## 2023-06-20 LAB — GLUCOSE, POCT (MANUAL RESULT ENTRY): POC Glucose: 181 mg/dL — AB (ref 70–99)

## 2023-06-20 MED ORDER — LEVEMIR FLEXTOUCH 100 UNIT/ML ~~LOC~~ SOPN: PEN_INJECTOR | SUBCUTANEOUS | 11 refills | Status: DC

## 2023-06-20 NOTE — Telephone Encounter (Signed)
Please review and send lantus
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
## 2023-06-21 ENCOUNTER — Other Ambulatory Visit: Payer: Self-pay

## 2023-06-21 MED ORDER — INSULIN GLARGINE 100 UNITS/ML SOLOSTAR PEN: PEN_INJECTOR | SUBCUTANEOUS | 11 refills | Status: DC

## 2023-06-21 MED ORDER — INSULIN PEN NEEDLE 32G X 4 MM MISC: 1 refills | Status: AC

## 2023-06-21 NOTE — Telephone Encounter (Signed)
As per dfk sent lantus and pen needles with same direction at [facility]

## 2023-07-04 ENCOUNTER — Other Ambulatory Visit: Payer: Self-pay | Admitting: Nurse Practitioner

## 2023-07-04 DIAGNOSIS — Z79899 Other long term (current) drug therapy: Secondary | ICD-10-CM

## 2023-07-05 ENCOUNTER — Telehealth: Payer: Self-pay | Admitting: Nurse Practitioner

## 2023-07-05 ENCOUNTER — Telehealth: Payer: Self-pay

## 2023-07-05 ENCOUNTER — Other Ambulatory Visit: Payer: Self-pay | Admitting: Nurse Practitioner

## 2023-07-05 ENCOUNTER — Other Ambulatory Visit: Payer: Self-pay

## 2023-07-05 DIAGNOSIS — Z79899 Other long term (current) drug therapy: Secondary | ICD-10-CM

## 2023-07-05 DIAGNOSIS — E1169 Type 2 diabetes mellitus with other specified complication: Secondary | ICD-10-CM

## 2023-07-05 DIAGNOSIS — Z0000 Encounter for general adult medical examination without abnormal findings: Secondary | ICD-10-CM

## 2023-07-05 MED ORDER — TIRZEPATIDE 12.5 MG/0.5ML ~~LOC~~ SOAJ: 12.5000 mg | SUBCUTANEOUS | 3 refills | Status: DC

## 2023-07-05 MED ORDER — TIRZEPATIDE 12.5 MG/0.5ML ~~LOC~~ SOAJ
12.5000 mg | SUBCUTANEOUS | 0 refills | Status: DC
Start: 2023-07-05 — End: 2023-07-05

## 2023-07-05 NOTE — Telephone Encounter (Addendum)
 [facility]it  called [phone] that please canceled M[facility]o pres fro [facility] we [facility] days to [facility] he will call [facility] and change processing

## 2023-07-05 NOTE — Telephone Encounter (Signed)
 Ophthalmology appointment 07/13/2023 @ [name]

## 2023-07-10 ENCOUNTER — Other Ambulatory Visit: Payer: Self-pay

## 2023-07-10 DIAGNOSIS — Z79899 Other long term (current) drug therapy: Secondary | ICD-10-CM

## 2023-07-10 NOTE — Patient Instructions (Signed)
 Ms. Linzy,   It was a pleasure speaking with you today.   Check your blood sugars twice daily:  1) Fasting, first thing in the morning before breakfast and  2) 2 hours after your largest meal.   For a goal A1c of less than 7%, goal fasting readings are less than 130 and goal 2 hour after meal readings are less than 180.    Thank you for allowing pharmacy to be a part of this patient's care. Cephus Shelling, PharmD Clinical Pharmacist Cell: [phone]

## 2023-07-10 NOTE — Progress Notes (Signed)
 07/10/2023 Name: Brenda Santana MRN: [id number] DOB: 03-10-1961  Chief Complaint  Patient presents with   Diabetes    Brenda Santana is a 63 y.o. year old female who presented for a telephone visit.   They were referred to the pharmacist by a quality report for assistance in managing diabetes.    Subjective:  Care Team: Primary Care Provider: Sallyanne Kuster, NP ; Next Scheduled Visit: 08/21/2023  Medication Access/Adherence  Current Pharmacy:  [id number] [place], [place] - [address] [place] [address] Phone: [phone] Fax: [phone]  [id number] [place], [place] - [address] [place] [address] Phone: [phone] Fax: [phone]  [facility] - [place], [place] - [address] [place] [address] Phone: [phone] Fax: [phone]   Patient reports affordability concerns with their medications: No  Patient reports access/transportation concerns to their pharmacy: No  Patient reports adherence concerns with their medications:  No     Diabetes:  Current medications: Farxiga 10 mg daily, Lantus 6 units daily (prescribed 6-12 units), Mounjaro 12.5 mg once weekly (Mondays) Medications tried in the past: Invokmet, Levemir, metformin, Ozempic, Victoza, glimepiride (stopped d/t insulin)  Current glucose readings: fasting (per patient) 181 mg/dL (clinic POC)  Using no meter; testing 0 times daily -- Claims FreeStyle Libre doesn't stay on her arm; checks BG at doctor's office -- Reports her last glucose machine prescribed May 2024  Patient denies hypoglycemic s/sx including dizziness, shakiness, sweating. Patient reports hyperglycemic symptoms including polyuria, polydipsia,  nocturia.  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  - She reports that she endorses chronic pain   The 10-year ASCVD risk score (Arnett DK, et al., 2019) is: 14.3%   Values used to calculate the score:     Age: 71 years     Sex: Female     Is Non-Hispanic African American: Yes     Diabetic: Yes     Tobacco smoker: No     Systolic Blood Pressure: 130 mmHg     Is BP treated: Yes     HDL Cholesterol: 71 mg/dL     Total Cholesterol: 172 mg/dL  Objective:  Lab Results  Component Value Date   HGBA1C 9.0 (A) 05/31/2023    Lab Results  Component Value Date   CREATININE 1.18 (H) 06/02/2023   BUN 22 06/02/2023   NA 140 06/02/2023   K 4.3 06/02/2023   CL 100 06/02/2023   CO2 24 06/02/2023    Lab Results  Component Value Date   CHOL 172 06/02/2023   HDL 71 06/02/2023   LDLCALC 87 06/02/2023   TRIG 72 06/02/2023   CHOLHDL 2.4 06/02/2023     Medications Reviewed Today     Reviewed by Katha Cabal, RPH (Pharmacist) on 07/11/23 at 0846  Med List Status: <None>   Medication Order Taking? Sig Documenting Provider Last Dose Status Informant  Accu-Chek FastClix Lancets MISC [id number] No USE  AS DIRECTED  Patient not taking: Reported on 07/10/2023   Sallyanne Kuster, NP Not Taking Active   acetaminophen-codeine (TYLENOL #3) 300-30 MG tablet [id number] Yes Take 1 tablet by mouth every 6 (six) hours as needed for moderate pain (pain score 4-6) or severe pain (pain score 7-10). for pain McDonough, Salomon Fick, PA-C Taking Active   acyclovir (ZOVIRAX) 400 MG tablet [id number] Yes Take 1 tablet (400 mg total) by mouth 2 (two) times daily. Sallyanne Kuster, NP Taking Active   acyclovir ointment  (ZOVIRAX) 5 % [id number] Yes APPLY TOPICALLY EVERY 3 (THREE) HOURS. Sallyanne Kuster, NP Taking Active            Med Note Para March, [facility] R   Mon Jul 10, 2023  2:19 PM) Taking PRn  albuterol (VENTOLIN HFA) 108 (90 Base) MCG/ACT inhaler [id number] Yes Inhale 1-2 puffs into the lungs every 6 (six) hours as needed for wheezing or shortness of breath. Sallyanne Kuster, NP Taking Active   allopurinol (ZYLOPRIM) 300 MG tablet [id number] Yes Take 1 tablet (300 mg total) by mouth daily. Sallyanne Kuster, NP Taking Active   apixaban (ELIQUIS) 5 MG TABS tablet [id number] Yes Take 1 tablet (5 mg total) by mouth 2 (two) times daily. Sallyanne Kuster, NP Taking Active   AREXVY 120 MCG/0.5ML injection [id number] Yes  [provider] Taking Active   aspirin EC 81 MG tablet [id number] Yes Take 81 mg by mouth daily. [provider] Taking Active Self  atorvastatin (LIPITOR) 10 MG tablet [id number] Yes Take 1 tablet (10 mg total) by mouth daily. Sallyanne Kuster, NP Taking Active   Biotin 5 MG CAPS [id number] Yes Take 5 mg by mouth daily.  [provider] Taking Active Self  buPROPion (WELLBUTRIN XL) 300 MG 24 hr tablet [id number] Yes Take 1 tablet (300 mg total) by mouth daily. Sallyanne Kuster, NP Taking Active   cetirizine (ZYRTEC ALLERGY) 10 MG tablet [id number] Yes Take 1 tablet (10 mg total) by mouth daily as needed for allergies. Sallyanne Kuster, NP Taking Active   clindamycin (CLINDAGEL) 1 % gel [id number] Yes APPLY TOPICALLY 2 (TWO) TIMES DAILY. TO AFFECTED AREA FOR 2 WEEKS OR UNTIL RESOLVED Abernathy, Alyssa, NP Taking Active   clobetasol cream (TEMOVATE) 0.05 % [id number] Yes Apply 1 application topically 2 (two) times daily as needed (for eczema on hands). [provider] Taking Active            Med Note Para March, [facility] R   Mon Jul 10, 2023  2:22 PM) PRN  dapagliflozin propanediol (FARXIGA) 10 MG TABS tablet [id number] Yes Take 1 tablet (10 mg total) by mouth daily.  Sallyanne Kuster, NP Taking Active   diclofenac Sodium (VOLTAREN) 1 % GEL [id number] Yes daily as needed. [provider] Taking Active   docusate sodium (COLACE) 100 MG capsule [id number] Yes Take 100 mg by mouth daily.  [provider] Taking Active Self  doxycycline (VIBRA-TABS) 100 MG tablet [id number] Yes Take 1 tablet (100 mg total) by mouth 2 (two) times daily. Sallyanne Kuster, NP Taking Active   famotidine (PEPCID) 20 MG tablet [id number] Yes Take 1 tablet (20 mg total) by mouth daily. Sallyanne Kuster, NP Taking Active   Ferrous Sulfate (IRON) 325 (65 Fe) MG TABS [id number] Yes Take 325 mg by mouth 3 (three) times daily.  [provider] Taking Active Self           Med Note Manson Passey, Janey Genta   Fri Feb 07, 2020 10:04 AM)    Fluticasone-Umeclidin-Vilant (TRELEGY ELLIPTA)  100-62.5-25 MCG/ACT AEPB [id number] Yes Inhale 1 puff into the lungs daily. Sallyanne Kuster, NP Taking Active   furosemide (LASIX) 20 MG tablet [id number] Yes Take 1 tablet (20 mg total) by mouth daily. Sallyanne Kuster, NP Taking Active   gabapentin (NEURONTIN) 800 MG tablet [id number] Yes Take 1 tablet (800 mg total) by mouth in the morning, at noon, in the evening, and at bedtime. Sallyanne Kuster, NP Taking Active   glimepiride (AMARYL) 2 MG tablet [id number] No TAKE 1 TABLET BY MOUTH EVERY DAY BEFORE BREAKFAST  Patient not taking: Reported on 07/11/2023   Sallyanne Kuster, NP Not Taking Active   glucose blood (ACCU-CHEK GUIDE) test strip [id number] No Use 1 test strip daily to check glucose with glucose meter  Patient not taking: Reported on 07/10/2023   Sallyanne Kuster, NP Not Taking Active   insulin glargine (LANTUS) 100 unit/mL SOPN [id number] Yes Inject 6-12 units at supper time per sliding scale. Lyndon Code, MD Taking Active            Med Note Para March, [facility] R   Mon Jul 10, 2023  2:28 PM) Patient is currently taking 6 units daily  Insulin Pen Needle 32G X 4 MM MISC [id number] Yes Use as  directed once daily with lantus Lyndon Code, MD Taking Active   ipratropium-albuterol (DUONEB) 0.5-2.5 (3) MG/3ML Criss Rosales [id number] Yes Take 3 mLs by nebulization every 6 (six) hours as needed (for shortness of breath or wheezing). Sallyanne Kuster, NP Taking Active   lidocaine (XYLOCAINE) 5 % ointment [id number] Yes Apply topically 3 (three) times daily as needed for mild pain or moderate pain. To affected area Sallyanne Kuster, NP Taking Active            Med Note Para March, [facility] R   Mon Jul 10, 2023  2:23 PM) PRN  linaclotide Karlene Einstein) 145 MCG CAPS capsule [id number] Yes Take 1 capsule (145 mcg total) by mouth daily before breakfast. Sallyanne Kuster, NP Taking Active   lisinopril (ZESTRIL) 10 MG tablet [id number] Yes Take 1 tablet (10 mg total) by mouth daily. Sallyanne Kuster, NP Taking Active   metoprolol tartrate (LOPRESSOR) 50 MG tablet [id number] Yes Take 1 tablet (50 mg total) by mouth 2 (two) times daily. Sallyanne Kuster, NP Taking Active   montelukast (SINGULAIR) 10 MG tablet [id number] Yes Take 1 tablet (10 mg total) by mouth at bedtime. For asthma Sallyanne Kuster, NP Taking Active   nystatin (MYCOSTATIN/NYSTOP) powder [id number] Yes APPLY 1 APPLICATION TOPICALLY IN THE MORNING, AT NOON, IN THE EVENING, AND AT BEDTIME. [provider] Taking Active   oxymetazoline (AFRIN) 0.05 % nasal spray [id number] Yes Place 1 spray into both nostrils 2 (two) times daily as needed for congestion. [provider] Taking Active Self  pantoprazole (PROTONIX) 40 MG tablet [id number] Yes Take 1 tablet (40 mg total) by mouth 2 (two) times daily. Sallyanne Kuster, NP Taking Active   predniSONE (DELTASONE) 10 MG tablet [id number] Yes TAKE 2 TABLETS BY MOUTH EVERY DAY Abernathy, Alyssa, NP Taking Active   PREVNAR 20 0.5 ML injection [id number] Yes  [provider] Taking Active   terbinafine (LAMISIL) 250 MG tablet [id number] No Take 1 tablet (250 mg total) by mouth daily.  Patient not  taking: Reported on 07/10/2023   Sallyanne Kuster, NP Not Taking Active   tirzepatide [facility]) 12.5 MG/0.5ML Pen [id number] Yes Inject 12.5 mg into the skin once a week. Sallyanne Kuster, NP Taking Active   topiramate (TOPAMAX) 25 MG tablet [id number] No Take 1  tablet (25 mg total) by mouth daily.  Patient not taking: Reported on 07/10/2023   Sallyanne Kuster, NP Not Taking Active   tretinoin (RETIN-A) 0.025 % cream [id number] Yes Apply 1 application  topically at bedtime as needed (for eczema on face). Sallyanne Kuster, NP Taking Active   valACYclovir (VALTREX) 500 MG tablet [id number] No Take by mouth.  Patient not taking: Reported on 07/11/2023   [provider] Not Taking Active               Assessment/Plan:   Diabetes: - Currently uncontrolled. The patient does not have a glucometer to check her blood glucose levels. I educated the patient on the importance of regularly monitoring her blood glucose, not just during clinic visits. She expressed willingness to begin checking her blood glucose at home with a glucometer. The patient reported that the glucometer at her home is not functioning. I informed her that insurance may not cover the cost of a new glucose meter since it has not been a year since the last one was ordered. I suggested she consider using [employer] [employer] diabetic supplies as an alternative.  - Reviewed long term cardiovascular and renal outcomes of uncontrolled blood sugar - Reviewed goal A1c, goal fasting, and goal 2 hour post prandial glucose - Reviewed dietary modifications including: switch from regular beverages to diet or low sugar drinks  - Reviewed lifestyle modifications including: encouraged to exercise 150 minute/week by starting off with short walks for 15 -30 minutes - Recommend to continue current regimen. Patient is due for an updated hemoglobin A1c at the next appointment. - Recommend to check glucose twice daily (fasting and post prandial).  Please consider order for diabetic supplies to be billed via insurance.      Follow Up Plan: 2-4 weeks  Thank you for allowing pharmacy to be a part of this patient's care. Cephus Shelling, PharmD Clinical Pharmacist Cell: [phone]

## 2023-07-13 DIAGNOSIS — E119 Type 2 diabetes mellitus without complications: Secondary | ICD-10-CM | POA: Diagnosis not present

## 2023-07-17 ENCOUNTER — Other Ambulatory Visit: Payer: Self-pay

## 2023-07-17 DIAGNOSIS — J449 Chronic obstructive pulmonary disease, unspecified: Secondary | ICD-10-CM

## 2023-07-17 DIAGNOSIS — G8929 Other chronic pain: Secondary | ICD-10-CM

## 2023-07-17 DIAGNOSIS — Z79899 Other long term (current) drug therapy: Secondary | ICD-10-CM

## 2023-07-17 DIAGNOSIS — I10 Essential (primary) hypertension: Secondary | ICD-10-CM

## 2023-07-17 DIAGNOSIS — R600 Localized edema: Secondary | ICD-10-CM

## 2023-07-17 DIAGNOSIS — E1165 Type 2 diabetes mellitus with hyperglycemia: Secondary | ICD-10-CM

## 2023-07-17 MED ORDER — LINACLOTIDE 145 MCG PO CAPS: 145.0000 ug | ORAL_CAPSULE | Freq: Every day | ORAL | 3 refills | Status: DC

## 2023-07-17 MED ORDER — GABAPENTIN 800 MG PO TABS: 800.0000 mg | ORAL_TABLET | Freq: Four times a day (QID) | ORAL | 5 refills | Status: DC

## 2023-07-17 MED ORDER — TOPIRAMATE 25 MG PO TABS: 25.0000 mg | ORAL_TABLET | Freq: Every day | ORAL | 1 refills | Status: DC

## 2023-07-17 MED ORDER — MONTELUKAST SODIUM 10 MG PO TABS: 10.0000 mg | ORAL_TABLET | Freq: Every day | ORAL | 1 refills | Status: DC

## 2023-07-17 MED ORDER — CETIRIZINE HCL 10 MG PO TABS: 10.0000 mg | ORAL_TABLET | Freq: Every day | ORAL | 1 refills | Status: DC | PRN

## 2023-07-17 MED ORDER — DAPAGLIFLOZIN PROPANEDIOL 10 MG PO TABS: 10.0000 mg | ORAL_TABLET | Freq: Every day | ORAL | 3 refills | Status: DC

## 2023-07-17 MED ORDER — FUROSEMIDE 20 MG PO TABS: 20.0000 mg | ORAL_TABLET | Freq: Every day | ORAL | 1 refills | Status: DC

## 2023-07-17 MED ORDER — LISINOPRIL 10 MG PO TABS: 10.0000 mg | ORAL_TABLET | Freq: Every day | ORAL | 3 refills | Status: DC

## 2023-07-26 ENCOUNTER — Telehealth: Payer: Self-pay | Admitting: Nurse Practitioner

## 2023-07-26 DIAGNOSIS — G8929 Other chronic pain: Secondary | ICD-10-CM

## 2023-07-27 ENCOUNTER — Other Ambulatory Visit: Payer: Self-pay

## 2023-07-27 ENCOUNTER — Telehealth: Payer: Self-pay

## 2023-07-27 DIAGNOSIS — L720 Epidermal cyst: Secondary | ICD-10-CM

## 2023-07-27 DIAGNOSIS — B009 Herpesviral infection, unspecified: Secondary | ICD-10-CM

## 2023-07-27 DIAGNOSIS — J449 Chronic obstructive pulmonary disease, unspecified: Secondary | ICD-10-CM

## 2023-07-27 DIAGNOSIS — Z79899 Other long term (current) drug therapy: Secondary | ICD-10-CM

## 2023-07-27 DIAGNOSIS — L732 Hidradenitis suppurativa: Secondary | ICD-10-CM

## 2023-07-27 MED ORDER — ACETAMINOPHEN-CODEINE 300-30 MG PO TABS: 1.0000 | ORAL_TABLET | Freq: Four times a day (QID) | ORAL | 0 refills | Status: DC | PRN

## 2023-07-27 MED ORDER — TRELEGY ELLIPTA 100-62.5-25 MCG/ACT IN AEPB: 1.0000 | INHALATION_SPRAY | Freq: Every day | RESPIRATORY_TRACT | 3 refills | Status: DC

## 2023-07-27 MED ORDER — CLINDAMYCIN PHOSPHATE 1 % EX GEL: Freq: Two times a day (BID) | CUTANEOUS | 1 refills | Status: DC

## 2023-07-27 MED ORDER — ACYCLOVIR 400 MG PO TABS: 400.0000 mg | ORAL_TABLET | Freq: Two times a day (BID) | ORAL | 0 refills | Status: DC

## 2023-07-27 NOTE — Telephone Encounter (Signed)
 Left patient to notified her about her medicine send in.

## 2023-07-27 NOTE — Telephone Encounter (Signed)
 Refill sent.

## 2023-07-27 NOTE — Progress Notes (Signed)
   07/27/2023  Patient ID: Brenda Santana, female   DOB: 05/23/1960, 63 y.o.   MRN: [id number]  Attempted to contact patient for medication management/review. Left HIPAA compliant message for patient to return my call at their convenience.   Third attempt for patient outreach. Will follow up with patient in 3 weeks (or sooner if patient returns call before that time). I called to follow up on diabetic testing supplies since patient was willing to restart. Please consider diabetic testing supplies and glucometer. Next PCP appointment on 08/21/2023.  Thank you for allowing pharmacy to be a part of this patient's care.  Cephus Shelling, PharmD Clinical Pharmacist Cell: [phone]

## 2023-08-01 ENCOUNTER — Other Ambulatory Visit: Payer: Self-pay

## 2023-08-01 MED ORDER — ACCU-CHEK GUIDE ME W/DEVICE KIT: PACK | 0 refills | Status: DC

## 2023-08-13 ENCOUNTER — Other Ambulatory Visit: Payer: Self-pay | Admitting: Nurse Practitioner

## 2023-08-13 DIAGNOSIS — B009 Herpesviral infection, unspecified: Secondary | ICD-10-CM

## 2023-08-20 ENCOUNTER — Other Ambulatory Visit: Payer: Self-pay | Admitting: Nurse Practitioner

## 2023-08-20 DIAGNOSIS — Z0000 Encounter for general adult medical examination without abnormal findings: Secondary | ICD-10-CM

## 2023-08-21 ENCOUNTER — Encounter: Payer: Self-pay | Admitting: Nurse Practitioner

## 2023-08-21 ENCOUNTER — Ambulatory Visit (INDEPENDENT_AMBULATORY_CARE_PROVIDER_SITE_OTHER): Payer: 59 | Admitting: Nurse Practitioner

## 2023-08-21 ENCOUNTER — Telehealth: Payer: Self-pay | Admitting: Nurse Practitioner

## 2023-08-21 VITALS — BP 128/80 | Temp 98.1°F | Resp 16 | Ht 65.0 in | Wt 252.8 lb

## 2023-08-21 DIAGNOSIS — E1169 Type 2 diabetes mellitus with other specified complication: Secondary | ICD-10-CM

## 2023-08-21 DIAGNOSIS — I152 Hypertension secondary to endocrine disorders: Secondary | ICD-10-CM

## 2023-08-21 DIAGNOSIS — E785 Hyperlipidemia, unspecified: Secondary | ICD-10-CM

## 2023-08-21 DIAGNOSIS — Z79899 Other long term (current) drug therapy: Secondary | ICD-10-CM | POA: Diagnosis not present

## 2023-08-21 DIAGNOSIS — E1159 Type 2 diabetes mellitus with other circulatory complications: Secondary | ICD-10-CM | POA: Diagnosis not present

## 2023-08-21 DIAGNOSIS — G8929 Other chronic pain: Secondary | ICD-10-CM | POA: Diagnosis not present

## 2023-08-21 DIAGNOSIS — Z1231 Encounter for screening mammogram for malignant neoplasm of breast: Secondary | ICD-10-CM | POA: Diagnosis not present

## 2023-08-21 DIAGNOSIS — M5441 Lumbago with sciatica, right side: Secondary | ICD-10-CM | POA: Diagnosis not present

## 2023-08-21 DIAGNOSIS — J449 Chronic obstructive pulmonary disease, unspecified: Secondary | ICD-10-CM | POA: Diagnosis not present

## 2023-08-21 DIAGNOSIS — M5442 Lumbago with sciatica, left side: Secondary | ICD-10-CM

## 2023-08-21 MED ORDER — ACCU-CHEK FASTCLIX LANCETS MISC
3 refills | Status: DC
Start: 2023-08-21 — End: 2023-10-09

## 2023-08-21 MED ORDER — TIRZEPATIDE 15 MG/0.5ML ~~LOC~~ SOAJ: 15.0000 mg | SUBCUTANEOUS | 3 refills | Status: DC

## 2023-08-21 MED ORDER — APIXABAN 5 MG PO TABS: 5.0000 mg | ORAL_TABLET | Freq: Two times a day (BID) | ORAL | 3 refills | Status: DC

## 2023-08-21 MED ORDER — ALLOPURINOL 300 MG PO TABS: 300.0000 mg | ORAL_TABLET | Freq: Every day | ORAL | 1 refills | Status: DC

## 2023-08-21 MED ORDER — BUPROPION HCL ER (XL) 300 MG PO TB24: 300.0000 mg | ORAL_TABLET | Freq: Every day | ORAL | 3 refills | Status: DC

## 2023-08-21 MED ORDER — FAMOTIDINE 20 MG PO TABS: 20.0000 mg | ORAL_TABLET | Freq: Every day | ORAL | 1 refills | Status: DC

## 2023-08-21 MED ORDER — GLIMEPIRIDE 2 MG PO TABS
ORAL_TABLET | ORAL | 1 refills | Status: DC
Start: 2023-08-21 — End: 2023-10-31

## 2023-08-21 MED ORDER — METOPROLOL TARTRATE 50 MG PO TABS: 50.0000 mg | ORAL_TABLET | Freq: Two times a day (BID) | ORAL | 3 refills | Status: DC

## 2023-08-21 MED ORDER — GABAPENTIN 800 MG PO TABS: 800.0000 mg | ORAL_TABLET | Freq: Four times a day (QID) | ORAL | 0 refills | Status: DC

## 2023-08-21 MED ORDER — DOXYCYCLINE HYCLATE 100 MG PO TABS: 100.0000 mg | ORAL_TABLET | Freq: Two times a day (BID) | ORAL | 1 refills | Status: DC

## 2023-08-21 MED ORDER — ACCU-CHEK GUIDE TEST VI STRP: ORAL_STRIP | 12 refills | Status: DC

## 2023-08-21 MED ORDER — IPRATROPIUM-ALBUTEROL 0.5-2.5 (3) MG/3ML IN SOLN: 3.0000 mL | Freq: Four times a day (QID) | RESPIRATORY_TRACT | 3 refills | Status: DC | PRN

## 2023-08-21 MED ORDER — ATORVASTATIN CALCIUM 10 MG PO TABS: 10.0000 mg | ORAL_TABLET | Freq: Every day | ORAL | 1 refills | Status: DC

## 2023-08-21 MED ORDER — PREDNISONE 10 MG PO TABS: 20.0000 mg | ORAL_TABLET | Freq: Every day | ORAL | 3 refills | Status: DC

## 2023-08-21 MED ORDER — PANTOPRAZOLE SODIUM 40 MG PO TBEC: 40.0000 mg | DELAYED_RELEASE_TABLET | Freq: Two times a day (BID) | ORAL | 1 refills | Status: DC

## 2023-08-21 NOTE — Telephone Encounter (Signed)
 Lvm to return call regarding mammogram-Brenda Santana
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
## 2023-08-23 ENCOUNTER — Other Ambulatory Visit: Payer: Self-pay

## 2023-08-23 ENCOUNTER — Telehealth: Payer: Self-pay

## 2023-08-23 ENCOUNTER — Other Ambulatory Visit (HOSPITAL_COMMUNITY): Payer: Self-pay

## 2023-08-23 DIAGNOSIS — E1140 Type 2 diabetes mellitus with diabetic neuropathy, unspecified: Secondary | ICD-10-CM

## 2023-08-23 DIAGNOSIS — Z79899 Other long term (current) drug therapy: Secondary | ICD-10-CM

## 2023-08-23 NOTE — Telephone Encounter (Signed)
 Pharmacy Patient Advocate Encounter  Insurance verification completed.   The patient is insured through [employer] claim for Dexcom 7. Currently a quantity of 3 is a 30 day supply and the co-pay is $0 . The current 30 day co-pay is, $0.  No PA needed at this time.  This test claim was processed through [facility]- copay amounts may vary at other pharmacies due to pharmacy/plan contracts, or as the patient moves through the different stages of their insurance plan.

## 2023-08-23 NOTE — Progress Notes (Signed)
 08/23/2023 Name: Brenda Santana MRN: [id number] DOB: Sep 23, 1960  Chief Complaint  Patient presents with   Diabetes    Brenda Santana is a 63 y.o. year old female who presented for a telephone visit.   They were referred to the pharmacist by a quality report for assistance in managing diabetes.    Subjective:  Care Team: Primary Care Provider: Laurence Pons, NP ; Next Scheduled Visit: 10/02/2023   Medication Access/Adherence  Current Pharmacy:  [id number] [place], [place] - [address] [place] [address] Phone: [phone] Fax: [phone]  [id number] - [place], [place] - [address] [place] [address] Phone: [phone] Fax: [phone]  [facility] - [place], [place] - [address] [place] [address] Phone: [phone] Fax: [phone]  [facility] [address] [place] - [address] [place] [address] Phone: [phone] Fax: [phone]   Patient reports affordability concerns with their medications: No  Patient reports access/transportation concerns to their pharmacy: No  Patient reports adherence concerns with their medications:  No     Diabetes:  Current medications: Mounjaro  15 mg once weekly (Mondays; denies GI s/e), Lantus  5 units daily, Farxiga  10 mg daily,  Medications tried in the past: glimepiride  2 mg daily (d/c'd on/ce lantus  started per note by Dr. Meredeth Stallion on 06/17/2023 "DC Glimepiride , add long acting insulin  6 units with supper, need to do with")  Current glucose readings: 89-120  Using Accu Chek meter; testing one times daily before a meal    Patient denies hypoglycemic s/sx including dizziness, shakiness, sweating. Patient denies hyperglycemic symptoms including polyuria, polydipsia, polyphagia, nocturia, neuropathy, blurred vision.  Current meal patterns:  - Breakfast: boiled eggs (1-2), occasionally sausage  McMuffin, mini banana muffin - Lunch: hamburger (w/one-half bun), three boiled chicken legs, fruits, lasagna - Supper: baked chicken, steak (no bread), corn (loves)  - Snacks: nuts, dried fruit, ginger snaps, meat skins, chocolate candy bar w/almonds  - Drinks (reduced sodas): mostly water, fruit juice, some times ginger ale soda, tea (regular)  Current physical activity: Sedentary Lifestyle/no traditional exercise  - walks up/downstairs (not for exercise); short walk to the mailbox   Hyperlipidemia/ASCVD Risk Reduction  Current lipid lowering medications: atorvastatin  10 mg daily    The 10-year ASCVD risk score (Arnett DK, et al., 2019) is: 14.8%   Values used to calculate the score:     Age: 67 years     Sex: Female     Is Non-Hispanic African American: Yes     Diabetic: Yes     Tobacco smoker: No     Systolic Blood Pressure: 128 mmHg     Is BP treated: Yes     HDL Cholesterol: 71 mg/dL     Total Cholesterol: 172 mg/dL    Objective:  Lab Results  Component Value Date   HGBA1C 9.0 (A) 05/31/2023    Lab Results  Component Value Date   CREATININE 1.18 (H) 06/02/2023   BUN 22 06/02/2023   NA 140 06/02/2023   K 4.3 06/02/2023   CL 100 06/02/2023   CO2 24 06/02/2023    Lab Results  Component Value Date   CHOL 172 06/02/2023   HDL 71 06/02/2023   LDLCALC 87 06/02/2023   TRIG 72 06/02/2023   CHOLHDL 2.4 06/02/2023    Medications Reviewed Today     Reviewed by Amedeo Bailiff, RPH (  Pharmacist) on 08/23/23 at 1339  Med List Status: <None>   Medication Order Taking? Sig Documenting Provider Last Dose Status Informant  Accu-Chek FastClix Lancets MISC [id number] Yes Use 1 lancet to check glucose for diabetes once daily and as needed dx code E11.65 Laurence Pons, NP Taking Active   acetaminophen -codeine  (TYLENOL  #3) 300-30 MG tablet [id number] Yes Take 1 tablet by mouth every 6 (six) hours as needed for moderate pain (pain score 4-6) or severe pain (pain score  7-10). for pain Laurence Pons, NP Taking Active   acyclovir  (ZOVIRAX ) 400 MG tablet [id number] Yes TAKE 1 TABLET BY MOUTH TWICE A DAY Abernathy, Alyssa, NP Taking Active   acyclovir  ointment (ZOVIRAX ) 5 % [id number] Yes APPLY TOPICALLY EVERY 3 (THREE) HOURS. Laurence Pons, NP Taking Active            Med Note Vallarie Gauze, [facility] R   Mon Jul 10, 2023  2:19 PM) Taking PRn  albuterol  (VENTOLIN  HFA) 108 (90 Base) MCG/ACT inhaler [id number] Yes Inhale 1-2 puffs into the lungs every 6 (six) hours as needed for wheezing or shortness of breath. Laurence Pons, NP Taking Active   allopurinol  (ZYLOPRIM ) 300 MG tablet [id number] Yes Take 1 tablet (300 mg total) by mouth daily. Laurence Pons, NP Taking Active   apixaban  (ELIQUIS ) 5 MG TABS tablet [id number] Yes Take 1 tablet (5 mg total) by mouth 2 (two) times daily. Laurence Pons, NP Taking Active   aspirin  EC 81 MG tablet [id number] Yes Take 81 mg by mouth daily. [provider] Taking Active Self  atorvastatin  (LIPITOR) 10 MG tablet [id number] Yes Take 1 tablet (10 mg total) by mouth daily. Laurence Pons, NP Taking Active   Biotin  5 MG CAPS [id number] No Take 5 mg by mouth daily.   Patient not taking: Reported on 08/23/2023   [provider] Not Taking Active Self  Blood Glucose Monitoring Suppl (ACCU-CHEK GUIDE ME) w/Device KIT [id number] Yes Use as directed ATF57.32 Laurence Pons, NP Taking Active   buPROPion  (WELLBUTRIN  XL) 300 MG 24 hr tablet [id number] Yes Take 1 tablet (300 mg total) by mouth daily. Laurence Pons, NP Taking Active   cetirizine  (ZYRTEC  ALLERGY) 10 MG tablet [id number] Yes Take 1 tablet (10 mg total) by mouth daily as needed for allergies. Laurence Pons, NP Taking Active   clindamycin  (CLINDAGEL) 1 % gel [id number] Yes Apply topically 2 (two) times daily. To affected area for 2 weeks or until resolved Laurence Pons, NP Taking Active            Med Note Vallarie Gauze, [facility]em[facility] Pourciau R   Wed Aug 23, 2023  1:30  PM) Takes PRN  clobetasol cream (TEMOVATE) 0.05 % [id number] Yes Apply 1 application topically 2 (two) times daily as needed (for eczema on hands). [provider] Taking Active            Med Note Vallarie Gauze, [facility] R   Mon Jul 10, 2023  2:22 PM) PRN  dapagliflozin  propanediol (FARXIGA ) 10 MG TABS tablet [id number] Yes Take 1 tablet (10 mg total) by mouth daily. Laurence Pons, NP Taking Active   diclofenac Sodium (VOLTAREN) 1 % GEL [id number] Yes daily as needed. [provider] Taking Active            Med Note Vallarie Gauze, [facility] R   Wed Aug 23, 2023  1:31 PM) Taking PRN  docusate sodium (COLACE) 100 MG capsule [id number] Yes Take 100 mg by mouth daily.  [provider] Taking Active Self  doxycycline  (VIBRA -TABS) 100 MG tablet  [id number] Yes Take 1 tablet (100 mg total) by mouth 2 (two) times daily. Laurence Pons, NP Taking Active   famotidine  (PEPCID ) 20 MG tablet [id number] Yes Take 1 tablet (20 mg total) by mouth daily. Laurence Pons, NP Taking Active   Ferrous Sulfate  (IRON) 325 (65 Fe) MG TABS [id number] Yes Take 325 mg by mouth 3 (three) times daily.  [provider] Taking Active Self           Med Note Geni Keto   Fri Feb 07, 2020 10:04 AM)    Fluticasone -Umeclidin-Vilant (TRELEGY ELLIPTA ) 100-62.5-25 MCG/ACT AEPB [id number] Yes Inhale 1 puff into the lungs daily. Laurence Pons, NP Taking Active   furosemide  (LASIX ) 20 MG tablet [id number] Yes Take 1 tablet (20 mg total) by mouth daily. Laurence Pons, NP Taking Active   gabapentin  (NEURONTIN ) 800 MG tablet [id number] Yes Take 1 tablet (800 mg total) by mouth in the morning, at noon, in the evening, and at bedtime. Laurence Pons, NP Taking Active   glimepiride  (AMARYL ) 2 MG tablet [id number] Yes TAKE 1 TABLET BY MOUTH EVERY DAY BEFORE BREAKFAST Abernathy, Alyssa, NP Taking Active   glucose blood (ACCU-CHEK GUIDE TEST) test strip [id number] Yes Use 1 test strip to check glucose for diabetes  once daily and as needed dx code E11.65 Laurence Pons, NP Taking Active   insulin  glargine (LANTUS ) 100 unit/mL SOPN [id number] Yes Inject 6-12 units at supper time per sliding scale. Lawton Price, MD Taking Active            Med Note Vallarie Gauze, [facility] R   Mon Jul 10, 2023  2:28 PM) Patient is currently taking 6 units daily  Insulin  Pen Needle 32G X 4 MM MISC [id number] Yes Use as directed once daily with lantus  Khan, Fozia M, MD Taking Active   ipratropium-albuterol  (DUONEB) 0.5-2.5 (3) MG/3ML Leonard Raker [id number] Yes Take 3 mLs by nebulization every 6 (six) hours as needed (for shortness of breath or wheezing). Laurence Pons, NP Taking Active   lidocaine  (XYLOCAINE ) 5 % ointment [id number] Yes Apply topically 3 (three) times daily as needed for mild pain or moderate pain. To affected area Laurence Pons, NP Taking Active            Med Note Vallarie Gauze, [facility] R   Mon Jul 10, 2023  2:23 PM) PRN  linaclotide  (LINZESS ) 145 MCG CAPS capsule [id number] Yes Take 1 capsule (145 mcg total) by mouth daily before breakfast. Laurence Pons, NP Taking Active   lisinopril  (ZESTRIL ) 10 MG tablet [id number] Yes Take 1 tablet (10 mg total) by mouth daily. Laurence Pons, NP Taking Active   metoprolol  tartrate (LOPRESSOR ) 50 MG tablet [id number] Yes Take 1 tablet (50 mg total) by mouth 2 (two) times daily. Laurence Pons, NP Taking Active   montelukast  (SINGULAIR ) 10 MG tablet [id number] Yes Take 1 tablet (10 mg total) by mouth at bedtime. For asthma Laurence Pons, NP Taking Active   nystatin  (MYCOSTATIN /NYSTOP ) powder [id number] Yes APPLY 1 APPLICATION TOPICALLY IN THE MORNING, AT NOON, IN THE EVENING, AND AT BEDTIME. [provider] Taking Active   oxymetazoline  (AFRIN) 0.05 % nasal spray [id number] Yes Place 1 spray into both nostrils 2 (two) times daily as needed for congestion. [provider] Taking Active Self           Med Note Vallarie Gauze, [facility] R   Wed Aug 23, 2023  1:35 PM) Taking PRN   pantoprazole  (PROTONIX ) 40 MG tablet [id number] Yes Take 1 tablet (40 mg total) by mouth 2 (  two) times daily. Laurence Pons, NP Taking Active   predniSONE  (DELTASONE ) 10 MG tablet [id number] Yes Take 2 tablets (20 mg total) by mouth daily. Laurence Pons, NP Taking Active   terbinafine  (LAMISIL ) 250 MG tablet [id number] No Take 1 tablet (250 mg total) by mouth daily.  Patient not taking: Reported on 08/23/2023   Laurence Pons, NP Not Taking Active   tirzepatide  (MOUNJARO ) 15 MG/0.5ML Pen [id number] Yes Inject 15 mg into the skin once a week. Laurence Pons, NP Taking Active   topiramate  (TOPAMAX ) 25 MG tablet [id number] Yes Take 1 tablet (25 mg total) by mouth daily. Laurence Pons, NP Taking Active   tretinoin  (RETIN-A ) 0.025 % cream [id number] Yes Apply 1 application  topically at bedtime as needed (for eczema on face). Laurence Pons, NP Taking Active               Assessment/Plan:   Diabetes: - Currently uncontrolled as per last hemoglobin A1c results. She continues not to be interested in CGM as it did not "stick" on her arm well yet she did not have an extra adhesive tape to keep it on. Test claim for $0 copay. Discussed if she is interested to ask her PCP for a sample since it has been a while since she last had a CGM Dexcom.  - Reviewed long term cardiovascular and renal outcomes of uncontrolled blood sugar - Reviewed goal A1c, goal fasting, and goal 2 hour post prandial glucose - Reviewed dietary modifications including: read labels for carbohydrates based on serving size for snacks (15 g carbohydrates) - Encouraged patient to start walking back and forth to the mailbox for an extended period of time (such as 10 minutes) if she is physically able to do so - Recommend to continue current regimen  - Recommend to check glucose fasting and post prandial alternating checking readings  - Patient is due for hemoglobin A1c, will defer lab for PCP.        Hyperlipidemia/ASCVD Risk Reduction: - Currently controlled based on last lipid panel.  - Reviewed long term complications of uncontrolled cholesterol - Recommend to continue current regimen     Follow Up Plan: PharmD telephone in 4 weeks   Alexandria Angel, PharmD Clinical Pharmacist Cell: [phone]

## 2023-08-26 ENCOUNTER — Other Ambulatory Visit: Payer: Self-pay | Admitting: Nurse Practitioner

## 2023-08-26 DIAGNOSIS — G8929 Other chronic pain: Secondary | ICD-10-CM

## 2023-08-28 ENCOUNTER — Telehealth: Payer: Self-pay

## 2023-08-30 ENCOUNTER — Telehealth: Payer: Self-pay

## 2023-08-30 NOTE — Telephone Encounter (Signed)
 Patient called stating that the 15mg  of mounjaro  was making her sick, spoke with alyssa she advised patient to stop taking the 15mg  and she can go back to the 12.5mg .

## 2023-08-30 NOTE — Telephone Encounter (Signed)
Sent message to [employer]

## 2023-08-31 MED ORDER — TIRZEPATIDE 12.5 MG/0.5ML ~~LOC~~ SOAJ: 12.5000 mg | SUBCUTANEOUS | 1 refills | Status: DC

## 2023-09-01 NOTE — Telephone Encounter (Signed)
 Tried to call patient.

## 2023-09-01 NOTE — Telephone Encounter (Signed)
 Patient notified

## 2023-09-02 LAB — MICROALBUMIN, URINE: Microalb, Ur: <30

## 2023-09-18 ENCOUNTER — Telehealth: Payer: Self-pay

## 2023-09-18 DIAGNOSIS — G8929 Other chronic pain: Secondary | ICD-10-CM

## 2023-09-19 MED ORDER — ACETAMINOPHEN-CODEINE 300-30 MG PO TABS: 1.0000 | ORAL_TABLET | Freq: Four times a day (QID) | ORAL | 0 refills | Status: DC | PRN

## 2023-09-19 NOTE — Telephone Encounter (Signed)
 Pt advised that Brenda Santana sent med

## 2023-09-21 DIAGNOSIS — Z1231 Encounter for screening mammogram for malignant neoplasm of breast: Secondary | ICD-10-CM | POA: Diagnosis not present

## 2023-09-22 ENCOUNTER — Encounter: Payer: Self-pay | Admitting: Nurse Practitioner

## 2023-09-22 DIAGNOSIS — E119 Type 2 diabetes mellitus without complications: Secondary | ICD-10-CM | POA: Insufficient documentation

## 2023-09-26 ENCOUNTER — Other Ambulatory Visit: Payer: Self-pay

## 2023-10-02 ENCOUNTER — Telehealth: Payer: Self-pay | Admitting: Nurse Practitioner

## 2023-10-02 ENCOUNTER — Ambulatory Visit: Admitting: Nurse Practitioner

## 2023-10-02 ENCOUNTER — Encounter: Payer: Self-pay | Admitting: Nurse Practitioner

## 2023-10-02 ENCOUNTER — Telehealth: Payer: Self-pay

## 2023-10-02 VITALS — BP 136/88 | Temp 97.4°F | Resp 16 | Ht 65.0 in | Wt 254.4 lb

## 2023-10-02 DIAGNOSIS — I739 Peripheral vascular disease, unspecified: Secondary | ICD-10-CM | POA: Diagnosis not present

## 2023-10-02 DIAGNOSIS — L732 Hidradenitis suppurativa: Secondary | ICD-10-CM | POA: Diagnosis not present

## 2023-10-02 DIAGNOSIS — M5442 Lumbago with sciatica, left side: Secondary | ICD-10-CM

## 2023-10-02 DIAGNOSIS — E1169 Type 2 diabetes mellitus with other specified complication: Secondary | ICD-10-CM | POA: Diagnosis not present

## 2023-10-02 DIAGNOSIS — R6889 Other general symptoms and signs: Secondary | ICD-10-CM | POA: Insufficient documentation

## 2023-10-02 DIAGNOSIS — Z794 Long term (current) use of insulin: Secondary | ICD-10-CM | POA: Diagnosis not present

## 2023-10-02 DIAGNOSIS — M5441 Lumbago with sciatica, right side: Secondary | ICD-10-CM

## 2023-10-02 DIAGNOSIS — G8929 Other chronic pain: Secondary | ICD-10-CM

## 2023-10-02 LAB — POCT GLYCOSYLATED HEMOGLOBIN (HGB A1C): Hemoglobin A1C: 9.8 % — AB (ref 4.0–5.6)

## 2023-10-02 MED ORDER — CYCLOBENZAPRINE HCL 10 MG PO TABS: 5.0000 mg | ORAL_TABLET | Freq: Two times a day (BID) | ORAL | 2 refills | Status: DC | PRN

## 2023-10-02 MED ORDER — GABAPENTIN 800 MG PO TABS
800.0000 mg | ORAL_TABLET | Freq: Four times a day (QID) | ORAL | 0 refills | Status: DC
Start: 2023-10-02 — End: 2024-02-28

## 2023-10-02 MED ORDER — ACETAMINOPHEN-CODEINE 300-30 MG PO TABS: 1.0000 | ORAL_TABLET | Freq: Four times a day (QID) | ORAL | 2 refills | Status: DC | PRN

## 2023-10-02 MED ORDER — CLINDAMYCIN PHOSPHATE 1 % EX SOLN: Freq: Two times a day (BID) | CUTANEOUS | 5 refills | Status: DC

## 2023-10-02 NOTE — Telephone Encounter (Signed)
 Vascular referral sent via Epic to [facility]. Highlighted for patient on AVS-[name]
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
## 2023-10-04 ENCOUNTER — Other Ambulatory Visit: Payer: Self-pay

## 2023-10-04 ENCOUNTER — Telehealth: Payer: Self-pay

## 2023-10-04 NOTE — Telephone Encounter (Signed)
 Patient already paid for it out of pocket.

## 2023-10-04 NOTE — Telephone Encounter (Signed)
 Lmom to call us  regarding her test strips refills she already had 11 refills

## 2023-10-05 ENCOUNTER — Other Ambulatory Visit (INDEPENDENT_AMBULATORY_CARE_PROVIDER_SITE_OTHER): Payer: Self-pay | Admitting: Nurse Practitioner

## 2023-10-05 DIAGNOSIS — R6889 Other general symptoms and signs: Secondary | ICD-10-CM

## 2023-10-05 DIAGNOSIS — M79606 Pain in leg, unspecified: Secondary | ICD-10-CM

## 2023-10-09 ENCOUNTER — Encounter: Payer: Self-pay | Admitting: Nurse Practitioner

## 2023-10-09 ENCOUNTER — Ambulatory Visit (INDEPENDENT_AMBULATORY_CARE_PROVIDER_SITE_OTHER): Admitting: Vascular Surgery

## 2023-10-09 ENCOUNTER — Ambulatory Visit (INDEPENDENT_AMBULATORY_CARE_PROVIDER_SITE_OTHER)

## 2023-10-09 ENCOUNTER — Other Ambulatory Visit: Payer: Self-pay

## 2023-10-09 ENCOUNTER — Encounter (INDEPENDENT_AMBULATORY_CARE_PROVIDER_SITE_OTHER)

## 2023-10-09 ENCOUNTER — Encounter (INDEPENDENT_AMBULATORY_CARE_PROVIDER_SITE_OTHER): Payer: Self-pay | Admitting: Vascular Surgery

## 2023-10-09 VITALS — BP 153/80 | HR 86 | Resp 18 | Wt 255.4 lb

## 2023-10-09 DIAGNOSIS — E1140 Type 2 diabetes mellitus with diabetic neuropathy, unspecified: Secondary | ICD-10-CM

## 2023-10-09 DIAGNOSIS — I10 Essential (primary) hypertension: Secondary | ICD-10-CM

## 2023-10-09 DIAGNOSIS — M79606 Pain in leg, unspecified: Secondary | ICD-10-CM | POA: Diagnosis not present

## 2023-10-09 DIAGNOSIS — R6889 Other general symptoms and signs: Secondary | ICD-10-CM

## 2023-10-09 DIAGNOSIS — M79604 Pain in right leg: Secondary | ICD-10-CM | POA: Diagnosis not present

## 2023-10-09 DIAGNOSIS — M79605 Pain in left leg: Secondary | ICD-10-CM | POA: Diagnosis not present

## 2023-10-09 DIAGNOSIS — E1169 Type 2 diabetes mellitus with other specified complication: Secondary | ICD-10-CM

## 2023-10-09 LAB — VAS US ABI WITH/WO TBI
Left ABI: 1.11
Right ABI: 1.14

## 2023-10-09 MED ORDER — ACCU-CHEK GUIDE TEST VI STRP: ORAL_STRIP | 12 refills | Status: AC

## 2023-10-09 MED ORDER — ACCU-CHEK FASTCLIX LANCETS MISC: 3 refills | Status: DC
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
## 2023-10-16 ENCOUNTER — Telehealth: Payer: Self-pay

## 2023-10-16 NOTE — Progress Notes (Signed)
   10/16/2023  Patient ID: Brenda Santana, female   DOB: December 27, 1960, 63 y.o.   MRN: [id number]  Attempted to contact patient for medication management/review. Left HIPAA compliant message for patient to return my call at their convenience. Reviewed chart and patient adherence portal prior to outreach. Follow up on diabetes adherence, checking BG, see if patient is open for FreeStyle Libre CGM (previously tried Dexcom).   Will follow up with patient in 2 business weeks.  Thank you for allowing pharmacy to be a part of this patient's care.  Alexandria Angel, PharmD Clinical Pharmacist Cell: [phone]

## 2023-10-17 ENCOUNTER — Other Ambulatory Visit: Payer: Self-pay | Admitting: Nurse Practitioner

## 2023-10-17 DIAGNOSIS — G8929 Other chronic pain: Secondary | ICD-10-CM

## 2023-10-17 DIAGNOSIS — I739 Peripheral vascular disease, unspecified: Secondary | ICD-10-CM

## 2023-10-19 ENCOUNTER — Ambulatory Visit

## 2023-10-19 ENCOUNTER — Other Ambulatory Visit: Payer: Self-pay

## 2023-10-19 DIAGNOSIS — E1169 Type 2 diabetes mellitus with other specified complication: Secondary | ICD-10-CM

## 2023-10-19 MED ORDER — ACCU-CHEK SOFTCLIX LANCETS MISC: 3 refills | Status: AC

## 2023-10-20 ENCOUNTER — Ambulatory Visit (INDEPENDENT_AMBULATORY_CARE_PROVIDER_SITE_OTHER): Admitting: Nurse Practitioner

## 2023-10-20 ENCOUNTER — Encounter: Payer: Self-pay | Admitting: Nurse Practitioner

## 2023-10-20 VITALS — BP 137/83 | HR 89 | Temp 98.1°F | Resp 16 | Ht 65.0 in | Wt 254.0 lb

## 2023-10-20 DIAGNOSIS — Z794 Long term (current) use of insulin: Secondary | ICD-10-CM

## 2023-10-20 DIAGNOSIS — E1169 Type 2 diabetes mellitus with other specified complication: Secondary | ICD-10-CM | POA: Diagnosis not present

## 2023-10-20 DIAGNOSIS — M5441 Lumbago with sciatica, right side: Secondary | ICD-10-CM

## 2023-10-20 DIAGNOSIS — M5442 Lumbago with sciatica, left side: Secondary | ICD-10-CM | POA: Diagnosis not present

## 2023-10-20 DIAGNOSIS — G8929 Other chronic pain: Secondary | ICD-10-CM

## 2023-10-20 DIAGNOSIS — Z23 Encounter for immunization: Secondary | ICD-10-CM

## 2023-10-20 MED ORDER — TETANUS-DIPHTH-ACELL PERTUSSIS 5-2.5-18.5 LF-MCG/0.5 IM SUSP: 0.5000 mL | Freq: Once | INTRAMUSCULAR | 0 refills | Status: DC | PRN
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
## 2023-10-23 ENCOUNTER — Other Ambulatory Visit: Payer: Self-pay

## 2023-10-23 ENCOUNTER — Emergency Department

## 2023-10-23 ENCOUNTER — Emergency Department: Admission: EM | Admit: 2023-10-23 | Discharge: 2023-10-23 | Disposition: A | Discharge status: Home / Self Care

## 2023-10-23 DIAGNOSIS — E119 Type 2 diabetes mellitus without complications: Secondary | ICD-10-CM | POA: Diagnosis not present

## 2023-10-23 DIAGNOSIS — M25519 Pain in unspecified shoulder: Secondary | ICD-10-CM | POA: Insufficient documentation

## 2023-10-23 DIAGNOSIS — R519 Headache, unspecified: Secondary | ICD-10-CM | POA: Diagnosis not present

## 2023-10-23 DIAGNOSIS — M25561 Pain in right knee: Secondary | ICD-10-CM | POA: Insufficient documentation

## 2023-10-23 DIAGNOSIS — M25551 Pain in right hip: Secondary | ICD-10-CM | POA: Diagnosis not present

## 2023-10-23 DIAGNOSIS — M7918 Myalgia, other site: Secondary | ICD-10-CM

## 2023-10-23 DIAGNOSIS — M25552 Pain in left hip: Secondary | ICD-10-CM | POA: Insufficient documentation

## 2023-10-23 DIAGNOSIS — M25562 Pain in left knee: Secondary | ICD-10-CM | POA: Insufficient documentation

## 2023-10-23 DIAGNOSIS — M79652 Pain in left thigh: Secondary | ICD-10-CM | POA: Diagnosis not present

## 2023-10-23 DIAGNOSIS — Y92410 Unspecified street and highway as the place of occurrence of the external cause: Secondary | ICD-10-CM | POA: Diagnosis not present

## 2023-10-23 DIAGNOSIS — Z041 Encounter for examination and observation following transport accident: Secondary | ICD-10-CM | POA: Diagnosis not present

## 2023-10-23 DIAGNOSIS — M1711 Unilateral primary osteoarthritis, right knee: Secondary | ICD-10-CM | POA: Diagnosis not present

## 2023-10-23 DIAGNOSIS — M791 Myalgia, unspecified site: Secondary | ICD-10-CM | POA: Diagnosis not present

## 2023-10-23 DIAGNOSIS — I10 Essential (primary) hypertension: Secondary | ICD-10-CM | POA: Diagnosis not present

## 2023-10-23 MED ORDER — HYDROCODONE-ACETAMINOPHEN 5-325 MG PO TABS: 1.0000 | ORAL_TABLET | Freq: Four times a day (QID) | ORAL | 0 refills | Status: DC | PRN
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
## 2023-10-25 ENCOUNTER — Other Ambulatory Visit: Payer: Self-pay | Admitting: Internal Medicine
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
## 2023-10-30 ENCOUNTER — Other Ambulatory Visit: Payer: Self-pay

## 2023-10-30 ENCOUNTER — Telehealth: Payer: Self-pay

## 2023-10-30 NOTE — Progress Notes (Signed)
   10/30/2023  Patient ID: Brenda Santana, female   DOB: October 23, 1960, 63 y.o.   MRN: [id number]  Attempted to contact patient for medication management/review. Left HIPAA compliant message for patient to return my call at their convenience.    Will follow up with patient.   Thank you for allowing pharmacy to be a part of this patient's care.  Dorcas Solian, PharmD Clinical Pharmacist Cell: [phone]
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
## 2023-10-31 ENCOUNTER — Ambulatory Visit (INDEPENDENT_AMBULATORY_CARE_PROVIDER_SITE_OTHER): Admitting: Nurse Practitioner

## 2023-10-31 ENCOUNTER — Encounter: Payer: Self-pay | Admitting: Nurse Practitioner

## 2023-10-31 ENCOUNTER — Encounter: Payer: Self-pay | Admitting: Orthopedic Surgery

## 2023-10-31 ENCOUNTER — Ambulatory Visit (INDEPENDENT_AMBULATORY_CARE_PROVIDER_SITE_OTHER): Admitting: Orthopedic Surgery

## 2023-10-31 VITALS — BP 144/79 | HR 81 | Temp 97.8°F | Resp 16 | Ht 65.0 in | Wt 258.4 lb

## 2023-10-31 DIAGNOSIS — M79605 Pain in left leg: Secondary | ICD-10-CM | POA: Diagnosis not present

## 2023-10-31 DIAGNOSIS — M5442 Lumbago with sciatica, left side: Secondary | ICD-10-CM

## 2023-10-31 DIAGNOSIS — M48061 Spinal stenosis, lumbar region without neurogenic claudication: Secondary | ICD-10-CM | POA: Diagnosis not present

## 2023-10-31 DIAGNOSIS — M79604 Pain in right leg: Secondary | ICD-10-CM | POA: Diagnosis not present

## 2023-10-31 DIAGNOSIS — G8929 Other chronic pain: Secondary | ICD-10-CM | POA: Diagnosis not present

## 2023-10-31 DIAGNOSIS — M5410 Radiculopathy, site unspecified: Secondary | ICD-10-CM

## 2023-10-31 DIAGNOSIS — M5441 Lumbago with sciatica, right side: Secondary | ICD-10-CM | POA: Diagnosis not present

## 2023-10-31 DIAGNOSIS — M48062 Spinal stenosis, lumbar region with neurogenic claudication: Secondary | ICD-10-CM

## 2023-10-31 DIAGNOSIS — M8938 Hypertrophy of bone, other site: Secondary | ICD-10-CM

## 2023-10-31 DIAGNOSIS — Z79899 Other long term (current) drug therapy: Secondary | ICD-10-CM

## 2023-10-31 DIAGNOSIS — M47816 Spondylosis without myelopathy or radiculopathy, lumbar region: Secondary | ICD-10-CM

## 2023-10-31 DIAGNOSIS — G44209 Tension-type headache, unspecified, not intractable: Secondary | ICD-10-CM | POA: Diagnosis not present

## 2023-10-31 DIAGNOSIS — J449 Chronic obstructive pulmonary disease, unspecified: Secondary | ICD-10-CM

## 2023-10-31 DIAGNOSIS — M5416 Radiculopathy, lumbar region: Secondary | ICD-10-CM

## 2023-10-31 MED ORDER — HYDROCODONE-ACETAMINOPHEN 5-325 MG PO TABS: 1.0000 | ORAL_TABLET | Freq: Four times a day (QID) | ORAL | 0 refills | Status: DC | PRN

## 2023-10-31 MED ORDER — PANTOPRAZOLE SODIUM 40 MG PO TBEC: 40.0000 mg | DELAYED_RELEASE_TABLET | Freq: Two times a day (BID) | ORAL | 1 refills | Status: DC

## 2023-10-31 MED ORDER — MONTELUKAST SODIUM 10 MG PO TABS
10.0000 mg | ORAL_TABLET | Freq: Every day | ORAL | 1 refills | Status: DC
Start: 2023-10-31 — End: 2024-02-03

## 2023-10-31 MED ORDER — TOPIRAMATE 25 MG PO TABS
25.0000 mg | ORAL_TABLET | Freq: Every day | ORAL | 1 refills | Status: DC
Start: 2023-10-31 — End: 2024-02-03

## 2023-10-31 MED ORDER — TIZANIDINE HCL 4 MG PO TABS: 4.0000 mg | ORAL_TABLET | Freq: Three times a day (TID) | ORAL | 2 refills | Status: DC | PRN

## 2023-10-31 MED ORDER — SUMATRIPTAN SUCCINATE 50 MG PO TABS: 50.0000 mg | ORAL_TABLET | Freq: Every day | ORAL | 0 refills | Status: DC | PRN

## 2023-10-31 NOTE — Patient Instructions (Signed)
 It was so nice to see you today. Thank you so much for coming in.    Your MRI from 2023 shows some wear and tear in your back.   I want to get an updated MRI of your lower back to look into things further. We will get this approved through your insurance and [facility] will call you to schedule the appointment. Ask about your patient responsibility. You do not need to pay this prior to getting MRI, they can bill you.   Make sure they schedule you in the WIDE BORE MRI. Remind them to get your xrays when you have the MRI done.   After you have the MRI and xrays, it takes 14-28 days for me to get the results back. Once I have the results, we will call you to schedule a follow up phone visit with me to review them.   Please do not hesitate to call if you have any questions or concerns. You can also message me in MyChart.   Glade Boys PA-C [phone]     The physicians and staff at [facility] at [facility] are committed to providing excellent care. You may receive a survey asking for feedback about your experience at our office. We value you your feedback and appreciate you taking the time to to fill it out. The [facility] leadership team is also available to discuss your experience in person, feel free to contact us  [phone].
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
## 2023-11-01 ENCOUNTER — Ambulatory Visit: Admitting: Nurse Practitioner

## 2023-11-02 ENCOUNTER — Encounter: Payer: Self-pay | Admitting: Nurse Practitioner

## 2023-11-07 ENCOUNTER — Telehealth: Payer: Self-pay | Admitting: Orthopedic Surgery

## 2023-11-07 ENCOUNTER — Ambulatory Visit
Admission: RE | Admit: 2023-11-07 | Discharge: 2023-11-07 | Disposition: A | Discharge status: Home / Self Care | Source: Ambulatory Visit | Attending: Orthopedic Surgery | Admitting: Orthopedic Surgery

## 2023-11-07 DIAGNOSIS — M48062 Spinal stenosis, lumbar region with neurogenic claudication: Secondary | ICD-10-CM

## 2023-11-07 DIAGNOSIS — M47816 Spondylosis without myelopathy or radiculopathy, lumbar region: Secondary | ICD-10-CM | POA: Diagnosis not present

## 2023-11-07 DIAGNOSIS — M5416 Radiculopathy, lumbar region: Secondary | ICD-10-CM

## 2023-11-07 DIAGNOSIS — M4854XA Collapsed vertebra, not elsewhere classified, thoracic region, initial encounter for fracture: Secondary | ICD-10-CM | POA: Diagnosis not present

## 2023-11-07 DIAGNOSIS — S22080A Wedge compression fracture of T11-T12 vertebra, initial encounter for closed fracture: Secondary | ICD-10-CM | POA: Insufficient documentation

## 2023-11-07 DIAGNOSIS — M4316 Spondylolisthesis, lumbar region: Secondary | ICD-10-CM | POA: Insufficient documentation

## 2023-11-07 DIAGNOSIS — M545 Low back pain, unspecified: Secondary | ICD-10-CM | POA: Diagnosis not present

## 2023-11-07 DIAGNOSIS — M4317 Spondylolisthesis, lumbosacral region: Secondary | ICD-10-CM | POA: Insufficient documentation

## 2023-11-07 MED ORDER — DIAZEPAM 5 MG PO TABS: ORAL_TABLET | ORAL | 0 refills | Status: DC
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
## 2023-11-07 NOTE — Telephone Encounter (Signed)
 She was scheduled for her MRI today but got scared and could not complete the test. They told her to request medication and then she could reschedule. [facility]

## 2023-11-10 ENCOUNTER — Ambulatory Visit
Admission: RE | Admit: 2023-11-10 | Discharge: 2023-11-10 | Disposition: A | Discharge status: Home / Self Care | Source: Ambulatory Visit | Attending: Orthopedic Surgery | Admitting: Orthopedic Surgery

## 2023-11-10 DIAGNOSIS — M5416 Radiculopathy, lumbar region: Secondary | ICD-10-CM | POA: Diagnosis present

## 2023-11-10 DIAGNOSIS — M4316 Spondylolisthesis, lumbar region: Secondary | ICD-10-CM | POA: Insufficient documentation

## 2023-11-10 DIAGNOSIS — M47816 Spondylosis without myelopathy or radiculopathy, lumbar region: Secondary | ICD-10-CM | POA: Insufficient documentation

## 2023-11-10 DIAGNOSIS — M48062 Spinal stenosis, lumbar region with neurogenic claudication: Secondary | ICD-10-CM | POA: Insufficient documentation

## 2023-11-10 DIAGNOSIS — M5116 Intervertebral disc disorders with radiculopathy, lumbar region: Secondary | ICD-10-CM | POA: Diagnosis not present

## 2023-11-10 DIAGNOSIS — M4726 Other spondylosis with radiculopathy, lumbar region: Secondary | ICD-10-CM | POA: Insufficient documentation

## 2023-11-10 DIAGNOSIS — M5117 Intervertebral disc disorders with radiculopathy, lumbosacral region: Secondary | ICD-10-CM | POA: Diagnosis not present

## 2023-11-13 ENCOUNTER — Telehealth: Payer: Self-pay | Admitting: Nurse Practitioner

## 2023-11-13 DIAGNOSIS — G8929 Other chronic pain: Secondary | ICD-10-CM

## 2023-11-13 DIAGNOSIS — M5410 Radiculopathy, site unspecified: Secondary | ICD-10-CM

## 2023-11-14 NOTE — Progress Notes (Signed)
 She was scheduled for a telephone visit and was not available. No visit done. Rescheduled to another day.   Glade Boys PA-C Neurosurgery

## 2023-11-17 ENCOUNTER — Ambulatory Visit: Admitting: Orthopedic Surgery
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
## 2023-11-20 MED ORDER — HYDROCODONE-ACETAMINOPHEN 5-325 MG PO TABS: 1.0000 | ORAL_TABLET | Freq: Every day | ORAL | 0 refills | Status: DC | PRN

## 2023-11-20 NOTE — Telephone Encounter (Signed)
Pt notified that we send med  

## 2023-11-22 ENCOUNTER — Ambulatory Visit (INDEPENDENT_AMBULATORY_CARE_PROVIDER_SITE_OTHER): Admitting: Orthopedic Surgery

## 2023-11-22 ENCOUNTER — Encounter: Payer: Self-pay | Admitting: Orthopedic Surgery

## 2023-11-22 DIAGNOSIS — M4316 Spondylolisthesis, lumbar region: Secondary | ICD-10-CM | POA: Diagnosis not present

## 2023-11-22 DIAGNOSIS — M5416 Radiculopathy, lumbar region: Secondary | ICD-10-CM

## 2023-11-22 DIAGNOSIS — M4726 Other spondylosis with radiculopathy, lumbar region: Secondary | ICD-10-CM

## 2023-11-22 DIAGNOSIS — M47816 Spondylosis without myelopathy or radiculopathy, lumbar region: Secondary | ICD-10-CM

## 2023-11-22 DIAGNOSIS — M48062 Spinal stenosis, lumbar region with neurogenic claudication: Secondary | ICD-10-CM | POA: Diagnosis not present

## 2023-11-23 ENCOUNTER — Encounter: Payer: Self-pay | Admitting: Nurse Practitioner

## 2023-11-27 ENCOUNTER — Other Ambulatory Visit: Payer: Self-pay | Admitting: Nurse Practitioner

## 2023-11-27 DIAGNOSIS — G44209 Tension-type headache, unspecified, not intractable: Secondary | ICD-10-CM

## 2023-11-28 ENCOUNTER — Telehealth: Payer: Self-pay | Admitting: Orthopedic Surgery

## 2023-11-28 ENCOUNTER — Other Ambulatory Visit: Payer: Self-pay | Admitting: Nurse Practitioner

## 2023-11-28 DIAGNOSIS — M4316 Spondylolisthesis, lumbar region: Secondary | ICD-10-CM

## 2023-11-28 DIAGNOSIS — M47816 Spondylosis without myelopathy or radiculopathy, lumbar region: Secondary | ICD-10-CM

## 2023-11-28 DIAGNOSIS — M48062 Spinal stenosis, lumbar region with neurogenic claudication: Secondary | ICD-10-CM

## 2023-11-28 DIAGNOSIS — I739 Peripheral vascular disease, unspecified: Secondary | ICD-10-CM

## 2023-11-28 DIAGNOSIS — M5416 Radiculopathy, lumbar region: Secondary | ICD-10-CM

## 2023-11-28 DIAGNOSIS — G8929 Other chronic pain: Secondary | ICD-10-CM

## 2023-11-28 NOTE — Telephone Encounter (Signed)
-----   Message from [place] B sent at 11/28/2023 11:12 AM EDT ----- Regarding: referral Hey!  Can you place a new referral for Dr.Chasnis?

## 2023-11-28 NOTE — Telephone Encounter (Signed)
 Referral has been sent.

## 2023-12-04 ENCOUNTER — Other Ambulatory Visit: Payer: Self-pay

## 2023-12-04 ENCOUNTER — Other Ambulatory Visit: Payer: Self-pay | Admitting: Nurse Practitioner

## 2023-12-04 ENCOUNTER — Telehealth: Payer: Self-pay

## 2023-12-04 DIAGNOSIS — B009 Herpesviral infection, unspecified: Secondary | ICD-10-CM

## 2023-12-04 DIAGNOSIS — J449 Chronic obstructive pulmonary disease, unspecified: Secondary | ICD-10-CM
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
## 2023-12-05 DIAGNOSIS — M48062 Spinal stenosis, lumbar region with neurogenic claudication: Secondary | ICD-10-CM | POA: Diagnosis not present

## 2023-12-05 DIAGNOSIS — M5416 Radiculopathy, lumbar region: Secondary | ICD-10-CM | POA: Diagnosis not present

## 2023-12-05 DIAGNOSIS — M5126 Other intervertebral disc displacement, lumbar region: Secondary | ICD-10-CM | POA: Diagnosis not present

## 2023-12-05 NOTE — Telephone Encounter (Signed)
 Patient notified

## 2023-12-08 ENCOUNTER — Other Ambulatory Visit: Payer: Self-pay

## 2023-12-08 DIAGNOSIS — J449 Chronic obstructive pulmonary disease, unspecified: Secondary | ICD-10-CM

## 2023-12-08 MED ORDER — ALBUTEROL SULFATE HFA 108 (90 BASE) MCG/ACT IN AERS: 1.0000 | INHALATION_SPRAY | Freq: Four times a day (QID) | RESPIRATORY_TRACT | 5 refills | Status: AC | PRN

## 2023-12-10 ENCOUNTER — Emergency Department

## 2023-12-10 ENCOUNTER — Inpatient Hospital Stay

## 2023-12-10 ENCOUNTER — Inpatient Hospital Stay
Admission: EM | Admit: 2023-12-10 | Discharge: 2023-12-14 | DRG: 871 | Disposition: A | Discharge status: Home Health | Attending: Internal Medicine | Admitting: Internal Medicine

## 2023-12-10 ENCOUNTER — Other Ambulatory Visit: Payer: Self-pay

## 2023-12-10 DIAGNOSIS — Z7985 Long-term (current) use of injectable non-insulin antidiabetic drugs: Secondary | ICD-10-CM

## 2023-12-10 DIAGNOSIS — I517 Cardiomegaly: Secondary | ICD-10-CM | POA: Diagnosis not present

## 2023-12-10 DIAGNOSIS — J811 Chronic pulmonary edema: Secondary | ICD-10-CM | POA: Diagnosis not present

## 2023-12-10 DIAGNOSIS — E66813 Obesity, class 3: Secondary | ICD-10-CM | POA: Diagnosis present

## 2023-12-10 DIAGNOSIS — Z8249 Family history of ischemic heart disease and other diseases of the circulatory system: Secondary | ICD-10-CM

## 2023-12-10 DIAGNOSIS — F39 Unspecified mood [affective] disorder: Secondary | ICD-10-CM | POA: Diagnosis present

## 2023-12-10 DIAGNOSIS — Z1152 Encounter for screening for COVID-19: Secondary | ICD-10-CM | POA: Diagnosis not present

## 2023-12-10 DIAGNOSIS — G8929 Other chronic pain: Secondary | ICD-10-CM | POA: Diagnosis not present

## 2023-12-10 DIAGNOSIS — E872 Acidosis, unspecified: Secondary | ICD-10-CM | POA: Diagnosis present

## 2023-12-10 DIAGNOSIS — Z794 Long term (current) use of insulin: Secondary | ICD-10-CM

## 2023-12-10 DIAGNOSIS — J189 Pneumonia, unspecified organism: Secondary | ICD-10-CM | POA: Diagnosis present

## 2023-12-10 DIAGNOSIS — A419 Sepsis, unspecified organism: Secondary | ICD-10-CM | POA: Diagnosis not present

## 2023-12-10 DIAGNOSIS — K219 Gastro-esophageal reflux disease without esophagitis: Secondary | ICD-10-CM | POA: Diagnosis present

## 2023-12-10 DIAGNOSIS — R531 Weakness: Secondary | ICD-10-CM | POA: Diagnosis not present

## 2023-12-10 DIAGNOSIS — J9601 Acute respiratory failure with hypoxia: Secondary | ICD-10-CM | POA: Diagnosis present

## 2023-12-10 DIAGNOSIS — R6520 Severe sepsis without septic shock: Secondary | ICD-10-CM | POA: Diagnosis not present

## 2023-12-10 DIAGNOSIS — J90 Pleural effusion, not elsewhere classified: Secondary | ICD-10-CM | POA: Diagnosis not present

## 2023-12-10 DIAGNOSIS — E785 Hyperlipidemia, unspecified: Secondary | ICD-10-CM | POA: Diagnosis not present

## 2023-12-10 DIAGNOSIS — I130 Hypertensive heart and chronic kidney disease with heart failure and stage 1 through stage 4 chronic kidney disease, or unspecified chronic kidney disease: Secondary | ICD-10-CM | POA: Diagnosis present

## 2023-12-10 DIAGNOSIS — A4151 Sepsis due to Escherichia coli [E. coli]: Principal | ICD-10-CM | POA: Diagnosis present

## 2023-12-10 DIAGNOSIS — R9389 Abnormal findings on diagnostic imaging of other specified body structures: Secondary | ICD-10-CM | POA: Diagnosis not present

## 2023-12-10 DIAGNOSIS — I272 Pulmonary hypertension, unspecified: Secondary | ICD-10-CM | POA: Diagnosis present

## 2023-12-10 DIAGNOSIS — Z6841 Body Mass Index (BMI) 40.0 and over, adult: Secondary | ICD-10-CM | POA: Diagnosis not present

## 2023-12-10 DIAGNOSIS — J441 Chronic obstructive pulmonary disease with (acute) exacerbation: Secondary | ICD-10-CM | POA: Diagnosis present

## 2023-12-10 DIAGNOSIS — N3289 Other specified disorders of bladder: Secondary | ICD-10-CM | POA: Diagnosis not present

## 2023-12-10 DIAGNOSIS — N1832 Chronic kidney disease, stage 3b: Secondary | ICD-10-CM | POA: Diagnosis present

## 2023-12-10 DIAGNOSIS — E1165 Type 2 diabetes mellitus with hyperglycemia: Secondary | ICD-10-CM | POA: Diagnosis present

## 2023-12-10 DIAGNOSIS — E861 Hypovolemia: Secondary | ICD-10-CM | POA: Diagnosis present

## 2023-12-10 DIAGNOSIS — I5032 Chronic diastolic (congestive) heart failure: Secondary | ICD-10-CM | POA: Diagnosis present

## 2023-12-10 DIAGNOSIS — R918 Other nonspecific abnormal finding of lung field: Secondary | ICD-10-CM | POA: Diagnosis not present

## 2023-12-10 DIAGNOSIS — R6521 Severe sepsis with septic shock: Secondary | ICD-10-CM | POA: Diagnosis present

## 2023-12-10 DIAGNOSIS — Z833 Family history of diabetes mellitus: Secondary | ICD-10-CM

## 2023-12-10 DIAGNOSIS — E1169 Type 2 diabetes mellitus with other specified complication: Secondary | ICD-10-CM | POA: Diagnosis present

## 2023-12-10 DIAGNOSIS — N281 Cyst of kidney, acquired: Secondary | ICD-10-CM | POA: Diagnosis not present

## 2023-12-10 DIAGNOSIS — K529 Noninfective gastroenteritis and colitis, unspecified: Secondary | ICD-10-CM | POA: Diagnosis present

## 2023-12-10 DIAGNOSIS — E1122 Type 2 diabetes mellitus with diabetic chronic kidney disease: Secondary | ICD-10-CM | POA: Diagnosis present

## 2023-12-10 DIAGNOSIS — G43909 Migraine, unspecified, not intractable, without status migrainosus: Secondary | ICD-10-CM | POA: Diagnosis present

## 2023-12-10 DIAGNOSIS — M79604 Pain in right leg: Secondary | ICD-10-CM | POA: Diagnosis present

## 2023-12-10 DIAGNOSIS — M5410 Radiculopathy, site unspecified: Secondary | ICD-10-CM | POA: Diagnosis not present

## 2023-12-10 DIAGNOSIS — B9620 Unspecified Escherichia coli [E. coli] as the cause of diseases classified elsewhere: Secondary | ICD-10-CM | POA: Diagnosis not present

## 2023-12-10 DIAGNOSIS — Z803 Family history of malignant neoplasm of breast: Secondary | ICD-10-CM

## 2023-12-10 DIAGNOSIS — R197 Diarrhea, unspecified: Secondary | ICD-10-CM | POA: Diagnosis not present

## 2023-12-10 DIAGNOSIS — R000 Tachycardia, unspecified: Secondary | ICD-10-CM | POA: Diagnosis not present

## 2023-12-10 DIAGNOSIS — Z87891 Personal history of nicotine dependence: Secondary | ICD-10-CM

## 2023-12-10 DIAGNOSIS — I5031 Acute diastolic (congestive) heart failure: Secondary | ICD-10-CM | POA: Diagnosis not present

## 2023-12-10 DIAGNOSIS — T380X5A Adverse effect of glucocorticoids and synthetic analogues, initial encounter: Secondary | ICD-10-CM | POA: Diagnosis not present

## 2023-12-10 DIAGNOSIS — M5442 Lumbago with sciatica, left side: Secondary | ICD-10-CM | POA: Diagnosis not present

## 2023-12-10 DIAGNOSIS — M5441 Lumbago with sciatica, right side: Secondary | ICD-10-CM | POA: Diagnosis not present

## 2023-12-10 DIAGNOSIS — M069 Rheumatoid arthritis, unspecified: Secondary | ICD-10-CM | POA: Diagnosis present

## 2023-12-10 DIAGNOSIS — Z7984 Long term (current) use of oral hypoglycemic drugs: Secondary | ICD-10-CM

## 2023-12-10 DIAGNOSIS — R112 Nausea with vomiting, unspecified: Secondary | ICD-10-CM | POA: Diagnosis not present

## 2023-12-10 DIAGNOSIS — I509 Heart failure, unspecified: Secondary | ICD-10-CM | POA: Diagnosis not present

## 2023-12-10 DIAGNOSIS — K5730 Diverticulosis of large intestine without perforation or abscess without bleeding: Secondary | ICD-10-CM | POA: Diagnosis not present

## 2023-12-10 DIAGNOSIS — R079 Chest pain, unspecified: Secondary | ICD-10-CM | POA: Diagnosis not present

## 2023-12-10 DIAGNOSIS — J440 Chronic obstructive pulmonary disease with acute lower respiratory infection: Secondary | ICD-10-CM | POA: Diagnosis present

## 2023-12-10 DIAGNOSIS — Z79899 Other long term (current) drug therapy: Secondary | ICD-10-CM

## 2023-12-10 DIAGNOSIS — Z86718 Personal history of other venous thrombosis and embolism: Secondary | ICD-10-CM

## 2023-12-10 DIAGNOSIS — E871 Hypo-osmolality and hyponatremia: Secondary | ICD-10-CM | POA: Diagnosis present

## 2023-12-10 DIAGNOSIS — J984 Other disorders of lung: Secondary | ICD-10-CM | POA: Diagnosis not present

## 2023-12-10 DIAGNOSIS — I10 Essential (primary) hypertension: Secondary | ICD-10-CM | POA: Diagnosis present

## 2023-12-10 DIAGNOSIS — E1129 Type 2 diabetes mellitus with other diabetic kidney complication: Secondary | ICD-10-CM | POA: Diagnosis present

## 2023-12-10 DIAGNOSIS — G44209 Tension-type headache, unspecified, not intractable: Secondary | ICD-10-CM | POA: Diagnosis not present

## 2023-12-10 DIAGNOSIS — M79605 Pain in left leg: Secondary | ICD-10-CM | POA: Diagnosis present

## 2023-12-10 DIAGNOSIS — Z09 Encounter for follow-up examination after completed treatment for conditions other than malignant neoplasm: Secondary | ICD-10-CM | POA: Diagnosis not present

## 2023-12-10 DIAGNOSIS — J690 Pneumonitis due to inhalation of food and vomit: Secondary | ICD-10-CM | POA: Diagnosis present

## 2023-12-10 DIAGNOSIS — I700 Atherosclerosis of aorta: Secondary | ICD-10-CM | POA: Diagnosis not present

## 2023-12-10 DIAGNOSIS — R519 Headache, unspecified: Secondary | ICD-10-CM | POA: Diagnosis not present

## 2023-12-10 DIAGNOSIS — Z7982 Long term (current) use of aspirin: Secondary | ICD-10-CM

## 2023-12-10 DIAGNOSIS — Z7901 Long term (current) use of anticoagulants: Secondary | ICD-10-CM

## 2023-12-10 DIAGNOSIS — R1110 Vomiting, unspecified: Secondary | ICD-10-CM | POA: Diagnosis present

## 2023-12-10 DIAGNOSIS — Z7951 Long term (current) use of inhaled steroids: Secondary | ICD-10-CM

## 2023-12-10 DIAGNOSIS — M549 Dorsalgia, unspecified: Secondary | ICD-10-CM | POA: Diagnosis present

## 2023-12-10 LAB — LACTIC ACID, PLASMA
Lactic Acid, Venous: 1.6 mmol/L (ref 0.5–1.9)
Lactic Acid, Venous: 2.4 mmol/L (ref 0.5–1.9)
Lactic Acid, Venous: 1.3 mmol/L (ref 0.5–1.9)
Lactic Acid, Venous: 1.1 mmol/L (ref 0.5–1.9)

## 2023-12-10 LAB — RESPIRATORY PANEL BY PCR

## 2023-12-10 LAB — CBC WITH DIFFERENTIAL/PLATELET
Abs Immature Granulocytes: 0.07 K/uL (ref 0.00–0.07)
Basophils Absolute: 0 K/uL (ref 0.0–0.1)
Basophils Relative: 0 %
Eosinophils Absolute: 0 K/uL (ref 0.0–0.5)
Eosinophils Relative: 0 %
HCT: 40.4 % (ref 36.0–46.0)
Hemoglobin: 13 g/dL (ref 12.0–15.0)
Immature Granulocytes: 1 %
Lymphocytes Relative: 8 %
Lymphs Abs: 1.2 K/uL (ref 0.7–4.0)
MCH: 32.3 pg (ref 26.0–34.0)
MCHC: 32.2 g/dL (ref 30.0–36.0)
MCV: 100.2 fL — ABNORMAL HIGH (ref 80.0–100.0)
Monocytes Absolute: 1.6 K/uL — ABNORMAL HIGH (ref 0.1–1.0)
Monocytes Relative: 10 %
Neutro Abs: 12.2 K/uL — ABNORMAL HIGH (ref 1.7–7.7)
Neutrophils Relative %: 81 %
Platelets: 205 K/uL (ref 150–400)
RBC: 4.03 MIL/uL (ref 3.87–5.11)
RDW: 14.2 % (ref 11.5–15.5)
WBC: 15 K/uL — ABNORMAL HIGH (ref 4.0–10.5)
nRBC: 0 % (ref 0.0–0.2)

## 2023-12-10 LAB — TSH
TSH: 2.221 u[IU]/mL (ref 0.350–4.500)
TSH: 0.517 u[IU]/mL (ref 0.350–4.500)

## 2023-12-10 LAB — URINALYSIS, ROUTINE W REFLEX MICROSCOPIC
Bilirubin Urine: NEGATIVE
Glucose, UA: >=500 mg/dL — AB
Ketones, ur: 20 mg/dL — AB
Leukocytes,Ua: NEGATIVE
Nitrite: NEGATIVE
Protein, ur: 100 mg/dL — AB
Specific Gravity, Urine: 1.02 (ref 1.005–1.030)
pH: 5 (ref 5.0–8.0)

## 2023-12-10 LAB — COMPREHENSIVE METABOLIC PANEL WITH GFR
ALT: 48 U/L — ABNORMAL HIGH (ref 0–44)
AST: 44 U/L — ABNORMAL HIGH (ref 15–41)
Albumin: 3.2 g/dL — ABNORMAL LOW (ref 3.5–5.0)
Alkaline Phosphatase: 108 U/L (ref 38–126)
Anion gap: 15 (ref 5–15)
BUN: 17 mg/dL (ref 8–23)
CO2: 19 mmol/L — ABNORMAL LOW (ref 22–32)
Calcium: 8.9 mg/dL (ref 8.9–10.3)
Chloride: 96 mmol/L — ABNORMAL LOW (ref 98–111)
Creatinine, Ser: 1.57 mg/dL — ABNORMAL HIGH (ref 0.44–1.00)
GFR, Estimated: 37 mL/min — ABNORMAL LOW (ref >60)
Glucose, Bld: 240 mg/dL — ABNORMAL HIGH (ref 70–99)
Potassium: 3.9 mmol/L (ref 3.5–5.1)
Sodium: 130 mmol/L — ABNORMAL LOW (ref 135–145)
Total Bilirubin: 1.4 mg/dL — ABNORMAL HIGH (ref 0.0–1.2)
Total Protein: 6.9 g/dL (ref 6.5–8.1)

## 2023-12-10 LAB — STREP PNEUMONIAE URINARY ANTIGEN: Strep Pneumo Urinary Antigen: NEGATIVE

## 2023-12-10 LAB — CBG MONITORING, ED: Glucose-Capillary: 180 mg/dL — ABNORMAL HIGH (ref 70–99)

## 2023-12-10 LAB — CORTISOL: Cortisol, Plasma: 14.5 ug/dL

## 2023-12-10 LAB — GLUCOSE, CAPILLARY
Glucose-Capillary: 281 mg/dL — ABNORMAL HIGH (ref 70–99)
Glucose-Capillary: 433 mg/dL — ABNORMAL HIGH (ref 70–99)
Glucose-Capillary: 456 mg/dL — ABNORMAL HIGH (ref 70–99)
Glucose-Capillary: 409 mg/dL — ABNORMAL HIGH (ref 70–99)

## 2023-12-10 LAB — CK: Total CK: 198 U/L (ref 38–234)

## 2023-12-10 LAB — BRAIN NATRIURETIC PEPTIDE: B Natriuretic Peptide: 47.2 pg/mL (ref 0.0–100.0)

## 2023-12-10 LAB — T4, FREE: Free T4: 1.01 ng/dL (ref 0.61–1.12)

## 2023-12-10 LAB — RESP PANEL BY RT-PCR (RSV, FLU A&B, COVID)  RVPGX2
Influenza A by PCR: NEGATIVE
Influenza B by PCR: NEGATIVE
Resp Syncytial Virus by PCR: NEGATIVE
SARS Coronavirus 2 by RT PCR: NEGATIVE

## 2023-12-10 LAB — TROPONIN I (HIGH SENSITIVITY): Troponin I (High Sensitivity): 30 ng/L — ABNORMAL HIGH (ref <18)

## 2023-12-10 LAB — MRSA NEXT GEN BY PCR, NASAL: MRSA by PCR Next Gen: NOT DETECTED

## 2023-12-10 LAB — LIPASE, BLOOD: Lipase: 47 U/L (ref 11–51)

## 2023-12-10 LAB — HIV ANTIBODY (ROUTINE TESTING W REFLEX): HIV Screen 4th Generation wRfx: NONREACTIVE

## 2023-12-10 LAB — PROCALCITONIN: Procalcitonin: 2.61 ng/mL

## 2023-12-10 MED ORDER — HYDROCODONE-ACETAMINOPHEN 5-325 MG PO TABS
1.0000 | ORAL_TABLET | Freq: Every day | ORAL | Status: DC | PRN
  Administered 2023-12-10 – 2023-12-11 (×3): 1 via ORAL
  Filled 2023-12-10 (×2): qty 1

## 2023-12-10 MED ORDER — MIDODRINE HCL 5 MG PO TABS
2.5000 mg | ORAL_TABLET | Freq: Three times a day (TID) | ORAL | Status: DC
  Administered 2023-12-10 – 2023-12-11 (×6): 2.5 mg via ORAL
  Filled 2023-12-10 (×5): qty 1

## 2023-12-10 MED ORDER — AZITHROMYCIN 250 MG PO TABS
500.0000 mg | ORAL_TABLET | Freq: Every day | ORAL | Status: DC
  Administered 2023-12-11 (×2): 500 mg via ORAL
  Filled 2023-12-10: qty 2

## 2023-12-10 MED ORDER — DOCUSATE SODIUM 100 MG PO CAPS
100.0000 mg | ORAL_CAPSULE | Freq: Every day | ORAL | Status: DC
  Administered 2023-12-10 – 2023-12-11 (×3): 100 mg via ORAL
  Filled 2023-12-10 (×2): qty 1

## 2023-12-10 MED ORDER — ALLOPURINOL 100 MG PO TABS
300.0000 mg | ORAL_TABLET | Freq: Every day | ORAL | Status: DC
  Administered 2023-12-10 – 2023-12-14 (×8): 300 mg via ORAL
  Filled 2023-12-10: qty 3
  Filled 2023-12-10: qty 1
  Filled 2023-12-10: qty 3
  Filled 2023-12-10: qty 1
  Filled 2023-12-10: qty 3

## 2023-12-10 MED ORDER — METHYLPREDNISOLONE SODIUM SUCC 125 MG IJ SOLR
125.0000 mg | Freq: Once | INTRAMUSCULAR | Status: AC
  Administered 2023-12-10: 125 mg via INTRAVENOUS
  Filled 2023-12-10: qty 2

## 2023-12-10 MED ORDER — LACTATED RINGERS IV BOLUS
500.0000 mL | Freq: Once | INTRAVENOUS | Status: AC
  Administered 2023-12-10: 500 mL via INTRAVENOUS

## 2023-12-10 MED ORDER — ATORVASTATIN CALCIUM 10 MG PO TABS
10.0000 mg | ORAL_TABLET | Freq: Every day | ORAL | Status: DC
  Administered 2023-12-10 – 2023-12-14 (×8): 10 mg via ORAL
  Filled 2023-12-10 (×5): qty 1

## 2023-12-10 MED ORDER — ONDANSETRON HCL 4 MG/2ML IJ SOLN: 4.0000 mg | Freq: Four times a day (QID) | INTRAMUSCULAR | Status: DC | PRN

## 2023-12-10 MED ORDER — LORATADINE 10 MG PO TABS
10.0000 mg | ORAL_TABLET | Freq: Every day | ORAL | Status: DC
  Administered 2023-12-10 – 2023-12-14 (×8): 10 mg via ORAL
  Filled 2023-12-10 (×5): qty 1

## 2023-12-10 MED ORDER — ASPIRIN 81 MG PO TBEC
81.0000 mg | DELAYED_RELEASE_TABLET | Freq: Every day | ORAL | Status: DC
  Administered 2023-12-10 – 2023-12-14 (×8): 81 mg via ORAL
  Filled 2023-12-10 (×5): qty 1

## 2023-12-10 MED ORDER — FAMOTIDINE 20 MG PO TABS
20.0000 mg | ORAL_TABLET | Freq: Every day | ORAL | Status: DC
  Administered 2023-12-10 – 2023-12-14 (×7): 20 mg via ORAL
  Filled 2023-12-10 (×5): qty 1

## 2023-12-10 MED ORDER — BUDESONIDE 0.5 MG/2ML IN SUSP
0.5000 mg | Freq: Two times a day (BID) | RESPIRATORY_TRACT | Status: DC
  Administered 2023-12-10 – 2023-12-14 (×12): 0.5 mg via RESPIRATORY_TRACT
  Filled 2023-12-10 (×8): qty 2

## 2023-12-10 MED ORDER — ONDANSETRON HCL 4 MG PO TABS: 4.0000 mg | ORAL_TABLET | Freq: Four times a day (QID) | ORAL | Status: DC | PRN

## 2023-12-10 MED ORDER — SODIUM CHLORIDE 0.9 % IV SOLN
500.0000 mg | Freq: Once | INTRAVENOUS | Status: AC
  Administered 2023-12-10: 500 mg via INTRAVENOUS
  Filled 2023-12-10: qty 5

## 2023-12-10 MED ORDER — BUDESON-GLYCOPYRROL-FORMOTEROL 160-9-4.8 MCG/ACT IN AERO
2.0000 | INHALATION_SPRAY | Freq: Two times a day (BID) | RESPIRATORY_TRACT | Status: DC
  Administered 2023-12-10: 2 via RESPIRATORY_TRACT
  Filled 2023-12-10: qty 5.9

## 2023-12-10 MED ORDER — IOHEXOL 350 MG/ML SOLN
75.0000 mL | Freq: Once | INTRAVENOUS | Status: AC | PRN
  Administered 2023-12-10: 75 mL via INTRAVENOUS

## 2023-12-10 MED ORDER — GABAPENTIN 400 MG PO CAPS
800.0000 mg | ORAL_CAPSULE | Freq: Four times a day (QID) | ORAL | Status: DC
  Administered 2023-12-10 – 2023-12-14 (×27): 800 mg via ORAL
  Filled 2023-12-10 (×14): qty 2
  Filled 2023-12-10: qty 8
  Filled 2023-12-10 (×3): qty 2

## 2023-12-10 MED ORDER — HYDROCORTISONE SOD SUC (PF) 100 MG IJ SOLR
100.0000 mg | Freq: Two times a day (BID) | INTRAMUSCULAR | Status: DC
  Administered 2023-12-11 (×2): 100 mg via INTRAVENOUS
  Filled 2023-12-10 (×2): qty 2

## 2023-12-10 MED ORDER — ORAL CARE MOUTH RINSE: 15.0000 mL | OROMUCOSAL | Status: DC | PRN

## 2023-12-10 MED ORDER — HYDROCORTISONE SOD SUC (PF) 100 MG IJ SOLR: 100.0000 mg | Freq: Two times a day (BID) | INTRAMUSCULAR | Status: DC

## 2023-12-10 MED ORDER — INSULIN ASPART 100 UNIT/ML IJ SOLN: 0.0000 [IU] | Freq: Every day | INTRAMUSCULAR | Status: DC

## 2023-12-10 MED ORDER — PANTOPRAZOLE SODIUM 40 MG PO TBEC
40.0000 mg | DELAYED_RELEASE_TABLET | Freq: Two times a day (BID) | ORAL | Status: DC
  Administered 2023-12-10 – 2023-12-14 (×15): 40 mg via ORAL
  Filled 2023-12-10 (×9): qty 1

## 2023-12-10 MED ORDER — TOPIRAMATE 25 MG PO TABS
25.0000 mg | ORAL_TABLET | Freq: Every day | ORAL | Status: DC
  Administered 2023-12-10 – 2023-12-14 (×8): 25 mg via ORAL
  Filled 2023-12-10 (×5): qty 1

## 2023-12-10 MED ORDER — ALBUTEROL SULFATE (2.5 MG/3ML) 0.083% IN NEBU
3.0000 mL | INHALATION_SOLUTION | Freq: Four times a day (QID) | RESPIRATORY_TRACT | Status: DC | PRN
  Administered 2023-12-10: 3 mL via RESPIRATORY_TRACT
  Filled 2023-12-10: qty 3

## 2023-12-10 MED ORDER — BUPROPION HCL ER (XL) 150 MG PO TB24
300.0000 mg | ORAL_TABLET | Freq: Every day | ORAL | Status: DC
  Administered 2023-12-10 – 2023-12-14 (×8): 300 mg via ORAL
  Filled 2023-12-10 (×4): qty 2
  Filled 2023-12-10: qty 1

## 2023-12-10 MED ORDER — SODIUM CHLORIDE 0.9 % IV SOLN
2.0000 g | INTRAVENOUS | Status: DC
  Filled 2023-12-10: qty 20

## 2023-12-10 MED ORDER — PREDNISONE 20 MG PO TABS: 40.0000 mg | ORAL_TABLET | Freq: Every day | ORAL | Status: DC

## 2023-12-10 MED ORDER — INSULIN ASPART 100 UNIT/ML IJ SOLN
0.0000 [IU] | INTRAMUSCULAR | Status: DC
  Administered 2023-12-10 – 2023-12-11 (×2): 20 [IU] via SUBCUTANEOUS
  Administered 2023-12-11: 15 [IU] via SUBCUTANEOUS
  Administered 2023-12-11 (×2): 20 [IU] via SUBCUTANEOUS
  Administered 2023-12-11: 15 [IU] via SUBCUTANEOUS
  Administered 2023-12-11 (×2): 20 [IU] via SUBCUTANEOUS
  Administered 2023-12-11: 15 [IU] via SUBCUTANEOUS
  Administered 2023-12-11: 20 [IU] via SUBCUTANEOUS
  Administered 2023-12-11: 15 [IU] via SUBCUTANEOUS
  Administered 2023-12-12 (×2): 11 [IU] via SUBCUTANEOUS
  Administered 2023-12-12 (×2): 15 [IU] via SUBCUTANEOUS
  Administered 2023-12-12: 11 [IU] via SUBCUTANEOUS
  Administered 2023-12-12 (×4): 15 [IU] via SUBCUTANEOUS
  Administered 2023-12-12 (×3): 11 [IU] via SUBCUTANEOUS
  Administered 2023-12-13: 3 [IU] via SUBCUTANEOUS
  Administered 2023-12-13 (×2): 7 [IU] via SUBCUTANEOUS
  Administered 2023-12-13: 11 [IU] via SUBCUTANEOUS
  Administered 2023-12-13 (×2): 7 [IU] via SUBCUTANEOUS
  Administered 2023-12-13: 3 [IU] via SUBCUTANEOUS
  Administered 2023-12-13 (×2): 11 [IU] via SUBCUTANEOUS
  Administered 2023-12-13 (×2): 7 [IU] via SUBCUTANEOUS
  Administered 2023-12-13: 11 [IU] via SUBCUTANEOUS
  Administered 2023-12-14: 4 [IU] via SUBCUTANEOUS
  Administered 2023-12-14 (×2): 7 [IU] via SUBCUTANEOUS
  Filled 2023-12-10 (×21): qty 1

## 2023-12-10 MED ORDER — INSULIN ASPART 100 UNIT/ML IJ SOLN
0.0000 [IU] | Freq: Three times a day (TID) | INTRAMUSCULAR | Status: DC
  Administered 2023-12-10: 8 [IU] via SUBCUTANEOUS
  Administered 2023-12-10: 3 [IU] via SUBCUTANEOUS
  Filled 2023-12-10: qty 1
  Filled 2023-12-10: qty 3

## 2023-12-10 MED ORDER — ACETAMINOPHEN 650 MG RE SUPP
650.0000 mg | Freq: Four times a day (QID) | RECTAL | Status: DC | PRN
Start: 2023-12-10 — End: 2023-12-14

## 2023-12-10 MED ORDER — LACTATED RINGERS IV BOLUS
1000.0000 mL | Freq: Once | INTRAVENOUS | Status: AC
  Administered 2023-12-10: 1000 mL via INTRAVENOUS

## 2023-12-10 MED ORDER — CHLORHEXIDINE GLUCONATE CLOTH 2 % EX PADS
6.0000 | MEDICATED_PAD | Freq: Every day | CUTANEOUS | Status: DC
  Administered 2023-12-11 (×2): 6 via TOPICAL

## 2023-12-10 MED ORDER — NOREPINEPHRINE 4 MG/250ML-% IV SOLN
0.0000 ug/min | INTRAVENOUS | Status: DC
  Administered 2023-12-10: 2 ug/min via INTRAVENOUS
  Filled 2023-12-10: qty 250

## 2023-12-10 MED ORDER — ACETAMINOPHEN 325 MG PO TABS
650.0000 mg | ORAL_TABLET | Freq: Four times a day (QID) | ORAL | Status: DC | PRN
Start: 2023-12-10 — End: 2023-12-14
  Administered 2023-12-10: 650 mg via ORAL
  Filled 2023-12-10: qty 2

## 2023-12-10 MED ORDER — APIXABAN 5 MG PO TABS
5.0000 mg | ORAL_TABLET | Freq: Two times a day (BID) | ORAL | Status: DC
  Administered 2023-12-10 – 2023-12-14 (×15): 5 mg via ORAL
  Filled 2023-12-10 (×9): qty 1

## 2023-12-10 MED ORDER — SUMATRIPTAN SUCCINATE 50 MG PO TABS
50.0000 mg | ORAL_TABLET | Freq: Every day | ORAL | Status: DC | PRN
  Administered 2023-12-11 – 2023-12-13 (×8): 50 mg via ORAL
  Filled 2023-12-10 (×5): qty 1

## 2023-12-10 MED ORDER — MONTELUKAST SODIUM 10 MG PO TABS
10.0000 mg | ORAL_TABLET | Freq: Every day | ORAL | Status: DC
  Administered 2023-12-10 – 2023-12-13 (×7): 10 mg via ORAL
  Filled 2023-12-10 (×4): qty 1

## 2023-12-10 MED ORDER — LINACLOTIDE 145 MCG PO CAPS
145.0000 ug | ORAL_CAPSULE | Freq: Every day | ORAL | Status: DC
  Administered 2023-12-11 – 2023-12-14 (×7): 145 ug via ORAL
  Filled 2023-12-10 (×4): qty 1

## 2023-12-10 MED ORDER — TIZANIDINE HCL 2 MG PO TABS: 4.0000 mg | ORAL_TABLET | Freq: Three times a day (TID) | ORAL | Status: DC | PRN

## 2023-12-10 MED ORDER — IPRATROPIUM-ALBUTEROL 0.5-2.5 (3) MG/3ML IN SOLN: 3.0000 mL | RESPIRATORY_TRACT | Status: DC

## 2023-12-10 MED ORDER — IPRATROPIUM-ALBUTEROL 0.5-2.5 (3) MG/3ML IN SOLN
3.0000 mL | Freq: Four times a day (QID) | RESPIRATORY_TRACT | Status: DC
  Administered 2023-12-10 – 2023-12-11 (×4): 3 mL via RESPIRATORY_TRACT
  Filled 2023-12-10 (×4): qty 3

## 2023-12-10 MED ORDER — METOPROLOL TARTRATE 5 MG/5ML IV SOLN: 5.0000 mg | INTRAVENOUS | Status: DC | PRN

## 2023-12-10 MED ORDER — SODIUM CHLORIDE 0.9 % IV SOLN
2.0000 g | Freq: Once | INTRAVENOUS | Status: AC
  Administered 2023-12-10: 2 g via INTRAVENOUS
  Filled 2023-12-10: qty 20

## 2023-12-10 MED ORDER — ONDANSETRON HCL 4 MG/2ML IJ SOLN
4.0000 mg | Freq: Once | INTRAMUSCULAR | Status: AC
  Administered 2023-12-10: 4 mg via INTRAVENOUS
  Filled 2023-12-10: qty 2

## 2023-12-10 NOTE — ED Notes (Signed)
 Lactic 2.4  Funke informed

## 2023-12-10 NOTE — ED Notes (Signed)
 Pt eating lunch at this time. Pt informed we will do breathing treatment and other meds once pt is done eating. Pt verbalized understanding.
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
## 2023-12-10 NOTE — Progress Notes (Signed)
 Elink following for sepsis protocol.

## 2023-12-10 NOTE — ED Triage Notes (Signed)
 Reports has been having headache, vomiting and generalized body aches since Friday.  +diarrhea.  +cough.

## 2023-12-10 NOTE — ED Notes (Signed)
 Pharmacy called to send medication once ready

## 2023-12-10 NOTE — Progress Notes (Signed)
 Blood pressure has been on the lower side but supplement Cabbell and patient remain tachycardia, patient was given midodrine  2 times this afternoon.  Will check TSH and cortisol level and start patient on hydrocortisone  every 6 hours.

## 2023-12-10 NOTE — Plan of Care (Signed)
  Problem: Metabolic: Goal: Ability to maintain appropriate glucose levels will improve Outcome: Progressing   Problem: Education: Goal: Knowledge of General Education information will improve Description: Including pain rating scale, medication(s)/side effects and non-pharmacologic comfort measures Outcome: Progressing   Problem: Clinical Measurements: Goal: Respiratory complications will improve Outcome: Progressing   Problem: Pain Managment: Goal: General experience of comfort will improve and/or be controlled Outcome: Progressing

## 2023-12-10 NOTE — ED Notes (Signed)
 Pt was not able to obtain urine earlier and needed hat placed in toilet. Pt up to toilet again to try and obtain sample.

## 2023-12-10 NOTE — Progress Notes (Signed)
 CODE SEPSIS - PHARMACY COMMUNICATION  **Broad Spectrum Antibiotics should be administered within 1 hour of Sepsis diagnosis**  Time Code Sepsis Called/Page Received: 1021  Antibiotics Ordered: Ceftriaxone  & Z-max  Time of 1st antibiotic administration: 1039  Additional action taken by pharmacy: N/A  Marolyn KATHEE Mare 12/10/2023  10:26 AM

## 2023-12-10 NOTE — ED Notes (Signed)
 Pt assisted up to toilet. Pt provided specimen cup for urine collection.

## 2023-12-10 NOTE — ED Notes (Signed)
 Advised nurse that patient has ready bed
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
## 2023-12-10 NOTE — ED Provider Notes (Addendum)
 [facility] Provider Note    Event Date/Time   First MD Initiated Contact with Patient 12/10/23 0830     (approximate)   History   Emesis   HPI  Brenda Santana is a 63 y.o. female with diabetes on insulin  who comes in with concern for not feeling well for the past 3 days.  Patient reports feeling like she has a virus.  She reports generalized body aches, diarrhea, vomiting, cough.  She denies being around anybody who has been sick but states that she has been around a daycare children.  She denies any abdominal pain.  Reports not really taking her insulin  secondary to just not feeling well.  She denies any falls or hitting her head, chest pain, shortness of breath.   Physical Exam   Triage Vital Signs: ED Triage Vitals  Encounter Vitals Group     BP 12/10/23 0819 (!) 111/50     Girls Systolic BP Percentile --      Girls Diastolic BP Percentile --      Boys Systolic BP Percentile --      Boys Diastolic BP Percentile --      Pulse Rate 12/10/23 0819 (!) 125     Resp 12/10/23 0819 18     Temp 12/10/23 0819 98.9 F (37.2 C)     Temp Source 12/10/23 0819 Oral     SpO2 12/10/23 0819 92 %     Weight 12/10/23 0818 258 lb (117 kg)     Height 12/10/23 0818 5' 5 (1.651 m)     Head Circumference --      Peak Flow --      Pain Score --      Pain Loc --      Pain Education --      Exclude from Growth Chart --     Most recent vital signs: Vitals:   12/10/23 0819  BP: (!) 111/50  Pulse: (!) 125  Resp: 18  Temp: 98.9 F (37.2 C)  SpO2: 92%     General: Awake, no distress.  CV:  Good peripheral perfusion.  Tachycardic Resp:  Normal effort.  Abd:  No distention.  Soft and nontender Other:  No swelling in legs.  No calf tenderness CN Nerves appear intact.  Equal strength in arms and legs  ED Results / Procedures / Treatments   Labs (all labs ordered are listed, but only abnormal results are displayed) Labs Reviewed  CBC WITH  DIFFERENTIAL/PLATELET - Abnormal; Notable for the following components:      Result Value   WBC 15.0 (*)    MCV 100.2 (*)    Neutro Abs 12.2 (*)    Monocytes Absolute 1.6 (*)    All other components within normal limits  RESP PANEL BY RT-PCR (RSV, FLU A&B, COVID)  [id number]  LACTIC ACID, PLASMA  LIPASE, BLOOD  COMPREHENSIVE METABOLIC PANEL WITH GFR  URINALYSIS, ROUTINE W REFLEX MICROSCOPIC  CK  LACTIC ACID, PLASMA  TROPONIN I (HIGH SENSITIVITY)     EKG  My interpretation of EKG:  Tachycardia rate of 123 without any ST elevation or T wave inversions, QTc of 513  RADIOLOGY I have reviewed the xray personally and interpreted mild cardiomegaly   PROCEDURES:  Critical Care performed: No  .1-3 Lead EKG Interpretation  Performed by: Ernest Ronal BRAVO, MD Authorized by: Ernest Ronal BRAVO, MD     Interpretation: abnormal     ECG rate:  120   ECG rate assessment:  tachycardic     Rhythm: sinus tachycardia     Ectopy: none     Conduction: normal      MEDICATIONS ORDERED IN ED: Medications  lactated ringers  bolus 1,000 mL (1,000 mLs Intravenous New Bag/Given 12/10/23 0839)  ondansetron  (ZOFRAN ) injection 4 mg (4 mg Intravenous Given 12/10/23 0845)     IMPRESSION / MDM / ASSESSMENT AND PLAN / ED COURSE  I reviewed the triage vital signs and the nursing notes.   Patient's presentation is most consistent with acute presentation with potential threat to life or bodily function.   Patient comes in with nausea vomiting diarrhea headache cough sounds of viral in nature but patient is hypotensive in the 80s and tachycardic so patient was given some IV fluids, Zofran .  Patient's differential includes viral illness versus UTI versus pneumonia versus myocarditis.  She denies any shortness of breath doubt PE.  Considered this as well but patient is on Eliquis  and reports being compliant with it.  Given her headache and on Eliquis  she denies any falls or hitting her head reports that this is a  typical headache for her when she is sick and there are no cranial nerve deficits.  Lactate is normal.  CBC does show elevated white count with left shift.  Troponin is slightly elevated  CK normal COVID, flu negative  COVID, flu are negative.  Will add on blood cultures given white count is elevated she meets sepsis criteria still unclear source so we will hold off on starting antibiotics.  X-ray with possible cardiomegaly, pulmonary edema.  Did review prior x-ray where she has had cardiomegaly before and she does report history of CHF.  She is tolerated the fluids well blood pressures have come up but remains tachycardic holding off on additional fluids given concern for pulmonary edema.  She really denies any worsening shortness of breath.   Patient does report history of asthma I will give a dose of steroids to help with I do not hear any obvious wheezing.  COVID and flu are negative I will add on the rest of the viral panel.  I will cover patient for pneumonia.  Given continued tachycardia I recommend admission to the hospital for continued monitoring.  The patient is on the cardiac monitor to evaluate for evidence of arrhythmia and/or significant heart rate changes.      FINAL CLINICAL IMPRESSION(S) / ED DIAGNOSES   Final diagnoses:  Sepsis, due to unspecified organism, unspecified whether acute organ dysfunction present (HCC)  Nausea vomiting and diarrhea     Rx / DC Orders   ED Discharge Orders     None        Note:  This document was prepared using Dragon voice recognition software and may include unintentional dictation errors.   Ernest Ronal BRAVO, MD 12/10/23 1049    Ernest Ronal BRAVO, MD 12/10/23 1050

## 2023-12-11 ENCOUNTER — Inpatient Hospital Stay

## 2023-12-11 ENCOUNTER — Inpatient Hospital Stay (HOSPITAL_COMMUNITY)
Admit: 2023-12-11 | Discharge: 2023-12-11 | Disposition: A | Discharge status: Home / Self Care | Attending: Internal Medicine

## 2023-12-11 DIAGNOSIS — J9601 Acute respiratory failure with hypoxia: Secondary | ICD-10-CM | POA: Diagnosis present

## 2023-12-11 DIAGNOSIS — I5031 Acute diastolic (congestive) heart failure: Secondary | ICD-10-CM

## 2023-12-11 DIAGNOSIS — J189 Pneumonia, unspecified organism: Secondary | ICD-10-CM | POA: Diagnosis not present

## 2023-12-11 DIAGNOSIS — A4151 Sepsis due to Escherichia coli [E. coli]: Secondary | ICD-10-CM | POA: Diagnosis present

## 2023-12-11 DIAGNOSIS — A419 Sepsis, unspecified organism: Secondary | ICD-10-CM | POA: Diagnosis not present

## 2023-12-11 DIAGNOSIS — N1832 Chronic kidney disease, stage 3b: Secondary | ICD-10-CM | POA: Diagnosis present

## 2023-12-11 DIAGNOSIS — R6520 Severe sepsis without septic shock: Secondary | ICD-10-CM

## 2023-12-11 LAB — GLUCOSE, CAPILLARY
Glucose-Capillary: 307 mg/dL — ABNORMAL HIGH (ref 70–99)
Glucose-Capillary: 327 mg/dL — ABNORMAL HIGH (ref 70–99)
Glucose-Capillary: 403 mg/dL — ABNORMAL HIGH (ref 70–99)
Glucose-Capillary: 378 mg/dL — ABNORMAL HIGH (ref 70–99)
Glucose-Capillary: 358 mg/dL — ABNORMAL HIGH (ref 70–99)

## 2023-12-11 LAB — URINALYSIS, COMPLETE (UACMP) WITH MICROSCOPIC
Bacteria, UA: NONE SEEN
Bilirubin Urine: NEGATIVE
Glucose, UA: >=500 mg/dL — AB
Ketones, ur: 5 mg/dL — AB
Leukocytes,Ua: NEGATIVE
Nitrite: NEGATIVE
Protein, ur: NEGATIVE mg/dL
Specific Gravity, Urine: 1.029 (ref 1.005–1.030)
pH: 5 (ref 5.0–8.0)

## 2023-12-11 LAB — CBC
HCT: 41 % (ref 36.0–46.0)
Hemoglobin: 12.6 g/dL (ref 12.0–15.0)
MCH: 31.5 pg (ref 26.0–34.0)
MCHC: 30.7 g/dL (ref 30.0–36.0)
MCV: 102.5 fL — ABNORMAL HIGH (ref 80.0–100.0)
Platelets: 213 K/uL (ref 150–400)
RBC: 4 MIL/uL (ref 3.87–5.11)
RDW: 14.2 % (ref 11.5–15.5)
WBC: 8.4 K/uL (ref 4.0–10.5)
nRBC: 0.4 % — ABNORMAL HIGH (ref 0.0–0.2)

## 2023-12-11 LAB — BLOOD CULTURE ID PANEL (REFLEXED) - BCID2

## 2023-12-11 LAB — ECHOCARDIOGRAM COMPLETE
AR max vel: 1.84 cm2
AV Peak grad: 16.2 mmHg
Ao pk vel: 2.01 m/s
Area-P 1/2: 3.5 cm2
Height: 65 in
S' Lateral: 2.9 cm
Weight: 3918.9 [oz_av]

## 2023-12-11 LAB — BASIC METABOLIC PANEL WITH GFR
Anion gap: 12 (ref 5–15)
BUN: 25 mg/dL — ABNORMAL HIGH (ref 8–23)
CO2: 18 mmol/L — ABNORMAL LOW (ref 22–32)
Calcium: 9 mg/dL (ref 8.9–10.3)
Chloride: 102 mmol/L (ref 98–111)
Creatinine, Ser: 1.48 mg/dL — ABNORMAL HIGH (ref 0.44–1.00)
GFR, Estimated: 40 mL/min — ABNORMAL LOW (ref >60)
Glucose, Bld: 355 mg/dL — ABNORMAL HIGH (ref 70–99)
Potassium: 4.7 mmol/L (ref 3.5–5.1)
Sodium: 132 mmol/L — ABNORMAL LOW (ref 135–145)

## 2023-12-11 LAB — C-REACTIVE PROTEIN: CRP: 20.8 mg/dL — ABNORMAL HIGH (ref <1.0)

## 2023-12-11 LAB — MYCOPLASMA PNEUMONIAE ANTIBODY, IGM: Mycoplasma pneumo IgM: <770 U/mL (ref 0–769)

## 2023-12-11 MED ORDER — SODIUM CHLORIDE 0.9 % IV SOLN
2.0000 g | INTRAVENOUS | Status: DC
  Administered 2023-12-11 – 2023-12-14 (×7): 2 g via INTRAVENOUS
  Filled 2023-12-11 (×4): qty 20

## 2023-12-11 MED ORDER — SODIUM BICARBONATE 650 MG PO TABS
1300.0000 mg | ORAL_TABLET | Freq: Two times a day (BID) | ORAL | Status: DC
  Administered 2023-12-11 – 2023-12-12 (×6): 1300 mg via ORAL
  Filled 2023-12-11 (×3): qty 2

## 2023-12-11 MED ORDER — UMECLIDINIUM BROMIDE 62.5 MCG/ACT IN AEPB
1.0000 | INHALATION_SPRAY | Freq: Every day | RESPIRATORY_TRACT | Status: DC
  Administered 2023-12-11 – 2023-12-14 (×7): 1 via RESPIRATORY_TRACT
  Filled 2023-12-11: qty 7

## 2023-12-11 MED ORDER — INSULIN GLARGINE-YFGN 100 UNIT/ML ~~LOC~~ SOLN
18.0000 [IU] | Freq: Two times a day (BID) | SUBCUTANEOUS | Status: DC
  Administered 2023-12-11 (×2): 18 [IU] via SUBCUTANEOUS
  Filled 2023-12-11 (×2): qty 0.18

## 2023-12-11 MED ORDER — IPRATROPIUM-ALBUTEROL 0.5-2.5 (3) MG/3ML IN SOLN
3.0000 mL | Freq: Two times a day (BID) | RESPIRATORY_TRACT | Status: DC
  Administered 2023-12-12 – 2023-12-14 (×9): 3 mL via RESPIRATORY_TRACT
  Filled 2023-12-11 (×6): qty 3

## 2023-12-11 MED ORDER — SENNOSIDES-DOCUSATE SODIUM 8.6-50 MG PO TABS
2.0000 | ORAL_TABLET | Freq: Two times a day (BID) | ORAL | Status: DC
  Administered 2023-12-11 – 2023-12-14 (×11): 2 via ORAL
  Filled 2023-12-11 (×6): qty 2

## 2023-12-11 MED ORDER — INSULIN GLARGINE-YFGN 100 UNIT/ML ~~LOC~~ SOLN
10.0000 [IU] | Freq: Two times a day (BID) | SUBCUTANEOUS | Status: DC
  Administered 2023-12-11 (×2): 10 [IU] via SUBCUTANEOUS
  Filled 2023-12-11: qty 0.1

## 2023-12-11 MED ORDER — IPRATROPIUM-ALBUTEROL 0.5-2.5 (3) MG/3ML IN SOLN
3.0000 mL | Freq: Three times a day (TID) | RESPIRATORY_TRACT | Status: DC
  Filled 2023-12-11: qty 3

## 2023-12-11 MED ORDER — FLUTICASONE FUROATE-VILANTEROL 100-25 MCG/ACT IN AEPB
1.0000 | INHALATION_SPRAY | Freq: Every day | RESPIRATORY_TRACT | Status: DC
  Administered 2023-12-11 – 2023-12-14 (×7): 1 via RESPIRATORY_TRACT
  Filled 2023-12-11: qty 28

## 2023-12-11 NOTE — Progress Notes (Signed)
 PHARMACY - PHYSICIAN COMMUNICATION CRITICAL VALUE ALERT - BLOOD CULTURE IDENTIFICATION (BCID)  Results for orders placed or performed during the hospital encounter of 12/10/23  Resp panel by RT-PCR (RSV, Flu A&B, Covid) Anterior Nasal Swab     Status: None   Collection Time: 12/10/23  8:20 AM   Specimen: Anterior Nasal Swab  Result Value Ref Range Status   SARS Coronavirus 2 by RT PCR NEGATIVE NEGATIVE Final    Comment: (NOTE) SARS-CoV-2 target nucleic acids are NOT DETECTED.  The SARS-CoV-2 RNA is generally detectable in upper respiratory specimens during the acute phase of infection. The lowest concentration of SARS-CoV-2 viral copies this assay can detect is 138 copies/mL. A negative result does not preclude SARS-Cov-2 infection and should not be used as the sole basis for treatment or other patient management decisions. A negative result may occur with  improper specimen collection/handling, submission of specimen other than nasopharyngeal swab, presence of viral mutation(s) within the areas targeted by this assay, and inadequate number of viral copies(<138 copies/mL). A negative result must be combined with clinical observations, patient history, and epidemiological information. The expected result is Negative.  Fact Sheet for Patients:  BloggerCourse.com  Fact Sheet for Healthcare Providers:  SeriousBroker.it  This test is no t yet approved or cleared by the [place]  FDA and  has been authorized for detection and/or diagnosis of SARS-CoV-2 by FDA under an Emergency Use Authorization (EUA). This EUA will remain  in effect (meaning this test can be used) for the duration of the COVID-19 declaration under Section 564(b)(1) of the Act, 21 U.S.C.section 360bbb-3(b)(1), unless the authorization is terminated  or revoked sooner.       Influenza A by PCR NEGATIVE NEGATIVE Final   Influenza B by PCR NEGATIVE NEGATIVE Final     Comment: (NOTE) The Xpert Xpress SARS-CoV-2/FLU/RSV plus assay is intended as an aid in the diagnosis of influenza from Nasopharyngeal swab specimens and should not be used as a sole basis for treatment. Nasal washings and aspirates are unacceptable for Xpert Xpress SARS-CoV-2/FLU/RSV testing.  Fact Sheet for Patients: BloggerCourse.com  Fact Sheet for Healthcare Providers: SeriousBroker.it  This test is not yet approved or cleared by the [place]  FDA and has been authorized for detection and/or diagnosis of SARS-CoV-2 by FDA under an Emergency Use Authorization (EUA). This EUA will remain in effect (meaning this test can be used) for the duration of the COVID-19 declaration under Section 564(b)(1) of the Act, 21 U.S.C. section 360bbb-3(b)(1), unless the authorization is terminated or revoked.     Resp Syncytial Virus by PCR NEGATIVE NEGATIVE Final    Comment: (NOTE) Fact Sheet for Patients: BloggerCourse.com  Fact Sheet for Healthcare Providers: SeriousBroker.it  This test is not yet approved or cleared by the [place]  FDA and has been authorized for detection and/or diagnosis of SARS-CoV-2 by FDA under an Emergency Use Authorization (EUA). This EUA will remain in effect (meaning this test can be used) for the duration of the COVID-19 declaration under Section 564(b)(1) of the Act, 21 U.S.C. section 360bbb-3(b)(1), unless the authorization is terminated or revoked.  Performed at [facility], [address] [place], [place] [address]   Respiratory (~20 pathogens) panel by PCR     Status: None   Collection Time: 12/10/23  8:20 AM   Specimen: Nasopharyngeal Swab; Respiratory  Result Value Ref Range Status   Adenovirus NOT DETECTED NOT DETECTED Final   Coronavirus 229E NOT DETECTED NOT DETECTED Final    Comment: (NOTE) The  Coronavirus on the  Respiratory Panel, DOES NOT test for the novel  Coronavirus (2019 nCoV)    Coronavirus HKU1 NOT DETECTED NOT DETECTED Final   Coronavirus NL63 NOT DETECTED NOT DETECTED Final   Coronavirus OC43 NOT DETECTED NOT DETECTED Final   Metapneumovirus NOT DETECTED NOT DETECTED Final   Rhinovirus / Enterovirus NOT DETECTED NOT DETECTED Final   Influenza A NOT DETECTED NOT DETECTED Final   Influenza B NOT DETECTED NOT DETECTED Final   Parainfluenza Virus 1 NOT DETECTED NOT DETECTED Final   Parainfluenza Virus 2 NOT DETECTED NOT DETECTED Final   Parainfluenza Virus 3 NOT DETECTED NOT DETECTED Final   Parainfluenza Virus 4 NOT DETECTED NOT DETECTED Final   Respiratory Syncytial Virus NOT DETECTED NOT DETECTED Final   Bordetella pertussis NOT DETECTED NOT DETECTED Final   Bordetella Parapertussis NOT DETECTED NOT DETECTED Final   Chlamydophila pneumoniae NOT DETECTED NOT DETECTED Final   Mycoplasma pneumoniae NOT DETECTED NOT DETECTED Final    Comment: Performed at [facility], [address] [place], [place] [address]  Blood culture (routine x 2)     Status: None (Preliminary result)   Collection Time: 12/10/23  9:16 AM   Specimen: BLOOD LEFT ARM  Result Value Ref Range Status   Specimen Description BLOOD LEFT ARM  Final   Special Requests   Final    BOTTLES DRAWN AEROBIC AND ANAEROBIC Blood Culture adequate volume   Culture  Setup Time   Final    Organism ID to follow GRAM NEGATIVE RODS AEROBIC BOTTLE ONLY CRITICAL RESULT CALLED TO, READ BACK BY AND VERIFIED WITH: Kacin Dancy BELEUE @0241  ON 12/11/23 SKL Performed at [facility], [address], [place], [place] [address]    Culture PENDING  Incomplete   Report Status PENDING  Incomplete  Blood Culture ID Panel (Reflexed)     Status: Abnormal   Collection Time: 12/10/23  9:16 AM  Result Value Ref Range Status   Enterococcus faecalis NOT DETECTED NOT DETECTED Final   Enterococcus Faecium NOT DETECTED NOT DETECTED Final    Listeria monocytogenes NOT DETECTED NOT DETECTED Final   Staphylococcus species NOT DETECTED NOT DETECTED Final   Staphylococcus aureus (BCID) NOT DETECTED NOT DETECTED Final   Staphylococcus epidermidis NOT DETECTED NOT DETECTED Final   Staphylococcus lugdunensis NOT DETECTED NOT DETECTED Final   Streptococcus species NOT DETECTED NOT DETECTED Final   Streptococcus agalactiae NOT DETECTED NOT DETECTED Final   Streptococcus pneumoniae NOT DETECTED NOT DETECTED Final   Streptococcus pyogenes NOT DETECTED NOT DETECTED Final   A.calcoaceticus-baumannii NOT DETECTED NOT DETECTED Final   Bacteroides fragilis NOT DETECTED NOT DETECTED Final   Enterobacterales DETECTED (A) NOT DETECTED Final    Comment: Enterobacterales represent a large order of gram negative bacteria, not a single organism. CRITICAL RESULT CALLED TO, READ BACK BY AND VERIFIED WITH: Saretta Dahlem BELEUE @0241  ON 12/11/23 SKL    Enterobacter cloacae complex NOT DETECTED NOT DETECTED Final   Escherichia coli DETECTED (A) NOT DETECTED Final    Comment: CRITICAL RESULT CALLED TO, READ BACK BY AND VERIFIED WITH: Mikaylee Arseneau BELEUE @0241  ON 12/11/23 SKL    Klebsiella aerogenes NOT DETECTED NOT DETECTED Final   Klebsiella oxytoca NOT DETECTED NOT DETECTED Final   Klebsiella pneumoniae NOT DETECTED NOT DETECTED Final   Proteus species NOT DETECTED NOT DETECTED Final   Salmonella species NOT DETECTED NOT DETECTED Final   Serratia marcescens NOT DETECTED NOT DETECTED Final   Haemophilus influenzae NOT DETECTED NOT DETECTED Final  Neisseria meningitidis NOT DETECTED NOT DETECTED Final   Pseudomonas aeruginosa NOT DETECTED NOT DETECTED Final   Stenotrophomonas maltophilia NOT DETECTED NOT DETECTED Final   Candida albicans NOT DETECTED NOT DETECTED Final   Candida auris NOT DETECTED NOT DETECTED Final   Candida glabrata NOT DETECTED NOT DETECTED Final   Candida krusei NOT DETECTED NOT DETECTED Final   Candida parapsilosis NOT DETECTED NOT  DETECTED Final   Candida tropicalis NOT DETECTED NOT DETECTED Final   Cryptococcus neoformans/gattii NOT DETECTED NOT DETECTED Final   CTX-M ESBL NOT DETECTED NOT DETECTED Final   Carbapenem resistance IMP NOT DETECTED NOT DETECTED Final   Carbapenem resistance KPC NOT DETECTED NOT DETECTED Final   Carbapenem resistance NDM NOT DETECTED NOT DETECTED Final   Carbapenem resist OXA 48 LIKE NOT DETECTED NOT DETECTED Final   Carbapenem resistance VIM NOT DETECTED NOT DETECTED Final    Comment: Performed at [facility], [address] [place], [place] [address]  MRSA Next Gen by PCR, Nasal     Status: None   Collection Time: 12/10/23  4:17 PM   Specimen: Nasal Mucosa; Nasal Swab  Result Value Ref Range Status   MRSA by PCR Next Gen NOT DETECTED NOT DETECTED Final    Comment: (NOTE) The GeneXpert MRSA Assay (FDA approved for NASAL specimens only), is one component of a comprehensive MRSA colonization surveillance program. It is not intended to diagnose MRSA infection nor to guide or monitor treatment for MRSA infections. Test performance is not FDA approved in patients less than 4 years old. Performed at [facility], [address] [place], [place] [address]     BCID Results: 1 (aerobic) of 4 bottles w/ E Coli, no resistance.  Pt already on Ceftriaxone  2 gm q24h.  Name of provider contacted: CHARLENA Nose, NP   Changes to prescribed antibiotics required: No changes needed at this time.  Rankin CANDIE Dills, PharmD, [facility] 12/11/2023 2:37 AM

## 2023-12-11 NOTE — Progress Notes (Signed)
 Patients blood sugar was 403 at lunch time CBG. Sliding scale orders to notify MD if its over 400. Made MD aware, Dr. Laurita stated to you can give the required dose for CBG 400 and he stated that he discontinued her steroids.

## 2023-12-11 NOTE — Plan of Care (Signed)
  Problem: Tissue Perfusion: Goal: Adequacy of tissue perfusion will improve Outcome: Progressing   Problem: Education: Goal: Knowledge of General Education information will improve Description: Including pain rating scale, medication(s)/side effects and non-pharmacologic comfort measures Outcome: Progressing   Problem: Clinical Measurements: Goal: Ability to maintain clinical measurements within normal limits will improve Outcome: Progressing Goal: Diagnostic test results will improve Outcome: Progressing

## 2023-12-11 NOTE — Progress Notes (Signed)
 Patient was positive for ecoli in blood cultures. Contacted infection prevention and spoke to [place]. She said that patient does not need to be placed on any precautions at the time.
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
## 2023-12-11 NOTE — Inpatient Diabetes Management (Signed)
 Inpatient Diabetes Program Recommendations  AACE/ADA: New Consensus Statement on Inpatient Glycemic Control (2015)  Target Ranges:  Prepandial:   less than 140 mg/dL      Peak postprandial:   less than 180 mg/dL (1-2 hours)      Critically ill patients:  140 - 180 mg/dL   Lab Results  Component Value Date   GLUCAP 327 (H) 12/11/2023   HGBA1C 9.8 (A) 10/02/2023    Latest Reference Range & Units 12/10/23 12:36 12/10/23 16:10 12/10/23 22:00 12/10/23 22:05 12/10/23 22:57 12/11/23 04:13 12/11/23 07:12  Glucose-Capillary 70 - 99 mg/dL 819 (H) 718 (H) 543 (H) 433 (H) 409 (H) 307 (H) 327 (H)  (H): Data is abnormally high  Diabetes history: DM2 Outpatient Diabetes medications: Lantus  6-12 units nightly Farxiga  10 mg daily Amaryl  1 mg daily Mounjaro  12.5 mg weekly Current orders for Inpatient glycemic control: Semglee  10 units bid Novolog  0-20 units q 4 hrs.  Solumedrol 125 mg x 1 12/10/23 @ 1041 Solucortef 100 mg x 1 12/11/23 @ 0923  Inpatient Diabetes Program Recommendations:   No further steroids ordered. Please consider when CBGs decreasing: -Decrease Novolog  correction to 0-9 units q 4 hrs. Then tid, 0-5 units hs -Adjust Semglee   Thank you, Maxfield Gildersleeve E. Alisabeth Selkirk, RN, MSN, CDCES  Diabetes Coordinator Inpatient Glycemic Control Team Team Pager [phone] (8am-5pm) 12/11/2023 10:52 AM

## 2023-12-11 NOTE — Hospital Course (Addendum)
 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  Noted to have hypoxia to 88% on RA, no longer requiring [place] O2.  Diagnosed with severe sepsis due to BLL PNA, treated with ceftriaxone  and azithromycin . Blood cultures positive for E. coli.

## 2023-12-11 NOTE — Progress Notes (Signed)
 Echocardiogram 2D Echocardiogram has been performed.  Brenda Santana 12/11/2023, 12:29 PM

## 2023-12-12 DIAGNOSIS — A4151 Sepsis due to Escherichia coli [E. coli]: Secondary | ICD-10-CM | POA: Diagnosis not present

## 2023-12-12 DIAGNOSIS — J9601 Acute respiratory failure with hypoxia: Secondary | ICD-10-CM | POA: Diagnosis not present

## 2023-12-12 DIAGNOSIS — J189 Pneumonia, unspecified organism: Secondary | ICD-10-CM | POA: Diagnosis not present

## 2023-12-12 DIAGNOSIS — A419 Sepsis, unspecified organism: Secondary | ICD-10-CM | POA: Diagnosis not present

## 2023-12-12 LAB — BASIC METABOLIC PANEL WITH GFR
Anion gap: 12 (ref 5–15)
BUN: 31 mg/dL — ABNORMAL HIGH (ref 8–23)
CO2: 25 mmol/L (ref 22–32)
Calcium: 9.5 mg/dL (ref 8.9–10.3)
Chloride: 98 mmol/L (ref 98–111)
Creatinine, Ser: 1.23 mg/dL — ABNORMAL HIGH (ref 0.44–1.00)
GFR, Estimated: 49 mL/min — ABNORMAL LOW (ref >60)
Glucose, Bld: 284 mg/dL — ABNORMAL HIGH (ref 70–99)
Potassium: 4.4 mmol/L (ref 3.5–5.1)
Sodium: 135 mmol/L (ref 135–145)

## 2023-12-12 LAB — CBC
HCT: 36.1 % (ref 36.0–46.0)
Hemoglobin: 11.9 g/dL — ABNORMAL LOW (ref 12.0–15.0)
MCH: 32.3 pg (ref 26.0–34.0)
MCHC: 33 g/dL (ref 30.0–36.0)
MCV: 98.1 fL (ref 80.0–100.0)
Platelets: 224 K/uL (ref 150–400)
RBC: 3.68 MIL/uL — ABNORMAL LOW (ref 3.87–5.11)
RDW: 14.1 % (ref 11.5–15.5)
WBC: 16.1 K/uL — ABNORMAL HIGH (ref 4.0–10.5)
nRBC: 0.4 % — ABNORMAL HIGH (ref 0.0–0.2)

## 2023-12-12 LAB — GLUCOSE, CAPILLARY
Glucose-Capillary: 252 mg/dL — ABNORMAL HIGH (ref 70–99)
Glucose-Capillary: 254 mg/dL — ABNORMAL HIGH (ref 70–99)
Glucose-Capillary: 288 mg/dL — ABNORMAL HIGH (ref 70–99)
Glucose-Capillary: 310 mg/dL — ABNORMAL HIGH (ref 70–99)
Glucose-Capillary: 333 mg/dL — ABNORMAL HIGH (ref 70–99)
Glucose-Capillary: 348 mg/dL — ABNORMAL HIGH (ref 70–99)

## 2023-12-12 LAB — LEGIONELLA PNEUMOPHILA SEROGP 1 UR AG: L. pneumophila Serogp 1 Ur Ag: NEGATIVE

## 2023-12-12 LAB — URINE CULTURE: Culture: <10000 — AB

## 2023-12-12 LAB — MAGNESIUM: Magnesium: 2.7 mg/dL — ABNORMAL HIGH (ref 1.7–2.4)

## 2023-12-12 MED ORDER — METOPROLOL TARTRATE 50 MG PO TABS
50.0000 mg | ORAL_TABLET | Freq: Two times a day (BID) | ORAL | Status: DC
  Administered 2023-12-12 – 2023-12-14 (×9): 50 mg via ORAL
  Filled 2023-12-12 (×5): qty 1

## 2023-12-12 MED ORDER — INSULIN GLARGINE-YFGN 100 UNIT/ML ~~LOC~~ SOLN
24.0000 [IU] | Freq: Two times a day (BID) | SUBCUTANEOUS | Status: DC
  Administered 2023-12-12 – 2023-12-14 (×9): 24 [IU] via SUBCUTANEOUS
  Filled 2023-12-12 (×6): qty 0.24

## 2023-12-12 NOTE — Plan of Care (Signed)
  Problem: Education: Goal: Ability to describe self-care measures that may prevent or decrease complications (Diabetes Survival Skills Education) will improve Outcome: Progressing   Problem: Coping: Goal: Ability to adjust to condition or change in health will improve Outcome: Progressing   Problem: Fluid Volume: Goal: Ability to maintain a balanced intake and output will improve Outcome: Progressing   Problem: Health Behavior/Discharge Planning: Goal: Ability to identify and utilize available resources and services will improve Outcome: Progressing Goal: Ability to manage health-related needs will improve Outcome: Progressing   Problem: Metabolic: Goal: Ability to maintain appropriate glucose levels will improve Outcome: Progressing   Problem: Nutritional: Goal: Maintenance of adequate nutrition will improve Outcome: Progressing Goal: Progress toward achieving an optimal weight will improve Outcome: Progressing   
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
## 2023-12-12 NOTE — TOC Initial Note (Signed)
 Transition of Care [facility]) - Initial/Assessment Note    Patient Details  Name: Brenda Santana MRN: [id number] Date of Birth: [birth year]  Transition of Care [facility]) CM/SW Contact:    Lauraine JAYSON Carpen, LCSW Phone Number: 12/12/2023, 11:03 AM  Clinical Narrative:  Readmission prevention screen complete. CSW met with patient. No family at bedside. CSW introduced role and explained that discharge planning would be discussed. PCP is Mardy Maxin, NP. Patient drives herself to appointments. She uses Location [facility] [place], and [employer]. No issues obtaining medications. Patient lives home with her daughter. No home health or DME use prior to admission. No further concerns. CSW will continue to follow patient for support and facilitate return home once stable. Her son will transport her home at discharge.                Expected Discharge Plan: Home/Self Care Barriers to Discharge: Continued Medical Work up   Patient Goals and CMS Choice            Expected Discharge Plan and Services     Post Acute Care Choice: NA Living arrangements for the past 2 months: Apartment                                      Prior Living Arrangements/Services Living arrangements for the past 2 months: Apartment Lives with:: Adult Children Patient language and need for interpreter reviewed:: Yes Do you feel safe going back to the place where you live?: Yes      Need for Family Participation in Patient Care: Yes (Comment) Care giver support system in place?: Yes (comment)   Criminal Activity/Legal Involvement Pertinent to Current Situation/Hospitalization: No - Comment as needed  Activities of Daily Living   ADL Screening (condition at time of admission) Independently performs ADLs?: Yes (appropriate for developmental age) Is the patient deaf or have difficulty hearing?: No Does the patient have difficulty seeing, even when wearing glasses/contacts?: No Does the  patient have difficulty concentrating, remembering, or making decisions?: No  Permission Sought/Granted                  Emotional Assessment Appearance:: Appears stated age Attitude/Demeanor/Rapport: Engaged, Gracious Affect (typically observed): Accepting, Appropriate, Calm, Pleasant Orientation: : Oriented to Self, Oriented to Place, Oriented to  Time, Oriented to Situation Alcohol / Substance Use: Not Applicable Psych Involvement: No (comment)  Admission diagnosis:  Pneumonia [J18.9] Nausea vomiting and diarrhea [R11.2, R19.7] Sepsis (HCC) [A41.9] Sepsis, due to unspecified organism, unspecified whether acute organ dysfunction present [facility]) [A41.9] Patient Active Problem List   Diagnosis Date Noted   Acute hypoxemic respiratory failure (HCC) 12/11/2023   CKD stage 3b, GFR 30-44 ml/min (HCC) 12/11/2023   E. coli septicemia (HCC) 12/11/2023   Pneumonia 12/10/2023   Severe sepsis (HCC) 12/10/2023   Abnormal ankle brachial index (ABI) 10/02/2023   Diabetes mellitus (HCC) 09/22/2023   Fibroids 05/31/2023   Axillary hidradenitis suppurativa 09/27/2020   Acute upper GI bleeding 02/08/2020   GIB (gastrointestinal bleeding) 02/07/2020   Hyperlipidemia associated with type 2 diabetes mellitus (HCC) 02/07/2020   Leukocytosis 02/07/2020   Morbid obesity with BMI of 40.0-44.9, adult (HCC) 02/07/2020   Rheumatoid arthritis (HCC)    Symptomatic anemia    Acquired spondylolisthesis 12/19/2019   Abscess of axilla, right 03/01/2019   Pain in right upper arm 03/01/2019   Appendicitis with perforation  Herpes simplex 11/09/2017   Mucopurulent chronic bronchitis (HCC) 11/09/2017   Personal history of venous thrombosis and embolism 10/06/2017   Phlebitis and thrombophlebitis of other sites 06/09/2017   Obstructive sleep apnea of adult 06/09/2017   Gout, unspecified 06/09/2017   Herpesviral vulvovaginitis 06/09/2017   Type 2 diabetes mellitus with diabetic neuropathy, unspecified  (HCC) 06/09/2017   Essential (primary) hypertension 06/09/2017   Chronic anticoagulation 06/09/2017   Pain medication agreement signed 01/05/2017   Subacromial bursitis of left shoulder joint 10/19/2016   Chronic shoulder bursitis, right 06/21/2016   Chronic pain of left knee 07/14/2014   Myofascial muscle pain 07/14/2014   Low back pain 03/18/2014   COPD with acute exacerbation (HCC) 05/01/2013   Acne 09/12/2012   Hand dermatitis 09/12/2012   DVT (deep venous thrombosis) (HCC) 08/31/2012   Constipation 08/16/2012   Gastroesophageal reflux disease without esophagitis 08/16/2012   Asthma 04/10/2012   Tobacco use disorder 04/02/2012   Morbid obesity (HCC) 03/01/2012   Dermatitis 01/11/2012   Depressive disorder 06/22/2010   Type II diabetes mellitus with renal manifestations (HCC) 06/22/2010   PCP:  Liana Fish, NP Pharmacy:   [phone] [place] [place] [address] [place] [address] Phone: [phone] Fax: [phone]  [facility] [place] - [address] [place] [address] Phone: [phone] Fax: [phone]  [facility] [address], [place] - [address] [place] [address] Phone: [phone] Fax: [phone]  [facility] [id number] [place], [place] - [address] [place] [address] Phone: [phone] Fax: [phone]     Social Drivers of Health (SDOH) Social History: SDOH Screenings   Food Insecurity: No Food Insecurity (12/11/2023)  Housing: Low Risk  (12/11/2023)  Transportation Needs: Unknown (12/11/2023)  Utilities: Not At Risk (12/11/2023)  Alcohol Screen: Low Risk  (01/24/2022)  Depression (PHQ2-9): Low Risk  (06/20/2023)  Social Connections: Unknown (09/14/2021)   Received from [facility]  Tobacco Use: Medium Risk (12/10/2023)   SDOH Interventions:     Readmission Risk Interventions    12/12/2023   11:02 AM   Readmission Risk Prevention Plan  Transportation Screening Complete  PCP or Specialist Appt within 3-5 Days Complete  Social Work Consult for Recovery Care Planning/Counseling Complete  Palliative Care Screening Not Applicable  Medication Review [employer]) Complete

## 2023-12-12 NOTE — Care Management Important Message (Signed)
 Important Message  Patient Details  Name: Brenda Santana MRN: [id number] Date of Birth: Jul 16, 1960   Important Message Given:  Yes - Medicare IM     Rojelio SHAUNNA Rattler 12/12/2023, 10:58 AM
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
## 2023-12-12 NOTE — Progress Notes (Signed)
 Pt states she takes tirzepatide  every Monday Family member from home can bring RX to hospital

## 2023-12-12 NOTE — Progress Notes (Signed)
  Progress Note   Date: 12/12/2023  Patient Name: Brenda Santana        MRN#: [id number]   Clarification of the diagnosis of shock:   Patient had septic shock.

## 2023-12-12 NOTE — Progress Notes (Signed)
  Progress Note   Date: 12/12/2023  Patient Name: Brenda Santana        MRN#: [id number]   Clarification of diagnosis:   Aspiration pneumonia   (specify the cause/aspirate & indicate if is postprocedural) Patient had N/V, and hypoxia, likely had B/L aspiration PNA which was treated with ABX.

## 2023-12-13 DIAGNOSIS — B9620 Unspecified Escherichia coli [E. coli] as the cause of diseases classified elsewhere: Secondary | ICD-10-CM

## 2023-12-13 LAB — CULTURE, BLOOD (ROUTINE X 2): Special Requests: ADEQUATE

## 2023-12-13 LAB — CBC WITH DIFFERENTIAL/PLATELET
Abs Immature Granulocytes: 0.04 K/uL (ref 0.00–0.07)
Basophils Absolute: 0 K/uL (ref 0.0–0.1)
Basophils Relative: 0 %
Eosinophils Absolute: 0.1 K/uL (ref 0.0–0.5)
Eosinophils Relative: 1 %
HCT: 37.1 % (ref 36.0–46.0)
Hemoglobin: 11.6 g/dL — ABNORMAL LOW (ref 12.0–15.0)
Immature Granulocytes: 0 %
Lymphocytes Relative: 26 %
Lymphs Abs: 2.8 K/uL (ref 0.7–4.0)
MCH: 30.9 pg (ref 26.0–34.0)
MCHC: 31.3 g/dL (ref 30.0–36.0)
MCV: 98.9 fL (ref 80.0–100.0)
Monocytes Absolute: 1.5 K/uL — ABNORMAL HIGH (ref 0.1–1.0)
Monocytes Relative: 14 %
Neutro Abs: 6.4 K/uL (ref 1.7–7.7)
Neutrophils Relative %: 59 %
Platelets: 261 K/uL (ref 150–400)
RBC: 3.75 MIL/uL — ABNORMAL LOW (ref 3.87–5.11)
RDW: 14.1 % (ref 11.5–15.5)
WBC: 10.7 K/uL — ABNORMAL HIGH (ref 4.0–10.5)
nRBC: 0 % (ref 0.0–0.2)

## 2023-12-13 LAB — BASIC METABOLIC PANEL WITH GFR
Anion gap: 8 (ref 5–15)
BUN: 18 mg/dL (ref 8–23)
CO2: 28 mmol/L (ref 22–32)
Calcium: 9.1 mg/dL (ref 8.9–10.3)
Chloride: 100 mmol/L (ref 98–111)
Creatinine, Ser: 0.92 mg/dL (ref 0.44–1.00)
GFR, Estimated: >60 mL/min (ref >60)
Glucose, Bld: 195 mg/dL — ABNORMAL HIGH (ref 70–99)
Potassium: 4 mmol/L (ref 3.5–5.1)
Sodium: 136 mmol/L (ref 135–145)

## 2023-12-13 LAB — GLUCOSE, CAPILLARY
Glucose-Capillary: 139 mg/dL — ABNORMAL HIGH (ref 70–99)
Glucose-Capillary: 202 mg/dL — ABNORMAL HIGH (ref 70–99)
Glucose-Capillary: 225 mg/dL — ABNORMAL HIGH (ref 70–99)
Glucose-Capillary: 232 mg/dL — ABNORMAL HIGH (ref 70–99)
Glucose-Capillary: 267 mg/dL — ABNORMAL HIGH (ref 70–99)
Glucose-Capillary: 275 mg/dL — ABNORMAL HIGH (ref 70–99)

## 2023-12-13 MED ORDER — LISINOPRIL 10 MG PO TABS
10.0000 mg | ORAL_TABLET | Freq: Every day | ORAL | Status: DC
  Administered 2023-12-13 – 2023-12-14 (×3): 10 mg via ORAL
  Filled 2023-12-13 (×2): qty 1

## 2023-12-13 NOTE — TOC Progression Note (Signed)
 Transition of Care [facility]) - Progression Note    Patient Details  Name: Brenda Santana MRN: [id number] Date of Birth: 02-Nov-1960  [facility]) CM/SW Contact  Lauraine JAYSON Carpen, LCSW Phone Number: 12/13/2023, 10:59 AM  Clinical Narrative: Patient declined home health. She is supposed to start outpatient PT at [facility] on [facility] and would prefer to do that. She declined RW.    Expected Discharge Plan: Home/Self Care Barriers to Discharge: Continued Medical Work up               Expected Discharge Plan and Services     Post Acute Care Choice: NA Living arrangements for the past 2 months: Apartment                                       Social Drivers of Health (SDOH) Interventions SDOH Screenings   Food Insecurity: No Food Insecurity (12/11/2023)  Housing: Low Risk  (12/11/2023)  Transportation Needs: Unknown (12/11/2023)  Utilities: Not At Risk (12/11/2023)  Alcohol Screen: Low Risk  (01/24/2022)  Depression (PHQ2-9): Low Risk  (06/20/2023)  Social Connections: Unknown (09/14/2021)   Received from [facility]  Tobacco Use: Medium Risk (12/10/2023)    Readmission Risk Interventions    12/12/2023   11:02 AM  Readmission Risk Prevention Plan  Transportation Screening Complete  PCP or Specialist Appt within 3-5 Days Complete  Social Work Consult for Recovery Care Planning/Counseling Complete  Palliative Care Screening Not Applicable  Medication Review Oceanographer) Complete
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
## 2023-12-13 NOTE — Plan of Care (Signed)

## 2023-12-14 ENCOUNTER — Other Ambulatory Visit: Payer: Self-pay

## 2023-12-14 DIAGNOSIS — A419 Sepsis, unspecified organism: Secondary | ICD-10-CM | POA: Diagnosis not present

## 2023-12-14 DIAGNOSIS — R6520 Severe sepsis without septic shock: Secondary | ICD-10-CM | POA: Diagnosis not present

## 2023-12-14 LAB — CBC WITH DIFFERENTIAL/PLATELET
Abs Immature Granulocytes: 0.15 K/uL — ABNORMAL HIGH (ref 0.00–0.07)
Basophils Absolute: 0 K/uL (ref 0.0–0.1)
Basophils Relative: 0 %
Eosinophils Absolute: 0.1 K/uL (ref 0.0–0.5)
Eosinophils Relative: 1 %
HCT: 37.7 % (ref 36.0–46.0)
Hemoglobin: 12.1 g/dL (ref 12.0–15.0)
Immature Granulocytes: 1 %
Lymphocytes Relative: 27 %
Lymphs Abs: 3.1 K/uL (ref 0.7–4.0)
MCH: 31.9 pg (ref 26.0–34.0)
MCHC: 32.1 g/dL (ref 30.0–36.0)
MCV: 99.5 fL (ref 80.0–100.0)
Monocytes Absolute: 1.2 K/uL — ABNORMAL HIGH (ref 0.1–1.0)
Monocytes Relative: 11 %
Neutro Abs: 6.9 K/uL (ref 1.7–7.7)
Neutrophils Relative %: 60 %
Platelets: 287 K/uL (ref 150–400)
RBC: 3.79 MIL/uL — ABNORMAL LOW (ref 3.87–5.11)
RDW: 14 % (ref 11.5–15.5)
WBC: 11.4 K/uL — ABNORMAL HIGH (ref 4.0–10.5)
nRBC: 0 % (ref 0.0–0.2)

## 2023-12-14 LAB — BASIC METABOLIC PANEL WITH GFR
Anion gap: 9 (ref 5–15)
BUN: 15 mg/dL (ref 8–23)
CO2: 25 mmol/L (ref 22–32)
Calcium: 9.2 mg/dL (ref 8.9–10.3)
Chloride: 103 mmol/L (ref 98–111)
Creatinine, Ser: 0.8 mg/dL (ref 0.44–1.00)
GFR, Estimated: >60 mL/min (ref >60)
Glucose, Bld: 225 mg/dL — ABNORMAL HIGH (ref 70–99)
Potassium: 4.1 mmol/L (ref 3.5–5.1)
Sodium: 137 mmol/L (ref 135–145)

## 2023-12-14 LAB — GLUCOSE, CAPILLARY
Glucose-Capillary: 232 mg/dL — ABNORMAL HIGH (ref 70–99)
Glucose-Capillary: 183 mg/dL — ABNORMAL HIGH (ref 70–99)
Glucose-Capillary: 233 mg/dL — ABNORMAL HIGH (ref 70–99)

## 2023-12-14 MED ORDER — LANTUS SOLOSTAR 100 UNIT/ML ~~LOC~~ SOPN
24.0000 [IU] | PEN_INJECTOR | Freq: Two times a day (BID) | SUBCUTANEOUS | 3 refills | Status: AC
  Filled 2023-12-14: qty 15, 25d supply, fill #0

## 2023-12-14 MED ORDER — CIPROFLOXACIN HCL 500 MG PO TABS
500.0000 mg | ORAL_TABLET | Freq: Two times a day (BID) | ORAL | 0 refills | Status: DC
  Filled 2023-12-14: qty 10, 5d supply, fill #0

## 2023-12-14 NOTE — Plan of Care (Signed)
  Problem: Education: Goal: Ability to describe self-care measures that may prevent or decrease complications (Diabetes Survival Skills Education) will improve Outcome: Progressing   Problem: Coping: Goal: Ability to adjust to condition or change in health will improve Outcome: Progressing   Problem: Health Behavior/Discharge Planning: Goal: Ability to identify and utilize available resources and services will improve Outcome: Progressing Goal: Ability to manage health-related needs will improve Outcome: Progressing   Problem: Metabolic: Goal: Ability to maintain appropriate glucose levels will improve Outcome: Progressing   Problem: Nutritional: Goal: Maintenance of adequate nutrition will improve Outcome: Progressing Goal: Progress toward achieving an optimal weight will improve Outcome: Progressing

## 2023-12-14 NOTE — Discharge Summary (Signed)
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 revoked.     Resp Syncytial Virus by PCR NEGATIVE NEGATIVE Final    Comment: (NOTE) Fact Sheet for Patients: BloggerCourse.com  Fact Sheet for Healthcare Providers: SeriousBroker.it  This test is not yet approved or cleared by the [place]  FDA and has been authorized for detection and/or diagnosis of SARS-CoV-2 by FDA under an Emergency Use Authorization (EUA). This EUA will remain in effect (meaning this test can be used) for the duration of the COVID-19 declaration under Section 564(b)(1) of the Act, 21 U.S.C. section 360bbb-3(b)(1), unless the authorization is terminated or revoked.  Performed at [facility], [address] [place], [place] [address]   Respiratory (~20 pathogens) panel by PCR     Status: None   Collection Time: 12/10/23  8:20 AM   Specimen: Nasopharyngeal Swab;  Respiratory  Result Value Ref Range Status   Adenovirus NOT DETECTED NOT DETECTED Final   Coronavirus 229E NOT DETECTED NOT DETECTED Final    Comment: (NOTE) The Coronavirus on the Respiratory Panel, DOES NOT test for the novel  Coronavirus (2019 nCoV)    Coronavirus HKU1 NOT DETECTED NOT DETECTED Final   Coronavirus NL63 NOT DETECTED NOT DETECTED Final   Coronavirus OC43 NOT DETECTED NOT DETECTED Final   Metapneumovirus NOT DETECTED NOT DETECTED Final   Rhinovirus / Enterovirus NOT DETECTED NOT DETECTED Final   Influenza A NOT DETECTED NOT DETECTED Final   Influenza B NOT DETECTED NOT DETECTED Final   Parainfluenza Virus 1 NOT DETECTED NOT DETECTED Final   Parainfluenza Virus 2 NOT DETECTED NOT DETECTED Final   Parainfluenza Virus 3 NOT DETECTED NOT DETECTED Final   Parainfluenza Virus 4 NOT DETECTED NOT DETECTED Final   Respiratory Syncytial Virus NOT DETECTED NOT DETECTED Final   Bordetella pertussis NOT DETECTED NOT DETECTED Final   Bordetella Parapertussis NOT DETECTED NOT DETECTED Final   Chlamydophila pneumoniae NOT DETECTED NOT DETECTED Final   Mycoplasma pneumoniae NOT DETECTED NOT DETECTED Final    Comment: Performed at [facility], [address] [place], [place] [address]  Blood culture (routine x 2)     Status: Abnormal   Collection Time: 12/10/23  9:11 AM   Specimen: BLOOD  Result Value Ref Range Status   Specimen Description   Final    BLOOD LEFT ANTECUBITAL Performed at [facility], [address] [place], [place] [address]    Special Requests   Final    BOTTLES DRAWN AEROBIC AND ANAEROBIC Blood Culture results may not be optimal due to an inadequate volume of blood received in culture bottles Performed at [facility], [address] [place], [place] [address]    Culture  Setup Time   Final    GRAM NEGATIVE RODS ANAEROBIC BOTTLE ONLY CRITICAL VALUE NOTED.  VALUE IS CONSISTENT WITH PREVIOUSLY REPORTED AND CALLED VALUE. Performed  at [facility], [address] [place], [place] [address]    Culture (A)  Final    ESCHERICHIA COLI SUSCEPTIBILITIES PERFORMED ON PREVIOUS CULTURE WITHIN THE LAST 5 DAYS. Performed at [facility], [address] [place], [place] [address]    Report Status 12/13/2023 FINAL  Final  Blood culture (routine x 2)     Status: Abnormal   Collection Time: 12/10/23  9:16 AM   Specimen: BLOOD LEFT ARM  Result Value Ref Range Status   Specimen Description   Final  BLOOD LEFT ARM Performed at [facility], [address] [place], [place] [address]    Special Requests   Final    BOTTLES DRAWN AEROBIC AND ANAEROBIC Blood Culture adequate volume Performed at [facility], [address] [place], [place] [address]    Culture  Setup Time   Final    GRAM NEGATIVE RODS AEROBIC BOTTLE ONLY CRITICAL RESULT CALLED TO, READ BACK BY AND VERIFIED WITH: NATHAN BELEUE @0241  ON 12/11/23 SKL Performed at [facility], [address] [place], [place] [address]    Culture ESCHERICHIA COLI (A)  Final   Report Status 12/13/2023 FINAL  Final   Organism ID, Bacteria ESCHERICHIA COLI  Final      Susceptibility   Escherichia coli - MIC*    AMPICILLIN >=32 RESISTANT Resistant     CEFAZOLIN Value in next row Intermediate      4 INTERMEDIATEThis is a modified FDA-approved test that has been validated and its performance characteristics determined by the reporting laboratory.  This laboratory is certified under the Clinical Laboratory Improvement Amendments CLIA as qualified to perform high complexity clinical laboratory testing.    CEFEPIME Value in next row Sensitive      4 INTERMEDIATEThis is a modified FDA-approved test that has been validated and its performance characteristics determined by the reporting laboratory.  This laboratory is certified under the Clinical Laboratory Improvement Amendments CLIA as qualified to perform high complexity clinical laboratory  testing.    ERTAPENEM  Value in next row Sensitive      4 INTERMEDIATEThis is a modified FDA-approved test that has been validated and its performance characteristics determined by the reporting laboratory.  This laboratory is certified under the Clinical Laboratory Improvement Amendments CLIA as qualified to perform high complexity clinical laboratory testing.    CEFTRIAXONE  Value in next row Sensitive      4 INTERMEDIATEThis is a modified FDA-approved test that has been validated and its performance characteristics determined by the reporting laboratory.  This laboratory is certified under the Clinical Laboratory Improvement Amendments CLIA as qualified to perform high complexity clinical laboratory testing.    CIPROFLOXACIN  Value in next row Sensitive      4 INTERMEDIATEThis is a modified FDA-approved test that has been validated and its performance characteristics determined by the reporting laboratory.  This laboratory is certified under the Clinical Laboratory Improvement Amendments CLIA as qualified to perform high complexity clinical laboratory testing.    GENTAMICIN Value in next row Resistant      4 INTERMEDIATEThis is a modified FDA-approved test that has been validated and its performance characteristics determined by the reporting laboratory.  This laboratory is certified under the Clinical Laboratory Improvement Amendments CLIA as qualified to perform high complexity clinical laboratory testing.    MEROPENEM Value in next row Sensitive      4 INTERMEDIATEThis is a modified FDA-approved test that has been validated and its performance characteristics determined by the reporting laboratory.  This laboratory is certified under the Clinical Laboratory Improvement Amendments CLIA as qualified to perform high complexity clinical laboratory testing.    TRIMETH /SULFA  Value in next row Resistant      4 INTERMEDIATEThis is a modified FDA-approved test that has been validated and its performance  characteristics determined by the reporting laboratory.  This laboratory is certified under the Clinical Laboratory Improvement Amendments CLIA as qualified to perform high complexity clinical laboratory testing.    AMPICILLIN/SULBACTAM Value in next row Intermediate      4 INTERMEDIATEThis is  a modified FDA-approved test that has been validated and its performance characteristics determined by the reporting laboratory.  This laboratory is certified under the Clinical Laboratory Improvement Amendments CLIA as qualified to perform high complexity clinical laboratory testing.    PIP/TAZO Value in next row Sensitive ug/mL     <=4 SENSITIVEThis is a modified FDA-approved test that has been validated and its performance characteristics determined by the reporting laboratory.  This laboratory is certified under the Clinical Laboratory Improvement Amendments CLIA as qualified to perform high complexity clinical laboratory testing.    * ESCHERICHIA COLI  Blood Culture ID Panel (Reflexed)     Status: Abnormal   Collection Time: 12/10/23  9:16 AM  Result Value Ref Range Status   Enterococcus faecalis NOT DETECTED NOT DETECTED Final   Enterococcus Faecium NOT DETECTED NOT DETECTED Final   Listeria monocytogenes NOT DETECTED NOT DETECTED Final   Staphylococcus species NOT DETECTED NOT DETECTED Final   Staphylococcus aureus (BCID) NOT DETECTED NOT DETECTED Final   Staphylococcus epidermidis NOT DETECTED NOT DETECTED Final   Staphylococcus lugdunensis NOT DETECTED NOT DETECTED Final   Streptococcus species NOT DETECTED NOT DETECTED Final   Streptococcus agalactiae NOT DETECTED NOT DETECTED Final   Streptococcus pneumoniae NOT DETECTED NOT DETECTED Final   Streptococcus pyogenes NOT DETECTED NOT DETECTED Final   A.calcoaceticus-baumannii NOT DETECTED NOT DETECTED Final   Bacteroides fragilis NOT DETECTED NOT DETECTED Final   Enterobacterales DETECTED (A) NOT DETECTED Final    Comment: Enterobacterales  represent a large order of gram negative bacteria, not a single organism. CRITICAL RESULT CALLED TO, READ BACK BY AND VERIFIED WITH: NATHAN BELEUE @0241  ON 12/11/23 SKL    Enterobacter cloacae complex NOT DETECTED NOT DETECTED Final   Escherichia coli DETECTED (A) NOT DETECTED Final    Comment: CRITICAL RESULT CALLED TO, READ BACK BY AND VERIFIED WITH: NATHAN BELEUE @0241  ON 12/11/23 SKL    Klebsiella aerogenes NOT DETECTED NOT DETECTED Final   Klebsiella oxytoca NOT DETECTED NOT DETECTED Final   Klebsiella pneumoniae NOT DETECTED NOT DETECTED Final   Proteus species NOT DETECTED NOT DETECTED Final   Salmonella species NOT DETECTED NOT DETECTED Final   Serratia marcescens NOT DETECTED NOT DETECTED Final   Haemophilus influenzae NOT DETECTED NOT DETECTED Final   Neisseria meningitidis NOT DETECTED NOT DETECTED Final   Pseudomonas aeruginosa NOT DETECTED NOT DETECTED Final   Stenotrophomonas maltophilia NOT DETECTED NOT DETECTED Final   Candida albicans NOT DETECTED NOT DETECTED Final   Candida auris NOT DETECTED NOT DETECTED Final   Candida glabrata NOT DETECTED NOT DETECTED Final   Candida krusei NOT DETECTED NOT DETECTED Final   Candida parapsilosis NOT DETECTED NOT DETECTED Final   Candida tropicalis NOT DETECTED NOT DETECTED Final   Cryptococcus neoformans/gattii NOT DETECTED NOT DETECTED Final   CTX-M ESBL NOT DETECTED NOT DETECTED Final   Carbapenem resistance IMP NOT DETECTED NOT DETECTED Final   Carbapenem resistance KPC NOT DETECTED NOT DETECTED Final   Carbapenem resistance NDM NOT DETECTED NOT DETECTED Final   Carbapenem resist OXA 48 LIKE NOT DETECTED NOT DETECTED Final   Carbapenem resistance VIM NOT DETECTED NOT DETECTED Final    Comment: Performed at [facility], [address] [place], [place] [address]  MRSA Next Gen by PCR, Nasal     Status: None   Collection Time: 12/10/23  4:17 PM   Specimen: Nasal Mucosa; Nasal Swab  Result Value Ref Range Status    MRSA by PCR Next Gen NOT DETECTED NOT  DETECTED Final    Comment: (NOTE) The GeneXpert MRSA Assay (FDA approved for NASAL specimens only), is one component of a comprehensive MRSA colonization surveillance program. It is not intended to diagnose MRSA infection nor to guide or monitor treatment for MRSA infections. Test performance is not FDA approved in patients less than 73 years old. Performed at [facility], [address] [place], [place] [address]   Urine Culture (for pregnant, neutropenic or urologic patients or patients with an indwelling urinary catheter)     Status: Abnormal   Collection Time: 12/11/23  1:09 PM   Specimen: Urine, Clean Catch  Result Value Ref Range Status   Specimen Description   Final    URINE, CLEAN CATCH Performed at [facility], [address], [place], [place] [address]    Special Requests   Final    NONE Performed at [facility], [address] [place], [place] [address]    Culture (A)  Final    <10,000 COLONIES/mL INSIGNIFICANT GROWTH Performed at [facility], [address] [place], [place] [address]    Report Status 12/12/2023 FINAL  Final  Culture, blood (Routine X 2) w Reflex to ID Panel     Status: None (Preliminary result)   Collection Time: 12/13/23  9:49 AM   Specimen: BLOOD LEFT ARM  Result Value Ref Range Status   Specimen Description BLOOD LEFT ARM  Final   Special Requests   Final    BOTTLES DRAWN AEROBIC AND ANAEROBIC Blood Culture adequate volume   Culture   Final    NO GROWTH < 24 HOURS Performed at [facility], [address] [place], [place] [address]    Report Status PENDING  Incomplete  Culture, blood (Routine X 2) w Reflex to ID Panel     Status: None (Preliminary result)   Collection Time: 12/13/23  9:49 AM   Specimen: BLOOD LEFT HAND  Result Value Ref Range Status   Specimen Description BLOOD LEFT HAND  Final   Special Requests   Final    BOTTLES DRAWN AEROBIC AND  ANAEROBIC Blood Culture adequate volume   Culture   Final    NO GROWTH < 24 HOURS Performed at [facility], [address] [place], [place] [address]    Report Status PENDING  Incomplete    Labs: CBC: Recent Labs  Lab 12/10/23 0821 12/11/23 0406 12/12/23 0354 12/13/23 0949 12/14/23 0905  WBC 15.0* 8.4 16.1* 10.7* 11.4*  NEUTROABS 12.2*  --   --  6.4 6.9  HGB 13.0 12.6 11.9* 11.6* 12.1  HCT 40.4 41.0 36.1 37.1 37.7  MCV 100.2* 102.5* 98.1 98.9 99.5  PLT 205 213 224 261 287   Basic Metabolic Panel: Recent Labs  Lab 12/10/23 0821 12/11/23 0406 12/12/23 0354 12/13/23 0949 12/14/23 0905  NA 130* 132* 135 136 137  K 3.9 4.7 4.4 4.0 4.1  CL 96* 102 98 100 103  CO2 19* 18* 25 28 25   GLUCOSE 240* 355* 284* 195* 225*  BUN 17 25* 31* 18 15  CREATININE 1.57* 1.48* 1.23* 0.92 0.80  CALCIUM  8.9 9.0 9.5 9.1 9.2  MG  --   --  2.7*  --   --    Liver Function Tests: Recent Labs  Lab 12/10/23 0821  AST 44*  ALT 48*  ALKPHOS 108  BILITOT 1.4*  PROT 6.9  ALBUMIN 3.2*   CBG: Recent Labs  Lab 12/13/23 2000 12/13/23 2331 12/14/23 0404 12/14/23 0836 12/14/23  1237  GLUCAP 267* 275* 232* 183* 233*    Discharge time spent: greater than 30 minutes.  Signed: Delon Herald, MD [facility] 12/14/2023

## 2023-12-14 NOTE — Inpatient Diabetes Management (Signed)
 Inpatient Diabetes Program Recommendations  AACE/ADA: New Consensus Statement on Inpatient Glycemic Control (2015)  Target Ranges:  Prepandial:   less than 140 mg/dL      Peak postprandial:   less than 180 mg/dL (1-2 hours)      Critically ill patients:  140 - 180 mg/dL    Latest Reference Range & Units 12/13/23 04:09 12/13/23 08:40 12/13/23 12:39 12/13/23 17:16 12/13/23 20:00  Glucose-Capillary 70 - 99 mg/dL 797 (H) 860 (H) 774 (H) 232 (H) 267 (H)  (H): Data is abnormally high  Latest Reference Range & Units 12/13/23 23:31 12/14/23 04:04  Glucose-Capillary 70 - 99 mg/dL 724 (H) 767 (H)  (H): Data is abnormally high     Home DM Meds: Lantus  6-12 units nightly Farxiga  10 mg daily Amaryl  1 mg daily Mounjaro  12.5 mg weekly  Current Orders: Novolog  Resistant Correction Scale/ SSI (0-20 units) Q4H Semglee  24 units BID    Looks like pt eating 50-100% of meals CBGs remain >200 Last dose Steroids was 9am on 08/11  MD- Please consider:  1. Change Novolog  SSI to TID AC + HS  2. Start Novolog  Meal Coverage: Novolog  6 units TID with meals HOLD if pt NPO HOLD if pt eats <50% meals    --Will follow patient during hospitalization--  Adina Rudolpho Arrow RN, MSN, CDCES Diabetes Coordinator Inpatient Glycemic Control Team Team Pager: [phone] (8a-5p)

## 2023-12-14 NOTE — Plan of Care (Signed)

## 2023-12-15 ENCOUNTER — Telehealth: Payer: Self-pay | Admitting: *Deleted

## 2023-12-15 NOTE — Transitions of Care (Post Inpatient/ED Visit) (Signed)
   12/15/2023  Name: [name] MRN: [id number] DOB: 1960-05-24  Today's TOC FU Call Status: Today's TOC FU Call Status:: Unsuccessful Call (1st Attempt) Unsuccessful Call (1st Attempt) Date: 12/15/23  Attempted to reach the patient regarding the most recent Inpatient/ED visit.  Follow Up Plan: Additional outreach attempts will be made to reach the patient to complete the Transitions of Care (Post Inpatient/ED visit) call.   Mliss Creed [facility], BSN RN Care Manager/ Transition of Care / [facility] [phone]

## 2023-12-18 ENCOUNTER — Telehealth: Payer: Self-pay | Admitting: *Deleted

## 2023-12-18 LAB — CULTURE, BLOOD (ROUTINE X 2)
Culture: NO GROWTH
Culture: NO GROWTH
Special Requests: ADEQUATE
Special Requests: ADEQUATE

## 2023-12-18 NOTE — Transitions of Care (Post Inpatient/ED Visit) (Signed)
   12/18/2023  Name: Brenda Santana MRN: [id number] DOB: [birth year]  Today's TOC FU Call Status: Today's TOC FU Call Status:: Unsuccessful Call (2nd Attempt) Unsuccessful Call (2nd Attempt) Date: 12/18/23  Attempted to reach the patient regarding the most recent Inpatient/ED visit.  Follow Up Plan: Additional outreach attempts will be made to reach the patient to complete the Transitions of Care (Post Inpatient/ED visit) call.   Mliss Creed [facility], BSN RN Care Manager/ Transition of Care [place]/ [facility] [phone]

## 2023-12-19 ENCOUNTER — Telehealth: Payer: Self-pay | Admitting: Nurse Practitioner

## 2023-12-19 NOTE — Telephone Encounter (Signed)
 Received MR from [facility]. [email or web address] attorney office needing office notes, itemized statements, including payments and adjustments with ICD-10 & CPT codes from Nov 09, 2023 to present. Sent secure chat to [name]

## 2023-12-20 ENCOUNTER — Encounter: Payer: Self-pay | Admitting: Nurse Practitioner

## 2023-12-20 ENCOUNTER — Ambulatory Visit (INDEPENDENT_AMBULATORY_CARE_PROVIDER_SITE_OTHER): Admitting: Nurse Practitioner

## 2023-12-20 VITALS — BP 134/72 | HR 74 | Temp 96.3°F | Resp 16 | Ht 65.0 in | Wt 247.0 lb

## 2023-12-20 DIAGNOSIS — E1169 Type 2 diabetes mellitus with other specified complication: Secondary | ICD-10-CM | POA: Diagnosis not present

## 2023-12-20 DIAGNOSIS — Z09 Encounter for follow-up examination after completed treatment for conditions other than malignant neoplasm: Secondary | ICD-10-CM | POA: Diagnosis not present

## 2023-12-20 DIAGNOSIS — G44209 Tension-type headache, unspecified, not intractable: Secondary | ICD-10-CM

## 2023-12-20 DIAGNOSIS — M5442 Lumbago with sciatica, left side: Secondary | ICD-10-CM

## 2023-12-20 DIAGNOSIS — A419 Sepsis, unspecified organism: Secondary | ICD-10-CM

## 2023-12-20 DIAGNOSIS — E785 Hyperlipidemia, unspecified: Secondary | ICD-10-CM | POA: Diagnosis not present

## 2023-12-20 DIAGNOSIS — G8929 Other chronic pain: Secondary | ICD-10-CM

## 2023-12-20 DIAGNOSIS — M5410 Radiculopathy, site unspecified: Secondary | ICD-10-CM | POA: Diagnosis not present

## 2023-12-20 DIAGNOSIS — M5441 Lumbago with sciatica, right side: Secondary | ICD-10-CM

## 2023-12-20 DIAGNOSIS — Z794 Long term (current) use of insulin: Secondary | ICD-10-CM | POA: Diagnosis not present

## 2023-12-20 DIAGNOSIS — R6520 Severe sepsis without septic shock: Secondary | ICD-10-CM | POA: Diagnosis not present

## 2023-12-20 MED ORDER — ATORVASTATIN CALCIUM 10 MG PO TABS: 10.0000 mg | ORAL_TABLET | Freq: Every day | ORAL | 1 refills | Status: DC

## 2023-12-20 MED ORDER — SUMATRIPTAN SUCCINATE 50 MG PO TABS: 50.0000 mg | ORAL_TABLET | Freq: Every day | ORAL | 0 refills | Status: DC | PRN

## 2023-12-20 MED ORDER — TIRZEPATIDE 12.5 MG/0.5ML ~~LOC~~ SOAJ: 12.5000 mg | SUBCUTANEOUS | 1 refills | Status: DC

## 2023-12-20 MED ORDER — HYDROCODONE-ACETAMINOPHEN 5-325 MG PO TABS: 1.0000 | ORAL_TABLET | Freq: Every day | ORAL | 0 refills | Status: DC | PRN
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
## 2023-12-21 ENCOUNTER — Telehealth: Payer: Self-pay | Admitting: Nurse Practitioner

## 2023-12-21 ENCOUNTER — Telehealth: Payer: Self-pay

## 2023-12-21 NOTE — Telephone Encounter (Signed)
$  21.25 invoice faxed to attorney's office for 30 pages-[name]

## 2023-12-21 NOTE — Transitions of Care (Post Inpatient/ED Visit) (Signed)
 12/21/2023  Name: Brenda Santana MRN: [id number] DOB: Dec 06, 1960  Today's TOC FU Call Status: Today's TOC FU Call Status:: Successful TOC FU Call Completed TOC FU Call Complete Date: 12/21/23 Patient's Name and Date of Birth confirmed.  Transition Care Management Follow-up Telephone Call Discharge Facility: [facility]) Type of Discharge: Inpatient Admission Primary Inpatient Discharge Diagnosis:: Severe sepsis How have you been since you were released from the hospital?: Better (states that she still has some shortness of breath but feeling better) Any questions or concerns?: No  Items Reviewed: Did you receive and understand the discharge instructions provided?: Yes Medications obtained,verified, and reconciled?: Yes (Medications Reviewed) Any new allergies since your discharge?: No Dietary orders reviewed?: Yes Type of Diet Ordered:: carb modified Do you have support at home?: Yes People in Home [RPT]: child(ren), adult Name of Support/Comfort Primary Source: [name]  Medications Reviewed Today: Medications Reviewed Today     Reviewed by Rumalda Alan PENNER, RN (Registered Nurse) on 12/21/23 at 0945  Med List Status: <None>   Medication Order Taking? Sig Documenting Provider Last Dose Status Informant  Accu-Chek Softclix Lancets lancets [id number] Yes Use as instructed twice a daily  Dx E11.65 Abernathy, Alyssa, NP  Active Self, Pharmacy Records  acyclovir  (ZOVIRAX ) 400 MG tablet [id number] Yes TAKE ONE TABLET BY MOUTH TWICE DAILY Abernathy, Alyssa, NP  Active Self, Pharmacy Records  albuterol  (VENTOLIN  HFA) 108 (90 Base) MCG/ACT inhaler [id number] Yes Inhale 1-2 puffs into the lungs every 6 (six) hours as needed for wheezing or shortness of breath. Liana Fish, NP  Active Self, Pharmacy Records           Med Note NIKKI, MARIA   Sun Dec 10, 2023 11:13 AM) prn  allopurinol  (ZYLOPRIM ) 300 MG tablet [id number] Yes Take 1 tablet (300 mg total) by mouth  daily. Liana Fish, NP  Active Self, Pharmacy Records  apixaban  (ELIQUIS ) 5 MG TABS tablet [id number] Yes Take 1 tablet (5 mg total) by mouth 2 (two) times daily. Liana Fish, NP  Active Self, Pharmacy Records  aspirin  EC 81 MG tablet [id number] Yes Take 81 mg by mouth daily. [provider]  Active Self, Pharmacy Records  atorvastatin  (LIPITOR) 10 MG tablet [id number] Yes Take 1 tablet (10 mg total) by mouth daily. Liana Fish, NP  Active   Blood Glucose Monitoring Suppl (ACCU-CHEK GUIDE ME) w/Device KIT [id number] Yes Use as directed IKZ88.34 Liana Fish, NP  Active Self, Pharmacy Records  buPROPion  (WELLBUTRIN  XL) 300 MG 24 hr tablet [id number] Yes Take 1 tablet (300 mg total) by mouth daily. Liana Fish, NP  Active Self, Pharmacy Records  cetirizine  (ZYRTEC  ALLERGY) 10 MG tablet [id number] Yes Take 1 tablet (10 mg total) by mouth daily as needed for allergies. Liana Fish, NP  Active Self, Pharmacy Records           Med Note NIKKI, MARIA   Sun Dec 10, 2023 11:16 AM) prn  dapagliflozin  propanediol (FARXIGA ) 10 MG TABS tablet [id number] Yes Take 1 tablet (10 mg total) by mouth daily. Liana Fish, NP  Active Self, Pharmacy Records  docusate sodium  (COLACE) 100 MG capsule [id number] Yes Take 100 mg by mouth daily.  [provider]  Active Self, Pharmacy Records  doxycycline  (VIBRA -TABS) 100 MG tablet [id number] Yes Take 1 tablet (100 mg total) by mouth 2 (two) times daily. Liana Fish, NP  Active Self, Pharmacy Records  famotidine  (PEPCID ) 20 MG tablet [id number] Yes Take 1 tablet (20 mg total) by mouth daily. Liana Fish,  NP  Active Self, Pharmacy Records           Med Note NIKKI, MARIA   Sun Dec 10, 2023 11:20 AM) prn  Ferrous Sulfate  (IRON) 325 (65 Fe) MG TABS [id number] Yes Take 325 mg by mouth 3 (three) times daily.  [provider]  Active Self, Pharmacy Records           Med Note LUNA, [facility]   Fri Feb 07, 2020  10:04 AM)    Fluticasone -Umeclidin-Vilant (TRELEGY ELLIPTA ) 100-62.5-25 MCG/ACT AEPB [id number] Yes Inhale 1 puff into the lungs daily. Liana Fish, NP  Active Self, Pharmacy Records  furosemide  (LASIX ) 20 MG tablet [id number] Yes Take 1 tablet (20 mg total) by mouth daily. Liana Fish, NP  Active Self, Pharmacy Records  gabapentin  (NEURONTIN ) 800 MG tablet [id number] Yes Take 1 tablet (800 mg total) by mouth in the morning, at noon, in the evening, and at bedtime. Liana Fish, NP  Active Self, Pharmacy Records  glimepiride  (AMARYL ) 1 MG tablet [id number] Yes Take 1 mg by mouth daily with breakfast. [provider]  Active Self, Pharmacy Records  glucose blood (ACCU-CHEK GUIDE TEST) test strip [id number] Yes Use 1 test strip to check glucose for diabetes twice daily and as needed dx code E11.65 Liana Fish, NP  Active Self, Pharmacy Records  HYDROcodone -acetaminophen  (NORCO/VICODIN) 5-325 MG tablet [id number] Yes Take 1 tablet by mouth daily as needed (breakthrough pain). Liana Fish, NP  Active   insulin  glargine (LANTUS  SOLOSTAR) 100 UNIT/ML Solostar Pen [id number] Yes Inject 24 Units into the skin 2 (two) times daily. INJECT 6-12 UNITS SUBCUTANEOUSLY AT SUPPER PER SLIDING SCALE Barbarann Nest, MD  Active   Insulin  Pen Needle 32G X 4 MM MISC [id number] Yes Use as directed once daily with lantus  Khan, Fozia M, MD  Active Self, Pharmacy Records  ipratropium-albuterol  (DUONEB) 0.5-2.5 (3) MG/3ML LARRAINE [id number] Yes INHALE THE CONTENTS OF 1 VIAL VIA NEBULIZER EVERY 6 HOURS AS NEEDED FOR SHORTNESS OF BREATH OR WHEEZING Abernathy, Alyssa, NP  Active Self, Pharmacy Records           Med Note NIKKI, MARIA   Sun Dec 10, 2023 11:14 AM) prn  linaclotide  (LINZESS ) 145 MCG CAPS capsule [id number] Yes Take 1 capsule (145 mcg total) by mouth daily before breakfast. Liana Fish, NP  Active Self, Pharmacy Records  lisinopril  (ZESTRIL ) 10 MG tablet [id number] Yes Take 1 tablet (10  mg total) by mouth daily. Liana Fish, NP  Active Self, Pharmacy Records  metoprolol  tartrate (LOPRESSOR ) 50 MG tablet [id number] Yes Take 1 tablet (50 mg total) by mouth 2 (two) times daily. Liana Fish, NP  Active Self, Pharmacy Records  montelukast  (SINGULAIR ) 10 MG tablet [id number] Yes Take 1 tablet (10 mg total) by mouth at bedtime. For asthma Liana Fish, NP  Active Self, Pharmacy Records  oxymetazoline  (AFRIN) 0.05 % nasal spray [id number] Yes Place 1 spray into both nostrils 2 (two) times daily as needed for congestion. [provider]  Active Self, Pharmacy Records           Med Note MAYER, [facility]   Wed Aug 23, 2023  1:35 PM) Taking PRN  pantoprazole  (PROTONIX ) 40 MG tablet [id number] Yes Take 1 tablet (40 mg total) by mouth 2 (two) times daily. Liana Fish, NP  Active Self, Pharmacy Records  predniSONE  (DELTASONE ) 10 MG tablet [id number] Yes Take 2 tablets (20 mg total) by mouth daily. Liana Fish, NP  Active Self, Pharmacy Records  SUMAtriptan  (IMITREX ) 50 MG tablet [id number]  Yes Take 1 tablet (50 mg total) by mouth daily as needed for headache or migraine. May repeat in 2 hours if headache persists or recurs, x1 dose per 24 hours Abernathy, Alyssa, NP  Active   tirzepatide  (MOUNJARO ) 12.5 MG/0.5ML Pen [id number] Yes Inject 12.5 mg into the skin once a week. Liana Fish, NP  Active   tiZANidine  (ZANAFLEX ) 4 MG tablet [id number] Yes Take 1-2 tablets (4-8 mg total) by mouth every 8 (eight) hours as needed for muscle spasms. Liana Fish, NP  Active Self, Pharmacy Records           Med Note NIKKI, MARIA   Sun Dec 10, 2023 11:20 AM) prn  topiramate  (TOPAMAX ) 25 MG tablet [id number] Yes Take 1 tablet (25 mg total) by mouth daily. Liana Fish, NP  Active Self, Pharmacy Records            Home Care and Equipment/Supplies: Were Home Health Services Ordered?: No Any new equipment or medical supplies ordered?: No  Functional  Questionnaire: Do you need assistance with bathing/showering or dressing?: No Do you need assistance with meal preparation?: No Do you need assistance with eating?: No Do you have difficulty maintaining continence: No Do you need assistance with getting out of bed/getting out of a chair/moving?: No Do you have difficulty managing or taking your medications?: No  Follow up appointments reviewed: PCP Follow-up appointment confirmed?: Yes Date of PCP follow-up appointment?: 12/20/23 Follow-up Provider: PCP Specialist Hospital Follow-up appointment confirmed?: Yes Date of Specialist follow-up appointment?: 01/24/24 Follow-Up Specialty Provider:: Neurosurgery Do you need transportation to your follow-up appointment?: No Do you understand care options if your condition(s) worsen?: Yes-patient verbalized understanding  SDOH Interventions Today    Flowsheet Row Most Recent Value  SDOH Interventions   Food Insecurity Interventions Intervention Not Indicated  Housing Interventions Intervention Not Indicated  Transportation Interventions Intervention Not Indicated  Utilities Interventions Intervention Not Indicated    Patient reports that she is doing much better now. Reports discharged home from hospital 7 days ago. States she has already followed up with PCP. Reports that she continues to have some shortness of breath with exertion .  Using her medications as prescribed.   Interventions: Confirmed patient saw PCP. Reviewed and confirmed patient has her medications as is taking as prescribed. Reviewed diagnosis Reviewed DM and how to control Assessed pain  Assessed CBG while on the phone. Encouraged patient to call MD for any changes in condition. Reviewed and offered 30 day TOC and patient declined.   Alan Ee, RN, BSN, CEN [employer]- Transition of Care Team.  [facility] [phone]

## 2023-12-29 ENCOUNTER — Encounter: Payer: Self-pay | Admitting: Nurse Practitioner

## 2024-01-03 ENCOUNTER — Other Ambulatory Visit: Payer: Self-pay

## 2024-01-03 ENCOUNTER — Other Ambulatory Visit: Payer: Self-pay | Admitting: Nurse Practitioner

## 2024-01-03 ENCOUNTER — Telehealth: Payer: Self-pay

## 2024-01-03 DIAGNOSIS — Z79899 Other long term (current) drug therapy: Secondary | ICD-10-CM

## 2024-01-03 NOTE — Progress Notes (Signed)
   01/03/2024  Patient ID: Brenda Santana, female   DOB: 1960/07/16, 63 y.o.   MRN: [id number]  Attempted to contact patient for medication management/review. Left HIPAA compliant message for patient to return my call at their convenience.   Will follow up with patient in 3-5 business days.  Thank you for allowing pharmacy to be a part of this patient's care.  Dorcas Solian, PharmD Clinical Pharmacist Cell: [phone]

## 2024-01-03 NOTE — Progress Notes (Signed)
 01/03/2024 Name: Brenda Santana MRN: [id number] DOB: 12-Oct-1960  Chief Complaint  Patient presents with   Diabetes    {Visit Type:26650}   Subjective:  Care Team: Primary Care Provider: Liana Fish, NP ; Next Scheduled Visit: 01/22/2024 {careteamprovider:27366}  Medication Access/Adherence  Current Pharmacy:  [id number] - [place], [place] - [address] [place] [address] Phone: [phone] Fax: [phone]  [facility] - [place] - [address] [place] [address] Phone: [phone] Fax: [phone]  [facility] [address] [place] - [address] [place] [address] Phone: [phone] Fax: [phone]  [id number] - [place], [place] - [address] [place] [address] Phone: [phone] Fax: [phone]   Patient reports affordability concerns with their medications: No  Patient reports access/transportation concerns to their pharmacy: No  Patient reports adherence concerns with their medications:  No  ***   Diabetes:  Current medications: Mounjaro  12.5 mg weekly (Monday; tolerating well), Lantus  24 units twice daily, Farxiga  10 mg  Medications tried in the past: glimepiride  - patient stopped when she was put on Lantus  (per patient report)- explained it was noted to resume medication Mounjaro  - nausea for one day and then it wore   Current glucose readings: *** 8/21 135 8/22 159 8/23 110 8/24 177 8/25 158 8/26 131  8/27 159 8/28 174  8/29 163 8/30 100 8/31 167 9/1 169 9/2 119 9/3 147 (today)  Discussed using an app and she said she will mention to daughter to ifnd an appt o keep track of blood glucose.    Patient denies hypoglycemic s/sx including dizziness, shakiness, sweating. Patient denies hyperglycemic symptoms including polyuria, polydipsia, polyphagia, nocturia, neuropathy, blurred vision.  Current meal  patterns:  - Breakfast: 2 boiled eggs, peanut butter crackers,  - Lunch sandwiches (burgers, chicken, etc) - Supper: grilled/baked chicken, veggies (corn), snap beans', lima beans, turnip greens  - Snacks grapes (about two handfuls), cherries, ginger snaps, cheese and crackers  - Drinks: zero sugar soda, water   Current physical activity: Walks   Current medication access support: [employer] Medicare dual complete  Macrovascular and Microvascular Risk Reduction:  Statin? yes (atorvastatin  10 mg last sold on 01/02/24 for 30 days supply ); ACEi/ARB? {DM ACEi/ARB pharmacy:33492} Last urinary albumin/creatinine ratio:  Lab Results  Component Value Date   MICRALBCREAT 52 (H) 04/17/2023   MICRALBCREAT 8 04/12/2022   Last eye exam:   Last foot exam: No foot exam found Tobacco Use:  Tobacco Use: Medium Risk (12/29/2023)   Patient History    Smoking Tobacco Use: Former    Smokeless Tobacco Use: Never    Passive Exposure: Not on file     Objective:  Lab Results  Component Value Date   HGBA1C 9.8 (A) 10/02/2023    Lab Results  Component Value Date   CREATININE 0.80 12/14/2023   BUN 15 12/14/2023   NA 137 12/14/2023   K 4.1 12/14/2023   CL 103 12/14/2023   CO2 25 12/14/2023    Lab Results  Component Value Date   CHOL 172 06/02/2023   HDL 71 06/02/2023   LDLCALC 87 06/02/2023   TRIG 72 06/02/2023   CHOLHDL 2.4 06/02/2023    Medications Reviewed Today     Reviewed by Cleatus Dorcas SAUNDERS, RPH (Pharmacist) on 01/03/24 at 1528  Med List Status: <None>   Medication Order Taking? Sig Documenting Provider  Last Dose Status Informant  Accu-Chek Softclix Lancets lancets [id number] Yes Use as instructed twice a daily  Dx E11.65 Liana Fish, NP  Active Self, Pharmacy Records  acyclovir  (ZOVIRAX ) 400 MG tablet [id number] Yes TAKE ONE TABLET BY MOUTH TWICE DAILY Abernathy, Alyssa, NP  Active Self, Pharmacy Records  albuterol  (VENTOLIN  HFA) 108 (90 Base) MCG/ACT inhaler [id number] Yes  Inhale 1-2 puffs into the lungs every 6 (six) hours as needed for wheezing or shortness of breath. Liana Fish, NP  Active Self, Pharmacy Records           Med Note NIKKI, MARIA   Sun Dec 10, 2023 11:13 AM) prn  allopurinol  (ZYLOPRIM ) 300 MG tablet [id number] Yes TAKE ONE TABLET BY MOUTH EVERY DAY Abernathy, Alyssa, NP  Active   apixaban  (ELIQUIS ) 5 MG TABS tablet [id number] Yes Take 1 tablet (5 mg total) by mouth 2 (two) times daily. Liana Fish, NP  Active Self, Pharmacy Records  aspirin  EC 81 MG tablet [id number] Yes Take 81 mg by mouth daily. [provider]  Active Self, Pharmacy Records  atorvastatin  (LIPITOR) 10 MG tablet [id number] Yes Take 1 tablet (10 mg total) by mouth daily. Liana Fish, NP  Active   Blood Glucose Monitoring Suppl (ACCU-CHEK GUIDE ME) w/Device KIT [id number] Yes Use as directed IKZ88.34 Liana Fish, NP  Active Self, Pharmacy Records  buPROPion  (WELLBUTRIN  XL) 300 MG 24 hr tablet [id number] Yes Take 1 tablet (300 mg total) by mouth daily. Liana Fish, NP  Active Self, Pharmacy Records  cetirizine  (ZYRTEC  ALLERGY) 10 MG tablet [id number] Yes Take 1 tablet (10 mg total) by mouth daily as needed for allergies. Liana Fish, NP  Active Self, Pharmacy Records           Med Note NIKKI, HADASSAH Repress Dec 10, 2023 11:16 AM) prn   Discontinued 01/03/24 1527 (Change in therapy)   dapagliflozin  propanediol (FARXIGA ) 10 MG TABS tablet [id number] Yes Take 1 tablet (10 mg total) by mouth daily. Liana Fish, NP  Active Self, Pharmacy Records  diclofenac Sodium (VOLTAREN) 1 % GEL [id number]  Apply 2 g topically daily as needed (pain). [provider]  Active   docusate sodium  (COLACE) 100 MG capsule [id number] Yes Take 100 mg by mouth daily.  [provider]  Active Self, Pharmacy Records  doxycycline  (VIBRA -TABS) 100 MG tablet [id number] Yes Take 1 tablet (100 mg total) by mouth 2 (two) times daily. Liana Fish, NP   Active Self, Pharmacy Records  famotidine  (PEPCID ) 20 MG tablet [id number] Yes Take 1 tablet (20 mg total) by mouth daily. Liana Fish, NP  Active Self, Pharmacy Records           Med Note NIKKI, MARIA   Sun Dec 10, 2023 11:20 AM) prn  Ferrous Sulfate  (IRON) 325 (65 Fe) MG TABS [id number] Yes Take 325 mg by mouth 3 (three) times daily.  [provider]  Active Self, Pharmacy Records           Med Note LUNA, [facility]   Fri Feb 07, 2020 10:04 AM)    Fluticasone -Umeclidin-Vilant (TRELEGY ELLIPTA ) 100-62.5-25 MCG/ACT AEPB [id number] Yes Inhale 1 puff into the lungs daily. Liana Fish, NP  Active Self, Pharmacy Records  furosemide  (LASIX ) 20 MG tablet [id number] Yes Take 1 tablet (20 mg total) by mouth daily. Liana Fish, NP  Active Self, Pharmacy Records  gabapentin  (NEURONTIN ) 800 MG tablet [id number] Yes Take 1 tablet (800 mg total) by mouth in the morning, at noon, in the evening, and at  bedtime. Liana Fish, NP  Active Self, Pharmacy Records  glimepiride  (AMARYL ) 1 MG tablet [id number]  Take 1 mg by mouth daily with breakfast.  Patient not taking: Reported on 01/03/2024   [provider]  Active Self, Pharmacy Records  glucose blood (ACCU-CHEK GUIDE TEST) test strip [id number] Yes Use 1 test strip to check glucose for diabetes twice daily and as needed dx code E11.65 Liana Fish, NP  Active Self, Pharmacy Records  HYDROcodone -acetaminophen  (NORCO/VICODIN) 5-325 MG tablet [id number] Yes Take 1 tablet by mouth daily as needed (breakthrough pain). Liana Fish, NP  Active   insulin  glargine (LANTUS  SOLOSTAR) 100 UNIT/ML Solostar Pen [id number] Yes Inject 24 Units into the skin 2 (two) times daily. INJECT 6-12 UNITS SUBCUTANEOUSLY AT SUPPER PER SLIDING SCALE Barbarann Nest, MD  Active            Med Note MAYER, [facility]   Wed Jan 03, 2024  3:16 PM) She confirmed she is taking 24 units twice daily  Insulin  Pen Needle 32G X 4 MM MISC [id number] Yes Use as  directed once daily with lantus  Khan, Fozia M, MD  Active Self, Pharmacy Records  ipratropium-albuterol  (DUONEB) 0.5-2.5 (3) MG/3ML LARRAINE [id number] Yes INHALE THE CONTENTS OF 1 VIAL VIA NEBULIZER EVERY 6 HOURS AS NEEDED FOR SHORTNESS OF BREATH OR WHEEZING Abernathy, Alyssa, NP  Active Self, Pharmacy Records           Med Note NIKKI, MARIA   Sun Dec 10, 2023 11:14 AM) prn  linaclotide  (LINZESS ) 145 MCG CAPS capsule [id number] Yes Take 1 capsule (145 mcg total) by mouth daily before breakfast. Liana Fish, NP  Active Self, Pharmacy Records  lisinopril  (ZESTRIL ) 10 MG tablet [id number] Yes Take 1 tablet (10 mg total) by mouth daily. Liana Fish, NP  Active Self, Pharmacy Records  metoprolol  tartrate (LOPRESSOR ) 50 MG tablet [id number] Yes Take 1 tablet (50 mg total) by mouth 2 (two) times daily. Liana Fish, NP  Active Self, Pharmacy Records  montelukast  (SINGULAIR ) 10 MG tablet [id number] Yes Take 1 tablet (10 mg total) by mouth at bedtime. For asthma Liana Fish, NP  Active Self, Pharmacy Records  oxymetazoline  (AFRIN) 0.05 % nasal spray [id number] Yes Place 1 spray into both nostrils 2 (two) times daily as needed for congestion. [provider]  Active Self, Pharmacy Records           Med Note MAYER, [facility]   Wed Aug 23, 2023  1:35 PM) Taking PRN  pantoprazole  (PROTONIX ) 40 MG tablet [id number] Yes Take 1 tablet (40 mg total) by mouth 2 (two) times daily. Liana Fish, NP  Active Self, Pharmacy Records  predniSONE  (DELTASONE ) 10 MG tablet [id number] Yes Take 2 tablets (20 mg total) by mouth daily. Liana Fish, NP  Active Self, Pharmacy Records  SUMAtriptan  (IMITREX ) 50 MG tablet [id number] Yes Take 1 tablet (50 mg total) by mouth daily as needed for headache or migraine. May repeat in 2 hours if headache persists or recurs, x1 dose per 24 hours Abernathy, Alyssa, NP  Active   tirzepatide  (MOUNJARO ) 12.5 MG/0.5ML Pen [id number] Yes Inject 12.5 mg into the skin  once a week. Liana Fish, NP  Active            Med Note MAYER, [facility]   Wed Jan 03, 2024  3:19 PM) Mondays   tiZANidine  (ZANAFLEX ) 4 MG tablet [id number] Yes Take 1-2 tablets (4-8 mg total) by mouth every 8 (eight) hours as needed for muscle spasms. Liana Fish, NP  Active Self, Pharmacy Records           Med Note NIKKI, MARIA   Sun Dec 10, 2023 11:20 AM) prn  topiramate  (TOPAMAX ) 25 MG tablet [id number] Yes Take 1 tablet (25 mg total) by mouth daily. Liana Fish, NP  Active Self, Pharmacy Records              Assessment/Plan:   Diabetes: - Currently {CHL Controlled/Uncontrolled:[phone]}; goal A1c <7%. Cardiorenal risk reduction is {DMcardiorenalrisk:33490}. Blood pressure {ACTION; IS/IS NOT:21021397} at goal <130/80. LDL {ACTION; IS/IS NOT:21021397} at goal.  - {DMinterventions:33484} - {start stop continue:33488} - {DM Med Counseling:33486:x} - she would like to increase to Mounjaro  15 mg weekly and willing to try and see if she will tolerate again   Follow Up Plan: 4-5 weeks  - Discussed with patient that she is supposed to continue glimepiride  based on last provider notes, but patient does not take medication as she reported it was stopped when she started Lantus .  - Dose adjustment for Lantus  or Mounjaro    Dorcas Solian, PharmD Clinical Pharmacist Cell: [phone]

## 2024-01-08 ENCOUNTER — Other Ambulatory Visit: Payer: Self-pay | Admitting: Nurse Practitioner

## 2024-01-08 DIAGNOSIS — G8929 Other chronic pain: Secondary | ICD-10-CM

## 2024-01-11 ENCOUNTER — Telehealth: Payer: Self-pay

## 2024-01-11 ENCOUNTER — Other Ambulatory Visit: Payer: Self-pay

## 2024-01-11 DIAGNOSIS — G8929 Other chronic pain: Secondary | ICD-10-CM

## 2024-01-11 DIAGNOSIS — M5410 Radiculopathy, site unspecified: Secondary | ICD-10-CM

## 2024-01-11 MED ORDER — TIZANIDINE HCL 4 MG PO TABS
4.0000 mg | ORAL_TABLET | Freq: Three times a day (TID) | ORAL | 2 refills | Status: AC | PRN
Start: 2024-01-11 — End: ?

## 2024-01-12 ENCOUNTER — Other Ambulatory Visit: Payer: Self-pay | Admitting: Nurse Practitioner

## 2024-01-12 DIAGNOSIS — G44209 Tension-type headache, unspecified, not intractable: Secondary | ICD-10-CM

## 2024-01-12 MED ORDER — HYDROCODONE-ACETAMINOPHEN 5-325 MG PO TABS: 1.0000 | ORAL_TABLET | Freq: Every day | ORAL | 0 refills | Status: DC | PRN

## 2024-01-12 NOTE — Telephone Encounter (Signed)
 Patient notified
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
## 2024-01-22 ENCOUNTER — Ambulatory Visit (INDEPENDENT_AMBULATORY_CARE_PROVIDER_SITE_OTHER): Admitting: Nurse Practitioner

## 2024-01-22 ENCOUNTER — Encounter: Payer: Self-pay | Admitting: Nurse Practitioner

## 2024-01-22 VITALS — BP 145/82 | HR 75 | Temp 97.0°F | Resp 16 | Ht 65.0 in | Wt 252.6 lb

## 2024-01-22 DIAGNOSIS — Z23 Encounter for immunization: Secondary | ICD-10-CM | POA: Diagnosis not present

## 2024-01-22 DIAGNOSIS — Z794 Long term (current) use of insulin: Secondary | ICD-10-CM

## 2024-01-22 DIAGNOSIS — M5410 Radiculopathy, site unspecified: Secondary | ICD-10-CM | POA: Diagnosis not present

## 2024-01-22 DIAGNOSIS — M5442 Lumbago with sciatica, left side: Secondary | ICD-10-CM

## 2024-01-22 DIAGNOSIS — M4319 Spondylolisthesis, multiple sites in spine: Secondary | ICD-10-CM | POA: Diagnosis not present

## 2024-01-22 DIAGNOSIS — E1169 Type 2 diabetes mellitus with other specified complication: Secondary | ICD-10-CM

## 2024-01-22 DIAGNOSIS — M5441 Lumbago with sciatica, right side: Secondary | ICD-10-CM

## 2024-01-22 DIAGNOSIS — G8929 Other chronic pain: Secondary | ICD-10-CM

## 2024-01-22 LAB — POCT GLYCOSYLATED HEMOGLOBIN (HGB A1C): Hemoglobin A1C: 10.5 % — AB (ref 4.0–5.6)

## 2024-01-22 MED ORDER — HYDROCODONE-ACETAMINOPHEN 5-325 MG PO TABS: 1.0000 | ORAL_TABLET | Freq: Two times a day (BID) | ORAL | 0 refills | Status: DC | PRN

## 2024-01-22 MED ORDER — TETANUS-DIPHTH-ACELL PERTUSSIS 5-2-15.5 LF-MCG/0.5 IM SUSP: 0.5000 mL | Freq: Once | INTRAMUSCULAR | 0 refills | Status: AC | PRN
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
## 2024-01-23 ENCOUNTER — Telehealth: Payer: Self-pay | Admitting: Nurse Practitioner

## 2024-01-23 NOTE — Telephone Encounter (Signed)
 Awaiting 01/22/24 office notes for Urgent Endocrinology referral-[name]

## 2024-01-24 ENCOUNTER — Ambulatory Visit (INDEPENDENT_AMBULATORY_CARE_PROVIDER_SITE_OTHER): Admitting: Orthopedic Surgery

## 2024-01-24 ENCOUNTER — Encounter: Payer: Self-pay | Admitting: Orthopedic Surgery

## 2024-01-24 ENCOUNTER — Telehealth: Payer: Self-pay | Admitting: Nurse Practitioner

## 2024-01-24 ENCOUNTER — Encounter: Payer: Self-pay | Admitting: Nurse Practitioner

## 2024-01-24 ENCOUNTER — Other Ambulatory Visit: Payer: Self-pay | Admitting: Nurse Practitioner

## 2024-01-24 VITALS — BP 130/84 | Ht 65.0 in | Wt 250.0 lb

## 2024-01-24 DIAGNOSIS — M47816 Spondylosis without myelopathy or radiculopathy, lumbar region: Secondary | ICD-10-CM

## 2024-01-24 DIAGNOSIS — M4854XS Collapsed vertebra, not elsewhere classified, thoracic region, sequela of fracture: Secondary | ICD-10-CM | POA: Diagnosis not present

## 2024-01-24 DIAGNOSIS — M48062 Spinal stenosis, lumbar region with neurogenic claudication: Secondary | ICD-10-CM

## 2024-01-24 DIAGNOSIS — M4316 Spondylolisthesis, lumbar region: Secondary | ICD-10-CM

## 2024-01-24 DIAGNOSIS — M48061 Spinal stenosis, lumbar region without neurogenic claudication: Secondary | ICD-10-CM

## 2024-01-24 DIAGNOSIS — M5410 Radiculopathy, site unspecified: Secondary | ICD-10-CM | POA: Insufficient documentation

## 2024-01-24 DIAGNOSIS — M5416 Radiculopathy, lumbar region: Secondary | ICD-10-CM

## 2024-01-24 NOTE — Telephone Encounter (Signed)
 Urgent Endocrinology referral sent via [facility] to [facility]. Notified patient. Gave telephone # [phone]

## 2024-01-24 NOTE — Patient Instructions (Signed)
 It was so nice to see you today. Thank you so much for coming in.    I sent physical therapy orders to [facility] in [place]. You can call them at [phone] if you don't hear from them to schedule your visit.   Work with PCP and endocrine on getting your blood sugars under better control.   Follow up with Whitney as scheduled.   If no relief with injections, you may be a candidate for surgery but blood sugars would have to be optimized with HgbA1c less than 7.5, you would need to complete PT, and you would need to lose weight.   I will see you back in 6-8 weeks. Please do not hesitate to call if you have any questions or concerns. You can also message me in MyChart.   Glade Boys PA-C [phone]     The physicians and staff at [facility] Neurosurgery at [facility] are committed to providing excellent care. You may receive a survey asking for feedback about your experience at our office. We value you your feedback and appreciate you taking the time to to fill it out. The [facility] leadership team is also available to discuss your experience in person, feel free to contact us  [phone].

## 2024-01-30 ENCOUNTER — Other Ambulatory Visit: Payer: Self-pay | Admitting: Nurse Practitioner

## 2024-01-30 DIAGNOSIS — I739 Peripheral vascular disease, unspecified: Secondary | ICD-10-CM

## 2024-01-30 DIAGNOSIS — J449 Chronic obstructive pulmonary disease, unspecified: Secondary | ICD-10-CM

## 2024-01-30 DIAGNOSIS — G8929 Other chronic pain: Secondary | ICD-10-CM

## 2024-01-30 DIAGNOSIS — Z79899 Other long term (current) drug therapy: Secondary | ICD-10-CM

## 2024-02-02 ENCOUNTER — Telehealth: Payer: Self-pay | Admitting: Nurse Practitioner

## 2024-02-02 ENCOUNTER — Other Ambulatory Visit: Payer: Self-pay | Admitting: Nurse Practitioner

## 2024-02-02 DIAGNOSIS — Z79899 Other long term (current) drug therapy: Secondary | ICD-10-CM

## 2024-02-02 NOTE — Telephone Encounter (Signed)
 Endocrinology appointment 02/20/2024 with [facility]-[name]

## 2024-02-15 DIAGNOSIS — E785 Hyperlipidemia, unspecified: Secondary | ICD-10-CM | POA: Diagnosis not present

## 2024-02-15 DIAGNOSIS — E1159 Type 2 diabetes mellitus with other circulatory complications: Secondary | ICD-10-CM | POA: Diagnosis not present

## 2024-02-15 DIAGNOSIS — Z794 Long term (current) use of insulin: Secondary | ICD-10-CM | POA: Diagnosis not present

## 2024-02-15 DIAGNOSIS — E1169 Type 2 diabetes mellitus with other specified complication: Secondary | ICD-10-CM | POA: Diagnosis not present

## 2024-02-15 DIAGNOSIS — Z599 Problem related to housing and economic circumstances, unspecified: Secondary | ICD-10-CM | POA: Diagnosis not present

## 2024-02-15 DIAGNOSIS — E1165 Type 2 diabetes mellitus with hyperglycemia: Secondary | ICD-10-CM | POA: Diagnosis not present

## 2024-02-15 DIAGNOSIS — I152 Hypertension secondary to endocrine disorders: Secondary | ICD-10-CM | POA: Diagnosis not present

## 2024-02-20 DIAGNOSIS — E1165 Type 2 diabetes mellitus with hyperglycemia: Secondary | ICD-10-CM | POA: Diagnosis not present

## 2024-02-23 ENCOUNTER — Other Ambulatory Visit: Payer: Self-pay | Admitting: Nurse Practitioner

## 2024-02-23 DIAGNOSIS — G8929 Other chronic pain: Secondary | ICD-10-CM

## 2024-02-23 DIAGNOSIS — I739 Peripheral vascular disease, unspecified: Secondary | ICD-10-CM

## 2024-02-28 ENCOUNTER — Other Ambulatory Visit: Payer: Self-pay | Admitting: Nurse Practitioner

## 2024-02-28 ENCOUNTER — Telehealth: Payer: Self-pay

## 2024-02-28 DIAGNOSIS — Z79899 Other long term (current) drug therapy: Secondary | ICD-10-CM

## 2024-02-29 ENCOUNTER — Encounter: Payer: Self-pay | Admitting: Student in an Organized Health Care Education/Training Program

## 2024-02-29 ENCOUNTER — Ambulatory Visit
Attending: Student in an Organized Health Care Education/Training Program | Admitting: Student in an Organized Health Care Education/Training Program

## 2024-02-29 VITALS — BP 118/63 | HR 81 | Temp 97.7°F | Resp 16 | Ht 66.0 in | Wt 247.0 lb

## 2024-02-29 DIAGNOSIS — M5416 Radiculopathy, lumbar region: Secondary | ICD-10-CM | POA: Diagnosis not present

## 2024-02-29 DIAGNOSIS — G894 Chronic pain syndrome: Secondary | ICD-10-CM | POA: Insufficient documentation

## 2024-02-29 DIAGNOSIS — G8929 Other chronic pain: Secondary | ICD-10-CM | POA: Diagnosis not present

## 2024-02-29 DIAGNOSIS — M47816 Spondylosis without myelopathy or radiculopathy, lumbar region: Secondary | ICD-10-CM | POA: Insufficient documentation
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
## 2024-02-29 NOTE — Progress Notes (Signed)
 Safety precautions to be maintained throughout the outpatient stay will include: orient to surroundings, keep bed in low position, maintain call bell within reach at all times, provide assistance with transfer out of bed and ambulation.

## 2024-03-02 ENCOUNTER — Other Ambulatory Visit: Payer: Self-pay | Admitting: Nurse Practitioner

## 2024-03-02 DIAGNOSIS — G8929 Other chronic pain: Secondary | ICD-10-CM

## 2024-03-02 DIAGNOSIS — I739 Peripheral vascular disease, unspecified: Secondary | ICD-10-CM

## 2024-03-02 DIAGNOSIS — R600 Localized edema: Secondary | ICD-10-CM

## 2024-03-04 LAB — COMPLIANCE DRUG ANALYSIS, UR

## 2024-03-06 NOTE — Telephone Encounter (Signed)
 Spoke to dfk if patient calls back about needing pain pills she needs to be seen.

## 2024-03-07 ENCOUNTER — Encounter: Payer: Self-pay | Admitting: Nurse Practitioner

## 2024-03-07 ENCOUNTER — Ambulatory Visit: Attending: Nurse Practitioner | Admitting: Nurse Practitioner

## 2024-03-07 VITALS — BP 134/87 | HR 95 | Temp 97.0°F | Resp 18 | Ht 66.0 in | Wt 247.0 lb

## 2024-03-07 DIAGNOSIS — M47816 Spondylosis without myelopathy or radiculopathy, lumbar region: Secondary | ICD-10-CM | POA: Insufficient documentation

## 2024-03-07 DIAGNOSIS — G894 Chronic pain syndrome: Secondary | ICD-10-CM | POA: Diagnosis not present

## 2024-03-07 DIAGNOSIS — Z0289 Encounter for other administrative examinations: Secondary | ICD-10-CM | POA: Insufficient documentation

## 2024-03-07 DIAGNOSIS — G8929 Other chronic pain: Secondary | ICD-10-CM | POA: Diagnosis present

## 2024-03-07 DIAGNOSIS — Z7189 Other specified counseling: Secondary | ICD-10-CM | POA: Insufficient documentation

## 2024-03-07 DIAGNOSIS — M5416 Radiculopathy, lumbar region: Secondary | ICD-10-CM | POA: Insufficient documentation

## 2024-03-07 MED ORDER — HYDROCODONE-ACETAMINOPHEN 5-325 MG PO TABS: 1.0000 | ORAL_TABLET | Freq: Two times a day (BID) | ORAL | 0 refills | Status: DC | PRN
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
## 2024-03-07 NOTE — Progress Notes (Signed)
 Safety precautions to be maintained throughout the outpatient stay will include: orient to surroundings, keep bed in low position, maintain call bell within reach at all times, provide assistance with transfer out of bed and ambulation.

## 2024-03-13 ENCOUNTER — Ambulatory Visit: Admitting: Orthopedic Surgery

## 2024-03-18 NOTE — Progress Notes (Addendum)
 Brenda Santana                                          MRN: [id number]   03/18/2024   The [facility] Quality Team Specialist reviewed this patient medical record for the purposes of chart review for care gap closure. The following were reviewed: chart review for care gap closure-glycemic status assessment. CHART REVIEW AGAIN FOR GSD MEASURE. A1C LABS ARE OOR.    [facility] Quality Team

## 2024-03-19 ENCOUNTER — Telehealth: Payer: Self-pay

## 2024-03-20 NOTE — Telephone Encounter (Signed)
 Lmom need appt with [facility] for A1c high

## 2024-03-20 NOTE — Telephone Encounter (Signed)
 Pt had appt  with endo and pt like to keep appt with alyssa

## 2024-03-27 ENCOUNTER — Other Ambulatory Visit: Payer: Self-pay | Admitting: Nurse Practitioner

## 2024-03-27 DIAGNOSIS — Z79899 Other long term (current) drug therapy: Secondary | ICD-10-CM

## 2024-03-29 NOTE — Progress Notes (Signed)
 [facility]  Care Management -  Follow-Up Engagement  Purpose: A [facility] completed a phone call to address follow-up care needs.  Brenda Santana is being managed for the following: community resources, diabetes, nutrition, Health Related Social Needs [facility], [place]), and medication management, advance directives.   The patient states that she is okay.  She can talk briefly today.She confirmed that she had received resource information for agencies/organizations that may assist with her rent.  Reports that she made some calls to agencies and they did not have funding.  She reports that she call these agencies again to see if they now have some funding.  CM will review for additional resources.  She stated that she is still behind in her rent but has not receiving any notification from management about past due rent. Patient has been sent food resources to review as well. Advance directives have been mailed for her review.  Patient provided update that she has now started physical therapy at [facility] in [place]. She reports that this has been helpful.   Patient confirmed upcoming medical appointment. Follow up appointment schedule for ongoing care management.   Assessment and Intervention of Needs: [facility] Assessment completed.   Plan: Care Plan Discussed - Yes - Stacyann Mcconaughy has agreed to participate with the identified care plan that was discussed on 03/29/2024. Please see Plan of Care in the Notes section of Chart Review  Next follow up appointment date: 04/12/24 Future Appointments     Date/Time Provider Department Center Visit Type   04/10/2024 3:30 PM Brendia Calton Squires, PA [facility] RETURN VISIT   04/12/2024 11:30 AM Octavia Leita HUG[facility] FOLLOW UP       [facility]URA  [facility]    [address], [address]; [place], [place] [address] l  [email or web address] l [phone]   For more information on [facility] services, click  here.
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
## 2024-04-01 ENCOUNTER — Encounter: Payer: Self-pay | Admitting: Nurse Practitioner

## 2024-04-01 ENCOUNTER — Ambulatory Visit: Attending: Nurse Practitioner | Admitting: Nurse Practitioner

## 2024-04-01 VITALS — BP 118/63 | HR 86 | Temp 97.3°F | Resp 18 | Ht 66.0 in | Wt 247.0 lb

## 2024-04-01 DIAGNOSIS — M5416 Radiculopathy, lumbar region: Secondary | ICD-10-CM | POA: Diagnosis present

## 2024-04-01 DIAGNOSIS — G894 Chronic pain syndrome: Secondary | ICD-10-CM | POA: Diagnosis present

## 2024-04-01 DIAGNOSIS — G8929 Other chronic pain: Secondary | ICD-10-CM | POA: Insufficient documentation

## 2024-04-01 DIAGNOSIS — Z79899 Other long term (current) drug therapy: Secondary | ICD-10-CM | POA: Insufficient documentation

## 2024-04-01 DIAGNOSIS — Z0289 Encounter for other administrative examinations: Secondary | ICD-10-CM | POA: Diagnosis present

## 2024-04-01 DIAGNOSIS — M47816 Spondylosis without myelopathy or radiculopathy, lumbar region: Secondary | ICD-10-CM | POA: Insufficient documentation

## 2024-04-01 DIAGNOSIS — Z7189 Other specified counseling: Secondary | ICD-10-CM | POA: Insufficient documentation

## 2024-04-01 MED ORDER — HYDROCODONE-ACETAMINOPHEN 5-325 MG PO TABS: 1.0000 | ORAL_TABLET | Freq: Two times a day (BID) | ORAL | 0 refills | Status: DC | PRN

## 2024-04-01 MED ORDER — BUPRENORPHINE 5 MCG/HR TD PTWK: 1.0000 | MEDICATED_PATCH | TRANSDERMAL | 0 refills | Status: DC

## 2024-04-01 NOTE — Progress Notes (Signed)
 Nursing Pain Medication Assessment:  Safety precautions to be maintained throughout the outpatient stay will include: orient to surroundings, keep bed in low position, maintain call bell within reach at all times, provide assistance with transfer out of bed and ambulation.  Medication Inspection Compliance: Pill count conducted under aseptic conditions, in front of the patient. Neither the pills nor the bottle was removed from the patient's sight at any time. Once count was completed pills were immediately returned to the patient in their original bottle.  Medication: Oxycodone /APAP Pill/Patch Count: 0 of 60 pills/patches remain Pill/Patch Appearance: Markings consistent with prescribed medication Bottle Appearance: Standard pharmacy container. Clearly labeled. Filled Date: 4 / 06 / 2025 Last Medication intake:  Ran out of medicine more than 48 hours ago

## 2024-04-05 ENCOUNTER — Other Ambulatory Visit: Payer: Self-pay | Admitting: Nurse Practitioner

## 2024-04-05 DIAGNOSIS — G8929 Other chronic pain: Secondary | ICD-10-CM

## 2024-04-12 ENCOUNTER — Telehealth: Payer: Self-pay

## 2024-04-18 ENCOUNTER — Telehealth: Payer: Self-pay

## 2024-04-18 ENCOUNTER — Ambulatory Visit: Admitting: Internal Medicine

## 2024-04-18 ENCOUNTER — Encounter: Payer: Self-pay | Admitting: Nurse Practitioner

## 2024-04-18 VITALS — BP 119/67 | HR 85 | Temp 96.9°F | Resp 16 | Ht 66.0 in | Wt 252.6 lb

## 2024-04-18 DIAGNOSIS — E1159 Type 2 diabetes mellitus with other circulatory complications: Secondary | ICD-10-CM | POA: Diagnosis not present

## 2024-04-18 DIAGNOSIS — I152 Hypertension secondary to endocrine disorders: Secondary | ICD-10-CM

## 2024-04-18 DIAGNOSIS — Z6841 Body Mass Index (BMI) 40.0 and over, adult: Secondary | ICD-10-CM

## 2024-04-18 DIAGNOSIS — J449 Chronic obstructive pulmonary disease, unspecified: Secondary | ICD-10-CM

## 2024-04-18 DIAGNOSIS — Z794 Long term (current) use of insulin: Secondary | ICD-10-CM | POA: Diagnosis not present

## 2024-04-18 DIAGNOSIS — E1169 Type 2 diabetes mellitus with other specified complication: Secondary | ICD-10-CM | POA: Diagnosis not present

## 2024-04-18 DIAGNOSIS — G43709 Chronic migraine without aura, not intractable, without status migrainosus: Secondary | ICD-10-CM

## 2024-04-18 DIAGNOSIS — G44209 Tension-type headache, unspecified, not intractable: Secondary | ICD-10-CM

## 2024-04-18 LAB — GLUCOSE, POCT (MANUAL RESULT ENTRY): POC Glucose: 135 mg/dL — AB (ref 70–99)

## 2024-04-18 MED ORDER — SUMATRIPTAN SUCCINATE 50 MG PO TABS: 50.0000 mg | ORAL_TABLET | Freq: Every day | ORAL | 0 refills | Status: AC | PRN
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
## 2024-04-19 LAB — MICROALBUMIN / CREATININE URINE RATIO
Creatinine, Urine: 76.7 mg/dL
Microalb/Creat Ratio: 9 mg/g{creat} (ref 0–29)
Microalbumin, Urine: 6.9 ug/mL

## 2024-04-23 ENCOUNTER — Telehealth: Payer: Self-pay

## 2024-04-23 ENCOUNTER — Other Ambulatory Visit: Payer: Self-pay

## 2024-04-23 NOTE — Telephone Encounter (Signed)
 Spoke with  pharmacist tam that we already sent doxycycline ,pantoprazole  and prednisone  in 1st week of December

## 2024-04-28 NOTE — Progress Notes (Unsigned)
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   LIPASE 47 12/10/2023    Electrolytes Lab Results  Component Value Date   NA 137 12/14/2023   K 4.1 12/14/2023   CL 103 12/14/2023   CALCIUM  9.2 12/14/2023   MG 2.7 (H) 12/12/2023    Bone Lab Results  Component Value Date   VD25OH 23.0 (L) 06/02/2023    Inflammation (CRP: Acute Phase) (ESR: Chronic Phase) Lab Results  Component Value Date   CRP 20.8 (H) 12/11/2023   ESRSEDRATE 43 (H) 03/25/2019   LATICACIDVEN 1.1 12/10/2023         Note: Above Lab results reviewed.  Recent Imaging Review  CT HEAD WO CONTRAST ( ) CLINICAL DATA:  Initial evaluation for acute onset headache.  EXAM: CT HEAD WITHOUT CONTRAST  TECHNIQUE: Contiguous axial images were obtained from the base of the skull through the vertex without intravenous contrast.  RADIATION DOSE REDUCTION: This exam was performed according to the departmental dose-optimization program which includes  automated exposure control, adjustment of the mA and/or kV according to patient size and/or use of iterative reconstruction technique.  COMPARISON:  Comparison made with prior CT from 10/30/2019  FINDINGS: Brain: Cerebral volume within normal limits. No acute intracranial hemorrhage. No acute large vessel territory infarct. No mass lesion or midline shift. No hydrocephalus or extra-axial fluid collection.  Vascular: No abnormal hyperdense vessel.  Skull: Scalp soft tissues within normal limits.  Calvarium intact.  Sinuses/Orbits: Globes orbital soft tissues within normal limits. Small left maxillary sinus retention cyst noted. Paranasal sinuses are otherwise clear. Mastoid air cells are clear.  Other: None.  IMPRESSION: Normal head CT. No acute intracranial abnormality.  Electronically Signed  By: Morene Hoard M.D.   On: 12/11/2023 23:30 ECHOCARDIOGRAM COMPLETE    ECHOCARDIOGRAM REPORT       Patient Name:   MARKIAH JANEWAY Date of Exam: 12/11/2023 Medical Rec #:  [id number]       Height:       65.0 in Accession #:    [id number]      Weight:       244.9 lb Date of Birth:  [birth year]       BSA:          2.156 m Patient Age:    63 years        BP:           121/75 mmHg Patient Gender: F               HR:           99 bpm. Exam Location:  [facility]  Procedure: 2D Echo, Cardiac Doppler and Color Doppler (Both Spectral and Color            Flow Doppler were utilized during procedure).  Indications:     CHF-Acute Diastolic I50.31   History:         Patient has prior history of Echocardiogram examinations, most                  recent 03/20/2019. COPD and CKD, stage 3; Risk                  Factors:Hypertension, Sleep Apnea, Diabetes, Dyslipidemia and                  Current Smoker.   Sonographer:     Thea Norlander RCS Referring Phys:  [id number] CORT ONEIDA MANA Diagnosing Phys: Deatrice Cage MD  IMPRESSIONS   1. Left ventricular ejection fraction, by estimation, is  55 to 60%. The left ventricle has normal function. The left ventricle has no regional wall motion abnormalities. There is mild left ventricular hypertrophy. Left ventricular diastolic parameters  are consistent with Grade I diastolic dysfunction (impaired relaxation).  2. Right ventricular systolic function is normal. The right ventricular size is normal. There is normal pulmonary artery systolic pressure.  3. The mitral valve is normal in structure. No evidence of mitral valve regurgitation. No evidence of mitral stenosis.  4. The aortic valve is normal in structure. Aortic valve regurgitation is not visualized. No aortic stenosis is present.  5. The inferior vena cava is normal in size with greater than 50% respiratory variability, suggesting right atrial pressure of 3 mmHg.  FINDINGS  Left Ventricle: Left ventricular ejection fraction, by estimation, is 55 to 60%. The left ventricle has normal function. The left ventricle has no regional wall motion abnormalities. The left ventricular internal cavity size was normal in size. There is  mild left ventricular hypertrophy. Left ventricular diastolic parameters are consistent with Grade I diastolic dysfunction (impaired relaxation).  Right Ventricle: The right ventricular size is normal. No increase in right ventricular wall thickness. Right ventricular systolic function is normal. There is normal pulmonary artery systolic pressure. The tricuspid regurgitant velocity is 2.25 m/s, and  with an assumed right atrial pressure of 5 mmHg, the estimated right ventricular systolic pressure is 25.2 mmHg.  Left Atrium: Left atrial size was normal in size.  Right Atrium: Right atrial size was normal in size.  Pericardium: There is no evidence of pericardial effusion.  Mitral Valve: The mitral valve is normal in structure. No evidence of mitral valve  regurgitation. No evidence of mitral valve stenosis.  Tricuspid Valve: The tricuspid valve is normal in  structure. Tricuspid valve regurgitation is trivial. No evidence of tricuspid stenosis.  Aortic Valve: The aortic valve is normal in structure. Aortic valve regurgitation is not visualized. No aortic stenosis is present. Aortic valve peak gradient measures 16.2 mmHg.  Pulmonic Valve: The pulmonic valve was normal in structure. Pulmonic valve regurgitation is not visualized. No evidence of pulmonic stenosis.  Aorta: The aortic root is normal in size and structure.  Venous: The inferior vena cava is normal in size with greater than 50% respiratory variability, suggesting right atrial pressure of 3 mmHg.  IAS/Shunts: No atrial level shunt detected by color flow Doppler.    LEFT VENTRICLE PLAX 2D LVIDd:         4.30 cm   Diastology LVIDs:         2.90 cm   LV e' medial:    7.72 cm/s LV PW:         1.10 cm   LV E/e' medial:  10.3 LV IVS:        1.00 cm   LV e' lateral:   10.60 cm/s LVOT diam:     2.00 cm   LV E/e' lateral: 7.5 LV SV:         55 LV SV Index:   25 LVOT Area:     3.14 cm    RIGHT VENTRICLE             IVC RV S prime:     20.30 cm/s  IVC diam: 1.75 cm TAPSE (M-mode): 2.0 cm  LEFT ATRIUM           Index        RIGHT ATRIUM           Index LA diam:      2.80 cm 1.30 cm/m   RA Area:     15.90 cm LA Vol (A2C): 17.5 ml 8.12 ml/m   RA Volume:   43.40 ml  20.13 ml/m LA Vol (A4C): 46.2 ml 21.43 ml/m  AORTIC VALVE AV Area (Vmax): 1.84 cm AV Vmax:        201.00 cm/s AV Peak Grad:   16.2 mmHg LVOT Vmax:      118.00 cm/s LVOT Vmean:     72.800 cm/s LVOT VTI:       0.175 m   AORTA Ao Root diam: 3.20 cm Ao Asc diam:  3.10 cm  MITRAL VALVE               TRICUSPID VALVE MV Area (PHT): 3.50 cm    TR Peak grad:   20.2 mmHg MV Decel Time: 217 msec    TR Vmax:        225.00 cm/s MV E velocity: 79.70 cm/s MV A velocity: 93.80 cm/s  SHUNTS MV E/A ratio:  0.85        Systemic VTI:  0.18 m                            Systemic Diam: 2.00 cm  Deatrice Cage  MD Electronically signed by Deatrice Cage MD Signature Date/Time: 12/11/2023/1:21:47 PM      Final   DG Chest 1 View CLINICAL DATA:  [id number].  CHF follow-up.  EXAM: CHEST  1 VIEW  COMPARISON:  Portable chest yesterday at 9:19 a.m., CTA chest yesterday 10:31 p.m.  FINDINGS: 7:11 a.m. cardiomegaly. Interval increased vascular  engorgement and central edema.  Small pleural effusions are beginning to form. There is perihilar airspace disease which is most likely alveolar edema.  Underlying pneumonia would be difficult to exclude but is less likely. The mediastinum is stable.  There is calcification in the transverse aorta. No new osseous finding.  IMPRESSION: 1. Cardiomegaly with increased vascular engorgement and central edema. 2. Small pleural effusions are beginning to form. 3. Perihilar airspace disease most likely alveolar edema. Underlying pneumonia would be difficult to exclude but is less likely.  Electronically Signed   By: Francis Quam M.D.   On: 12/11/2023 07:32 Note: Reviewed        Physical Exam  Vitals: There were no vitals taken for this visit. BMI: Estimated body mass index is 40.77 kg/m as calculated from the following:   Height as of 04/18/24: 5' 6 (1.676 m).   Weight as of 04/18/24: 252 lb 9.6 oz (114.6 kg). Ideal: Ideal body weight: 59.3 kg (130 lb 11.7 oz) Adjusted ideal body weight: 81.4 kg (179 lb 7.7 oz) General appearance: Well nourished, well developed, and well hydrated. In no apparent acute distress Mental status: Alert, oriented x 3 (person, place, & time)       Respiratory: No evidence of acute respiratory distress Eyes: PERLA   Assessment   Diagnosis Status  No diagnosis found. Controlled Controlled Controlled   Updated Problems: No problems updated.  Plan of Care  Problem-specific:  Assessment and Plan            Ms. JASHAYLA GLATFELTER has a current medication list which includes the following long-term medication(s):  albuterol , allopurinol , atorvastatin , bupropion , cetirizine , eliquis , famotidine , iron, furosemide , gabapentin , lantus  solostar, ipratropium-albuterol , linaclotide , lisinopril , metoprolol  tartrate, montelukast , pantoprazole , sumatriptan , and topiramate .  Pharmacotherapy (Medications Ordered): No orders of the defined types were placed in this encounter.  Orders:  No orders of the defined types were placed in this encounter.    {There is no content from the last Plan section.}   No follow-ups on file.    Recent Visits Date Type Provider Dept  04/01/24 Office Visit Tija Biss K, NP [facility]  03/07/24 Office Visit Dijuan Sleeth K, NP [facility]  02/29/24 Office Visit Marcelino Nurse, MD [facility]  Showing recent visits within past 90 days and meeting all other requirements Future Appointments Date Type Provider Dept  04/29/24 Appointment Darrell Hauk K, NP [facility]  Showing future appointments within next 90 days and meeting all other requirements  I discussed the assessment and treatment plan with the patient. The patient was provided an opportunity to ask questions and all were answered. The patient agreed with the plan and demonstrated an understanding of the instructions.  Patient advised to call back or seek an in-person evaluation if the symptoms or condition worsens.  Duration of encounter: *** minutes.  Total time on encounter, as per AMA guidelines included both the face-to-face and non-face-to-face time personally spent by the physician and/or other qualified health care professional(s) on the day of the encounter (includes time in activities that require the physician or other qualified health care professional and does not include time in activities normally performed by clinical staff). Physician's time may include the following activities when performed: Preparing to see the patient (e.g., pre-charting review of records,  searching for previously ordered imaging, lab work, and nerve conduction tests) Review of prior analgesic pharmacotherapies. Reviewing PMP Interpreting ordered tests (e.g., lab work, imaging, nerve conduction tests) Performing post-procedure evaluations, including  interpretation of diagnostic procedures Obtaining and/or reviewing separately obtained history Performing a medically appropriate examination and/or evaluation Counseling and educating the patient/family/caregiver Ordering medications, tests, or procedures Referring and communicating with other health care professionals (when not separately reported) Documenting clinical information in the electronic or other health record Independently interpreting results (not separately reported) and communicating results to the patient/ family/caregiver Care coordination (not separately reported)  Note by: Maryem Shuffler K Rhylen Shaheen, NP (TTS and AI technology used. I apologize for any typographical errors that were not detected and corrected.) Date: 04/29/2024; Time: 12:08 PM

## 2024-04-29 ENCOUNTER — Other Ambulatory Visit: Payer: Self-pay

## 2024-04-29 ENCOUNTER — Ambulatory Visit: Attending: Nurse Practitioner | Admitting: Nurse Practitioner

## 2024-04-29 ENCOUNTER — Telehealth: Payer: Self-pay

## 2024-04-29 ENCOUNTER — Encounter: Payer: Self-pay | Admitting: Nurse Practitioner

## 2024-04-29 VITALS — BP 166/79 | HR 78 | Temp 97.2°F | Resp 18 | Ht 65.0 in | Wt 247.0 lb

## 2024-04-29 DIAGNOSIS — M47816 Spondylosis without myelopathy or radiculopathy, lumbar region: Secondary | ICD-10-CM | POA: Insufficient documentation

## 2024-04-29 DIAGNOSIS — G894 Chronic pain syndrome: Secondary | ICD-10-CM | POA: Insufficient documentation

## 2024-04-29 DIAGNOSIS — G8929 Other chronic pain: Secondary | ICD-10-CM | POA: Diagnosis present

## 2024-04-29 DIAGNOSIS — Z7901 Long term (current) use of anticoagulants: Secondary | ICD-10-CM | POA: Diagnosis not present

## 2024-04-29 DIAGNOSIS — M5416 Radiculopathy, lumbar region: Secondary | ICD-10-CM | POA: Diagnosis not present

## 2024-04-29 DIAGNOSIS — Z79899 Other long term (current) drug therapy: Secondary | ICD-10-CM | POA: Diagnosis not present

## 2024-04-29 MED ORDER — HYDROCODONE-ACETAMINOPHEN 5-325 MG PO TABS: 1.0000 | ORAL_TABLET | Freq: Three times a day (TID) | ORAL | 0 refills | Status: DC | PRN

## 2024-04-29 MED ORDER — TIRZEPATIDE 12.5 MG/0.5ML ~~LOC~~ SOAJ: 12.5000 mg | SUBCUTANEOUS | 1 refills | Status: AC

## 2024-04-29 NOTE — Patient Instructions (Addendum)
 GENERAL RISKS AND COMPLICATIONS  What are the risk, side effects and possible complications? Generally speaking, most procedures are safe.  However, with any procedure there are risks, side effects, and the possibility of complications.  The risks and complications are dependent upon the sites that are lesioned, or the type of nerve block to be performed.  The closer the procedure is to the spine, the more serious the risks are.  Great care is taken when placing the radio frequency needles, block needles or lesioning probes, but sometimes complications can occur. Infection: Any time there is an injection through the skin, there is a risk of infection.  This is why sterile conditions are used for these blocks.  There are four possible types of infection. Localized skin infection. Central Nervous System Infection-This can be in the form of Meningitis, which can be deadly. Epidural Infections-This can be in the form of an epidural abscess, which can cause pressure inside of the spine, causing compression of the spinal cord with subsequent paralysis. This would require an emergency surgery to decompress, and there are no guarantees that the patient would recover from the paralysis. Discitis-This is an infection of the intervertebral discs.  It occurs in about 1% of discography procedures.  It is difficult to treat and it may lead to surgery.        2. Pain: the needles have to go through skin and soft tissues, will cause soreness.       3. Damage to internal structures:  The nerves to be lesioned may be near blood vessels or    other nerves which can be potentially damaged.       4. Bleeding: Bleeding is more common if the patient is taking blood thinners such as  aspirin , Coumadin , Ticiid, Plavix, etc., or if he/she have some genetic predisposition  such as hemophilia. Bleeding into the spinal canal can cause compression of the spinal  cord with subsequent paralysis.  This would require an emergency  surgery to  decompress and there are no guarantees that the patient would recover from the  paralysis.       5. Pneumothorax:  Puncturing of a lung is a possibility, every time a needle is introduced in  the area of the chest or upper back.  Pneumothorax refers to free air around the  collapsed lung(s), inside of the thoracic cavity (chest cavity).  Another two possible  complications related to a similar event would include: Hemothorax and Chylothorax.   These are variations of the Pneumothorax, where instead of air around the collapsed  lung(s), you may have blood or chyle, respectively.       6. Spinal headaches: They may occur with any procedures in the area of the spine.       7. Persistent CSF (Cerebro-Spinal Fluid) leakage: This is a rare problem, but may occur  with prolonged intrathecal or epidural catheters either due to the formation of a fistulous  track or a dural tear.       8. Nerve damage: By working so close to the spinal cord, there is always a possibility of  nerve damage, which could be as serious as a permanent spinal cord injury with  paralysis.       9. Death:  Although rare, severe deadly allergic reactions known as Anaphylactic  reaction can occur to any of the medications used.      10. Worsening of the symptoms:  We can always make thing worse.  What are the chances  of something like this happening? Chances of any of this occuring are extremely low.  By statistics, you have more of a chance of getting killed in a motor vehicle accident: while driving to the hospital than any of the above occurring .  Nevertheless, you should be aware that they are possibilities.  In general, it is similar to taking a shower.  Everybody knows that you can slip, hit your head and get killed.  Does that mean that you should not shower again?  Nevertheless always keep in mind that statistics do not mean anything if you happen to be on the wrong side of them.  Even if a procedure has a 1 (one) in a  1,000,000 (million) chance of going wrong, it you happen to be that one..Also, keep in mind that by statistics, you have more of a chance of having something go wrong when taking medications.  Who should not have this procedure? If you are on a blood thinning medication (e.g. Coumadin , Plavix, see list of Blood Thinners), or if you have an active infection going on, you should not have the procedure.  If you are taking any blood thinners, please inform your physician.  How should I prepare for this procedure? Do not eat or drink anything at least six hours prior to the procedure. Bring a driver with you .  It cannot be a taxi. Come accompanied by an adult that can drive you back, and that is strong enough to help you if your legs get weak or numb from the local anesthetic. Take all of your medicines the morning of the procedure with just enough water to swallow them. If you have diabetes, make sure that you are scheduled to have your procedure done first thing in the morning, whenever possible. If you have diabetes, take only half of your insulin  dose and notify our nurse that you have done so as soon as you arrive at the clinic. If you are diabetic, but only take blood sugar pills (oral hypoglycemic), then do not take them on the morning of your procedure.  You may take them after you have had the procedure. Do not take aspirin  or any aspirin -containing medications, at least eleven (11) days prior to the procedure.  They may prolong bleeding. Wear loose fitting clothing that may be easy to take off and that you would not mind if it got stained with Betadine or blood. Do not wear any jewelry or perfume Remove any nail coloring.  It will interfere with some of our monitoring equipment.  NOTE: Remember that this is not meant to be interpreted as a complete list of all possible complications.  Unforeseen problems may occur.  BLOOD THINNERS The following drugs contain aspirin  or other products,  which can cause increased bleeding during surgery and should not be taken for 2 weeks prior to and 1 week after surgery.  If you should need take something for relief of minor pain, you may take acetaminophen  which is found in Tylenol ,m Datril, Anacin-3 and Panadol. It is not blood thinner. The products listed below are.  Do not take any of the products listed below in addition to any listed on your instruction sheet.  A.P.C or A.P.C with Codeine  Codeine  Phosphate Capsules #3 Ibuprofen Ridaura  ABC compound Congesprin Imuran rimadil  Advil Cope Indocin Robaxisal  Alka-Seltzer Effervescent Pain Reliever and Antacid Coricidin or Coricidin-D  Indomethacin Rufen  Alka-Seltzer plus Cold Medicine Cosprin Ketoprofen S-A-C Tablets  Anacin Analgesic Tablets or Capsules Coumadin   Korlgesic Salflex  Anacin Extra Strength Analgesic tablets or capsules CP-2 Tablets Lanoril Salicylate  Anaprox Cuprimine Capsules Levenox Salocol  Anexsia-D Dalteparin Magan Salsalate  Anodynos Darvon compound Magnesium Salicylate Sine-off  Ansaid Dasin Capsules Magsal Sodium Salicylate  Anturane Depen Capsules Marnal Soma  APF Arthritis pain formula Dewitt's Pills Measurin  Stanback  Argesic Dia-Gesic Meclofenamic Sulfinpyrazone  Arthritis Bayer Timed Release Aspirin  Diclofenac Meclomen Sulindac  Arthritis pain formula Anacin Dicumarol Medipren Supac  Analgesic (Safety coated) Arthralgen Diffunasal Mefanamic Suprofen  Arthritis Strength Bufferin Dihydrocodeine Mepro Compound Suprol  Arthropan liquid Dopirydamole Methcarbomol with Aspirin  Synalgos  ASA tablets/Enseals Disalcid Micrainin Tagament  Ascriptin Doan's Midol Talwin  Ascriptin A/D Dolene Mobidin Tanderil  Ascriptin Extra Strength Dolobid Moblgesic Ticlid  Ascriptin with Codeine  Doloprin or Doloprin with Codeine  Momentum Tolectin  Asperbuf Duoprin Mono-gesic Trendar  Aspergum Duradyne Motrin or Motrin IB Triminicin  Aspirin  plain, buffered or enteric coated  Durasal Myochrisine Trigesic  Aspirin  Suppositories Easprin Nalfon Trillsate  Aspirin  with Codeine  Ecotrin Regular or Extra Strength Naprosyn Uracel  Atromid-S Efficin Naproxen Ursinus  Auranofin Capsules Elmiron Neocylate Vanquish  Axotal Emagrin Norgesic Verin  Azathioprine Empirin or Empirin with Codeine  Normiflo Vitamin E  Azolid Emprazil Nuprin Voltaren  Bayer Aspirin  plain, buffered or children's or timed BC Tablets or powders Encaprin Orgaran Warfarin Sodium   Buff-a-Comp Enoxaparin Orudis Zorpin  Buff-a-Comp with Codeine  Equegesic Os-Cal-Gesic   Buffaprin Excedrin plain, buffered or Extra Strength Oxalid   Bufferin Arthritis Strength Feldene Oxphenbutazone   Bufferin plain or Extra Strength Feldene Capsules Oxycodone  with Aspirin    Bufferin with Codeine  Fenoprofen Fenoprofen Pabalate or Pabalate-SF   Buffets II Flogesic Panagesic   Buffinol plain or Extra Strength Florinal or Florinal with Codeine  Panwarfarin   Buf-Tabs Flurbiprofen Penicillamine   Butalbital Compound Four-way cold tablets Penicillin   Butazolidin Fragmin Pepto-Bismol   Carbenicillin Geminisyn Percodan   Carna Arthritis Reliever Geopen Persantine   Carprofen Gold's salt Persistin   Chloramphenicol Goody's Phenylbutazone   Chloromycetin Haltrain Piroxlcam   Clmetidine heparin Plaquenil   Cllnoril Hyco-pap Ponstel   Clofibrate Hydroxy chloroquine Propoxyphen         Before stopping any of these medications, be sure to consult the physician who ordered them.  Some, such as Coumadin  (Warfarin) are ordered to prevent or treat serious conditions such as deep thrombosis, pumonary embolisms, and other heart problems.  The amount of time that you may need off of the medication may also vary with the medication and the reason for which you were taking it.  If you are taking any of these medications, please make sure you notify your pain physician before you undergo any procedures.         Moderate Conscious  Sedation, Adult Sedation is the use of medicines to help you relax and not feel pain. Moderate conscious sedation is a type of sedation that makes you less alert than normal. You are still able to respond to instructions, touch, or both. This type of sedation is used during short medical and dental procedures. It is milder than deep sedation, which is a type of sedation you cannot be easily woken up from. It is also milder than general anesthesia, which is the use of medicines to make you fall asleep. Moderate conscious sedation lets you return to your normal activities sooner. Tell a health care provider about: Any allergies you have. All medicines you are taking, including vitamins, herbs, steroids, eye drops, creams, and over-the-counter medicines. Any problems you or family members have had with anesthesia.  Any bleeding problems you have. Any surgeries you have had. Any medical conditions you have. Whether you are pregnant or may be pregnant. Any recent alcohol, tobacco, or drug use. What are the risks? Your health care provider will talk with you about risks. These may include: Oversedation. This is when you get too much medicine. Nausea or vomiting. Allergic reaction to medicines. Trouble breathing. If this happens, a breathing tube may be used. It will be removed when you can breathe better on your own. Heart trouble. Lung trouble. Emergence delirium. This is when you feel confused while the sedation wears off. This gets better with time. What happens before the procedure? When to stop eating and drinking Follow instructions from your health care provider about what you may eat and drink. These may include: 8 hours before your procedure Stop eating most foods. Do not eat meat, fried foods, or fatty foods. Eat only light foods, such as toast or crackers. All liquids are okay except energy drinks and alcohol. 6 hours before your procedure Stop eating. Drink only clear liquids, such  as water, clear fruit juice, black coffee, plain tea, and sports drinks. Do not drink energy drinks or alcohol. 2 hours before your procedure Stop drinking all liquids. You may be allowed to take medicines with small sips of water. If you do not follow your health care provider's instructions, your procedure may be delayed or canceled. Medicines Ask your health care provider about: Changing or stopping your regular medicines. These include any diabetes medicines or blood thinners you take. Taking medicines such as aspirin  and ibuprofen. These medicines can thin your blood. Do not take them unless your health care provider tells you to. Taking over-the-counter medicines, vitamins, herbs, and supplements. Tests and exams You may have an exam or testing. You may have a blood or urine sample taken. General instructions Do not use any products that contain nicotine or tobacco for at least 4 weeks before the procedure. These products include cigarettes, chewing tobacco, and vaping devices, such as e-cigarettes. If you need help quitting, ask your health care provider. If you will be going home right after the procedure, plan to have a responsible adult: Take you home from the hospital or clinic. You will not be allowed to drive. Care for you for the time you are told. What happens during the procedure?  You will be given the sedative. It may be given: As a pill you can take by mouth. It can also be put into the rectum. As a spray through the nose. As an injection into muscle. As an injection into a vein through an IV. You may be given oxygen as needed. Your blood pressure, heart rate, breathing rate, and blood oxygen level will be monitored during the procedure. The medical or dental procedure will be done. The procedure may vary among health care providers and hospitals. What happens after the procedure? Your blood pressure, heart rate, breathing rate, and blood oxygen level will be  monitored until you leave the hospital or clinic. You will get fluids through an IV as needed. Do not drive or operate machinery until your health care provider says that it is safe. This information is not intended to replace advice given to you by your health care provider. Make sure you discuss any questions you have with your health care provider. Document Revised: 11/01/2021 Document Reviewed: 11/01/2021 Elsevier Patient Education  2024 [employer]Facet Blocks Patient Information  Description: The facets are joints in the spine between the  vertebrae.  Like any joints in the body, facets can become irritated and painful.  Arthritis can also effect the facets.  By injecting steroids and local anesthetic in and around these joints, we can temporarily block the nerve supply to them.  Steroids act directly on irritated nerves and tissues to reduce selling and inflammation which often leads to decreased pain.  Facet blocks may be done anywhere along the spine from the neck to the low back depending upon the location of your pain.   After numbing the skin with local anesthetic (like Novocaine), a small needle is passed onto the facet joints under x-ray guidance.  You may experience a sensation of pressure while this is being done.  The entire block usually lasts about 15-25 minutes.   Conditions which may be treated by facet blocks:  Low back/buttock pain Neck/shoulder pain Certain types of headaches  Preparation for the injection:  Do not eat any solid food or dairy products within 8 hours of your appointment. You may drink clear liquid up to 3 hours before appointment.  Clear liquids include water, black coffee, juice or soda.  No milk or cream please. You may take your regular medication, including pain medications, with a sip of water before your appointment.  Diabetics should hold regular insulin  (if taken separately) and take 1/2 normal NPH dose the morning of the procedure.  Carry some  sugar containing items with you to your appointment. A driver must accompany you and be prepared to drive you home after your procedure. Bring all your current medications with you. An IV may be inserted and sedation may be given at the discretion of the physician. A blood pressure cuff, EKG and other monitors will often be applied during the procedure.  Some patients may need to have extra oxygen administered for a short period. You will be asked to provide medical information, including your allergies and medications, prior to the procedure.  We must know immediately if you are taking blood thinners (like Coumadin Patsi) or if you are allergic to IV iodine contrast (dye).  We must know if you could possible be pregnant.  Possible side-effects:  Bleeding from needle site Infection (rare, may require surgery) Nerve injury (rare) Numbness & tingling (temporary) Difficulty urinating (rare, temporary) Spinal headache (a headache worse with upright posture) Light-headedness (temporary) Pain at injection site (serveral days) Decreased blood pressure (rare, temporary) Weakness in arm/leg (temporary) Pressure sensation in back/neck (temporary)   Call if you experience:  Fever/chills associated with headache or increased back/neck pain Headache worsened by an upright position New onset, weakness or numbness of an extremity below the injection site Hives or difficulty breathing (go to the emergency room) Inflammation or drainage at the injection site(s) Severe back/neck pain greater than usual New symptoms which are concerning to you  Please note:  Although the local anesthetic injected can often make your back or neck feel good for several hours after the injection, the pain will likely return. It takes 3-7 days for steroids to work.  You may not notice any pain relief for at least one week.  If effective, we will often do a series of 2-3 injections spaced 3-6 weeks apart to maximally  decrease your pain.  After the initial series, you may be a candidate for a more permanent nerve block of the facets.  If you have any questions, please call #[phone] [facility] ______________________________________________________________________    Opioid Pain Medication Update  To: All patients  taking opioid pain medications. (I.e.: hydrocodone , hydromorphone , oxycodone , oxymorphone, morphine , codeine , methadone, tapentadol, tramadol , buprenorphine , fentanyl , etc.)  Re: Updated review of side effects and adverse reactions of opioid analgesics, as well as new information about long term effects of this class of medications.  Direct risks of long-term opioid therapy are not limited to opioid addiction and overdose. Potential medical risks include serious fractures, breathing problems during sleep, hyperalgesia, immunosuppression, chronic constipation, bowel obstruction, myocardial infarction, and tooth decay secondary to xerostomia.  Unpredictable adverse effects that can occur even if you take your medication correctly: Cognitive impairment, respiratory depression, and death. Most people think that if they take their medication correctly, and as instructed, that they will be safe. Nothing could be farther from the truth. In reality, a significant amount of recorded deaths associated with the use of opioids has occurred in individuals that had taken the medication for a long time, and were taking their medication correctly. The following are examples of how this can happen: Patient taking his/her medication for a long time, as instructed, without any side effects, is given a certain antibiotic or another unrelated medication, which in turn triggers a Drug-to-drug interaction leading to disorientation, cognitive impairment, impaired reflexes, respiratory depression or an untoward event leading to serious bodily harm or injury, including death.  Patient  taking his/her medication for a long time, as instructed, without any side effects, develops an acute impairment of liver and/or kidney function. This will lead to a rapid inability of the body to breakdown and eliminate their pain medication, which will result in effects similar to an overdose, but with the same medicine and dose that they had always taken. This again may lead to disorientation, cognitive impairment, impaired reflexes, respiratory depression or an untoward event leading to serious bodily harm or injury, including death.  A similar problem will occur with patients as they grow older and their liver and kidney function begins to decrease as part of the aging process.  Background information: Historically, the original case for using long-term opioid therapy to treat chronic noncancer pain was based on safety assumptions that subsequent experience has called into question. In 1996, the American Pain Society and the American Academy of Pain Medicine issued a consensus statement supporting long-term opioid therapy. This statement acknowledged the dangers of opioid prescribing but concluded that the risk for addiction was low; respiratory depression induced by opioids was short-lived, occurred mainly in opioid-naive patients, and was antagonized by pain; tolerance was not a common problem; and efforts to control diversion should not constrain opioid prescribing. This has now proven to be wrong. Experience regarding the risks for opioid addiction, misuse, and overdose in community practice has failed to support these assumptions.  According to the Centers for Disease Control and Prevention, fatal overdoses involving opioid analgesics have increased sharply over the past decade. Currently, more than 96,700 people die from drug overdoses every year. Opioids are a factor in 7 out of every 10 overdose deaths. Deaths from drug overdose have surpassed motor vehicle accidents as the leading cause of  death for individuals between the ages of 58 and 33.  Clinical data suggest that neuroendocrine dysfunction may be very common in both men and women, potentially causing hypogonadism, erectile dysfunction, infertility, decreased libido, osteoporosis, and depression. Recent studies linked higher opioid dose to increased opioid-related mortality. Controlled observational studies reported that long-term opioid therapy may be associated with increased risk for cardiovascular events. Subsequent meta-analysis concluded that the safety of long-term opioid therapy in elderly patients  has not been proven.   Side Effects and adverse reactions: Common side effects: Drowsiness (sedation). Dizziness. Nausea and vomiting. Constipation. Physical dependence -- Dependence often manifests with withdrawal symptoms when opioids are discontinued or decreased. Tolerance -- As you take repeated doses of opioids, you require increased medication to experience the same effect of pain relief. Respiratory depression -- This can occur in healthy people, especially with higher doses. However, people with COPD, asthma or other lung conditions may be even more susceptible to fatal respiratory impairment.  Uncommon side effects: An increased sensitivity to feeling pain and extreme response to pain (hyperalgesia). Chronic use of opioids can lead to this. Delayed gastric emptying (the process by which the contents of your stomach are moved into your small intestine). Muscle rigidity. Immune system and hormonal dysfunction. Quick, involuntary muscle jerks (myoclonus). Arrhythmia. Itchy skin (pruritus). Dry mouth (xerostomia).  Long-term side effects: Chronic constipation. Sleep-disordered breathing (SDB). Increased risk of bone fractures. Hypothalamic-pituitary-adrenal dysregulation. Increased risk of overdose.  RISKS: Respiratory depression and death: Opioids increase the risk of respiratory depression and death.   Drug-to-drug interactions: Opioids are relatively contraindicated in combination with benzodiazepines, sleep inducers, and other central nervous system depressants. Other classes of medications (i.e.: certain antibiotics and even over-the-counter medications) may also trigger or induce respiratory depression in some patients.  Medical conditions: Patients with pre-existing respiratory problems are at higher risk of respiratory failure and/or depression when in combination with opioid analgesics. Opioids are relatively contraindicated in some medical conditions such as central sleep apnea.   Fractures and Falls:  Opioids increase the risk and incidence of falls. This is of particular importance in elderly patients.  Endocrine System:  Long-term administration is associated with endocrine abnormalities (endocrinopathies). (Also known as Opioid-induced Endocrinopathy) Influences on both the hypothalamic-pituitary-adrenal axis?and the hypothalamic-pituitary-gonadal axis have been demonstrated with consequent hypogonadism and adrenal insufficiency in both sexes. Hypogonadism and decreased levels of dehydroepiandrosterone sulfate have been reported in men and women. Endocrine effects include: Amenorrhoea in women (abnormal absence of menstruation) Reduced libido in both sexes Decreased sexual function Erectile dysfunction in men Hypogonadisms (decreased testicular function with shrinkage of testicles) Infertility Depression and fatigue Loss of muscle mass Anxiety Depression Immune suppression Hyperalgesia Weight gain Anemia Osteoporosis Patients (particularly women of childbearing age) should avoid opioids. There is insufficient evidence to recommend routine monitoring of asymptomatic patients taking opioids in the long-term for hormonal deficiencies.  Immune System: Human studies have demonstrated that opioids have an immunomodulating effect. These effects are mediated via opioid receptors  both on immune effector cells and in the central nervous system. Opioids have been demonstrated to have adverse effects on antimicrobial response and anti-tumour surveillance. Buprenorphine  has been demonstrated to have no impact on immune function.  Opioid Induced Hyperalgesia: Human studies have demonstrated that prolonged use of opioids can lead to a state of abnormal pain sensitivity, sometimes called opioid induced hyperalgesia (OIH). Opioid induced hyperalgesia is not usually seen in the absence of tolerance to opioid analgesia. Clinically, hyperalgesia may be diagnosed if the patient on long-term opioid therapy presents with increased pain. This might be qualitatively and anatomically distinct from pain related to disease progression or to breakthrough pain resulting from development of opioid tolerance. Pain associated with hyperalgesia tends to be more diffuse than the pre-existing pain and less defined in quality. Management of opioid induced hyperalgesia requires opioid dose reduction.  Cancer: Chronic opioid therapy has been associated with an increased risk of cancer among noncancer patients with chronic pain. This association was more  evident in chronic strong opioid users. Chronic opioid consumption causes significant pathological changes in the small intestine and colon. Epidemiological studies have found that there is a link between opium  dependence and initiation of gastrointestinal cancers. Cancer is the second leading cause of death after cardiovascular disease. Chronic use of opioids can cause multiple conditions such as GERD, immunosuppression and renal damage as well as carcinogenic effects, which are associated with the incidence of cancers.   Mortality: Long-term opioid use has been associated with increased mortality among patients with chronic non-cancer pain (CNCP).  Prescription of long-acting opioids for chronic noncancer pain was associated with a significantly increased  risk of all-cause mortality, including deaths from causes other than overdose.  Reference: Von Korff M, Kolodny A, Deyo RA, Chou R. Long-term opioid therapy reconsidered. Ann Intern Med. 2011 Sep 6;155(5):325-8. doi: 10.7326/[id number]. PMID: 78106373; PMCID: [id number]. Kit JINNY Laurence CINDERELLA Pearley JINNY, Hayward RA, Dunn KM, Jordan KP. Risk of adverse events in patients prescribed long-term opioids: A cohort study in the [place] Clinical Practice Research [employer]. Eur J Pain. 2019 May;23(5):908-922. doi: 10.1002/ejp.1357. Epub 2019 Jan 31. PMID: 69379883. Colameco S, Coren JS, Ciervo CA. Continuous opioid treatment for chronic noncancer pain: a time for moderation in prescribing. Postgrad Med. 2009 Jul;121(4):61-6. doi: 10.3810/pgm.2009.07.2032. PMID: 80358728. Gigi JONELLE Shlomo MILUS Levern IVER Conny RN, [place] SD, Blazina I, Lonell DASEN, Bougatsos C, Deyo RA. The effectiveness and risks of long-term opioid therapy for chronic pain: a systematic review for a [employer] of Health Pathways to [employer]. Ann Intern Med. 2015 Feb 17;162(4):276-86. doi: 10.7326/M14-2559. PMID: 74418742. Rory CHRISTELLA Laurence [facility], [place] DM. NCHS Data Brief No. 22. [place]: Centers for Disease Control and Prevention; 2009. Sep, Increase in Fatal Poisonings Involving Opioid Analgesics in the [place] , 1999-2006. Song IA, Choi HR, Oh TK. Long-term opioid use and mortality in patients with chronic non-cancer pain: Ten-year follow-up study in [place] from 2010 through 2019. EClinicalMedicine. 2022 Jul 18;51:101558. doi: 10.1016/j.eclinm.2022.898441. PMID: 64124182; PMCID: EFR0695089. Huser, W., Schubert, T., Vogelmann, T. et al. All-cause mortality in patients with long-term opioid therapy compared with non-opioid analgesics for chronic non-cancer pain: a database study. BMC Med 18, 162 (2020). [email or web address].[email or web address] Rashidian H, Zendehdel K, Kamangar F, Malekzadeh R, Haghdoost AA. An  Ecological Study of the Association between Opiate Use and Incidence of Cancers. Addict Health. 2016 Fall;8(4):252-260. PMID: 71180443; PMCID: [id number].  Our Goal: Our goal is to control your pain with means other than the use of opioid pain medications.  Our Recommendation: Talk to your physician about coming off of these medications. We can assist you with the tapering down and stopping these medicines. Based on the new information, even if you cannot completely stop the medication, a decrease in the dose may be associated with a lesser risk. Ask for other means of controlling the pain. Decrease or eliminate those factors that significantly contribute to your pain such as smoking, obesity, and a diet heavily tilted towards inflammatory nutrients.  Last Updated: 11/07/2022   ______________________________________________________________________       ______________________________________________________________________    Update on Controlled Substance (Opioid) Regulations   To: All patients taking opioid pain medications. (I.e.: hydrocodone , hydromorphone , oxycodone , oxymorphone, morphine , codeine , methadone, tapentadol, tramadol , buprenorphine , fentanyl , etc.)  Re: Review on the state of controlled substance regulations.  Introduction: Rules and regulations associated with all aspects of controlled substances are constantly being modified. Unfortunately we have encountered patients questioning the veracity of the information that we provide them about these changes. This is intended to provide  them with appropriate references and a historical review of these changes.  A Brief History: As of February 15, 2016, the [place]  Government declared the opioid epidemic a public health emergency. Prescription drug monitoring programs (PDMPs) and the [facility] All Schedules Prescription Electronic Reporting Act (NASPER). Before 1800, clinicians regarded pain as an existential phenomenon, a  consequence of aging. There was no regulation on the use of cocaine and opioids, resulting in widespread marketing and prescribing for many ailments ranging from diarrhea to toothache. The [employer] of [phone], passed in response to the sudden emergence of street heroin abuse as well as iatrogenic morphine  dependence, influenced both physician and patient alike to avoid opiates. Patients with unexplained pain in the 1920s were regarded as deluded, malingering, or abusers, and cancer patients through the 1950s were encouraged to wean themselves off opioids until their lives could be measured in weeks. Alongside this opioid evolution, the American Pain Society launched their influential pain as the fifth vital sign campaign in May 29, 1993. Concurrently, pharmaceutical companies introduced new formulations, such as extended release oxycodone  (OxyContin ). From 30-May-1995 to 05-29-00, OxyContin  prescriptions increased from 670,000 to 6.2 million. However, concerns soon began to surface regarding overzealous opioid treatment. It must be noted that pharmaceutical companies contributed significantly to the rise of the opioid epidemic, receiving considerable reprimands as a consequence. In 05-29-05, as the opioid epidemic began to inflict profound damage, [employer] pleaded guilty to federal charges related to the misbranding of OxyContin . [employer] agreed to pay a total of $634.5 million to resolve Justice Department investigations, as well as a $19.5 million settlement to [address] and the [address] of [place].  In response to the current epidemic, changes in focus to the development of new abuse deterrent opioid formulations at the [place]  Food and Drug Administration (FDA) as well as drafting of new public standards for pain treatment were created at [facility] in 2015-05-30. In response to the opioid epidemic, FDA public policy changes were announced in February 2016. Among these new positions were a re-examination of the risk-benefit  paradigm for opioids with strict emphasis on the large public health ramifications. The various modified opioids released over the past 20 years, such as tamper-resistant preparation, have had differing levels of success, and are collectively referred to as Risk Evaluation and Mitigation Strategies (REMS). There is also a growing focus on preventing opioid use disorder (OUD) and on offering affected individuals accessible and effective treatment. [place]  government policy reflects these changes and both the Affordable Care Act and the Mental Health Parity and Addiction Equity Act were major steps forward in treating opioid addiction. The Affordable Care Act, which was signed into law in 05-29-2008, with major provisions coming into effect by May 29, 2012.  In the 1990s, the intensified marketing of newly reformulated prescription opioid medications (e.g., OxyContin ) and an influential pain advocacy campaign that encouraged greater pain management led to a precipitous rise in opioid use in the [place] . Research from the Centers for Disease Control and Prevention (CDC) shows that prescription opioid sales in the [place]  quadrupled from 1997-05-29 to 29-May-2008. At the same time, opioid misuse and opioid-involved overdose deaths increased (Figure 1). Between 1997/05/29 and 05-29-2008, the rate of opioidinvolved overdose deaths in the [place]  doubled from 2.9 to 6.8 deaths per 100,000 people. This initial rise in opioid-related deaths is often referred to as the first wave of the recent opioid crisis.  Between May 29, 1997 and May 29, 2018, 565,000 Americans died of opioid-involved overdoses. In turn, federal, state, and local governments  responded with various legal and policy efforts to curb opioid misuse and drug-related overdose Deaths.  Recent Congresses have enacted several laws addressing the opioid crisis, such as the Comprehensive Addiction and Recovery Act of 2016 (CARA, P.L. [phone]); the 21st Century Cures Act (P.L.  114-255); the Substance UseDisorder Prevention that Promotes Opioid Recovery and Treatment for Patients and Communities Act (SUPPORT Act, P.L. 6315735619); the Fentanyl  Sanctions Act (Title LXXII of P.L. [id number]); and the Blocking Deadly Fentanyl  Imports Act (P.L. 117-81, 6610). These laws addressed overprescribing and misuse of opioids, expanded substance use disorder prevention and treatment capacities, bolstered drug diversion capabilities, and enhanced international drug interdiction, counternarcotics cooperation, and sanctions efforts. Congress also directed additional funds to many of these initiatives through appropriations.  Congress provided funding in the [employer] Act of 2021 [phone]; P.L. 117-2) for syringe services programs (often known as needle exchange programs) and other harm reduction initiatives. Federal and state harm reduction strategies have frequently involved the distribution of naloxone (e.g., Narcan)--a medication used to reverse an opioid overdose--and test strips used to detect fentanyl  in drug samples.  The Department of Justice (DOJ) and Department of Homeland Security [facility]) aim to reduce the diversion of prescription opioids and the use, manufacturing, and trafficking of illicit opioids. DOJ--via the Drug Enforcement Administration (DEA)--regulates opioid manufacturers, distributors, and dispensers; it also controls the opioid supply through enforcement of regulatory requirements.  A History of Opiate Laws in the [place]   Prior to 1890, laws concerning opiates were strictly imposed on a local city or state-by-state basis. One of the first was in [place] in 1875 where it became illegal to smoke opium  only in opium  dens. It did not ban the sale, import or use otherwise. In the next 25 years different states enacted opium  laws ranging from outlawing opium  dens altogether to making possession of opium , morphine  and heroin without a  physicians prescription illegal.  The first Congressional Act took place in 1890 that levied taxes on morphine  and opium . From that time on the [employer] has had a series of laws and acts directly aimed at opiate use, abuse and control. These are outlined below:  1906 - Pure Food and Drug Act Preventing the manufacture, sale, or transportation of adulterated or misbranded or poisonous or deleterious foods, drugs, medicines, and liquors, and for regulating traffic therein, and for other purposes. Punishment included fines and prison time.  1909   - Smoking Opium  Exclusion Act Banned the importation, possession and use of smoking opium . Did not regulate opium -based medications. First Freight forwarder banning the non-medical use of a substance.  1914  - The Margrette Act In summary, The Margrette Act of 1914 was written more to have all parties involved in importing, exporting, set designer and distributing opium  or cocaine to register with the [employer] and have taxes levied upon them. Exempt from the law were physicians operating in the course of his professional practice  1919 - Supreme Court ratified the Bj's in [place] et [place]., v. [place]  and [place]  v. [place], then again in [facility] v. [place] , in 1920, holding that doctors may not prescribe maintenance supplies of narcotics to people addicted to narcotics. However, it does not prohibit doctors from prescribing narcotics to wean a patient off of the drug. It was also the opinion of the court that prescribing narcotics to habitual users was not considered professional practice hence it then was considered illegal for doctors to prescribe opioids for the purposes  of maintaining an addiction. It can be argued that todays addiction medications are not intended to maintain an addiction but to facilitate addiction remission. In which case, this opinion of the court should not preclude  practitioners from prescribing buprenorphine  or methadone to patients suffering from an addictive disorder.  1924  - Heroin Act Architectural technologist, importation and possession of heroin illegal - even for medicinal use.  1922 -- Narcotic Drug Import and Export Act Enacted to assure proper control of importation, sale, possession, production and consumption of narcotics.  1927  -- Special Educational Needs Teacher of Prohibition [employer] of Prohibition was responsible for tracking bootleggers and organized conservation officer, historic buildings. They focused primarily on interstate and international cases and those cases where local law enforcement official would not or could not act.  1932 -- Uniform State Narcotic Act Encouraged states to pass uniform state laws matching the federal Narcotic Drug Import and Export Act. Suggested prohibiting cannabis use at the state level.  12 -- Food, Drug, and Cosmetic Act The new law brought cosmetics and medical devices under control, and it required that drugs be labeled with adequate directions for safe use. Moreover, it mandated pre-market approval of all new drugs, such that a manufacturer would have to prove to FDA that a drug were safe before it could be sold  1951 -- Boggs Act Imposed maximum criminal penalties for violations of the import/export and internal revenue laws related to drugs and also established mandatory minimum prison sentences.  1956 -- Narcotics Control Act Increased Boggs Act penalties and mandatory prison sentence minimums for violations of existing drug laws.  1965 -- Drug Abuse Control Amendment Enacted to deal with problems caused by abuse of depressants, stimulants and hallucinogens. Restricted research into psychoactive drugs such as LSD by requiring FDA approval.  1970 -- Controlled Substance Act  Controlled Substances Import and Export Act These laws are a consolidation of numerous laws regulating the manufacture and distribution of narcotics, stimulants,  depressants, hallucinogens, anabolic steroids, and chemicals used in the illicit production of controlled substances. The CSA places all substances that are regulated under existing federal law into one of five schedules. This placement is based upon the substance's medicinal value, harmfulness, and potential for abuse or addiction. Schedule I is reserved for the most dangerous drugs that have no recognized medical use, while Schedule V is the classification used for the least dangerous drugs. The act also provides a mechanism for substances to be controlled, added to a schedule, decontrolled, removed from control, rescheduled, or transferred from one schedule to another.  55 - Drug Enforcement Agency By Executive Order, the DEA was formed to take place of the [employer] of Narcotics and Dangerous Drugs.  50 -Narcotic Addict Treatment Act of  1974  - Public Law [phone] Amends the Controlled Substance Act of 1970 to provide for the registration of practitioners conducting narcotic treatment programs. [methadone clinics] It also provides legal definitions for the phrases maintenance treatment and detoxification treatment.  1986 -- Anti-Drug Abuse Act of 1986 Strengthened Federal efforts to encourage foreign cooperation in eradicating illicit drug crops and in halting international drug traffic, to improve enforcement of Federal drug laws and enhance interdiction of illicit drug shipments, to provide strong Market researcher in establishing effective drug abuse prevention and education programs, to [employer] support for drug abuse treatment and rehabilitation efforts, and for other purposes. It also re-imposed mandatory sentencing minimums depending on which drug and how much was involved.  1988 -- Anti-Drug Abuse Act of 1988 Established  the Office of [employer] (ONDCP) in the [employer] of the Economist; authorized funds for [employer], state and local drug enforcement  activities, school-based drug prevention efforts, and drug abuse treatment with special emphasis on injecting drug abusers at high risk for AIDS.  2000 -- Federal - The Drug Addiction Treatment Act of 2000 (DATA 2000) It enables qualified physicians to prescribe and/or dispense narcotics for the purpose of treating opioid dependency. For the first time, physicians are able to treat this disease from their private offices or other clinical settings. This presents a very desirable treatment option for those who are unwilling or unable to seek help in drug treatment clinics. Patients can now be treated in the privacy of their doctors office, as are other people being treated for any other type of medical condition. One medicine doctors may now prescribe is Buprenorphine . The major downfall of this Act is the limitation of 30 patients per practice - which means that large facilities, no matter how many physicians are there, can only treat 30 patients at a time.  2002-- DEA reschedules buprenorphine  from a schedule V drug to a schedule III drug, on February 05, 2001 - the day before the FDA approval of Suboxone and Subutex  despite overwhelming objection by the medical community.  2004: June 2004 THE CONFIDENTIALITY OF ALCOHOL AND DRUG ABUSE PATIENT RECORDS REGULATION AND THE HIPAA PRIVACY RULE:  Confidentiality of Alcohol and Drug Dependence Patient Records (summary) Code of Federal Regulations Title 42 Part 2 (42 CFR Part 2)  The confidentiality of alcohol and drug dependence patient records maintained by this practice/program is protected by federal law and regulations. Generally, the practice/program may not say to a person outside the practice/program that a patient attends the practice/program, or disclose any information identifying a patient as being alcohol or drug dependent unless:  The patient consents in writing; The disclosure is allowed by a court order, or The disclosure is made to medical  personnel in a medical emergency or to qualified personnel for research,  audit, or practice/program evaluation. Violation of the federal law and regulations by a practice/program is a crime. Suspected violations may be reported to appropriate authorities in accordance with federal regulations. Freight forwarder and regulations do not protect any information about a crime committed by a patient either at the practice/program or against any person who works for the practice/program or about any threat to commit such a crime. Federal laws and regulations do not protect any information about suspected child abuse or neglect from being reported under state law to appropriate state or local authorities.  sample consent form (MS-WORD)  2005: 12-02-2003 Public law [phone], Amends the Controlled Substances Act to eliminate the 30-patient limit for medical group practices allowed to dispense narcotic drugs in schedules III, IV, or V for maintenance or detoxification treatment (retains the 30-patient limit for an individual physician). This amendment removes the 30-patient limit on group medical practices that treat opioid dependence with buprenorphine . The restriction was part of the original Drug Addiction Treatment Act of 2000 (DATA) that allowed treatment of opioid dependence in a doctor's office. With this change, every certified doctor may now prescribe buprenorphine  up to his or her individual physician limit of 30 patients.  2006: On 04/29/2005 President Levy signed Bill H.R.6344 into law. This allows physicians who have been certified to prescribe certain drugs for the treatment of opioid dependence under DATA2000 to treat up to 100 patients (up from 30) by submitting an intent notification to the  Dept of Health and [employer]. This is a major step forward in both fighting the stigma and allowing access to treatment previously not available to some. For more details see 30/100-PATIENT LIMIT  2016: HHS  augments regulations concerning the 30/100 patient limit by raising the limit to 275 for qualifying physicians. Link to summary of regulation  2016: Comprehensive Addiction and Recovery Act of 2016 (sec.303) amends the Controlled Substance Act - to allow Nurse Practitioners and Physician Assistants to become eligible to prescribe buprenorphine  for the treatment of opioid use disorder. See the entire law for more details.  The roots of the concurrent regulation of certain drugs under two statutory schemes go back to the beginning of this century. In 1906, Congress enacted the Pure Food and Drug Act, establishing one regime of regulation to assure (among other things) that drugs were not adulterated or misbranded. These regulations were amended several times, recodified in 1938, and expanded on again from the 1940s through the 1990s. Their implementation and enforcement is today assigned to the Food and Drug Administration (FDA) in the Department of Health and Human Services [facility]).  In 1914, Congress adopted the Hughes Narcotic Act to stop abuse of addictive drugs. The Margrette Narcotic Act was amended in 1937 to include marijuana. In 1965, amphetamines, barbiturates, and hallucinogens came under regulation, but under the [employer], Drug, and Cosmetic Act. In 1970, these various statutes were consolidated and recodified as the Controlled Substances Act (CSA), which has been amended several times since then. Its implementation and enforcement is today assigned to the Drug Enforcement Administration (DEA) in the Department of Justice.  The first clash occurred after World War I, when so-called morphine  clinics existed and physicians prescribed or dispensed morphine  to addicts. Some addicts were veterans of the American Civil War, the Spanish-American War, and WWI, who had become addicted during treatment for war wounds, but most of them came from the growing population of nonmedical addicts ([place],  8017). The Narcotics Division of the Prohibition Unit of the Department of the Treasury, which was then responsible for enforcing the [facility] Narcotic Act, concluded that this activity was not the legitimate practice of medicine but simple drug trafficking. The Treasury Department swiftly closed the clinics and made it personally and professionally risky for physicians to maintain a narcotic addict for any reason. In did so, however, only after the American Medical Association had adopted a resolution, in 1920, opposing ambulatory clinics''.  In 1972, the public health establishment, including the Secretary of Health, Education, and Welfare, the Education Officer, Environmental, the [employer] of [employer], and the Chemical Engineer for Drug Abuse Prevention, was unprepared to allow [employer] of Narcotics and Dangerous Drugs, DEA's predecessor agency, to unilaterally define the parameters of medical practice for the use of methadone in the treatment of heroin addiction. As a consequence, a new set of rules--the third, on top of the FDA and DEA schemes--was added, one that inserted FDA deeply into the practice of medicine, notwithstanding its protestations to the contrary. Congress ratified this joint responsibility of law enforcement and public health officials for methadone through this third set of rules in 1974 with the passage of the Narcotic Addict Treatment Act (NATA). To examine in detail the evolution of this third set of rules--commonly referred to as the FDA or DHHS methadone regulations--we turn, first, to the period of the mid-1960s.  Increased use of heroin in the post World War II period first became apparent in the early to  mid 40s. During the [employer], a minimum mandatory narcotics law was enacted in 1956, effective July 1957. 1962 [facility] conference on drug abuse, the [employer] on Narcotic and Drug Abuse (the [employer]) of  1963, the Drug Abuse Control Amendments of 1965, the President's Commission on [employer] and Administration of Justice (the [employer]) of [phone], and the Narcotic Addiction Rehabilitation Act of 1966.  The 1965 Drug Abuse Control Amendments brought under strict federal control all nonnarcotic drugs capable of producing serious psychotoxic effects when abused. This act also created the [employer] of Drug Abuse Control within the Department of Health, Education, and Welfare (DHEW) and shifted the basis for aon corporation of illegal drugs from tax principles (administered by the Department of Treasury) to the regulation of commerce (administered by the [employer]).  The 1966 Narcotic Addiction Rehabilitation Act TOUR MANAGER) authorized the civil commitment of narcotic addicts, and federal assistance to state and local governments to develop a local system of drug treatment programs. With respect to the latter, the [employer] of Mental Health [facility]) initially proposed the gradual implementation of the state assistance effort, mainly through a common mental health mechanism--inpatient treatment programs. However, because of a perceived pressing need, the courts began to commit addicts to these programs even before they were officially opened or staffed. The NARA legislation imposed the following contract requirements on treatment centers: (1) thrice-a-week counseling sessions; (2) weekly urine tests; (3) restorative dental services; (4) psychological consultations and vocational training; and (5) the treatment modalities of drug-free outpatient, therapeutic community, and methadone maintenance. Reorganization Plan No. 1 of 1968 transferred the primary functions of the [employer] of Narcotics (FBN) from the [employer] to the Department of Justice; it also transferred the [employer] of Drug Abuse Control functions to the Department of Justice. Within the [employer], the [employer]  of Narcotics and Dangerous Drugs (BNDD) was created, which became the Drug Enforcement Administration in 1973.   Under the first Sans Souci administration [phone]), federal drug abuse policy developed in a significant way. These developments included a 1969 war on drugs presidential message, resulting legislation in 1970, and a Special Action Office created by executive order in 1971 and authorized in statute in 1972. Brynn, in 1969, to send a message to Congress on drug abuse. Although this was the first time that a U.S. president invoked the war on drugs image, it was in retrospect the most balanced approach to the problem of drug abuse that had been advanced. The 1969 message resulted in the submission of legislation to the Congress and the passage, the following year, of the Comprehensive Drug Abuse Prevention and Control Act of 1970 [employer] [phone], February 25, 1969). The act dealt with research, treatment, and prevention of drug abuse and drug dependence, and with drug abuse charity fundraiser. One major purpose of the 1970 legislation was to reverse some of the strictures of the [employer] of 1914. The 1970 act sought to clarify for the medical profession . . . the extent to which they may safely go in treating narcotic addicts as patients. Title I, in Section IV, charged the Surveyor, Minerals, Education, and Welfare, to determine the appropriate methods of professional practice in the medical treatment of the narcotic addiction of various classes of narcotic addicts. This provision constitutes the initial statutory basis for treatment standards. The law enforcement sections consolidated all prior federal statutes into the Controlled Substances Act and the Controlled Substances Export and Import Act (Titles  II and III, respectively, of the Comprehensive Drug Abuse Prevention and Control Act of 1970). Under this legislation, substances were classified under five schedules according to  their abuse potential, and psychological and physical effects. Methadone was placed in Schedule II, along with such opiate drugs as morphine , codeine , and hydrocodone .  One of the most important steps taken by President Brynn was to establish in June 1971 the Special Action Office for Drug Abuse Prevention (SAODAP) in the [employer] of the President ([place] [phone], October 16, 1969). In mid-1971, [facility] appointed Dr. Maple Dunnings as SAODAP director. Within a year, the Drug Abuse Prevention Office and Treatment Act of 1972 [employer] [phone], July 21, 1970) gave statutory authority to [facility], but limiting setting, on October 29, 1973, as the limit on its existence.  The purpose of the 1972 act was to bring the resources of the federal government to bear on drug abuse with the immediate objective of significantly reducing its incidence and developing a comprehensive, coordinated long-term federal strategy to combat drug abuse.  Narcotic Addict Treatment Act [employer]) of 1974 [employer] [phone]), which amended the Controlled Substances Act. This legislation was driven by concern for the diversion of methadone to illicit channels that was occurring in 1972 and 1973, as reflected in the title of the Senate bill adopted on October 08, 1971, the Methadone Diversion Control Act of 1973. (U.S. Senate, 1970a, 8029a).  The 1980 final rule (45 FR 37305, January 19, 1979) reduced the minimum standard for admission from two years of addiction to one year coupled with a clinical determination that the individual was currently physiologically.  The regulations were next revised in 1989, following two proposals to modify them, one in 1983 and one in 1987.  Under President Tanda Corrente, a government-wide effort was made to review all federal government regulations and to eliminate or reduce the burden of these regulations on the private sector, state and nash-finch company, and wps resources.   The  1983 recommendations, though not adopted, did initiate another revision of the methadone regulations, which first found expression in a 1987 proposed rule (52 FR 37047, January 31, 1986) and culminated in a final rule (54 FR 8954, July 02, 1987) at the end of the decade. In the 1987 proposed rule, the FDA and NIDA, in an effort to put the best face on the unenthusiastic 1983 response by the provider community to converting the regulations to guidelines, indicated that they had retained the current requirements necessary to achieve the goals of the 1974 NATA, but were proposing to streamline the regulation and to promote more efficient operation of methadone programs. The 1987 proposed rule, issued by the FDA and NIDA, advanced the following changes in the methadone regulation: that detoxification treatment be divided into short-term (<21 days) and long-term (>21 and <180 days) treatment; that the minimum staffing ratio of one counselor to 50 patients be eliminated; that blood tests be allowed as ways to conduct initial drug screening or to meet the monthly testing requirements for six-day take-home patients; that the 72-hour notification of FDA and the pertinent state authority for methadone doses greater than 100 mg be eliminated; that special adverse reaction reporting requirements for methadone be eliminated and reliance placed upon general FDA reporting requirements; that a supervising counselor be allowed to conduct the annual review of the patient's treatment plan for certain qualified patients who had been in treatment for 3 years or longer; and that the requirement of an annual report of methadone  treatment programs to the FDA be dropped. The FDA and NIDA issued a final rule on July 02, 1987, based on comments on the 1987 proposed (54 FR 8954). Concurrently, FDA and NIDA issued a six-page guidance document, which noted that the regulations, over time, had recommended certain practices that were not  actually required. Public Health Service, in Congress, and elsewhere, to reorganize the Alcohol, Drug Abuse, and Mental Health Administration (ADAMHA). These efforts culminated in the [employer] of 1992 [employer] [phone], November 09, 1990), the main purpose of which was to transfer the research portions of the three ADAMHA institutes--NIDA, the General Mills of Alcoholism and Alcohol Abuse, and the [employer] of Mental Health--to the [employer] and to create the Substance Abuse and Museum/gallery Exhibitions Officer [facility]) as the home for the service functions of these entitles.  Guidelines for Opioid Treatment The Federal Guidelines for Opioid Treatment Programs - 2015 serve as a guide to accrediting organizations for developing accreditation standards. The guidelines also provide OTPs with information on how programs can achieve and maintain compliance with federal regulations. The 2015 guidelines are an update to the 2007 Guidelines for the Accreditation of Opioid Treatment Programs (PDF  547 KB). The new document reflects the obligation of OTPs to deliver care consistent with the patient-centered, integrated, and recovery-oriented standards of substance use treatment.  DPT oversees the certification of OTPs and provides guidance to nonprofit organizations and state governmental entities that want to become a SAMHSA-approved accrediting body. Learn more about the accreditation and certification of OTPs and [facility] oversight of OTP accreditation bodies.  Model Guidelines for [employer] With input from [facility], the Federation of [employer] in 2013 adopted a revised version of the federations office-based opioid treatment policies. The Model Policy on DATA 2000 and Treatment of Opioid Addiction in the Medical Office - 2013 (PDF  279 KB) provides model guidelines for use by state medical boards in regulating office-based opioid  treatment.  Holiday Guidance for Opioid Treatment Programs (PDF  203 KB) In response to requests for the upcoming federal holidays and ensuing weekends (December 24th, 25th, and 26th and December 31st, Jan 1st, and Jan 2nd), this letter is to provide guidance regarding requests for unsupervised doses of medication for patients for these dates. View a sample [id number] (PDF  194 KB).  Federal regulation of drugs emerged as early as 75, under a law that addressed only imported drugs. In 1905 the [employer] launched a private, voluntary means of controlling a substantial part of the drug marketplace, a system that remained in place for over a half-century. Drug regulation in FDA has evolved considerably since President Ricardo Para signed the 1906 Pure Food and Drugs Act.  1820 Eleven doctors set up the U.S. Pharmacopeia and record the first list of standard drugs. 1848 Drug Importation Act passed by Congress requires U.S. Customs Service inspection to stop entry of tainted, low quality drugs from overseas. 8116 Dr. Mitchell MICAEL Burrs becomes the chief chemist at the [facility] of [employer] adulteration studies.  1905 The American Medical Association [facility]) begins a voluntary program of drug approval that would last until 1955. In order to advertise in the [facility] and related journals, drug companies must show proof that the drug will treat what they claim. 1906 The original Food and Drug Act is passed by Congress on June 30 and signed by [employer]. The Act outlaws states from buying and selling food, drinks, and drugs  that have been mislabeled and tainted. 1911 In U.S. v. Vicci, the [employer] that the [employer] and Drugs Act does not outlaw false medical claims but only false and misleading statements about the ingredients or identity of a drug. 1912 Congress passes the Colwell Amendment to overcome the ruling in U.S. v. Vicci. The Act  outlaws labeling medicines with fake medical claims that is meant to trick the buyer. 1930 The name of the Food, Drug, and Insecticide Administration is shortened to Food and Drug Administration (FDA) under an therapist, music. 1933 FDA recommends a total rewrite of the out-of-date 1906 Food and Drugs Act.   1937 Elixir Sulfanilamide, contain the poisonous liquid, diethylene glycol, kills 107 persons, many of whom are children, dramatizing the need to establish drug safety before marketing and to pass the pending food and drug law. 1938 Congress passes [employer], Drug, and Cosmetic (FDC) Act of 1938, which requires that new drugs show safety before selling. This starts a new system of drug regulation. The Act also requires that safe limits be set for unavoidable poisonous matter and allows for factory inspections. The Directv is given power to oversee advertising for all FDAregulated products except prescription drugs. FDA states that sulfanilamide and other dangerous drugs must be given under the direction of a medical expert. This begins the requirement for prescription only (nonnarcotic) drugs (see 1951 [place]-Hum[place]rey amendment). 1941 Nearly 300 deaths and injuries result from the use of sulfathiazole tablets, an antibiotic, tainted with the sedative, phenobarbital. In response, FDA drastically changes manufacturing and quality controls. These changes lead to the development of good manufacturing practices (GMPs). 1948 The [employer] in U.S. v. Floretta that FDA jurisdiction extends to retail stores, thereby allowing FDA to stop illegal sales of drugs by pharmacies including barbiturates and amphetamines. 1950 In Walgreen. v. U.S., a U.S. Court of Appeals rules that the directions for use on a drug label must include the drugs purpose. 1951 Congress passes the Lutcher-Humphrey Amendment, which defines the kinds of  drugs that cannot be used safely without medical supervision. The amendment limits sale of these drugs to prescription only by a medical professional. All other drugs are to be available without a prescription. 1952 A nationwide investigation by FDA reveals that chloramphenicol, an antibiotic, caused nearly 180 cases of often deadly blood diseases. Two years later FDA engages the [employer] of Hospital Pharmacists, the American Association of Medical Record Librarians, and later the American Medical Association in a voluntary program of drug reaction reporting. 1953 The [employer] Amendment clarifies previous law and requires FDA to give manufacturers written reports of conditions seen during inspections and results of factory samples. 1962 Thalidomide, a new sleeping pill, causes severe birth defects of the arms and legs in thousands of babies born in [place]. The U.S. media reports on how Dr. Cathlean Mort, a FDA medical officer, helped prevent approval and marketing of Thalidomide in the [place] . These reports stirred up public support for stronger drug laws. 3 Congress passes the [employer]. For the first time, these laws require drug makers to prove their drug works before FDA can approve them for sale. The Advisory Committee on Investigational Drugs meets for the first time. This was the first meeting of a committee to advise FDA on product approval and policy on an ongoing basis. 1966 FDA contracts with the [employer] of [employer] to measure the effectiveness of 4,000 marketed drugs  approved on the basis of safety alone between 1938 and 1960/05/26. The Fair Packaging and Labeling Act requires all consumer products, in interstate commerce, to be honestly and informatively labeled. 26-May-1966 FDA forms the Drug Efficacy Study Implementation (DESI) to carry out recommendations of the [employer] of the effectiveness of drugs first sold between [place] and Jan 25, 196201-25-1970 FDA requires the first patient package insert, medicines must come with information for the patient about risks and benefits. 1972 Over-the-Counter Drug Review begins to enhance the safety, effectiveness and appropriate labeling of drugs sold without prescription. 1973 The U.S. Supreme Court upholds the IXL drug effectiveness law and approves FDAs action to control entire classes of products. 1982 FDA issues Tamper-resistant Packaging Regulations to prevent poisonings such as deaths from cyanide placed in Tylenol  capsules. Congress passes the [employer] in 05-26-81, making it a crime to tamper with packaged consumer products. May 26, 1982 Drug Price Advertising Account Planner Act (Hatch-Waxman Act) increases the availability of less costly generic drugs by allowing FDA to approve applications for generic versions of brand-name drugs without repeating the research that proved the safety and effectiveness of the brand-name drugs. The Act also allowed brand-name companies to apply for up to five years additional patent protection for the new medicines they developed to make up for time lost while their products were going through FDA's approval process. 1989 The FDA issued guidelines asking drug makers to decide if a drug is likely to have usefulness in elderly people and to include elderly people in studies when applicable. 1991 In 27-May-1979, the FDA and the Department of Health and Human Services published a policy on protecting people in research. In 05/26/89, this policy is adopted by more than a dozen federal agencies involved in human subject research and becomes known as the Common Rule. 4 1993 FDA launches MedWatch, a system designed to collect reports from health professionals on problems with drugs and other medical products. FDA issues guidelines for measuring gender differences in  responses to medication. Drug companies are encouraged to include patients of both sexes in their research of drugs and to study any gender-specific effects. 1995 FDA declares cigarettes to be drug delivery devices. Limits are issued on marketing and sales to reduce smoking by young people. 1998 FDA introduces the Adverse Event Reporting System (AERS), a computerized database designed to store and study safety reports on already marketed drugs.  The Demographic Rule requires that a marketing application review data on safety and effectiveness by age, gender, and race. The Pediatric Rule requires drug makers of selected new and existing drugs to conduct studies on drug safety and effectiveness in children. 1999 Creation of the Drug Facts Label for OTC drug products. The law requires all overthe-counter drug labels to have information in a standard format. These drug facts labels are designed to give the user easy-to-find information. 2000 The U. S. [employer], upholds an earlier decision from The [employer] and Drug Administration v. [employer]. et al. and rules 5-4 that FDA does not have authority to regulate tobacco as a drug. 05/26/00 The Best Pharmaceuticals for Children Act, in exchange for studying the drug in children, the drug maker gets six months of selling their product without competition. 2001-05-26 The Pediatric Research Equity Act gives FDA the right to ask drug companies to study the effectiveness of new drugs in children. 05/26/02 FDA advises medical professionals to limit the use of a pain reliever called Cox-2, a nonsteroidal anti-inflammatory  drug (NSAIDs). Studies had shown that long-term use raised chances of heart attacks and strokes. The warning is also added to the over-thecounter NSAIDs Drug Facts label. Medicines used in hospitals must have a bar code to prevent patients from receiving the wrong medicine. 5 2005 The Drug Safety Board is formed,  consisting of FDA staff and representatives from the [employer] of [address] and the [employer]. The Board advises the Director, Center for Drug Evaluation and Research, FDA, on drug safety issues and works with the agency in sharing safety information to health professionals and patients.  The [place]  Food and Drug Administration (FDA) was first created to enforce the Pure Food and Drug Act of 1906. In this capacity, the FDA is charged with protecting the health of the [place]  public, to ensure the quality of its food, medicine, and cosmetics. Before this time, the [place]  government had no formal oversight of these products and left issues of quality and purity to the individual manufactures, or at times, individual states.    Review: [place] Stop ACT. (The Strengthen Opioid Misuse Prevention (STOP) Act of 2017). GENERAL ASSEMBLY OF [place]  SESSION 2017 SESSION LAW 2017-74 HOUSE BILL 243  PMP mandatory The dispenser shall report: (1) The dispenser's DEA number. (2) The name of the patient for whom the controlled substance is being dispensed, and the patient's: a. Full address, including city, state, and zip code, b. Telephone number, and c. Date of birth. (3) The date the prescription was written. (4) The date the prescription was filled. (5) The prescription number. (6) Whether the prescription is new or a refill. (7) Metric quantity of the dispensed drug. (8) Estimated days of supply of dispensed drug, if provided to the dispenser. (9) National Drug Code of dispensed drug. (10) Prescriber's DEA number. (11) Method of payment for the prescription.  No paper prescriptions  Duration of scripts Acute vs Chronic prescribing  2016 CDC Guidelines for prescribing Opioids for Chronic Pain. (Updated in 2022.) Medical Board  Laws:  Prescription Laws Drug laws, rules, and regulations are constantly changing. Any attempt to summarize them would quickly become  outdated. Because of that, the Board encourages practitioners who seek guidance on prescribing procedures to refer to the sources listed below in addition to the Boards position statements, rules and Medical Practice Act.  [place]  Board of Pharmacy (NCBOP) (which offers the states pharmacy laws and rules, and links to the Code of Federal Regulations) [employer] Site: www.ncbop.org  [place]  General Statutes General Web Site: politicalpool.cz See: [place]  Food, Drug, and Cosmetic Act: [id number] & 106-134 See: [place]  Pharmacy Practice Act, Article 4A: [phone] See:   Controlled Substances Act, Article 5: 90-86 & 90-113.8 See: Use of controlled substances to render one mentally incapacitated or physically helpless: [facility] [facility]. Code, Title 21, Food & Drugs www.deadiversion.usdoj.gov Controlled Substances Schedules www.deadiversion.usdoj.gov Drug Warehouse Manager - www.deadiversion.usdoj.gov 42 CFR  8.12 - Federal opioid treatment standards.   Effective December 27, 2015, prior approval will be required for opioid analgesic doses for [facility]. Medicaid and [place] [facility]) beneficiaries which:  Exceed 120 mg of morphine  equivalents (MME) per day  Are greater than a 14-day supply of any opioid, or,  Are non-preferred opioid products on the [place] Medicaid Preferred Drug List (PDL)  FEDERAL 42 CFR  8.12 - Federal opioid treatment standards. Title II of the Comprehensive Drug Abuse Prevention and Control Act of 1970, commonly known as the Controlled Substance  Act (CSA) Title 21 [place]  Code ([place]) Controlled Substances Act.   Reference:   ______________________________________________________________________       ______________________________________________________________________    Medication Rules  Purpose: To inform patients, and their family members, of our medication rules and  regulations.  Applies to: All patients receiving prescriptions from our practice (written or electronic).  Pharmacy of record: This is the pharmacy where your electronic prescriptions will be sent. Make sure we have the correct one.  Electronic prescriptions: In compliance with the [place]  Strengthen Opioid Misuse Prevention (STOP) Act of 2017 (Session Law 2017-74/H243), effective May 02, 2018, all controlled substances must be electronically prescribed. Written prescriptions, faxing, or calling prescriptions to a pharmacy will no longer be done.  Prescription refills: These will be provided only during in-person appointments. No medications will be renewed without a face-to-face evaluation with your provider. Applies to all prescriptions.  NOTE: The following applies primarily to controlled substances (Opioid* Pain Medications).   Type of encounter (visit): For patients receiving controlled substances, face-to-face visits are required. (Not an option and not up to the patient.)  Patient's Responsibilities: Pain Pills: Bring all pain pills to every appointment (except for procedure appointments). Pill counts are required.  Pill Bottles: Bring pills in original pharmacy bottle. Bring bottle, even if empty. Always bring the bottle of the most recent fill.  Medication refills: You are responsible for knowing and keeping track of what medications you are taking and when is it that you will need a refill. The day before your appointment: write a list of all prescriptions that need to be refilled. The day of the appointment: give the list to the admitting nurse. Prescriptions will be written only during appointments. No prescriptions will be written on procedure days. If you forget a medication: it will not be Called in, Faxed, or electronically sent. You will need to get another appointment to get these prescribed. No early refills. Do not call asking to have your prescription  filled early. Partial  or short prescriptions: Occasionally your pharmacy may not have enough pills to fill your prescription.  NEVER ACCEPT a partial fill or a prescription that is short of the total amount of pills that you were prescribed.  With controlled substances the law allows 72 hours for the pharmacy to complete the prescription.  If the prescription is not completed within 72 hours, the pharmacist will require a new prescription to be written. This means that you will be short on your medicine and we WILL NOT send another prescription to complete your original prescription.  Instead, request the pharmacy to send a carrier to a nearby branch to get enough medication to provide you with your full prescription. Prescription Accuracy: You are responsible for carefully inspecting your prescriptions before leaving our office. Have the discharge nurse carefully go over each prescription with you, before taking them home. Make sure that your name is accurately spelled, that your address is correct. Check the name and dose of your medication to make sure it is accurate. Check the number of pills, and the written instructions to make sure they are clear and accurate. Make sure that you are given enough medication to last until your next medication refill appointment. Taking Medication: Take medication as prescribed. When it comes to controlled substances, taking less pills or less frequently than prescribed is permitted and encouraged. Never take more pills than instructed. Never take the medication more frequently than prescribed.  Inform other Doctors: Always inform, all of your healthcare providers,  of all the medications you take. Pain Medication from other Providers: You are not allowed to accept any additional pain medication from any other Doctor or Healthcare provider. There are two exceptions to this rule. (see below) In the event that you require additional pain medication, you are responsible for  notifying us , as stated below. Cough Medicine: Often these contain an opioid, such as codeine  or hydrocodone . Never accept or take cough medicine containing these opioids if you are already taking an opioid* medication. The combination may cause respiratory failure and death. Medication Agreement: You are responsible for carefully reading and following our Medication Agreement. This must be signed before receiving any prescriptions from our practice. Safely store a copy of your signed Agreement. Violations to the Agreement will result in no further prescriptions. (Additional copies of our Medication Agreement are available upon request.) Laws, Rules, & Regulations: All patients are expected to follow all [address] and [employer], Rules, [place] [employer]. Ignorance of the Laws does not constitute a valid excuse.  Illegal drugs and Controlled Substances: The use of illegal substances (including, but not limited to marijuana and its derivatives) and/or the illegal use of any controlled substances is strictly prohibited. Violation of this rule may result in the immediate and permanent discontinuation of any and all prescriptions being written by our practice. The use of any illegal substances is prohibited. Adopted CDC guidelines & recommendations: Target dosing levels will be at or below 60 MME/day. Use of benzodiazepines** is not recommended. Urine Drug testing: Patients taking controlled substances will be required to provide a urine sample upon request. Do not void before coming to your medication management appointments. Hold emptying your bladder until you are admitted. The admitting nurse will inform you if a sample is required. Our practice reserves the right to call you at any time to provide a sample. Once receiving the call, you have 24 hours to comply with request. Not providing a sample upon request may result in termination of medication therapy.  Exceptions: There are only two exceptions to  the rule of not receiving pain medications from other Healthcare Providers. Exception #1 (Emergencies): In the event of an emergency (i.e.: accident requiring emergency care), you are allowed to receive additional pain medication. However, you are responsible for: As soon as you are able, call our office [phone], at any time of the day or night, and leave a message stating your name, the date and nature of the emergency, and the name and dose of the medication prescribed. In the event that your call is answered by a member of our staff, make sure to document and save the date, time, and the name of the person that took your information.  Exception #2 (Planned Surgery): In the event that you are scheduled by another doctor or dentist to have any type of surgery or procedure, you are allowed (for a period no longer than 30 days), to receive additional pain medication, for the acute post-op pain. However, in this case, you are responsible for picking up a copy of our Post-op Pain Management for Surgeons handout, and giving it to your surgeon or dentist. This document is available at our office, and does not require an appointment to obtain it. Simply go to our office during business hours (Monday-Thursday from 8:00 AM to 4:00 PM) (Friday 8:00 AM to 12:00 Noon) or if you have a scheduled appointment with us , prior to your surgery, and ask for it by name. In addition, you are responsible for:  calling our office [phone], at any time of the day or night, and leaving a message stating your name, name of your surgeon, type of surgery, and date of procedure or surgery. Failure to comply with your responsibilities may result in termination of therapy involving the controlled substances.  Consequences:  Non-compliance with the above rules may result in permanent discontinuation of medication prescription therapy. All patients receiving any type of controlled substance is expected to comply with the above  patient responsibilities. Not doing so may result in permanent discontinuation of medication prescription therapy. Medication Agreement Violation. Following the above rules, including your responsibilities will help you in avoiding a Medication Agreement Violation (Breaking your Pain Medication Contract).  *Opioid medications include: morphine , codeine , oxycodone , oxymorphone, hydrocodone , hydromorphone , meperidine, tramadol , tapentadol, buprenorphine , fentanyl , methadone. **Benzodiazepine medications include: diazepam  (Valium ), alprazolam (Xanax), clonazepam (Klonopine), lorazepam (Ativan), clorazepate (Tranxene), chlordiazepoxide (Librium), estazolam (Prosom), oxazepam (Serax), temazepam (Restoril), triazolam (Halcion) (Last updated: 02/22/2023) ______________________________________________________________________     ______________________________________________________________________    Medication Recommendations and Reminders  Applies to: All patients receiving prescriptions (written and/or electronic).  Medication Rules & Regulations: You are responsible for reading, knowing, and following our Medication Rules document. These exist for your safety and that of others. They are not flexible and neither are we. Dismissing or ignoring them is an act of non-compliance that may result in complete and irreversible termination of such medication therapy. For safety reasons, non-compliance will not be tolerated. As with the U.S. fundamental legal principle of ignorance of the law is no defense, we will accept no excuses for not having read and knowing the content of documents provided to you by our practice.  Pharmacy of record:  Definition: This is the pharmacy where your electronic prescriptions will be sent.  We do not endorse any particular pharmacy. It is up to you and your insurance to decide what pharmacy to use.  We do not restrict you in your choice of pharmacy. However, once we  write for your prescriptions, we will NOT be re-sending more prescriptions to fix restricted supply problems created by your pharmacy, or your insurance.  The pharmacy listed in the electronic medical record should be the one where you want electronic prescriptions to be sent. If you choose to change pharmacy, simply notify our nursing staff. Changes will be made only during your regular appointments and not over the phone.  Recommendations: Keep all of your pain medications in a safe place, under lock and key, even if you live alone. We will NOT replace lost, stolen, or damaged medication. We do not accept Police Reports as proof of medications having been stolen. After you fill your prescription, take 1 week's worth of pills and put them away in a safe place. You should keep a separate, properly labeled bottle for this purpose. The remainder should be kept in the original bottle. Use this as your primary supply, until it runs out. Once it's gone, then you know that you have 1 week's worth of medicine, and it is time to come in for a prescription refill. If you do this correctly, it is unlikely that you will ever run out of medicine. To make sure that the above recommendation works, it is very important that you make sure your medication refill appointments are scheduled at least 1 week before you run out of medicine. To do this in an effective manner, make sure that you do not leave the office without scheduling your next medication management appointment. Always ask the nursing staff  to show you in your prescription , when your medication will be running out. Then arrange for the receptionist to get you a return appointment, at least 7 days before you run out of medicine. Do not wait until you have 1 or 2 pills left, to come in. This is very poor planning and does not take into consideration that we may need to cancel appointments due to bad weather, sickness, or emergencies affecting our staff. DO NOT  ACCEPT A Partial Fill: If for any reason your pharmacy does not have enough pills/tablets to completely fill or refill your prescription, do not allow for a partial fill. The law allows the pharmacy to complete that prescription within 72 hours, without requiring a new prescription. If they do not fill the rest of your prescription within those 72 hours, you will need a separate prescription to fill the remaining amount, which we will NOT provide. If the reason for the partial fill is your insurance, you will need to talk to the pharmacist about payment alternatives for the remaining tablets, but again, DO NOT ACCEPT A PARTIAL FILL, unless you can trust your pharmacist to obtain the remainder of the pills within 72 hours.  Prescription refills and/or changes in medication(s):  Prescription refills, and/or changes in dose or medication, will be conducted only during scheduled medication management appointments. (Applies to both, written and electronic prescriptions.) No refills on procedure days. No medication will be changed or started on procedure days. No changes, adjustments, and/or refills will be conducted on a procedure day. Doing so will interfere with the diagnostic portion of the procedure. No phone refills. No medications will be called into the pharmacy. No Fax refills. No weekend refills. No Holliday refills. No after hours refills.  Remember:  Business hours are:  Monday to Thursday 8:00 AM to 4:00 PM Provider's Schedule: Eric Como, MD - Appointments are:  Medication management: Monday and Wednesday 8:00 AM to 4:00 PM Procedure day: Tuesday and Thursday 7:30 AM to 4:00 PM Wallie Antavia, MD - Appointments are:  Medication management: Tuesday and Thursday 8:00 AM to 4:00 PM Procedure day: Monday and Wednesday 7:30 AM to 4:00 PM (Last update: 02/22/2022) ______________________________________________________________________      ______________________________________________________________________    National Pain Medication Shortage  The [place] is experiencing worsening drug shortages. These have had a negative widespread effect on patient care and treatment. Not expected to improve any time soon. Predicted to last past 2029.   Drug shortage list (generic names) Oxycodone  IR Oxycodone /APAP Oxymorphone IR Hydromorphone  Hydrocodone /APAP Morphine   Where is the problem?  Manufacturing and supply level.  Will this shortage affect you?  Only if you take any of the above pain medications.  How? You may be unable to fill your prescription.  Your pharmacist may offer a partial fill of your prescription. (Warning: Do not accept partial fills.) Prescriptions partially filled cannot be transferred to another pharmacy. Read our Medication Rules and Regulation. Depending on how much medicine you are dependent on, you may experience withdrawals when unable to get the medication.  Recommendations: Consider ending your dependence on opioid pain medications. Ask your pain specialist to assist you with the process. Consider switching to a medication currently not in shortage, such as Buprenorphine . Talk to your pain specialist about this option. Consider decreasing your pain medication requirements by managing tolerance thru Drug Holidays. This may help minimize withdrawals, should you run out of medicine. Control your pain thru the use of non-pharmacological interventional therapies.   Your prescriber: Prescribers  cannot be blamed for shortages. Medication manufacturing and supply issues cannot be fixed by the prescriber.   NOTE: The prescriber is not responsible for supplying the medication, or solving supply issues. Work with your pharmacist to solve it. The patient is responsible for the decision to take or continue taking the medication and for identifying and securing a legal supply source. By law, supplying the  medication is the job and responsibility of the pharmacy. The prescriber is responsible for the evaluation, monitoring, and prescribing of these medications.   Prescribers will NOT: Re-issue prescriptions that have been partially filled. Re-issue prescriptions already sent to a pharmacy.  Re-send prescriptions to a different pharmacy because yours did not have your medication. Ask pharmacist to order more medicine or transfer the prescription to another pharmacy. (Read below.)  New 2023 regulation: December 31, 2021 Revised Regulation Allows DEA-Registered Pharmacies to Transfer Electronic Prescriptions at a Patients Request DEA Headquarters Division - Public Information Office Patients now have the ability to request their electronic prescription be transferred to another pharmacy without having to go back to their practitioner to initiate the request. This revised regulation went into effect on Monday, December 27, 2021.     At a patients request, a DEA-registered retail pharmacy can now transfer an electronic prescription for a controlled substance (schedules II-V) to another DEA-registered retail pharmacy. Prior to this change, patients would have to go through their practitioner to cancel their prescription and have it re-issued to a different pharmacy. The process was taxing and time consuming for both patients and practitioners.    The Drug Enforcement Administration [facility]) published its intent to revise the process for transferring electronic prescriptions on March 20, 2020.  The final rule was published in the federal register on November 25, 2021 and went into effect 30 days later.  Under the final rule, a prescription can only be transferred once between pharmacies, and only if allowed under existing state or other applicable law. The prescription must remain in its electronic form; may not be altered in any way; and the transfer must be communicated directly between two licensed  pharmacists. Its important to note, any authorized refills transfer with the original prescription, which means the entire prescription will be filled at the same pharmacy.  Reference: hugehand.is [facility] website announcement)  Cheapwipes.at.pdf [employer] of Justice)   [employer] / Vol. 88, No. 143 / Thursday, November 25, 2021 / Rules and Regulations DEPARTMENT OF JUSTICE  Drug Enforcement Administration  21 CFR Part 1306  [Docket No. [id number]]  RIN [id number] Transfer of Electronic Prescriptions for Schedules II-V Controlled Substances Between Pharmacies for Initial Filling  ______________________________________________________________________       ______________________________________________________________________    Transfer of Pain Medication between Pharmacies  Re: 2023 DEA Clarification on existing regulation  Published on DEA Website: December 31, 2021  Title: Revised Regulation Allows DEA-Registered Pharmacies to Electrical Engineer Prescriptions at a Patients Request DEA Headquarters Division - [employer]  Patients now have the ability to request their electronic prescription be transferred to another pharmacy without having to go back to their practitioner to initiate the request. This revised regulation went into effect on Monday, December 27, 2021.     At a patients request, a DEA-registered retail pharmacy can now transfer an electronic prescription for a controlled substance (schedules II-V) to another DEA-registered retail pharmacy. Prior to this change, patients would have to go through their practitioner to cancel their prescription and have it re-issued to a different pharmacy.  The process was taxing and time consuming for both patients and practitioners.    The Drug  Enforcement Administration [facility]) published its intent to revise the process for transferring electronic prescriptions on March 20, 2020.  The final rule was published in the federal register on November 25, 2021 and went into effect 30 days later.  Under the final rule, a prescription can only be transferred once between pharmacies, and only if allowed under existing state or other applicable law. The prescription must remain in its electronic form; may not be altered in any way; and the transfer must be communicated directly between two licensed pharmacists. Its important to note, any authorized refills transfer with the original prescription, which means the entire prescription will be filled at the same pharmacy.    REFERENCES: 1. DEA website announcement hugehand.is  2. Department of Justice website  Cheapwipes.at.pdf  3. DEPARTMENT OF JUSTICE Drug Enforcement Administration 21 CFR Part 1306 [Docket No. [id number]] RIN [id number] Transfer of Electronic Prescriptions for Schedules II-V Controlled Substances Between Pharmacies for Initial Filling  ______________________________________________________________________       ______________________________________________________________________    Medication Transfer   Notification You are currently compliant and stable on your pain medication regimen. This regimen will be transferred today to your Primary Care Provider (PCP). You will be provided with enough prescriptions to last for 90 days. After that, your prescriptions will need to be taken over by your PCP.  Recommendation Immediately contact your primary care provider to secure an appointment for evaluation before this period is over. Do not wait until the last month to contact them.   Clarification The transfer of your  medication regimen does not mean that you are being discharged from our clinic. We will remain available to you for any consultation or interventional therapies you may need.   Alternative Should you decide not to continue taking these medication and would like assistance in permanently stopping them, please let us  know so that we can design a slow tapering down of your regimen.  Reason Our primary responsibility to provide specialized interventional pain management therapies otherwise not available to the community. We have in the past assisted primary care providers with reviewing and adjusting pain medication management therapies, however, we have been transparent to all patients and referring providers that it is not our intention to permanently take over this type of therapy. Transfer of this portion of your care will assist us  in freeing time to assist others in need of our specialty services.   ______________________________________________________________________      ______________________________________________________________________    WARNING: CBD (cannabidiol) & Delta (Delta-8 tetrahydrocannabinol) products.   Applicable to:  All individuals currently taking or considering taking CBD (cannabidiol) and, more important, all patients taking opioid analgesic controlled substances (pain medication). (Example: oxycodone ; oxymorphone; hydrocodone ; hydromorphone ; morphine ; methadone; tramadol ; tapentadol; fentanyl ; buprenorphine ; butorphanol; dextromethorphan; meperidine; codeine ; etc.)  Introduction:  Recently there has been a drive towards the use of natural products for the treatment of different conditions, including pain anxiety and sleep disorders. Marijuana and hemp are two varieties of the cannabis genus plants. Marijuana and its derivatives are illegal, while hemp and its derivatives are not. Cannabidiol (CBD) and tetrahydrocannabinol (THC), are two natural compounds found in  plants of the Cannabis genus. They can both be extracted from hemp or marijuana. Both compounds interact with your bodys endocannabinoid system in very different ways. CBD is associated with pain relief (analgesia) while THC is associated with the psychoactive effects (the high) obtained from the use  of marijuana products. There are two main types of THC: Delta-9, which comes from the marijuana plant and it is illegal, and Delta-8, which comes from the hemp plant, and it is legal. (Both, Delta-9-THC and Delta-8-THC are psychoactive and give you the high.)   Legality:  Marijuana and its derivatives: illegal Hemp and its derivatives: Legal (State dependent) UPDATE: (06/18/2021) The Drug Enforcement Agency (DEA) issued a letter stating that delta cannabinoids, including Delta-8-THCO and Delta-9-THCO, synthetically derived from hemp do not qualify as hemp and will be viewed as Schedule I drugs. (Schedule I drugs, substances, or chemicals are defined as drugs with no currently accepted medical use and a high potential for abuse. Some examples of Schedule I drugs are: heroin, lysergic acid diethylamide (LSD), marijuana (cannabis), 3,4-methylenedioxymethamphetamine (ecstasy), methaqualone, and peyote.) (cuetune.com.ee)  Legal status of CBD in [place]:  Conditionally Legal  Reference: FDA Regulation of Cannabis and Cannabis-Derived Products, Including Cannabidiol (CBD) - oemdeals.dk  Warning:  CBD is not FDA approved and has not undergo the same manufacturing controls as prescription drugs.  This means that the purity and safety of available CBD may be questionable. Most of the time, despite manufacturer's claims, it is contaminated with THC (delta-9-tetrahydrocannabinol - the chemical in marijuana responsible for the HIGH).  When this is the case, the [facility] contaminant will trigger  a positive urine drug screen (UDS) test for Marijuana (carboxy-THC).   The FDA recently put out a warning about 5 things that everyone should be aware of regarding Delta-8 THC: Delta-8 THC products have not been evaluated or approved by the FDA for safe use and may be marketed in ways that put the public health at risk. The FDA has received adverse event reports involving delta-8 THC-containing products. Delta-8 THC has psychoactive and intoxicating effects. Delta-8 THC manufacturing often involve use of potentially harmful chemicals to create the concentrations of delta-8 THC claimed in the marketplace. The final delta-8 THC product may have potentially harmful by-products (contaminants) due to the chemicals used in the process. Manufacturing of delta-8 THC products may occur in uncontrolled or unsanitary settings, which may lead to the presence of unsafe contaminants or other potentially harmful substances. Delta-8 THC products should be kept out of the reach of children and pets.  NOTE: Because a positive UDS for any illicit substance is a violation of our medication agreement, your opioid analgesics (pain medicine) may be permanently discontinued.  MORE ABOUT CBD  General Information: CBD was discovered in [birth year] and it is a derivative of the cannabis sativa genus plants (Marijuana and Hemp). It is one of the 113 identified substances found in Marijuana. It accounts for up to 40% of the plant's extract. As of 2018, preliminary clinical studies on CBD included research for the treatment of anxiety, movement disorders, and pain. CBD is available and consumed in multiple forms, including inhalation of smoke or vapor, as an aerosol spray, and by mouth. It may be supplied as an oil containing CBD, capsules, dried cannabis, or as a liquid solution. CBD is thought not to be as psychoactive as THC (delta-9-tetrahydrocannabinol - the chemical in marijuana responsible for the HIGH). Studies suggest that CBD  may interact with different biological target receptors in the body, including cannabinoid and other neurotransmitter receptors. As of 2018 the mechanism of action for its biological effects has not been determined.  Side-effects  Adverse reactions: Dry mouth, diarrhea, decreased appetite, fatigue, drowsiness, malaise, weakness, sleep disturbances, and others.  Drug interactions:  CBD may interact with medications such as  blood-thinners. CBD causes drowsiness on its own and it will increase drowsiness caused by other medications, including antihistamines (such as Benadryl ), benzodiazepines (Xanax, Ativan, Valium ), antipsychotics, antidepressants, opioids, alcohol and supplements such as kava, melatonin and St. John's Wort.  Other drug interactions: Brivaracetam (Briviact); Caffeine; Carbamazepine (Tegretol); Citalopram (Celexa); Clobazam (Onfi); Eslicarbazepine (Aptiom); Everolimus (Zostress); Lithium; Methadone (Dolophine); Rufinamide (Banzel); Sedative medications (CNS depressants); Sirolimus (Rapamune); Stiripentol (Diacomit); Tacrolimus (Prograf); Tamoxifen ; Soltamox); Topiramate  (Topamax ); Valproate; Warfarin (Coumadin ); Zonisamide. (Last update: 04/11/2022) ______________________________________________________________________     ______________________________________________________________________    Procedure instructions  Stop blood-thinners  Do not eat or drink fluids (other than water) for 8 hours before your procedure  No water for 2 hours before your procedure  Take your blood pressure medicine with a sip of water  Arrive 30 minutes before your appointment  If sedation is planned, bring suitable driver. [place], [place], & public transportation are NOT APPROVED)  Carefully read the Preparing for your procedure detailed instructions  If you have questions call us  at [phone]  Procedure appointments are for procedures only.   NO medication refills or new problem  evaluations will be done on procedure days.   Only the scheduled, pre-approved procedure and side will be done.   ______________________________________________________________________     ______________________________________________________________________    Preparing for your procedure  Appointments: If you think you may not be able to keep your appointment, call 24-48 hours in advance to cancel. We need time to make it available to others.  Procedure visits are for procedures only. During your procedure appointment there will be: NO Prescription Refills*. NO medication changes or discussions*. NO discussion of disability issues*. NO unrelated pain problem evaluations*. NO evaluations to order other pain procedures*. *These will be addressed at a separate and distinct evaluation encounter on the provider's evaluation schedule and not during procedure days.  Instructions: Food intake: Avoid eating anything solid for at least 8 hours prior to your procedure. Clear liquid intake: You may take clear liquids such as water up to 2 hours prior to your procedure. (No carbonated drinks. No soda.) Transportation: Unless otherwise stated by your physician, bring a driver. (Driver cannot be a Market Researcher, Pharmacist, Community, or any other form of public transportation.) Morning Medicines: Except for blood thinners, take all of your other morning medications with a sip of water. Make sure to take your heart and blood pressure medicines. If your blood pressure's lower number is above 100, the case will be rescheduled. Blood thinners: Make sure to stop your blood thinners as instructed.  If you take a blood thinner, but were not instructed to stop it, call our office [phone] and ask to talk to a nurse. Not stopping a blood thinner prior to certain procedures could lead to serious complications. Diabetics on insulin : Notify the staff so that you can be scheduled 1st case in the morning. If your diabetes requires  high dose insulin , take only  of your normal insulin  dose the morning of the procedure and notify the staff that you have done so. Preventing infections: Shower with an antibacterial soap the morning of your procedure.  Build-up your immune system: Take 1000 mg of Vitamin C with every meal (3 times a day) the day prior to your procedure. Antibiotics: Inform the nursing staff if you are taking any antibiotics or if you have any conditions that may require antibiotics prior to procedures. (Example: recent joint implants)   Pregnancy: If you are pregnant make sure to notify the nursing staff. Not doing  so may result in injury to the fetus, including death.  Sickness: If you have a cold, fever, or any active infections, call and cancel or reschedule your procedure. Receiving steroids while having an infection may result in complications. Arrival: You must be in the facility at least 30 minutes prior to your scheduled procedure. Tardiness: Your scheduled time is also the cutoff time. If you do not arrive at least 15 minutes prior to your procedure, you will be rescheduled.  Children: Do not bring any children with you. Make arrangements to keep them home. Dress appropriately: There is always a possibility that your clothing may get soiled. Avoid long dresses. Valuables: Do not bring any jewelry or valuables.  Reasons to call and reschedule or cancel your procedure: (Following these recommendations will minimize the risk of a serious complication.) Surgeries: Avoid having procedures within 2 weeks of any surgery. (Avoid for 2 weeks before or after any surgery). Flu Shots: Avoid having procedures within 2 weeks of a flu shots or . (Avoid for 2 weeks before or after immunizations). Barium: Avoid having a procedure within 7-10 days after having had a radiological study involving the use of radiological contrast. (Myelograms, Barium swallow or enema study). Heart attacks: Avoid any elective procedures or  surgeries for the initial 6 months after a Myocardial Infarction (Heart Attack). Blood thinners: It is imperative that you stop these medications before procedures. Let us  know if you if you take any blood thinner.  Infection: Avoid procedures during or within two weeks of an infection (including chest colds or gastrointestinal problems). Symptoms associated with infections include: Localized redness, fever, chills, night sweats or profuse sweating, burning sensation when voiding, cough, congestion, stuffiness, runny nose, sore throat, diarrhea, nausea, vomiting, cold or Flu symptoms, recent or current infections. It is specially important if the infection is over the area that we intend to treat. Heart and lung problems: Symptoms that may suggest an active cardiopulmonary problem include: cough, chest pain, breathing difficulties or shortness of breath, dizziness, ankle swelling, uncontrolled high or unusually low blood pressure, and/or palpitations. If you are experiencing any of these symptoms, cancel your procedure and contact your primary care physician for an evaluation.  Remember:  Regular Business hours are:  Monday to Thursday 8:00 AM to 4:00 PM  Provider's Schedule: Eric Como, MD:  Procedure days: Tuesday and Thursday 7:30 AM to 4:00 PM  Wallie Crystin, MD:  Procedure days: Monday and Wednesday 7:30 AM to 4:00 PM Last  Updated: 04/11/2023 ______________________________________________________________________     ______________________________________________________________________    Blood Thinners  IMPORTANT NOTICE:  If you take any of these, make sure to notify the nursing staff.  Failure to do so may result in serious injury.  Recommended time intervals to stop and restart blood-thinners, before & after invasive procedures  Generic Name Brand Name Pre-procedure: Stop medication for this amount of time before your procedure: Post-procedure: Wait this amount of time  after the procedure before restarting your medication:  Abciximab Reopro 15 days 2 hrs  Alteplase Activase 10 days 10 days  Anagrelide Agrylin    Apixaban  Eliquis  3 days 6 hrs  Cilostazol Pletal 3 days 5 hrs  Clopidogrel Plavix 7-10 days 2 hrs  Dabigatran Pradaxa 5 days 6 hrs  Dalteparin Fragmin 24 hours 4 hrs  Dipyridamole Aggrenox 11days 2 hrs  Edoxaban Lixiana; Savaysa 3 days 2 hrs  Enoxaparin  Lovenox 24 hours 4 hrs  Eptifibatide Integrillin 8 hours 2 hrs  Fondaparinux  Arixtra 72 hours 12 hrs  Hydroxychloroquine Plaquenil 11 days   Prasugrel Effient 7-10 days 6 hrs  Reteplase Retavase 10 days 10 days  Rivaroxaban Xarelto 3 days 6 hrs  Ticagrelor Brilinta 5-7 days 6 hrs  Ticlopidine Ticlid 10-14 days 2 hrs  Tinzaparin Innohep 24 hours 4 hrs  Tirofiban Aggrastat 8 hours 2 hrs  Warfarin Coumadin  5 days 2 hrs   Other medications with blood-thinning effects  NOTE: Consider stopping these if you have prolonged bleeding despite not taking any of the above blood thinners. Otherwise ask your provider and this will be decided on a case-by-case basis.  Product indications Generic (Brand) names Note  Cholesterol Lipitor Stop 4 days before procedure  Blood thinner (injectable) Heparin (LMW or LMWH Heparin) Stop 24 hours before procedure  Cancer Ibrutinib (Imbruvica) Stop 7 days before procedure  Malaria/Rheumatoid Hydroxychloroquine (Plaquenil) Stop 11 days before procedure  Thrombolytics  10 days before or after procedures   Over-the-counter (OTC) Products with blood-thinning effects  NOTE: Consider stopping these if you have prolonged bleeding despite not taking any of the above blood thinners. Otherwise ask your provider and this will be decided on a case-by-case basis.  Product Common names Stop Time  Aspirin  > 325 mg Goody Powders, Excedrin, etc. 11 days  Aspirin  <= 81 mg  7 days  Fish oil  4 days  Garlic supplements  7 days  Ginkgo biloba  36 hours  Ginseng  24 hours  NSAIDs  Ibuprofen, Naprosyn, etc. 3 days  Vitamin E  4 days   ______________________________________________________________________

## 2024-04-29 NOTE — Progress Notes (Signed)
 Nursing Pain Medication Assessment:  Safety precautions to be maintained throughout the outpatient stay will include: orient to surroundings, keep bed in low position, maintain call bell within reach at all times, provide assistance with transfer out of bed and ambulation.  Medication Inspection Compliance: Pill count conducted under aseptic conditions, in front of the patient. Neither the pills nor the bottle was removed from the patient's sight at any time. Once count was completed pills were immediately returned to the patient in their original bottle.  Medication: Hydrocodone /APAP Pill/Patch Count: 0 of 60 pills/patches remain Pill/Patch Appearance: Markings consistent with prescribed medication Bottle Appearance: Standard pharmacy container. Clearly labeled. Filled Date: 1 / 06 / 2025 Last Medication intake:  Today

## 2024-04-30 ENCOUNTER — Ambulatory Visit: Admitting: Orthopedic Surgery

## 2024-05-01 ENCOUNTER — Other Ambulatory Visit: Payer: Self-pay | Admitting: Nurse Practitioner

## 2024-05-01 ENCOUNTER — Other Ambulatory Visit: Payer: Self-pay | Admitting: Internal Medicine

## 2024-05-01 DIAGNOSIS — E1169 Type 2 diabetes mellitus with other specified complication: Secondary | ICD-10-CM

## 2024-05-01 DIAGNOSIS — G8929 Other chronic pain: Secondary | ICD-10-CM

## 2024-05-01 NOTE — Telephone Encounter (Signed)
 Ruel has send a message to [facility] about this

## 2024-05-03 ENCOUNTER — Telehealth: Payer: Self-pay

## 2024-05-03 NOTE — Telephone Encounter (Signed)
 Ordered by DFK.

## 2024-05-06 ENCOUNTER — Telehealth: Payer: Self-pay

## 2024-05-06 NOTE — Telephone Encounter (Signed)
 Completed P.A. for patient's Mounjaro.

## 2024-05-06 NOTE — Telephone Encounter (Signed)
 She stated she is seeing someone at [facility] and they up her back to 15 mg, patient was advised to call them and get them to up her medication since we didn't change it and she is suppose to be seeing them for her diabetes.

## 2024-05-06 NOTE — Telephone Encounter (Signed)
 Sent message to [employer] for shower chair

## 2024-05-14 ENCOUNTER — Ambulatory Visit: Admitting: Internal Medicine

## 2024-05-14 ENCOUNTER — Telehealth: Payer: Self-pay | Admitting: *Deleted

## 2024-05-14 ENCOUNTER — Telehealth: Payer: Self-pay | Admitting: Student in an Organized Health Care Education/Training Program

## 2024-05-14 ENCOUNTER — Encounter: Payer: Self-pay | Admitting: Internal Medicine

## 2024-05-14 VITALS — BP 136/93 | HR 85 | Temp 98.0°F | Resp 16 | Ht 65.0 in | Wt 248.0 lb

## 2024-05-14 DIAGNOSIS — R0602 Shortness of breath: Secondary | ICD-10-CM

## 2024-05-14 DIAGNOSIS — G4733 Obstructive sleep apnea (adult) (pediatric): Secondary | ICD-10-CM

## 2024-05-14 DIAGNOSIS — J4520 Mild intermittent asthma, uncomplicated: Secondary | ICD-10-CM

## 2024-05-14 MED ORDER — DUPIXENT 200 MG/1.14ML ~~LOC~~ SOAJ: 200.0000 mg | Freq: Once | SUBCUTANEOUS | 0 refills | Status: AC

## 2024-05-14 NOTE — Telephone Encounter (Signed)
 Returned patient call, Patient would like to have valium  prior to procedure.  I told patient that she could have a light meal that morning and keep her medicines the same with the exception of the eliquis  which she will hold x 3 days prior to procedure.  Patient verbalizes u/o information.

## 2024-05-14 NOTE — Patient Instructions (Signed)
 Asthma, Adult  Asthma is a condition that causes swelling and narrowing of the airways. These are the passages that lead from the nose and mouth down into the lungs. When asthma symptoms get worse it is called an asthma attack or flare. This can make it hard to breathe. Asthma flares can range from minor to life-threatening. There is no cure for asthma, but medicines and lifestyle changes can help to control it. What are the causes? It is not known exactly what causes asthma, but certain things can cause asthma symptoms to get worse (triggers). What can trigger an asthma attack? Cigarette smoke. Mold. Dust. Your pet's skin flakes (dander). Cockroaches. Pollen. Air pollution (like household cleaners, wood smoke, smog, or Therapist, occupational). What are the signs or symptoms? Trouble breathing (shortness of breath). Coughing. Making high-pitched whistling sounds when you breathe, most often when you breathe out (wheezing). Chest tightness. Tiredness with little activity. Poor exercise tolerance. How is this treated? Controller medicines that help prevent asthma symptoms. Fast-acting reliever or rescue medicines. These give short-term relief of asthma symptoms. Allergy medicines if your attacks are brought on by allergens. Medicines to help control the body's defense (immune) system. Staying away from the things that cause asthma attacks. Follow these instructions at home: Avoiding triggers in your home Do not allow anyone to smoke in your home. Limit use of fireplaces and wood stoves. Get rid of pests (such as roaches and mice) and their droppings. Keep your home clean. Clean your floors. Dust regularly. Use cleaning products that do not smell. Wash bed sheets and blankets every week in hot water. Dry them in a dryer. Have someone vacuum when you are not home. Change your heating and air conditioning filters often. Use blankets that are made of polyester or cotton. General  instructions Take over-the-counter and prescription medicines only as told by your doctor. Do not smoke or use any products that contain nicotine or tobacco. If you need help quitting, ask your doctor. Stay away from secondhand smoke. Avoid doing things outdoors when allergen counts are high and when air quality is low. Warm up before you exercise. Take time to cool down after exercise. Use a peak flow meter as told by your doctor. A peak flow meter is a tool that measures how well your lungs are working. Keep track of the peak flow meter's readings. Write them down. Follow your asthma action plan. This is a written plan for taking care of your asthma and treating your attacks. Make sure you get all the shots (vaccines) that your doctor recommends. Ask your doctor about a flu shot and a pneumonia shot. Keep all follow-up visits. Contact a doctor if: You have wheezing, shortness of breath, or a cough even while taking medicine to prevent attacks. The mucus you cough up (sputum) is thicker than usual. The mucus you cough up changes from clear or white to yellow, green, gray, or is bloody. You have problems from the medicine you are taking, such as: A rash. Itching. Swelling. Trouble breathing. You need reliever medicines more than 2-3 times a week. Your peak flow reading is still at 50-79% of your personal best after following the action plan for 1 hour. You have a fever. Get help right away if: You seem to be worse and are not responding to medicine during an asthma attack. You are short of breath even at rest. You get short of breath when doing very little activity. You have trouble eating, drinking, or talking. You have chest  pain or tightness. You have a fast heartbeat. Your lips or fingernails start to turn blue. You are light-headed or dizzy, or you faint. Your peak flow is less than 50% of your personal best. You feel too tired to breathe normally. These symptoms may be an  emergency. Get help right away. Call 911. Do not wait to see if the symptoms will go away. Do not drive yourself to the hospital. Summary Asthma is a long-term (chronic) condition in which the airways get tight and narrow. An asthma attack can make it hard to breathe. Asthma cannot be cured, but medicines and lifestyle changes can help control it. Make sure you understand how to avoid triggers and how and when to use your medicines. Avoid things that can cause allergy symptoms (allergens). These include animal skin flakes (dander) and pollen from trees or grass. Avoid things that pollute the air. These may include household cleaners, wood smoke, smog, or chemical odors. This information is not intended to replace advice given to you by your health care provider. Make sure you discuss any questions you have with your health care provider. Document Revised: 01/25/2021 Document Reviewed: 01/25/2021 Elsevier Patient Education  2024 [employer].

## 2024-05-14 NOTE — Telephone Encounter (Signed)
 Pt would like to know how much insulin  to take before procedure.

## 2024-05-14 NOTE — Progress Notes (Unsigned)
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 03/18/2014   COPD with acute exacerbation (HCC) 05/01/2013   Acne 09/12/2012   Hand dermatitis 09/12/2012   DVT (deep venous thrombosis) (HCC) 08/31/2012   Constipation 08/16/2012   Gastroesophageal reflux disease without  esophagitis 08/16/2012   Asthma 04/10/2012   Tobacco use disorder 04/02/2012   Morbid obesity (HCC) 03/01/2012   Dermatitis 01/11/2012   Depressive disorder 06/22/2010   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 SOB (shortness of breath) (Primary) *** - Spirometry with graph  2. Chronic asthma, mild intermittent, uncomplicated *** - Dupilumab  (DUPIXENT ) 200 MG/1. SOAJ; Inject 200 mg into the skin once for 1 dose.  Dispense: 2 mL; Refill: 0  3. OSA (obstructive sleep apnea) ***  4. Obesity, morbid (HCC) ***    General Counseling: I have discussed the  findings of the evaluation and examination with Joen.  I have also discussed any further diagnostic evaluation thatmay be needed or ordered today. Lajeana verbalizes understanding of the findings of todays visit. We also reviewed her medications today and discussed drug interactions and side effects including but not limited excessive drowsiness and altered mental states. We also discussed that there is always a risk not just to her but also people around her. she has been encouraged to call the office with any questions or concerns that should arise related to todays visit.  Orders Placed This Encounter  Procedures   Spirometry with graph    Where should this test be performed?:   [facility]     Time spent: ***  I have personally obtained a history, examined the patient, evaluated laboratory and imaging results, formulated the assessment and plan and placed orders.    Elfreda DELENA Bathe, MD [facility] Sleep medicine

## 2024-05-14 NOTE — Telephone Encounter (Signed)
 I have spoken with provider and received approval to stop blood thinner, eliquis  x 3 days.  I have called to patient with instructions re; this information.  Voicemail left for patient and asked to call if there are further questions.  Patient is to miss 3 doses of her eliquis  prior to procedure.

## 2024-05-15 ENCOUNTER — Ambulatory Visit: Admitting: Student in an Organized Health Care Education/Training Program

## 2024-05-22 ENCOUNTER — Ambulatory Visit
Admission: RE | Admit: 2024-05-22 | Discharge: 2024-05-22 | Disposition: A | Discharge status: Home / Self Care | Source: Ambulatory Visit | Attending: Student in an Organized Health Care Education/Training Program | Admitting: Student in an Organized Health Care Education/Training Program

## 2024-05-22 ENCOUNTER — Encounter: Payer: Self-pay | Admitting: Student in an Organized Health Care Education/Training Program

## 2024-05-22 ENCOUNTER — Encounter: Admitting: Nurse Practitioner

## 2024-05-22 ENCOUNTER — Ambulatory Visit (HOSPITAL_BASED_OUTPATIENT_CLINIC_OR_DEPARTMENT_OTHER): Admitting: Student in an Organized Health Care Education/Training Program

## 2024-05-22 ENCOUNTER — Telehealth: Payer: Self-pay

## 2024-05-22 DIAGNOSIS — G894 Chronic pain syndrome: Secondary | ICD-10-CM | POA: Insufficient documentation

## 2024-05-22 DIAGNOSIS — M47816 Spondylosis without myelopathy or radiculopathy, lumbar region: Secondary | ICD-10-CM | POA: Insufficient documentation

## 2024-05-22 MED ORDER — MIDAZOLAM HCL (PF) 2 MG/2ML IJ SOLN
0.5000 mg | Freq: Once | INTRAMUSCULAR | Status: DC
  Filled 2024-05-22: qty 2

## 2024-05-22 MED ORDER — HYDROCODONE-ACETAMINOPHEN 5-325 MG PO TABS: 1.0000 | ORAL_TABLET | Freq: Three times a day (TID) | ORAL | 0 refills | Status: AC | PRN

## 2024-05-22 MED ORDER — FENTANYL CITRATE (PF) 100 MCG/2ML IJ SOLN
25.0000 ug | INTRAMUSCULAR | Status: DC | PRN
  Filled 2024-05-22: qty 2

## 2024-05-22 MED ORDER — ROPIVACAINE HCL 2 MG/ML IJ SOLN
18.0000 mL | Freq: Once | INTRAMUSCULAR | Status: DC
  Filled 2024-05-22: qty 20

## 2024-05-22 MED ORDER — LACTATED RINGERS IV SOLN: Freq: Once | INTRAVENOUS | Status: DC

## 2024-05-22 MED ORDER — DEXAMETHASONE SOD PHOSPHATE PF 10 MG/ML IJ SOLN
20.0000 mg | Freq: Once | INTRAMUSCULAR | Status: DC
  Filled 2024-05-22: qty 2

## 2024-05-22 MED ORDER — LIDOCAINE HCL 2 % IJ SOLN
20.0000 mL | Freq: Once | INTRAMUSCULAR | Status: DC
  Filled 2024-05-22: qty 20

## 2024-05-22 NOTE — Telephone Encounter (Signed)
 Completed appeal for patient's Dupixent .

## 2024-05-22 NOTE — Progress Notes (Signed)
 [id number] Dr. Marcelino aware of patient nose bleed after getting her in the prone position. Patient denies any symptoms ie HA, blurred vision or epigastric pain. Decision to proceed per Dr. Marcelino.  0935 IV infiltrated. Discontinued and restart in left AC unsuccessful Dr. Marcelino aware. Patient agrees to proceed without sedation.

## 2024-05-22 NOTE — Progress Notes (Signed)
 Safety precautions to be maintained throughout the outpatient stay will include: orient to surroundings, keep bed in low position, maintain call bell within reach at all times, provide assistance with transfer out of bed and ambulation.

## 2024-05-22 NOTE — Patient Instructions (Signed)
 " ______________________________________________________________________    Post-Procedure Discharge Instructions  INSTRUCTIONS Apply ice:  Purpose: This will minimize any swelling and discomfort after procedure.  When: Day of procedure, as soon as you get home. How: Fill a plastic sandwich bag with crushed ice. Cover it with a small towel and apply to injection site. How long: (15 min on, 15 min off) Apply for 15 minutes then remove x 15 minutes.  Repeat sequence on day of procedure, until you go to bed. Apply heat:  Purpose: To treat any soreness and discomfort from the procedure. When: Starting the next day after the procedure. How: Apply heat to procedure site starting the day following the procedure. How long: May continue to repeat daily, until discomfort goes away. Food intake: Start with clear liquids (like water) and advance to regular food, as tolerated.  Physical activities: Keep activities to a minimum for the first 8 hours after the procedure. After that, then as tolerated. Driving: If you have received any sedation, be responsible and do not drive. You are not allowed to drive for 24 hours after having sedation. Blood thinner: (Applies only to those taking blood thinners) You may restart your blood thinner 6 hours after your procedure. Insulin : (Applies only to Diabetic patients taking insulin ) As soon as you can eat, you may resume your normal dosing schedule. Infection prevention: Keep procedure site clean and dry. Shower daily and clean area with soap and water.  PAIN DIARY Post-procedure Pain Diary: Extremely important that this be done correctly and accurately. Recorded information will be used to determine the next step in treatment. For the purpose of accuracy, follow these rules: Evaluate only the area treated. Do not report or include pain from an untreated area. For the purpose of this evaluation, ignore all other areas of pain, except for the treated area. After your  procedure, avoid taking a long nap and attempting to complete the pain diary after you wake up. Instead, set your alarm clock to go off every hour, on the hour, for the initial 8 hours after the procedure. Document the duration of the numbing medicine, and the relief you are getting from it. Do not go to sleep and attempt to complete it later. It will not be accurate. If you received sedation, it is likely that you were given a medication that may cause amnesia. Because of this, completing the diary at a later time may cause the information to be inaccurate. This information is needed to plan your care. Follow-up appointment: Keep your post-procedure follow-up evaluation appointment after the procedure (usually 2 weeks for most procedures, 6 weeks for radiofrequencies). DO NOT FORGET to bring you pain diary with you.   EXPECT... (What should I expect to see with my procedure?) From numbing medicine (AKA: Local Anesthetics): Numbness or decrease in pain. You may also experience some weakness, which if present, could last for the duration of the local anesthetic. Onset: Full effect within 15 minutes of injected. Duration: It will depend on the type of local anesthetic used. On the average, 1 to 8 hours.  From steroids (Applies only if steroids were used): Decrease in swelling or inflammation. Once inflammation is improved, relief of the pain will follow. Onset of benefits: Depends on the amount of swelling present. The more swelling, the longer it will take for the benefits to be seen. In some cases, up to 10 days. Duration: Steroids will stay in the system x 2 weeks. Duration of benefits will depend on multiple posibilities including persistent  irritating factors. Side-effects: If present, they may typically last 2 weeks (the duration of the steroids). Frequent: Cramps (if they occur, drink Gatorade and take over-the-counter Magnesium 450-500 mg once to twice a day); water retention with temporary weight  gain; increases in blood sugar; decreased immune system response; increased appetite. Occasional: Facial flushing (red, warm cheeks); mood swings; menstrual changes. Uncommon: Long-term decrease or suppression of natural hormones; bone thinning. (These are more common with higher doses or more frequent use. This is why we prefer that our patients avoid having any injection therapies in other practices.)  Very Rare: Severe mood changes; psychosis; aseptic necrosis. From procedure: Some discomfort is to be expected once the numbing medicine wears off. This should be minimal if ice and heat are applied as instructed.  CALL IF... (When should I call?) You experience numbness and weakness that gets worse with time, as opposed to wearing off. New onset bowel or bladder incontinence. (Applies only to procedures done in the spine)  Emergency Numbers: Durning business hours (Monday - Thursday, 8:00 AM - 4:00 PM) (Friday, 9:00 AM - 12:00 Noon): [phone] After hours: [phone] NOTE: If you are having a problem and are unable connect with, or to talk to a provider, then go to your nearest urgent care or emergency department. If the problem is serious and urgent, please call 911. ______________________________________________________________________     Procedure instructions  Do not eat or drink fluids (other than water) for 6 hours before your procedure  No water for 2 hours before your procedure  Take your blood pressure medicine with a sip of water  Arrive 30 minutes before your appointment  Carefully read the Preparing for your procedure detailed instructions  If you have questions call us  at [phone]  _____________________________________________________________________    ______________________________________________________________________  Preparing for your procedure  Appointments: If you think you may not be able to keep your appointment, call 24-48 hours in  advance to cancel. We need time to make it available to others.  During your procedure appointment there will be: No Prescription Refills. No disability issues to discussed. No medication changes or discussions.  Instructions: Food intake: Avoid eating anything solid for at least 8 hours prior to your procedure. Clear liquid intake: You may take clear liquids such as water up to 2 hours prior to your procedure. (No carbonated drinks. No soda.) Transportation: Unless otherwise stated by your physician, bring a driver. Morning Medicines: Except for blood thinners, take all of your other morning medications with a sip of water. Make sure to take your heart and blood pressure medicines. If your blood pressure's lower number is above 100, the case will be rescheduled. Blood thinners: Make sure to stop your blood thinners as instructed.  If you take a blood thinner, but were not instructed to stop it, call our office [phone] and ask to talk to a nurse. Not stopping a blood thinner prior to certain procedures could lead to serious complications. Diabetics on insulin : Notify the staff so that you can be scheduled 1st case in the morning. If your diabetes requires high dose insulin , take only  of your normal insulin  dose the morning of the procedure and notify the staff that you have done so. Preventing infections: Shower with an antibacterial soap the morning of your procedure.  Build-up your immune system: Take 1000 mg of Vitamin C with every meal (3 times a day) the day prior to your procedure. Antibiotics: Inform the nursing staff if you are  taking any antibiotics or if you have any conditions that may require antibiotics prior to procedures. (Example: recent joint implants)   Pregnancy: If you are pregnant make sure to notify the nursing staff. Not doing so may result in injury to the fetus, including death.  Sickness: If you have a cold, fever, or any active infections, call and cancel or  reschedule your procedure. Receiving steroids while having an infection may result in complications. Arrival: You must be in the facility at least 30 minutes prior to your scheduled procedure. Tardiness: Your scheduled time is also the cutoff time. If you do not arrive at least 15 minutes prior to your procedure, you will be rescheduled.  Children: Do not bring any children with you. Make arrangements to keep them home. Dress appropriately: There is always a possibility that your clothing may get soiled. Avoid long dresses. Valuables: Do not bring any jewelry or valuables.  Reasons to call and reschedule or cancel your procedure: (Following these recommendations will minimize the risk of a serious complication.) Surgeries: Avoid having procedures within 2 weeks of any surgery. (Avoid for 2 weeks before or after any surgery). Flu Shots: Avoid having procedures within 2 weeks of a flu shots or . (Avoid for 2 weeks before or after immunizations). Barium: Avoid having a procedure within 7-10 days after having had a radiological study involving the use of radiological contrast. (Myelograms, Barium swallow or enema study). Heart attacks: Avoid any elective procedures or surgeries for the initial 6 months after a Myocardial Infarction (Heart Attack). Blood thinners: It is imperative that you stop these medications before procedures. Let us  know if you if you take any blood thinner.  Infection: Avoid procedures during or within two weeks of an infection (including chest colds or gastrointestinal problems). Symptoms associated with infections include: Localized redness, fever, chills, night sweats or profuse sweating, burning sensation when voiding, cough, congestion, stuffiness, runny nose, sore throat, diarrhea, nausea, vomiting, cold or Flu symptoms, recent or current infections. It is specially important if the infection is over the area that we intend to treat. Heart and lung problems: Symptoms that may  suggest an active cardiopulmonary problem include: cough, chest pain, breathing difficulties or shortness of breath, dizziness, ankle swelling, uncontrolled high or unusually low blood pressure, and/or palpitations. If you are experiencing any of these symptoms, cancel your procedure and contact your primary care physician for an evaluation.  Remember:  Regular Business hours are:  Monday to Thursday 8:00 AM to 4:00 PM  Provider's Schedule: Eric Como, MD:  Procedure days: Tuesday and Thursday 7:30 AM to 4:00 PM  Wallie Mei, MD:  Procedure days: Monday and Wednesday 7:30 AM to 4:00 PM  "

## 2024-05-22 NOTE — Progress Notes (Signed)
 PROVIDER NOTE: Interpretation of information contained herein should be left to medically-trained personnel. Specific patient instructions are provided elsewhere under Patient Instructions section of medical record. This document was created in part using STT-dictation technology, any transcriptional errors that may result from this process are unintentional.  Patient: Brenda Santana Type: Established DOB: 08-02-1960 MRN: [id number] PCP: Liana Fish, NP  Service: Procedure DOS: 05/22/2024 Setting: Ambulatory Location: Ambulatory outpatient facility Delivery: Face-to-face Provider: Wallie Sharran, MD Specialty: Interventional Pain Management Specialty designation: 09 Location: Outpatient facility Ref. Prov.: Patel, Seema K, NP       Interventional Therapy   Type: Lumbar Facet, Medial Branch Block(s) (w/ fluoroscopic mapping) #1  Laterality: Bilateral  Level: L3, L4, and L5 Medial Branch/Dorsal Rami Level(s). Injecting these levels blocks the L3-4 and L4-5 lumbar facet joints. Imaging: Fluoroscopic guidance Spinal ([id number]) Anesthesia: Local anesthesia (1-2% Lidocaine ) DOS: 05/22/2024 Performed by: Wallie Valentina, MD  Primary Purpose: Diagnostic/Therapeutic Indications: Low back pain severe enough to impact quality of life or function. 1. Lumbar facet arthropathy   2. Lumbar spondylosis   3. Chronic pain syndrome    NAS-11 Pain score:   Pre-procedure: 7 /10   Post-procedure: 7 /10   Patient stopped Eliquis  3 days prior.    Position / Prep / Materials:  Position: Prone  Prep solution: ChloraPrep (2% chlorhexidine  gluconate and 70% isopropyl alcohol) Area Prepped: Posterolateral Lumbosacral Spine (Wide prep: From the lower border of the scapula down to the end of the tailbone and from flank to flank.)  Materials:  Tray: Block Needle(s):  Type: Spinal  Gauge (G): 22  Length: 5-in Qty: 3     H&P (Pre-op Assessment):  Ms. Puryear is a 64 y.o. (year old), female patient,  seen today for interventional treatment. She  has a past surgical history that includes Tubal ligation; right arm surgery; Colonoscopy with propofol  (N/A, 02/02/2018); laparoscopic appendectomy (N/A, 02/05/2018); Appendectomy; Esophagogastroduodenoscopy (egd) with propofol  (N/A, 02/08/2020); and Esophagogastroduodenoscopy (egd) with propofol  (N/A, 06/15/2020). Ms. Graff has a current medication list which includes the following prescription(s): accu-chek softclix lancets, acyclovir , albuterol , allopurinol , aspirin  ec, atorvastatin , bupropion , cetirizine , dapagliflozin  propanediol, diclofenac sodium, doxycycline , eliquis , famotidine , iron, trelegy ellipta , furosemide , gabapentin , accu-chek guide test, lantus  solostar, insulin  pen needle, ipratropium-albuterol , linaclotide , lisinopril , metoprolol  tartrate, montelukast , oxymetazoline , pantoprazole , sumatriptan , tdap, tirzepatide , tizanidine , topiramate , [START ON 06/05/2024] hydrocodone -acetaminophen , [START ON 07/05/2024] hydrocodone -acetaminophen , and prednisone , and the following Facility-Administered Medications: dexamethasone , lactated ringers , lidocaine , and ropivacaine  (pf) 2 mg/ml (0.2%). Her primarily concern today is the Back Pain (Lumbar biateral )  Initial Vital Signs:  Pulse/HCG Rate: 93ECG Heart Rate: 94 Temp: 98.4 F (36.9 C) Resp: 16 BP: 127/77 SpO2: 100 %  BMI: Estimated body mass index is 41.1 kg/m as calculated from the following:   Height as of this encounter: 5' 5 (1.651 m).   Weight as of this encounter: 247 lb (112 kg).  Risk Assessment: Allergies: Reviewed. She has no known allergies.  Allergy Precautions: None required Coagulopathies: Reviewed. None identified.  Blood-thinner therapy: None at this time Active Infection(s): Reviewed. None identified. Ms. Mathison is afebrile  Site Confirmation: Ms. Usman was asked to confirm the procedure and laterality before marking the site Procedure checklist: Completed Consent: Before the  procedure and under the influence of no sedative(s), amnesic(s), or anxiolytics, the patient was informed of the treatment options, risks and possible complications. To fulfill our ethical and legal obligations, as recommended by the American Medical Association's Code of Ethics, I have informed the patient of my clinical impression; the nature and  purpose of the treatment or procedure; the risks, benefits, and possible complications of the intervention; the alternatives, including doing nothing; the risk(s) and benefit(s) of the alternative treatment(s) or procedure(s); and the risk(s) and benefit(s) of doing nothing. The patient was provided information about the general risks and possible complications associated with the procedure. These may include, but are not limited to: failure to achieve desired goals, infection, bleeding, organ or nerve damage, allergic reactions, paralysis, and death. In addition, the patient was informed of those risks and complications associated to Spine-related procedures, such as failure to decrease pain; infection (i.e.: Meningitis, epidural or intraspinal abscess); bleeding (i.e.: epidural hematoma, subarachnoid hemorrhage, or any other type of intraspinal or peri-dural bleeding); organ or nerve damage (i.e.: Any type of peripheral nerve, nerve root, or spinal cord injury) with subsequent damage to sensory, motor, and/or autonomic systems, resulting in permanent pain, numbness, and/or weakness of one or several areas of the body; allergic reactions; (i.e.: anaphylactic reaction); and/or death. Furthermore, the patient was informed of those risks and complications associated with the medications. These include, but are not limited to: allergic reactions (i.e.: anaphylactic or anaphylactoid reaction(s)); adrenal axis suppression; blood sugar elevation that in diabetics may result in ketoacidosis or comma; water retention that in patients with history of congestive heart failure  may result in shortness of breath, pulmonary edema, and decompensation with resultant heart failure; weight gain; swelling or edema; medication-induced neural toxicity; particulate matter embolism and blood vessel occlusion with resultant organ, and/or nervous system infarction; and/or aseptic necrosis of one or more joints. Finally, the patient was informed that Medicine is not an exact science; therefore, there is also the possibility of unforeseen or unpredictable risks and/or possible complications that may result in a catastrophic outcome. The patient indicated having understood very clearly. We have given the patient no guarantees and we have made no promises. Enough time was given to the patient to ask questions, all of which were answered to the patient's satisfaction. Ms. Spain has indicated that she wanted to continue with the procedure. Attestation: I, the ordering provider, attest that I have discussed with the patient the benefits, risks, side-effects, alternatives, likelihood of achieving goals, and potential problems during recovery for the procedure that I have provided informed consent. Date  Time: 05/22/2024  8:49 AM  Pre-Procedure Preparation:  Monitoring: As per clinic protocol. Respiration, ETCO2, SpO2, BP, heart rate and rhythm monitor placed and checked for adequate function Safety Precautions: Patient was assessed for positional comfort and pressure points before starting the procedure. Time-out: I initiated and conducted the Time-out before starting the procedure, as per protocol. The patient was asked to participate by confirming the accuracy of the Time Out information. Verification of the correct person, site, and procedure were performed and confirmed by me, the nursing staff, and the patient. Time-out conducted as per Joint Commission's Universal Protocol (UP.01.01.01). Time: 0938 Start Time: 0938 hrs.  Description of Procedure:          Laterality: (see  above) Targeted Levels: (see above)  Safety Precautions: Aspiration looking for blood return was conducted prior to all injections. At no point did we inject any substances, as a needle was being advanced. Before injecting, the patient was told to immediately notify me if she was experiencing any new onset of ringing in the ears, or metallic taste in the mouth. No attempts were made at seeking any paresthesias. Safe injection practices and needle disposal techniques used. Medications properly checked for expiration dates. SDV (single dose vial)  medications used. After the completion of the procedure, all disposable equipment used was discarded in the proper designated medical waste containers. Local Anesthesia: Protocol guidelines were followed. The patient was positioned over the fluoroscopy table. The area was prepped in the usual manner. The time-out was completed. The target area was identified using fluoroscopy. A 12-in long, straight, sterile hemostat was used with fluoroscopic guidance to locate the targets for each level blocked. Once located, the skin was marked with an approved surgical skin marker. Once all sites were marked, the skin (epidermis, dermis, and hypodermis), as well as deeper tissues (fat, connective tissue and muscle) were infiltrated with a small amount of a short-acting local anesthetic, loaded on a 10cc syringe with a 25G, 1.5-in  Needle. An appropriate amount of time was allowed for local anesthetics to take effect before proceeding to the next step. Local Anesthetic: Lidocaine  2.0% The unused portion of the local anesthetic was discarded in the proper designated containers. Technical description of process:  Medial Branch  Dorsal Rami Nerve Block (MBB):  Neuroanatomy note: Each lumbar facet joint receives dual innervation from medial branches arising from the posterior primary rami at the same level and one level above. The target for each lumbar medial branch is the junction  of the ipsilateral superior articular and transverse process of the lower vertebral body. (i.e.: The L4-L5 facet joint is innervated by the L4 medial branch [located at L5] and the L3 medial branch [located at L4]. Blocking the L4 Medial Branch is therefore achieved by injecting at the junction of the ipsilateral superior articular and transverse process of the lower vertebral body [L5].).  Exception: The exception to the above rule is the L5-S1 facet joint which has triple innervation requiring the L4 medial branch, as well as the L5 and the S1 Dorsal Rami(s) to be blocked to fully denervate the joint.  Under fluoroscopic guidance, a needle was inserted until contact was made with os over the target area. After negative aspiration, 2mL of the nerve block solution was injected without difficulty or complication. Paresthesia were avoided during injection. The needle(s) were removed intact and without complication.  Once the entire procedure was completed, the treated area was cleaned, making sure to leave some of the prepping solution back to take advantage of its long term bactericidal properties.         Illustration of the posterior view of the lumbar spine and the posterior neural structures. Laminae of L2 through S1 are labeled. DPRL5, dorsal primary ramus of L5; DPRS1, dorsal primary ramus of S1; DPR3, dorsal primary ramus of L3; FJ, facet (zygapophyseal) joint L3-L4; I, inferior articular process of L4; LB1, lateral branch of dorsal primary ramus of L1; IAB, inferior articular branches from L3 medial branch (supplies L4-L5 facet joint); IBP, intermediate branch plexus; MB3, medial branch of dorsal primary ramus of L3; NR3, third lumbar nerve root; S, superior articular process of L5; SAB, superior articular branches from L4 (supplies L4-5 facet joint also); TP3, transverse process of L3.   Facet Joint Innervation (* possible contribution)  L1-2 T12, L1 (L2*)  Medial Branch  L2-3 L1, L2 (L3*)                      L3-4 L2, L3 (L4*)                     L4-5 L3, L4 (L5*)  L5-S1 L4, L5, S1                        Vitals:   05/22/24 0856 05/22/24 0935 05/22/24 0940 05/22/24 0945  BP: 127/77 (!) 153/87 (!) 159/93 (!) 164/89  Pulse: 93     Resp: 16 15 15 15   Temp: 98.4 F (36.9 C)     TempSrc: Temporal     SpO2: 100% 99% 99% 98%  Weight: 247 lb (112 kg)     Height: 5' 5 (1.651 m)        End Time: 0945 hrs.  Imaging Guidance (Spinal):         Type of Imaging Technique: Fluoroscopy Guidance (Spinal) Indication(s): Fluoroscopy guidance for needle placement to enhance accuracy in procedures requiring precise needle localization for targeted delivery of medication in or near specific anatomical locations not easily accessible without such real-time imaging assistance. Exposure Time: Please see nurses notes. Contrast: None used. Fluoroscopic Guidance: I was personally present during the use of fluoroscopy. Tunnel Vision Technique used to obtain the best possible view of the target area. Parallax error corrected before commencing the procedure. Direction-depth-direction technique used to introduce the needle under continuous pulsed fluoroscopy. Once target was reached, antero-posterior, oblique, and lateral fluoroscopic projection used confirm needle placement in all planes. Images permanently stored in EMR. Interpretation: No contrast injected. I personally interpreted the imaging intraoperatively. Adequate needle placement confirmed in multiple planes. Permanent images saved into the patient's record.  Post-operative Assessment:  Post-procedure Vital Signs:  Pulse/HCG Rate: 9392 Temp: 98.4 F (36.9 C) Resp: 15 BP: (!) 164/89 SpO2: 98 %  EBL: None  Complications: No immediate post-treatment complications observed by team, or reported by patient.  Note: The patient tolerated the entire procedure well. A repeat set of vitals were taken after the  procedure and the patient was kept under observation following institutional policy, for this type of procedure. Post-procedural neurological assessment was performed, showing return to baseline, prior to discharge. The patient was provided with post-procedure discharge instructions, including a section on how to identify potential problems. Should any problems arise concerning this procedure, the patient was given instructions to immediately contact us , at any time, without hesitation. In any case, we plan to contact the patient by telephone for a follow-up status report regarding this interventional procedure.  Comments:  No additional relevant information.  Plan of Care (POC)  Orders:  Orders Placed This Encounter  Procedures   DG PAIN CLINIC C-ARM 1-60 MIN NO REPORT    Intraoperative interpretation by procedural physician at [facility].    Standing Status:   Standing    Number of Occurrences:   1    Reason for exam::   Assistance in needle guidance and placement for procedures requiring needle placement in or near specific anatomical locations not easily accessible without such assistance.    Medications ordered for procedure: Meds ordered this encounter  Medications   lidocaine  (XYLOCAINE ) 2 % (with pres) injection 400 mg   lactated ringers  infusion   DISCONTD: midazolam  PF (VERSED ) injection 0.5-2 mg    Make sure Flumazenil is available in the pyxis when using this medication. If oversedation occurs, administer 0.2 mg IV over 15 sec. If after 45 sec no response, administer 0.2 mg again over 1 min; may repeat at 1 min intervals; not to exceed 4 doses (1 mg)   DISCONTD: fentaNYL  (SUBLIMAZE ) injection 25-50 mcg    Make sure Narcan is available in the  pyxis when using this medication. In the event of respiratory depression (RR< 8/min): Titrate NARCAN (naloxone) in increments of 0.1 to 0.2 mg IV at 2-3 minute intervals, until desired degree of reversal.   ropivacaine  (PF) 2  mg/mL (0.2%) (NAROPIN ) injection 18 mL   dexamethasone  (DECADRON ) injection 20 mg   HYDROcodone -acetaminophen  (NORCO/VICODIN) 5-325 MG tablet    Sig: Take 1 tablet by mouth every 8 (eight) hours as needed for severe pain (pain score 7-10). Must last 30 days    Dispense:  90 tablet    Refill:  0    Chronic Pain: STOP Act (Not applicable) Fill 1 day early if closed on refill date. Avoid benzodiazepines within 8 hours of opioids   HYDROcodone -acetaminophen  (NORCO/VICODIN) 5-325 MG tablet    Sig: Take 1 tablet by mouth every 8 (eight) hours as needed for severe pain (pain score 7-10). Must last 30 days    Dispense:  90 tablet    Refill:  0    Chronic Pain: STOP Act (Not applicable) Fill 1 day early if closed on refill date. Avoid benzodiazepines within 8 hours of opioids  Restart Plavix tomorrow  Medications administered: Joen DOROTHA Stacks had no medications administered during this visit.  See the medical record for exact dosing, route, and time of administration.   Follow-up plan:   Return in about 2 weeks (around 06/05/2024) for PPE, VV, B/L L3,4,5 MBNB.     Recent Visits Date Type Provider Dept  04/29/24 Office Visit Patel, Seema K, NP [facility]  04/01/24 Office Visit Patel, Seema K, NP [facility]  03/07/24 Office Visit Patel, Seema K, NP [facility]  02/29/24 Office Visit Marcelino Nurse, MD [facility]  Showing recent visits within past 90 days and meeting all other requirements Today's Visits Date Type Provider Dept  05/22/24 Procedure visit Marcelino Nurse, MD [facility]  Showing today's visits and meeting all other requirements Future Appointments Date Type Provider Dept  06/04/24 Appointment Marcelino Nurse, MD [facility]  07/29/24 Appointment Patel, Seema K, NP [facility]  Showing future appointments within next 90 days and meeting all other requirements   Disposition: Discharge home  Discharge (Date   Time): 05/22/2024; 1000 hrs.   Primary Care Physician: Liana Fish, NP Location: [facility] Note by: Nurse Marcelino, MD (TTS technology used. I apologize for any typographical errors that were not detected and corrected.) Date: 05/22/2024; Time: 10:12 AM  Disclaimer:  Medicine is not an visual merchandiser. The only guarantee in medicine is that nothing is guaranteed. It is important to note that the decision to proceed with this intervention was based on the information collected from the patient. The Data and conclusions were drawn from the patient's questionnaire, the interview, and the physical examination. Because the information was provided in large part by the patient, it cannot be guaranteed that it has not been purposely or unconsciously manipulated. Every effort has been made to obtain as much relevant data as possible for this evaluation. It is important to note that the conclusions that lead to this procedure are derived in large part from the available data. Always take into account that the treatment will also be dependent on availability of resources and existing treatment guidelines, considered by other Pain Management Practitioners as being common knowledge and practice, at the time of the intervention. For Medico-Legal purposes, it is also important to point out that variation in procedural techniques and pharmacological choices are the acceptable  norm. The indications, contraindications, technique, and results of the above procedure should only be interpreted and judged by a Board-Certified Interventional Pain Specialist with extensive familiarity and expertise in the same exact procedure and technique.

## 2024-05-23 ENCOUNTER — Other Ambulatory Visit: Payer: Self-pay

## 2024-05-23 ENCOUNTER — Telehealth: Payer: Self-pay | Admitting: *Deleted

## 2024-05-23 MED ORDER — DUPIXENT 200 MG/1.14ML ~~LOC~~ SOAJ: 200.0000 mg | SUBCUTANEOUS | 0 refills | Status: AC

## 2024-05-23 NOTE — Telephone Encounter (Signed)
 Post procedure call; no  questions or concerns.   Patient wanted to express her thanks for our help on yesterday.  She appreciated all of the staff and the results that she is experiencing.

## 2024-05-31 ENCOUNTER — Ambulatory Visit: Payer: 59 | Admitting: Nurse Practitioner

## 2024-05-31 ENCOUNTER — Encounter: Payer: Self-pay | Admitting: *Deleted

## 2024-05-31 NOTE — Progress Notes (Signed)
 Brenda Santana                                          MRN: [id number]   05/31/2024   The [facility] Quality Team Specialist reviewed this patient medical record for the purposes of chart review for care gap closure. The following were reviewed: chart review for care gap closure-glycemic status assessment.    [facility] Quality Team

## 2024-06-01 ENCOUNTER — Other Ambulatory Visit: Payer: Self-pay | Admitting: Nurse Practitioner

## 2024-06-01 DIAGNOSIS — E1165 Type 2 diabetes mellitus with hyperglycemia: Secondary | ICD-10-CM

## 2024-06-01 DIAGNOSIS — E1159 Type 2 diabetes mellitus with other circulatory complications: Secondary | ICD-10-CM

## 2024-06-01 DIAGNOSIS — Z79899 Other long term (current) drug therapy: Secondary | ICD-10-CM

## 2024-06-01 DIAGNOSIS — J449 Chronic obstructive pulmonary disease, unspecified: Secondary | ICD-10-CM

## 2024-06-01 DIAGNOSIS — I10 Essential (primary) hypertension: Secondary | ICD-10-CM

## 2024-06-03 ENCOUNTER — Telehealth: Payer: Self-pay | Admitting: Nurse Practitioner

## 2024-06-03 NOTE — Telephone Encounter (Signed)
 Pharmacy and patient called regarding the quantity for Dupilumab . Pharmacy stated this does not come in the quantity ordered. I s/w Nimisha, she will s/w dsk. I explained this to patient-Brenda Santana

## 2024-06-04 ENCOUNTER — Ambulatory Visit: Admitting: Student in an Organized Health Care Education/Training Program

## 2024-06-04 DIAGNOSIS — G894 Chronic pain syndrome: Secondary | ICD-10-CM

## 2024-06-04 DIAGNOSIS — M47816 Spondylosis without myelopathy or radiculopathy, lumbar region: Secondary | ICD-10-CM | POA: Diagnosis not present
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
## 2024-06-05 ENCOUNTER — Telehealth: Payer: Self-pay | Admitting: *Deleted

## 2024-06-05 NOTE — Patient Instructions (Signed)

## 2024-06-05 NOTE — Telephone Encounter (Signed)
 Given pre procedure instructions, instructed to stop Eliquis  3 days before procedure.

## 2024-06-10 ENCOUNTER — Ambulatory Visit: Admitting: Internal Medicine

## 2024-06-12 ENCOUNTER — Ambulatory Visit: Admitting: Student in an Organized Health Care Education/Training Program

## 2024-06-20 ENCOUNTER — Ambulatory Visit: Admitting: Nurse Practitioner

## 2024-06-24 ENCOUNTER — Ambulatory Visit: Admitting: Orthopedic Surgery

## 2024-07-29 ENCOUNTER — Encounter: Admitting: Nurse Practitioner
